# Patient Record
Sex: Female | Born: 1950
Health system: Southern US, Community
[De-identification: ages and names within clinical notes are randomized; demographics above are authoritative.]

## PROBLEM LIST (undated history)

## (undated) DIAGNOSIS — L659 Nonscarring hair loss, unspecified: Secondary | ICD-10-CM

## (undated) DIAGNOSIS — M778 Other enthesopathies, not elsewhere classified: Secondary | ICD-10-CM

## (undated) DIAGNOSIS — I6529 Occlusion and stenosis of unspecified carotid artery: Secondary | ICD-10-CM

## (undated) DIAGNOSIS — K219 Gastro-esophageal reflux disease without esophagitis: Secondary | ICD-10-CM

## (undated) DIAGNOSIS — Q2381 Bicuspid aortic valve: Secondary | ICD-10-CM

## (undated) DIAGNOSIS — Z853 Personal history of malignant neoplasm of breast: Secondary | ICD-10-CM

## (undated) DIAGNOSIS — I5032 Chronic diastolic (congestive) heart failure: Secondary | ICD-10-CM

## (undated) DIAGNOSIS — R011 Cardiac murmur, unspecified: Secondary | ICD-10-CM

## (undated) DIAGNOSIS — M25431 Effusion, right wrist: Secondary | ICD-10-CM

## (undated) DIAGNOSIS — E785 Hyperlipidemia, unspecified: Secondary | ICD-10-CM

## (undated) DIAGNOSIS — D649 Anemia, unspecified: Secondary | ICD-10-CM

## (undated) DIAGNOSIS — M25432 Effusion, left wrist: Secondary | ICD-10-CM

## (undated) DIAGNOSIS — Z171 Estrogen receptor negative status [ER-]: Secondary | ICD-10-CM

## (undated) DIAGNOSIS — R739 Hyperglycemia, unspecified: Secondary | ICD-10-CM

## (undated) DIAGNOSIS — I219 Acute myocardial infarction, unspecified: Secondary | ICD-10-CM

## (undated) DIAGNOSIS — M1990 Unspecified osteoarthritis, unspecified site: Secondary | ICD-10-CM

## (undated) DIAGNOSIS — E109 Type 1 diabetes mellitus without complications: Secondary | ICD-10-CM

## (undated) DIAGNOSIS — I10 Essential (primary) hypertension: Secondary | ICD-10-CM

## (undated) DIAGNOSIS — Z5189 Encounter for other specified aftercare: Secondary | ICD-10-CM

## (undated) DIAGNOSIS — I2510 Atherosclerotic heart disease of native coronary artery without angina pectoris: Secondary | ICD-10-CM

## (undated) DIAGNOSIS — I340 Nonrheumatic mitral (valve) insufficiency: Secondary | ICD-10-CM

## (undated) DIAGNOSIS — Q231 Congenital insufficiency of aortic valve: Secondary | ICD-10-CM

## (undated) DIAGNOSIS — E860 Dehydration: Secondary | ICD-10-CM

## (undated) HISTORY — PX: ELBOW SURGERY: SHX618

## (undated) HISTORY — DX: Occlusion and stenosis of unspecified carotid artery: I65.29

## (undated) HISTORY — DX: Hyperglycemia, unspecified: R73.9

## (undated) HISTORY — DX: Effusion, right wrist: M25.432

## (undated) HISTORY — DX: Congenital insufficiency of aortic valve: Q23.1

## (undated) HISTORY — PX: CHOLECYSTECTOMY: SHX55

## (undated) HISTORY — DX: Hyperlipidemia, unspecified: E78.5

## (undated) HISTORY — DX: Personal history of malignant neoplasm of breast: Z85.3

## (undated) HISTORY — DX: Other enthesopathies, not elsewhere classified: M77.8

## (undated) HISTORY — DX: Dehydration: E86.0

## (undated) HISTORY — PX: OTHER SURGICAL HISTORY: SHX169

## (undated) HISTORY — DX: Essential (primary) hypertension: I10

## (undated) HISTORY — PX: HAND SURGERY: SHX662

## (undated) HISTORY — PX: CATARACT EXTRACTION, BILATERAL: SHX1313

## (undated) HISTORY — DX: Effusion, right wrist: M25.431

## (undated) HISTORY — DX: Anemia, unspecified: D64.9

## (undated) HISTORY — DX: Estrogen receptor negative status (ER-): Z17.1

## (undated) HISTORY — DX: Bicuspid aortic valve: Q23.81

## (undated) HISTORY — DX: Cardiac murmur, unspecified: R01.1

## (undated) HISTORY — DX: Encounter for other specified aftercare: Z51.89

## (undated) HISTORY — PX: ANKLE SURGERY: SHX546

## (undated) HISTORY — DX: Nonrheumatic mitral (valve) insufficiency: I34.0

## (undated) HISTORY — DX: Nonscarring hair loss, unspecified: L65.9

## (undated) HISTORY — DX: Type 1 diabetes mellitus without complications: E10.9

## (undated) HISTORY — PX: PORTACATH PLACEMENT: SHX2246

## (undated) HISTORY — PX: PORT-A-CATH REMOVAL: SHX5289

## (undated) HISTORY — PX: CORONARY STENT INTERVENTION: CATH118234

---
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
## 2000-12-04 ENCOUNTER — Ambulatory Visit (HOSPITAL_BASED_OUTPATIENT_CLINIC_OR_DEPARTMENT_OTHER): Admission: RE | Admit: 2000-12-04 | Discharge: 2000-12-04 | Payer: Self-pay | Admitting: Orthopedic Surgery

## 2001-04-02 ENCOUNTER — Encounter: Payer: Self-pay | Admitting: Endocrinology

## 2001-04-02 ENCOUNTER — Encounter: Admission: RE | Admit: 2001-04-02 | Discharge: 2001-04-02 | Payer: Self-pay | Admitting: Endocrinology

## 2001-04-17 ENCOUNTER — Encounter: Payer: Self-pay | Admitting: General Surgery

## 2001-04-17 ENCOUNTER — Ambulatory Visit (HOSPITAL_COMMUNITY): Admission: RE | Admit: 2001-04-17 | Discharge: 2001-04-18 | Payer: Self-pay | Admitting: General Surgery

## 2001-04-17 ENCOUNTER — Encounter (INDEPENDENT_AMBULATORY_CARE_PROVIDER_SITE_OTHER): Payer: Self-pay | Admitting: *Deleted

## 2001-12-24 ENCOUNTER — Other Ambulatory Visit: Admission: RE | Admit: 2001-12-24 | Discharge: 2001-12-24 | Payer: Self-pay | Admitting: Obstetrics and Gynecology

## 2003-01-22 ENCOUNTER — Encounter: Admission: RE | Admit: 2003-01-22 | Discharge: 2003-01-22 | Payer: Self-pay | Admitting: Obstetrics and Gynecology

## 2003-01-22 ENCOUNTER — Encounter: Payer: Self-pay | Admitting: Obstetrics and Gynecology

## 2004-01-27 ENCOUNTER — Encounter: Admission: RE | Admit: 2004-01-27 | Discharge: 2004-01-27 | Payer: Self-pay | Admitting: Obstetrics and Gynecology

## 2005-06-28 ENCOUNTER — Encounter: Admission: RE | Admit: 2005-06-28 | Discharge: 2005-06-28 | Payer: Self-pay | Admitting: Endocrinology

## 2006-07-09 ENCOUNTER — Encounter: Admission: RE | Admit: 2006-07-09 | Discharge: 2006-07-09 | Payer: Self-pay | Admitting: Endocrinology

## 2009-12-30 ENCOUNTER — Encounter: Admission: RE | Admit: 2009-12-30 | Discharge: 2009-12-30 | Payer: Self-pay | Admitting: Endocrinology

## 2011-03-03 NOTE — Op Note (Signed)
[place]. [facility]  Patient:    Molly Hill, Molly Hill                       MRN: [id number] Proc. Date: 12/04/00 Adm. Date:  [id number] Attending:  Susa Day CC:         Alfonse Alpers. Dagoberto Ligas, M.D.   Operative Report  PREOPERATIVE DIAGNOSIS: 1. Entrapment neuropathy, median nerve, left carpal tunnel. 2. Left long finger A-1 pulley stenosing tenosynovitis.  POSTOPERATIVE DIAGNOSIS: 1. Entrapment neuropathy, median nerve, left carpal tunnel. 2. Left long finger A-1 pulley stenosing tenosynovitis.  OPERATION PERFORMED: 1. Release of left transverse carpal ligament. 2. Through separate incision, release of left long finger A-1 pulley.  OPERATING SURGEON:  Josephine Igo, M.D.  ASSISTANT:  Jonni Sanger, P.A.  ANESTHESIA:  IV regional.  ANESTHESIOLOGIST:  Dr. Krista Blue.  INDICATIONS:  Molly Hill is a 60 year old woman who has had insulin-dependent diabetes.  She was referred to Dr. Dagoberto Ligas for a consultation regarding chronic entrapment neuropathy symptoms of the left hand and stenosing tenosynovitis of the left long finger.  Due to failure of nonoperative measures, the patient is brought to the operating room at this time for release of her left transverse carpal ligament and release of her left long finger A-1 pulley.  DESCRIPTION OF PROCEDURE:  Molly Hill was brought to the operating room and placed in supine position on the operating table.  Following induction of general sedation, an IV regional block was placed.  When anesthesia was satisfactory, the left arm was prepped with Betadine soap and solution and sterilely draped.  The procedure commenced with a short transverse incision distal to the distal palmar creasein line overlying the A-1 pulley.  Subcutaneous tissues were carefully divided revealing the palmar fascia.  This was released with scissors followed by identification of the flexor sheath.  The neurovascular bundles  were gently retracted.  The A-1 pulley was released with scissors. Thereafter tendons were delivered and found to be moderately edematous.  Full range of motion of the fingers was recovered.  Attention was then directed to release of the median nerve.  A separate 15 mm incision was fashioned in line with the ring finger in the proximal palm. Subcutaneous tissues were carefully divided revealing the palmar fascia.  This was split longitudinally to reveal the common sensory branch of the median nerve.  These were followed back to the transverse carpal ligament which was carefully separated from the median nerve.  The ligament was released frozen section its ulnar border extending into the distal forearm.  This widely opened the carpal canal.  No masses or other predicaments were noted.  Bleeding points along the margin of the released ligament were electrocauterized with bipolar current followed by repair of the skin with intradermal 3-0 Prolene suture.  The A-1 pulley release incision was likewise repaired with intradermal 3-0 Prolene.  A compressive dressing was applied with Xeroflo, sterile gauze and a volar plaster splint was placed protecting the wrist.  There were no apparent complications. DD:  12/04/00 TD:  12/04/00 Job: [id number]

## 2011-03-03 NOTE — Op Note (Signed)
[place]. [facility]  Patient:    OSHA, RANE                       MRN: [id number] Proc. Date: 04/17/01 Adm. Date:  [id number] Attending:  Tempie Donning CC:         Alfonse Alpers. Dagoberto Ligas, M.D.   Operative Report  PROCEDURE:  Laparoscopic cholecystectomy.  SURGEON:  Gita Kudo, M.D.  ASSISTANT:  Currie Paris, M.D.  ANESTHESIA:  General endotracheal.  PREOPERATIVE DIAGNOSIS:  Cholelithiasis.  POSTOPERATIVE DIAGNOSIS:  Cholelithiasis.  CLINICAL SUMMARY:  60 year old diabetic female with a bout of right upper quadrant abdominal pain.  Gallbladder ultrasound showed stones, but otherwise normal and no evidence of acute infection.  Liver function studies are normal. She comes in for elective cholecystectomy.  OPERATIVE FINDINGS:  The gallbladder had some adhesions around it, signifying she has had problems in the past.  The cystic duct and artery were normal in size and anatomy.  The gallbladder was thin-walled and had multiple palpable small stones - felt after it was removed.  OPERATIVE PROCEDURE:  Under satisfactory general endotracheal anesthesia, having received 1.0 gram Ancef preoperatively, the patients abdomen was prepped and draped in a standard fashion.  A transverse incision was made above the umbilicus and the midline opened into the peritoneum.  Controlled with a figure-of-eight 0 Vicryl and Hasson operating port inserted and secured.  Good CO2 pneumoperitoneum established and camera placed.  Under direct vision, through Marcaine infiltrated skin sites, two #5 ports were placed laterally and a second #10 port medically.  With graspers in the lateral port giving excellent exposure, I took down the adhesions operating through the medial port using the coagulating dissector.  Then the cystic duct and artery were each carefully circumferentially dissected with a right angle clamp and when certain of the anatomy, controlled  with multiple metal clips and divided between the distal two.  The gallbladder was then removed from below upward using the coagulating spatula for hemostasis and dissection. After removing it from the liver bed, the bed was made dry by cautery, lavaged with saline, and suctioned dry.  The camera was then moved to the upper port and then grasper placed in the umbilical port used to retrieve the gallbladder intact without any spillage or complication.  After it was removed, the operative site was checked for hemostasis and then all ports and CO2 were released.  The midline incision was then infiltrated with Marcaine and closed with the previous suture and a second interrupted 0 Vicryl suture.  Subcu approximated with 4-0 Vicryl and skin edges with Steri-Strips.  Sterile dressings applied and the patient to the recovery room from the operating in good condition. DD:  04/17/01 TD:  04/17/01 Job: [id number]

## 2011-10-17 DIAGNOSIS — I2510 Atherosclerotic heart disease of native coronary artery without angina pectoris: Secondary | ICD-10-CM | POA: Insufficient documentation

## 2011-10-17 HISTORY — DX: Atherosclerotic heart disease of native coronary artery without angina pectoris: I25.10

## 2019-04-23 ENCOUNTER — Other Ambulatory Visit (HOSPITAL_COMMUNITY): Payer: Self-pay | Admitting: Family Medicine

## 2019-04-23 DIAGNOSIS — R011 Cardiac murmur, unspecified: Secondary | ICD-10-CM

## 2019-04-28 ENCOUNTER — Ambulatory Visit (HOSPITAL_COMMUNITY): Admission: RE | Admit: 2019-04-28 | Payer: Medicare PPO | Source: Ambulatory Visit

## 2019-04-29 ENCOUNTER — Ambulatory Visit (HOSPITAL_COMMUNITY)
Admission: RE | Admit: 2019-04-29 | Discharge: 2019-04-29 | Disposition: A | Discharge status: Home / Self Care | Payer: Medicare PPO | Source: Ambulatory Visit | Attending: Family Medicine | Admitting: Family Medicine

## 2019-04-29 ENCOUNTER — Other Ambulatory Visit: Payer: Self-pay

## 2019-04-29 DIAGNOSIS — I080 Rheumatic disorders of both mitral and aortic valves: Secondary | ICD-10-CM | POA: Diagnosis not present

## 2019-04-29 DIAGNOSIS — R011 Cardiac murmur, unspecified: Secondary | ICD-10-CM | POA: Diagnosis present

## 2019-04-29 NOTE — Progress Notes (Signed)
  Echocardiogram 2D Echocardiogram has been performed.  Randa Lynn Saivon Prowse 04/29/2019, 12:32 PM

## 2019-05-08 ENCOUNTER — Telehealth: Payer: Self-pay

## 2019-05-08 NOTE — Telephone Encounter (Signed)
NOTES ON FILE FROM DR Sutton, NOTES ON FILE

## 2019-05-21 ENCOUNTER — Telehealth: Payer: Self-pay

## 2019-05-21 NOTE — Telephone Encounter (Signed)
NOTES ON FILE FROM MICHAEL SCOTT MCLEOD [phone] SENT REFERRAL TO [place]

## 2019-07-09 NOTE — Progress Notes (Signed)
Cardiology Office Note    Date:  07/10/2019   ID:  Molly Hill, DOB 06-Oct-1951, MRN [id number]  PCP:  Allean Found, MD  Cardiologist:  Fransico Him, MD   Chief Complaint  Patient presents with  . New Patient (Initial Visit)    MR, BAV with mild to moderate AR and mild AS    History of Present Illness:  Molly Hill is a 68 y.o. female who is being seen today for the evaluation of mitral regurgitation at the request of Allean Found, MD.  This is a pleasant 68yo female with a hx of HTN, HLD, ASCAD s/p remote PCI of LAD in 2013 and heart murmur.  Her PCP recently ordered an echo to followup on her murmur showing normal LVF with EF 60-65% with G2DD, mild LAE, degenerative thickening of the MV with mild to moderate MR and bicuspid AV with mild to moderate AR and mild AS.    She denies any anginal chest pain or pressure,  PND, orthopnea, LE edema, dizziness, palpitations or syncope. She says that ever since her breast surgery she gets an occasional sharp pain in her chest lasting < 15 seconds with no associated sx.  She does get DOE when walking up steep inclines that is a chronic problem and has not changed recently.  She is compliant with her meds and is tolerating meds with no SE.   Past Medical History:  Diagnosis Date  . Alopecia   . Anemia   . Bicuspid aortic valve    mild to moderate AR and mild AS by echo 2020  . Carotid bruit   . Convalescence after chemotherapy   . Coronary arteriosclerosis in native artery 2013   s/p PCI of LAD  . DM (diabetes mellitus), type 1 ([place])   . Estrogen receptor negative   . Hyperglycemia   . Hyperlipidemia   . Hypertension   . Mild dehydration   . Mitral regurgitation    mild to moderate by echo 2020  . Murmur, heart   . Swelling of joint of both wrists   . Tendinitis of left wrist       Current Medications: Current Meds  Medication Sig  . aspirin EC 81 MG tablet Take 81 mg by mouth daily.  Marland Kitchen atorvastatin (LIPITOR) 40  MG tablet Take 40 mg by mouth daily.  . Biotin w/ Vitamins C & E (HAIR/SKIN/NAILS PO) Take by mouth.  . Calcium Carb-Cholecalciferol (CALCIUM 600 + D PO) Take by mouth.  . docusate sodium (COLACE) 100 MG capsule Take 100 mg by mouth daily as needed for mild constipation.  . fexofenadine (ALLEGRA) 180 MG tablet Take 180 mg by mouth daily.  . fluticasone (FLONASE) 50 MCG/ACT nasal spray Place into both nostrils daily.  Marland Kitchen glucose blood test strip 1 each by Other route as needed for other. Use as instructed  . insulin NPH Human (NOVOLIN N) 100 UNIT/ML injection Inject into the skin.  Marland Kitchen insulin regular (NOVOLIN R) 100 units/mL injection Inject 100 Units into the skin 3 (three) times daily before meals.  Marland Kitchen loperamide (IMODIUM) 1 MG/5ML solution Take by mouth as needed for diarrhea or loose stools.  . Metoprolol Succinate 25 MG CS24 Take 25 mg by mouth.  . nitroGLYCERIN (NITROSTAT) 0.4 MG SL tablet Place 0.4 mg under the tongue every 5 (five) minutes as needed for chest pain.  . ramipril (ALTACE) 10 MG capsule Take 10 mg by mouth daily.  . traZODone (DESYREL) 50 MG tablet Take  50 mg by mouth 3 (three) times daily.    Allergies:   Codeine and Reglan [metoclopramide]   Social History   Socioeconomic History  . Marital status: Married    Spouse name: Not on file  . Number of children: Not on file  . Years of education: Not on file  . Highest education level: Not on file  Occupational History  . Not on file  Social Needs  . Financial resource strain: Not on file  . Food insecurity    Worry: Not on file    Inability: Not on file  . Transportation needs    Medical: Not on file    Non-medical: Not on file  Tobacco Use  . Smoking status: Former Research scientist (life sciences)  . Smokeless tobacco: Never Used  Substance and Sexual Activity  . Alcohol use: Never    Frequency: Never  . Drug use: Never  . Sexual activity: Never  Lifestyle  . Physical activity    Days per week: Not on file    Minutes per session:  Not on file  . Stress: Not on file  Relationships  . Social Herbalist on phone: Not on file    Gets together: Not on file    Attends religious service: Not on file    Active member of club or organization: Not on file    Attends meetings of clubs or organizations: Not on file    Relationship status: Not on file  Other Topics Concern  . Not on file  Social History Narrative  . Not on file     Family History:  The patient's family history includes CAD in her father; Diabetes in her brother, father, and paternal grandfather; Heart attack in her mother and paternal grandfather; Stroke in her father.   ROS:   Please see the history of present illness.    ROS All other systems reviewed and are negative.  No flowsheet data found.     PHYSICAL EXAM:   VS:  BP (!) 183/71   Pulse 66   Ht 5' (1.524 m)   Wt 146 lb (66.2 kg)   BMI 28.51 kg/m    GEN: Well nourished, well developed, in no acute distress  HEENT: normal  Neck: no JVD, carotid bruits, or masses Cardiac: RRR; no murmurs, rubs, or gallops,no edema.  Intact distal pulses bilaterally.  Respiratory:  clear to auscultation bilaterally, normal work of breathing GI: soft, nontender, nondistended, + BS MS: no deformity or atrophy  Skin: warm and dry, no Duran Neuro:  Alert and Oriented x 3, Strength and sensation are intact Psych: euthymic mood, full affect  Wt Readings from Last 3 Encounters:  07/10/19 146 lb (66.2 kg)      Studies/Labs Reviewed:   EKG:  EKG is not ordered today.    Recent Labs: No results found for requested labs within last 8760 hours.   Lipid Panel No results found for: CHOL, TRIG, HDL, CHOLHDL, VLDL, LDLCALC, LDLDIRECT  Additional studies/ records that were reviewed today include:  2D echo     ASSESSMENT:    1. Nonrheumatic mitral valve regurgitation   2. Bicuspid aortic valve   3.      ASCAD 4.      HLD 5.      HTN 6.      Left carotid bruit   PLAN:  In order of  problems listed above:  1.  Mitral regurgitation -2D echo showed mild to moderate MR -LV size and  function are normal -will follow with yearly echo  2.  Bicuspid AV -echo showed mild AS and mild to moderate AR -will check Chest CTA to rule out associated aortic aneurysms which can be seen with BAV -follow with yearly echo to assess for stability of AR and AS -LV dimensions and LVF are normal.   3.  ASCAD -s/p PCI of LAD in 2013 -continue ASA 81mg  daily, statin, BB  4.  Hyperlipidemia -LDL goal is < 70 -continue atorvastatin 40mg  daily -I will get a copy of last FLP and ALT from PCP  5.  HTN -Bp controlled on exam -continue Toprol XL 25mg  daily and Altace 10mg  daily  6.  Left carotid artery bruit -this does not appear to emanate from her AV -check carotid dopplers    Medication Adjustments/Labs and Tests Ordered: Current medicines are reviewed at length with the patient today.  Concerns regarding medicines are outlined above.  Medication changes, Labs and Tests ordered today are listed in the Patient Instructions below.  Patient Instructions  Medication Instructions:  Your physician recommends that you continue on your current medications as directed. Please refer to the Current Medication list given to you today.  If you need a refill on your cardiac medications before your next appointment, please call your pharmacy.   Lab work: Your physician recommends that you return for lab work If you have labs (blood work) drawn today and your tests are completely normal, you will receive your results only by: Marland Kitchen MyChart Message (if you have MyChart) OR . A paper copy in the mail If you have any lab test that is abnormal or we need to change your treatment, we will call you to review the results.  Testing/Procedures: Your physician has requested that you have a carotid duplex. This test is an ultrasound of the carotid arteries in your neck. It looks at blood flow through these  arteries that supply the brain with blood. Allow one hour for this exam. There are no restrictions or special instructions.  Your physician has requested that you have an echocardiogram. Echocardiography is a painless test that uses sound waves to create images of your heart. It provides your doctor with information about the size and shape of your heart and how well your heart's chambers and valves are working. This procedure takes approximately one hour. There are no restrictions for this procedure.  Non-Cardiac CT Angiography (CTA), is a special type of CT scan that uses a computer to produce multi-dimensional views of major blood vessels throughout the body. In CT angiography, a contrast material is injected through an IV to help visualize the blood vessels  Follow-Up: At [facility], you and your health needs are our priority.  As part of our continuing mission to provide you with exceptional heart care, we have created designated Provider Care Teams.  These Care Teams include your primary Cardiologist (physician) and Advanced Practice Providers (APPs -  Physician Assistants and Nurse Practitioners) who all work together to provide you with the care you need, when you need it. You will need a follow up appointment in 1 years.  Please call our office 2 months in advance to schedule this appointment.  You may see Dr. Radford Pax or one of the following Advanced Practice Providers on your designated Care Team:   [place], PA-C Melina Copa, PA-C . Ermalinda Barrios, PA-C  Any Other Special Instructions Will Be Listed [place] Echocardiogram An echocardiogram is a procedure that uses painless sound waves (ultrasound) to  produce an image of the heart. Images from an echocardiogram can provide important information about:  Signs of coronary artery disease (CAD).  Aneurysm detection. An aneurysm is a weak or damaged part of an artery wall that bulges out from the normal force of blood pumping through  the body.  Heart size and shape. Changes in the size or shape of the heart can be associated with certain conditions, including heart failure, aneurysm, and CAD.  Heart muscle function.  Heart valve function.  Signs of a past heart attack.  Fluid buildup around the heart.  Thickening of the heart muscle.  A tumor or infectious growth around the heart valves. Tell a health care provider about:  Any allergies you have.  All medicines you are taking, including vitamins, herbs, eye drops, creams, and over-the-counter medicines.  Any blood disorders you have.  Any surgeries you have had.  Any medical conditions you have.  Whether you are pregnant or may be pregnant. What are the risks? Generally, this is a safe procedure. However, problems may occur, including:  Allergic reaction to dye (contrast) that may be used during the procedure. What happens before the procedure? No specific preparation is needed. You may eat and drink normally. What happens during the procedure?   An IV tube may be inserted into one of your veins.  You may receive contrast through this tube. A contrast is an injection that improves the quality of the pictures from your heart.  A gel will be applied to your chest.  A wand-like tool (transducer) will be moved over your chest. The gel will help to transmit the sound waves from the transducer.  The sound waves will harmlessly bounce off of your heart to allow the heart images to be captured in real-time motion. The images will be recorded on a computer. The procedure may vary among health care providers and hospitals. What happens after the procedure?  You may return to your normal, everyday life, including diet, activities, and medicines, unless your health care provider tells you not to do that. Summary  An echocardiogram is a procedure that uses painless sound waves (ultrasound) to produce an image of the heart.  Images from an echocardiogram  can provide important information about the size and shape of your heart, heart muscle function, heart valve function, and fluid buildup around your heart.  You do not need to do anything to prepare before this procedure. You may eat and drink normally.  After the echocardiogram is completed, you may return to your normal, everyday life, unless your health care provider tells you not to do that. This information is not intended to replace advice given to you by your health care provider. Make sure you discuss any questions you have with your health care provider. Document Released: 09/29/2000 Document Revised: 01/23/2019 Document Reviewed: 11/04/2016 Elsevier Patient Education  2020 [place] (If Applicable).         Signed, Fransico Him, MD  07/10/2019 11:02 AM    [facility] [address], [address], [place]  [address] Phone: [phone]; Fax: [phone]

## 2019-07-10 ENCOUNTER — Encounter: Payer: Self-pay | Admitting: Cardiology

## 2019-07-10 ENCOUNTER — Ambulatory Visit (INDEPENDENT_AMBULATORY_CARE_PROVIDER_SITE_OTHER): Payer: Medicare PPO | Admitting: Cardiology

## 2019-07-10 ENCOUNTER — Other Ambulatory Visit: Payer: Self-pay

## 2019-07-10 ENCOUNTER — Telehealth: Payer: Self-pay

## 2019-07-10 VITALS — BP 183/71 | HR 66 | Ht 60.0 in | Wt 146.0 lb

## 2019-07-10 DIAGNOSIS — R0989 Other specified symptoms and signs involving the circulatory and respiratory systems: Secondary | ICD-10-CM | POA: Diagnosis not present

## 2019-07-10 DIAGNOSIS — Q231 Congenital insufficiency of aortic valve: Secondary | ICD-10-CM | POA: Diagnosis not present

## 2019-07-10 DIAGNOSIS — I350 Nonrheumatic aortic (valve) stenosis: Secondary | ICD-10-CM | POA: Diagnosis not present

## 2019-07-10 DIAGNOSIS — I340 Nonrheumatic mitral (valve) insufficiency: Secondary | ICD-10-CM | POA: Diagnosis not present

## 2019-07-10 DIAGNOSIS — Q254 Congenital malformation of aorta unspecified: Secondary | ICD-10-CM

## 2019-07-10 DIAGNOSIS — I2510 Atherosclerotic heart disease of native coronary artery without angina pectoris: Secondary | ICD-10-CM

## 2019-07-10 NOTE — Patient Instructions (Addendum)
Medication Instructions:  Your physician recommends that you continue on your current medications as directed. Please refer to the Current Medication list given to you today.  If you need a refill on your cardiac medications before your next appointment, please call your pharmacy.   Lab work: Your physician recommends that you return for lab work within 1 week of Cardiac CTA If you have labs (blood work) drawn today and your tests are completely normal, you will receive your results only by:  [place] (if you have MyChart) OR  A paper copy in the mail If you have any lab test that is abnormal or we need to change your treatment, we will call you to review the results.  Testing/Procedures: Your physician has requested that you have a carotid duplex. This test is an ultrasound of the carotid arteries in your neck. It looks at blood flow through these arteries that supply the brain with blood. Allow one hour for this exam. There are no restrictions or special instructions.  Your physician has requested that you have an echocardiogram. Echocardiography is a painless test that uses sound waves to create images of your heart. It provides your doctor with information about the size and shape of your heart and how well your hearts chambers and valves are working. This procedure takes approximately one hour. There are no restrictions for this procedure.  Non-Cardiac CT Angiography (CTA), is a special type of CT scan that uses a computer to produce multi-dimensional views of major blood vessels throughout the body. In CT angiography, a contrast material is injected through an IV to help visualize the blood vessels  Follow-Up: At [facility], you and your health needs are our priority.  As part of our continuing mission to provide you with exceptional heart care, we have created designated Provider Care Teams.  These Care Teams include your primary Cardiologist (physician) and Advanced Practice  Providers (APPs -  Physician Assistants and Nurse Practitioners) who all work together to provide you with the care you need, when you need it. You will need a follow up appointment in 1 years.  Please call our office 2 months in advance to schedule this appointment.  You may see Dr. Radford Pax or one of the following Advanced Practice Providers on your designated Care Team:   Lyda Jester, PA-C Dayna Dunn, PA-C  Ermalinda Barrios, PA-C  Any Other Special Instructions Will Be [facility] Echocardiogram An echocardiogram is a procedure that uses painless sound waves (ultrasound) to produce an image of the heart. Images from an echocardiogram can provide important information about:  Signs of coronary artery disease (CAD).  Aneurysm detection. An aneurysm is a weak or damaged part of an artery wall that bulges out from the normal force of blood pumping through the body.  Heart size and shape. Changes in the size or shape of the heart can be associated with certain conditions, including heart failure, aneurysm, and CAD.  Heart muscle function.  Heart valve function.  Signs of a past heart attack.  Fluid buildup around the heart.  Thickening of the heart muscle.  A tumor or infectious growth around the heart valves. Tell a health care provider about:  Any allergies you have.  All medicines you are taking, including vitamins, herbs, eye drops, creams, and over-the-counter medicines.  Any blood disorders you have.  Any surgeries you have had.  Any medical conditions you have.  Whether you are pregnant or may be pregnant. What are the risks? Generally, this  is a safe procedure. However, problems may occur, including:  Allergic reaction to dye (contrast) that may be used during the procedure. What happens before the procedure? No specific preparation is needed. You may eat and drink normally. What happens during the procedure?   An IV tube may be inserted into one of your  veins.  You may receive contrast through this tube. A contrast is an injection that improves the quality of the pictures from your heart.  A gel will be applied to your chest.  A wand-like tool (transducer) will be moved over your chest. The gel will help to transmit the sound waves from the transducer.  The sound waves will harmlessly bounce off of your heart to allow the heart images to be captured in real-time motion. The images will be recorded on a computer. The procedure may vary among health care providers and hospitals. What happens after the procedure?  You may return to your normal, everyday life, including diet, activities, and medicines, unless your health care provider tells you not to do that. Summary  An echocardiogram is a procedure that uses painless sound waves (ultrasound) to produce an image of the heart.  Images from an echocardiogram can provide important information about the size and shape of your heart, heart muscle function, heart valve function, and fluid buildup around your heart.  You do not need to do anything to prepare before this procedure. You may eat and drink normally.  After the echocardiogram is completed, you may return to your normal, everyday life, unless your health care provider tells you not to do that. This information is not intended to replace advice given to you by your health care provider. Make sure you discuss any questions you have with your health care provider. Document Released: 09/29/2000 Document Revised: 01/23/2019 Document Reviewed: 11/04/2016 Elsevier Patient Education  2020 [place] (If Applicable).

## 2019-07-10 NOTE — Telephone Encounter (Signed)
Requested copy of pt's most recent  labwork be faxed to our office. Most recent labwork from 04/2019 will be sent/faxed to our office.

## 2019-07-23 ENCOUNTER — Other Ambulatory Visit: Payer: Self-pay

## 2019-07-23 ENCOUNTER — Telehealth: Payer: Self-pay

## 2019-07-23 ENCOUNTER — Encounter: Payer: Self-pay | Admitting: Cardiology

## 2019-07-23 ENCOUNTER — Ambulatory Visit (HOSPITAL_COMMUNITY)
Admission: RE | Admit: 2019-07-23 | Discharge: 2019-07-23 | Disposition: A | Discharge status: Home / Self Care | Payer: Medicare PPO | Source: Ambulatory Visit | Attending: Cardiology | Admitting: Cardiology

## 2019-07-23 DIAGNOSIS — R0989 Other specified symptoms and signs involving the circulatory and respiratory systems: Secondary | ICD-10-CM | POA: Diagnosis present

## 2019-07-23 DIAGNOSIS — I6523 Occlusion and stenosis of bilateral carotid arteries: Secondary | ICD-10-CM

## 2019-07-23 DIAGNOSIS — I6529 Occlusion and stenosis of unspecified carotid artery: Secondary | ICD-10-CM | POA: Insufficient documentation

## 2019-07-23 NOTE — Telephone Encounter (Signed)
-----   Message from Sueanne Margarita, MD sent at 07/23/2019 10:52 AM EDT ----- Please let patient know that dopplers showed 1-39% stenosis - repeat study in 2 years.  Continue ASA and statin

## 2019-07-23 NOTE — Telephone Encounter (Signed)
Notes recorded by Frederik Schmidt, RN on 07/23/2019 at 11:06 AM EDT  The patient has been notified of the Carotid US result and verbalized understanding. All questions (if any) were answered.  Frederik Schmidt, RN 07/23/2019 11:06 AM

## 2019-07-29 ENCOUNTER — Other Ambulatory Visit: Payer: Medicare PPO

## 2019-07-29 ENCOUNTER — Other Ambulatory Visit: Payer: Self-pay

## 2019-07-29 LAB — BASIC METABOLIC PANEL
BUN/Creatinine Ratio: 20 (ref 12–28)
BUN: 21 mg/dL (ref 8–27)
CO2: 23 mmol/L (ref 20–29)
Calcium: 9.2 mg/dL (ref 8.7–10.3)
Chloride: 97 mmol/L (ref 96–106)
Creatinine, Ser: 1.07 mg/dL — ABNORMAL HIGH (ref 0.57–1.00)
GFR calc Af Amer: 62 mL/min/{1.73_m2} (ref >59)
GFR calc non Af Amer: 53 mL/min/{1.73_m2} — ABNORMAL LOW (ref >59)
Glucose: 285 mg/dL — ABNORMAL HIGH (ref 65–99)
Potassium: 4.9 mmol/L (ref 3.5–5.2)
Sodium: 136 mmol/L (ref 134–144)

## 2019-08-05 ENCOUNTER — Other Ambulatory Visit: Payer: Self-pay

## 2019-08-05 ENCOUNTER — Telehealth: Payer: Self-pay | Admitting: Cardiology

## 2019-08-05 ENCOUNTER — Ambulatory Visit (INDEPENDENT_AMBULATORY_CARE_PROVIDER_SITE_OTHER)
Admission: RE | Admit: 2019-08-05 | Discharge: 2019-08-05 | Disposition: A | Discharge status: Home / Self Care | Payer: Medicare PPO | Source: Ambulatory Visit | Attending: Cardiology | Admitting: Cardiology

## 2019-08-05 DIAGNOSIS — Q254 Congenital malformation of aorta unspecified: Secondary | ICD-10-CM

## 2019-08-05 DIAGNOSIS — Q231 Congenital insufficiency of aortic valve: Secondary | ICD-10-CM | POA: Diagnosis not present

## 2019-08-05 MED ORDER — IOHEXOL 350 MG/ML SOLN
100.0000 mL | Freq: Once | INTRAVENOUS | Status: AC | PRN
  Administered 2019-08-05: 100 mL via INTRAVENOUS

## 2019-08-05 NOTE — Telephone Encounter (Signed)
New message   Patient is returning call for CT Angio test results. Please call.

## 2019-08-05 NOTE — Telephone Encounter (Signed)
Pt has been notified of CT results by phone with verbal understanding. Pt aware to f/u with PCP in regards to 25mm pulmonary nodule in RUL. I will fax results to PCP Dr. Allean Found. The patient has been notified of the result and verbalized understanding.  All questions (if any) were answered. Julaine Hua, [place] 08/05/2019 11:53 AM

## 2019-09-04 ENCOUNTER — Emergency Department (HOSPITAL_COMMUNITY)
Admission: EM | Admit: 2019-09-04 | Discharge: 2019-09-05 | Disposition: A | Discharge status: Home / Self Care | Payer: Medicare PPO | Attending: Emergency Medicine | Admitting: Emergency Medicine

## 2019-09-04 ENCOUNTER — Emergency Department (HOSPITAL_COMMUNITY): Payer: Medicare PPO

## 2019-09-04 DIAGNOSIS — R0602 Shortness of breath: Secondary | ICD-10-CM | POA: Insufficient documentation

## 2019-09-04 DIAGNOSIS — E162 Hypoglycemia, unspecified: Secondary | ICD-10-CM

## 2019-09-04 DIAGNOSIS — I2510 Atherosclerotic heart disease of native coronary artery without angina pectoris: Secondary | ICD-10-CM | POA: Diagnosis not present

## 2019-09-04 DIAGNOSIS — R079 Chest pain, unspecified: Secondary | ICD-10-CM

## 2019-09-04 DIAGNOSIS — E11649 Type 2 diabetes mellitus with hypoglycemia without coma: Secondary | ICD-10-CM | POA: Insufficient documentation

## 2019-09-04 DIAGNOSIS — Z7982 Long term (current) use of aspirin: Secondary | ICD-10-CM | POA: Insufficient documentation

## 2019-09-04 DIAGNOSIS — I10 Essential (primary) hypertension: Secondary | ICD-10-CM | POA: Insufficient documentation

## 2019-09-04 DIAGNOSIS — Z87891 Personal history of nicotine dependence: Secondary | ICD-10-CM | POA: Diagnosis not present

## 2019-09-04 DIAGNOSIS — R0789 Other chest pain: Secondary | ICD-10-CM | POA: Insufficient documentation

## 2019-09-04 DIAGNOSIS — Z79899 Other long term (current) drug therapy: Secondary | ICD-10-CM | POA: Insufficient documentation

## 2019-09-04 DIAGNOSIS — Z794 Long term (current) use of insulin: Secondary | ICD-10-CM | POA: Insufficient documentation

## 2019-09-04 LAB — CBC WITH DIFFERENTIAL/PLATELET
Abs Immature Granulocytes: 0.06 10*3/uL (ref 0.00–0.07)
Basophils Absolute: 0 10*3/uL (ref 0.0–0.1)
Basophils Relative: 0 %
Eosinophils Absolute: 0.1 10*3/uL (ref 0.0–0.5)
Eosinophils Relative: 1 %
HCT: 40.2 % (ref 36.0–46.0)
Hemoglobin: 13.3 g/dL (ref 12.0–15.0)
Immature Granulocytes: 0 %
Lymphocytes Relative: 4 %
Lymphs Abs: 0.6 10*3/uL — ABNORMAL LOW (ref 0.7–4.0)
MCH: 31.5 pg (ref 26.0–34.0)
MCHC: 33.1 g/dL (ref 30.0–36.0)
MCV: 95.3 fL (ref 80.0–100.0)
Monocytes Absolute: 1 10*3/uL (ref 0.1–1.0)
Monocytes Relative: 7 %
Neutro Abs: 13 10*3/uL — ABNORMAL HIGH (ref 1.7–7.7)
Neutrophils Relative %: 88 %
Platelets: 192 10*3/uL (ref 150–400)
RBC: 4.22 MIL/uL (ref 3.87–5.11)
RDW: 12.2 % (ref 11.5–15.5)
WBC: 14.8 10*3/uL — ABNORMAL HIGH (ref 4.0–10.5)
nRBC: 0 % (ref 0.0–0.2)

## 2019-09-04 LAB — COMPREHENSIVE METABOLIC PANEL
ALT: 57 U/L — ABNORMAL HIGH (ref 0–44)
AST: 152 U/L — ABNORMAL HIGH (ref 15–41)
Albumin: 4.2 g/dL (ref 3.5–5.0)
Alkaline Phosphatase: 90 U/L (ref 38–126)
Anion gap: 8 (ref 5–15)
BUN: 27 mg/dL — ABNORMAL HIGH (ref 8–23)
CO2: 29 mmol/L (ref 22–32)
Calcium: 9.1 mg/dL (ref 8.9–10.3)
Chloride: 100 mmol/L (ref 98–111)
Creatinine, Ser: 0.99 mg/dL (ref 0.44–1.00)
GFR calc Af Amer: >60 mL/min (ref >60)
GFR calc non Af Amer: 58 mL/min — ABNORMAL LOW (ref >60)
Glucose, Bld: 80 mg/dL (ref 70–99)
Potassium: 4 mmol/L (ref 3.5–5.1)
Sodium: 137 mmol/L (ref 135–145)
Total Bilirubin: 0.9 mg/dL (ref 0.3–1.2)
Total Protein: 7.3 g/dL (ref 6.5–8.1)

## 2019-09-04 LAB — CBG MONITORING, ED: Glucose-Capillary: 111 mg/dL — ABNORMAL HIGH (ref 70–99)

## 2019-09-04 LAB — LIPASE, BLOOD: Lipase: 246 U/L — ABNORMAL HIGH (ref 11–51)

## 2019-09-04 LAB — TROPONIN I (HIGH SENSITIVITY): Troponin I (High Sensitivity): 12 ng/L (ref <18)

## 2019-09-04 NOTE — ED Triage Notes (Signed)
Pt came in [facility] c/o of hypoglycemia. When EMS arrived, BGL read "low" and 1g D10 was given. Rechecked BGL showed 302. Pt is A/ox4. Pt experienced 2 episodes of vomiting in route. Complained of CP and SOB in route.  Last BGL obtained =192 72HR, 138/72, 94%RA

## 2019-09-04 NOTE — ED Provider Notes (Addendum)
[facility] EMERGENCY DEPARTMENT Provider Note   CSN: [id number] Arrival date & time: 09/04/19  2122     History   Chief Complaint Chief Complaint  Patient presents with  . Hypoglycemia    HPI Molly Hill is a 68 y.o. female with past medical history of insulin-dependent diabetes, hypertension, CAD, who presents today for evaluation of hypoglycemia.  When EMS arrived her blood sugar simply read low and she was given 1 g of D10.  Recheck blood glucose level was 203.  After this patient had 2 episodes of chest pain, shortness of breath with pressure and vomiting.  She reports that this has resolved at this time.  She states that she had to fast overnight last night however ate a high carb lunch.  She had not yet eaten dinner or giving herself her insulin.  She does not have a clear cause for why she became hypoglycemic.  She states that she did not accidentally take insulin more than needed or did not take insulin without eating.        HPI  Past Medical History:  Diagnosis Date  . Alopecia   . Anemia   . Bicuspid aortic valve    mild to moderate AR and mild AS by echo 2020  . Carotid stenosis    1-39% stenosis bilaterally by dopplers 07/2019  . Convalescence after chemotherapy   . Coronary arteriosclerosis in native artery 2013   s/p PCI of LAD  . DM (diabetes mellitus), type 1 ([place])   . Estrogen receptor negative   . Hyperglycemia   . Hyperlipidemia   . Hypertension   . Mild dehydration   . Mitral regurgitation    mild to moderate by echo 2020  . Murmur, heart   . Swelling of joint of both wrists   . Tendinitis of left wrist     Patient Active Problem List   Diagnosis Date Noted  . Carotid stenosis   . Mitral regurgitation   . Bicuspid aortic valve   . Carotid bruit   . Coronary arteriosclerosis in native artery 2013    Past Surgical History:  Procedure Laterality Date  . ANKLE SURGERY Bilateral   . CATARACT EXTRACTION, BILATERAL     . CHOLECYSTECTOMY    . CORONARY STENT INTERVENTION    . CTR    . ELBOW SURGERY    . HAND SURGERY     BOTH THUMBS AND MIDDLE FINGERS  . LEFT BREAST MASTECTOMY    . MASCULAR  REPAIR    . PORT-A-CATH REMOVAL    . PORTACATH PLACEMENT    . RIGHT MODIFIED MASTECTOMY Right   . UPPER LIP SURGERY       OB History   No obstetric history on file.      Home Medications    Prior to Admission medications   Medication Sig Start Date End Date Taking? Authorizing Provider  aspirin EC 81 MG tablet Take 81 mg by mouth daily.    [provider]  atorvastatin (LIPITOR) 40 MG tablet Take 40 mg by mouth daily.    [provider]  Biotin w/ Vitamins C & E (HAIR/SKIN/NAILS PO) Take by mouth.    [provider]  Calcium Carb-Cholecalciferol (CALCIUM 600 + D PO) Take by mouth.    [provider]  docusate sodium (COLACE) 100 MG capsule Take 100 mg by mouth daily as needed for mild constipation.    [provider]  fexofenadine (ALLEGRA) 180 MG tablet Take  180 mg by mouth daily.    [provider]  fluticasone (FLONASE) 50 MCG/ACT nasal spray Place into both nostrils daily.    [provider]  glucose blood test strip 1 each by Other route as needed for other. Use as instructed    [provider]  insulin NPH Human (NOVOLIN N) 100 UNIT/ML injection Inject into the skin.    [provider]  insulin regular (NOVOLIN R) 100 units/mL injection Inject 100 Units into the skin 3 (three) times daily before meals.    [provider]  loperamide (IMODIUM) 1 MG/5ML solution Take by mouth as needed for diarrhea or loose stools.    [provider]  Metoprolol Succinate 25 MG CS24 Take 25 mg by mouth.    [provider]  nitroGLYCERIN (NITROSTAT) 0.4 MG SL tablet Place 0.4 mg under the tongue every 5 (five) minutes as needed for chest pain.    [provider]  ramipril (ALTACE) 10 MG capsule Take 10 mg by  mouth daily.    [provider]  traZODone (DESYREL) 50 MG tablet Take 50 mg by mouth 3 (three) times daily.    [provider]    Family History Family History  Problem Relation Age of Onset  . Heart attack Mother   . CAD Father        CABG  . Diabetes Father   . Stroke Father   . Diabetes Brother   . Diabetes Paternal Grandfather   . Heart attack Paternal Grandfather     Social History Social History   Tobacco Use  . Smoking status: Former Research scientist (life sciences)  . Smokeless tobacco: Never Used  Substance Use Topics  . Alcohol use: Never    Frequency: Never  . Drug use: Never     Allergies   Codeine and Reglan [metoclopramide]   Review of Systems Review of Systems  Constitutional: Negative for chills and fever.  HENT: Negative for congestion.   Respiratory: Positive for chest tightness and shortness of breath.   Cardiovascular: Positive for chest pain. Negative for palpitations and leg swelling.  Gastrointestinal: Positive for nausea and vomiting. Negative for abdominal pain.  Musculoskeletal: Negative for back pain.  Skin: Negative for color change, Devivo and wound.  Neurological: Negative for weakness and headaches.       Unconsciousness, resolved with treatment of hypoglycemia.  All other systems reviewed and are negative.    Physical Exam Updated Vital Signs BP (!) 159/56 (BP Location: Right Arm)   Pulse 65   Temp (!) 97.3 F (36.3 C) (Oral)   Resp 19   Ht 5' (1.524 m)   Wt 65.3 kg   SpO2 99%   BMI 28.12 kg/m   Physical Exam Vitals signs and nursing note reviewed.  Constitutional:      General: She is not in acute distress.    Appearance: She is well-developed. She is not diaphoretic.  HENT:     Head: Normocephalic and atraumatic.     Mouth/Throat:     Mouth: Mucous membranes are moist.  Eyes:     General: No scleral icterus.       Right eye: No discharge.        Left eye: No discharge.     Conjunctiva/sclera: Conjunctivae normal.   Neck:     Musculoskeletal: Normal range of motion and neck supple.  Cardiovascular:     Rate and Rhythm: Normal rate and regular rhythm.     Pulses: Normal pulses.  Heart sounds: Murmur present.  Pulmonary:     Effort: Pulmonary effort is normal. No respiratory distress.     Breath sounds: Normal breath sounds. No stridor. No wheezing.  Abdominal:     General: Abdomen is flat. There is no distension.     Tenderness: There is no abdominal tenderness.  Musculoskeletal:        General: No deformity.     Right lower leg: No edema.     Left lower leg: No edema.  Skin:    General: Skin is warm and dry.  Neurological:     General: No focal deficit present.     Mental Status: She is alert and oriented to person, place, and time.     Motor: No abnormal muscle tone.  Psychiatric:        Mood and Affect: Mood normal.        Behavior: Behavior normal.      ED Treatments / Results  Labs (all labs ordered are listed, but only abnormal results are displayed) Labs Reviewed  CBC WITH DIFFERENTIAL/PLATELET - Abnormal; Notable for the following components:      Result Value   WBC 14.8 (*)    Neutro Abs 13.0 (*)    Lymphs Abs 0.6 (*)    All other components within normal limits  CBG MONITORING, ED - Abnormal; Notable for the following components:   Glucose-Capillary 111 (*)    All other components within normal limits  COMPREHENSIVE METABOLIC PANEL  LIPASE, BLOOD  TROPONIN I (HIGH SENSITIVITY)    EKG EKG Interpretation  Date/Time:  Thursday September 04 2019 21:24:38 EST Ventricular Rate:  72 PR Interval:    QRS Duration: 81 QT Interval:  436 QTC Calculation: 478 R Axis:   46 Text Interpretation: Sinus rhythm Anteroseptal infarct, old No significant change since last tracing Confirmed by Blanchie Dessert [phone]) on 09/04/2019 9:49:14 PM   Radiology Dg Chest Port 1 View  Result Date: 09/04/2019 CLINICAL DATA:  Chest pain EXAM: PORTABLE CHEST 1 VIEW COMPARISON:  CTA  chest 08/05/2019 fall FINDINGS: There is some persistent ill-defined opacity in the right upper lung corresponding to a architectural distortion on comparison CT. A known 3 mm right upper lobe nodule is not well visualized on this exam. No consolidation, features of edema, pneumothorax, or effusion. Pulmonary vascularity is normally distributed. Coronary artery stent is noted. Atherosclerotic calcification of the aorta. Cardiomediastinal contours are otherwise unremarkable. No acute osseous or soft tissue abnormality. Prior bilateral mastectomy. Degenerative changes are present in the imaged spine and shoulders. IMPRESSION: No acute cardiopulmonary abnormality. Persistent right apical opacity corresponding to ground-glass on comparison CT. Likely treatment related effect. Nonvisualization of a 3 mm right upper lobe nodule, likely below the resolution of radiographic imaging. Aortic Atherosclerosis (ICD10-I70.0). Coronary stenting noted as well. Electronically Signed   By: Lovena Le M.D.   On: 09/04/2019 22:26    Procedures Procedures (including critical care time)  Medications Ordered in ED Medications - No data to display   Initial Impression / Assessment and Plan / ED Course  I have reviewed the triage vital signs and the nursing notes.  Pertinent labs & imaging results that were available during my care of the patient were reviewed by me and considered in my medical decision making (see chart for details).       Patient presents today for evaluation of a hypoglycemic episode.  She was found to have a blood sugar that simply read low and be altered by EMS.  She was treated with IV sugar after which her mental status improved.  She is unsure why she went hypoglycemic.  Given that she had chest pain with shortness of breath, nausea, and vomiting will obtain Troponin, EKG.  Ordered sandwich and repeat CBG checks.    At shift change care was transferred to Dr. Stark Jock who will follow pending  studies, re-evaulate and determine disposition.      Final Clinical Impressions(s) / ED Diagnoses   Final diagnoses:  Hypoglycemia  Chest pain, unspecified type    ED Discharge Orders    None       Ollen Gross 09/04/19 2333    Blanchie Dessert, MD 09/05/19 0001    Veryl Speak, MD 09/06/19 [phone]

## 2019-09-04 NOTE — ED Notes (Signed)
Pt given sandwich and water.

## 2019-09-04 NOTE — ED Notes (Signed)
Pt ambulated to bathroom by self without complaint

## 2019-09-04 NOTE — Discharge Instructions (Addendum)
Continue your medications as previously prescribed.  Keep a close eye on your blood sugars for the next several days.  Return to the emergency department if your symptoms significantly worsen or change.

## 2019-09-05 LAB — CBG MONITORING, ED: Glucose-Capillary: 180 mg/dL — ABNORMAL HIGH (ref 70–99)

## 2019-09-05 LAB — TROPONIN I (HIGH SENSITIVITY): Troponin I (High Sensitivity): 13 ng/L (ref <18)

## 2019-09-05 NOTE — ED Provider Notes (Signed)
Care assumed from Dr. Maryan Rued and Wyn Quaker at shift change.  Patient awaiting results of her laboratory studies.  Patient had a hypoglycemic episode at home and then experienced some tightness in her chest in the ambulance on the way here.  Her laboratory studies have returned with negative troponin x2.  Her EKG is unchanged.  Her blood sugars have remained stable and she is feeling much better.  At this point, I see no indication for admission and feel as though discharge is appropriate.  Patient is to return as needed if symptoms worsen or change.   Veryl Speak, MD 09/05/19 [phone]

## 2019-10-22 ENCOUNTER — Other Ambulatory Visit: Payer: Self-pay | Admitting: Physician Assistant

## 2019-10-22 DIAGNOSIS — M8589 Other specified disorders of bone density and structure, multiple sites: Secondary | ICD-10-CM

## 2019-11-05 DIAGNOSIS — E663 Overweight: Secondary | ICD-10-CM | POA: Diagnosis not present

## 2019-11-05 DIAGNOSIS — M329 Systemic lupus erythematosus, unspecified: Secondary | ICD-10-CM | POA: Diagnosis not present

## 2019-11-05 DIAGNOSIS — Z6828 Body mass index (BMI) 28.0-28.9, adult: Secondary | ICD-10-CM | POA: Diagnosis not present

## 2019-11-05 DIAGNOSIS — Z853 Personal history of malignant neoplasm of breast: Secondary | ICD-10-CM | POA: Diagnosis not present

## 2019-11-05 DIAGNOSIS — E10649 Type 1 diabetes mellitus with hypoglycemia without coma: Secondary | ICD-10-CM | POA: Diagnosis not present

## 2019-11-05 DIAGNOSIS — I2510 Atherosclerotic heart disease of native coronary artery without angina pectoris: Secondary | ICD-10-CM | POA: Diagnosis not present

## 2019-11-06 ENCOUNTER — Ambulatory Visit: Payer: Medicare HMO | Attending: Internal Medicine

## 2019-11-06 DIAGNOSIS — Z23 Encounter for immunization: Secondary | ICD-10-CM | POA: Insufficient documentation

## 2019-11-06 NOTE — Progress Notes (Signed)
   Covid-19 Vaccination Clinic  Name:  Molly Hill    MRN: [id number] DOB: 1951/02/16  11/06/2019  [name] was observed post Covid-19 immunization for 15 minutes without incidence. She was provided with Vaccine Information Sheet and instruction to access the V-Safe system.   [name] was instructed to call 911 with any severe reactions post vaccine: Marland Kitchen Difficulty breathing  . Swelling of your face and throat  . A fast heartbeat  . A bad Vana all over your body  . Dizziness and weakness    Immunizations Administered    Name Date Dose VIS Date Route   Pfizer COVID-19 Vaccine 11/06/2019  3:24 PM 0.3 mL 09/26/2019 Intramuscular   Manufacturer: [place]   Lot: [id number]   [place]: [id number]

## 2019-11-27 ENCOUNTER — Ambulatory Visit: Payer: Medicare HMO | Attending: Internal Medicine

## 2019-11-27 DIAGNOSIS — Z23 Encounter for immunization: Secondary | ICD-10-CM | POA: Insufficient documentation

## 2019-11-27 NOTE — Progress Notes (Signed)
   Covid-19 Vaccination Clinic  Name:  AUDRIELLE REGER    MRN: [id number] DOB: [birth year]  11/27/2019  Ms. Merfeld was observed post Covid-19 immunization for 15 minutes without incidence. She was provided with Vaccine Information Sheet and instruction to access the V-Safe system.   Ms. Bagshaw was instructed to call 911 with any severe reactions post vaccine: Marland Kitchen Difficulty breathing  . Swelling of your face and throat  . A fast heartbeat  . A bad Krinke all over your body  . Dizziness and weakness    Immunizations Administered    Name Date Dose VIS Date Route   Pfizer COVID-19 Vaccine 11/27/2019  2:18 PM 0.3 mL 09/26/2019 Intramuscular   Manufacturer: [place]   Lot: [id number]   [place]: [id number]

## 2019-12-23 ENCOUNTER — Encounter: Payer: Self-pay | Admitting: *Deleted

## 2019-12-24 ENCOUNTER — Inpatient Hospital Stay: Payer: Medicare HMO | Attending: Internal Medicine | Admitting: Internal Medicine

## 2019-12-24 ENCOUNTER — Other Ambulatory Visit: Payer: Self-pay

## 2019-12-24 ENCOUNTER — Inpatient Hospital Stay: Payer: Medicare HMO

## 2019-12-24 ENCOUNTER — Encounter: Payer: Self-pay | Admitting: Internal Medicine

## 2019-12-24 VITALS — BP 157/76 | HR 66 | Temp 97.2°F | Wt 141.0 lb

## 2019-12-24 DIAGNOSIS — Z800 Family history of malignant neoplasm of digestive organs: Secondary | ICD-10-CM | POA: Diagnosis not present

## 2019-12-24 DIAGNOSIS — Z171 Estrogen receptor negative status [ER-]: Secondary | ICD-10-CM

## 2019-12-24 DIAGNOSIS — E119 Type 2 diabetes mellitus without complications: Secondary | ICD-10-CM | POA: Diagnosis not present

## 2019-12-24 DIAGNOSIS — D649 Anemia, unspecified: Secondary | ICD-10-CM | POA: Diagnosis not present

## 2019-12-24 DIAGNOSIS — Z853 Personal history of malignant neoplasm of breast: Secondary | ICD-10-CM | POA: Diagnosis not present

## 2019-12-24 DIAGNOSIS — I10 Essential (primary) hypertension: Secondary | ICD-10-CM | POA: Diagnosis not present

## 2019-12-24 DIAGNOSIS — Z803 Family history of malignant neoplasm of breast: Secondary | ICD-10-CM | POA: Insufficient documentation

## 2019-12-24 DIAGNOSIS — Z9011 Acquired absence of right breast and nipple: Secondary | ICD-10-CM | POA: Diagnosis not present

## 2019-12-24 DIAGNOSIS — C50811 Malignant neoplasm of overlapping sites of right female breast: Secondary | ICD-10-CM | POA: Diagnosis not present

## 2019-12-24 DIAGNOSIS — Z794 Long term (current) use of insulin: Secondary | ICD-10-CM | POA: Insufficient documentation

## 2019-12-24 DIAGNOSIS — I2510 Atherosclerotic heart disease of native coronary artery without angina pectoris: Secondary | ICD-10-CM | POA: Diagnosis not present

## 2019-12-24 DIAGNOSIS — Z87891 Personal history of nicotine dependence: Secondary | ICD-10-CM | POA: Insufficient documentation

## 2019-12-24 DIAGNOSIS — Z79899 Other long term (current) drug therapy: Secondary | ICD-10-CM | POA: Insufficient documentation

## 2019-12-24 DIAGNOSIS — R5383 Other fatigue: Secondary | ICD-10-CM

## 2019-12-24 DIAGNOSIS — R5382 Chronic fatigue, unspecified: Secondary | ICD-10-CM | POA: Insufficient documentation

## 2019-12-24 DIAGNOSIS — E860 Dehydration: Secondary | ICD-10-CM | POA: Diagnosis not present

## 2019-12-24 DIAGNOSIS — L659 Nonscarring hair loss, unspecified: Secondary | ICD-10-CM | POA: Insufficient documentation

## 2019-12-24 DIAGNOSIS — E785 Hyperlipidemia, unspecified: Secondary | ICD-10-CM | POA: Insufficient documentation

## 2019-12-24 DIAGNOSIS — R011 Cardiac murmur, unspecified: Secondary | ICD-10-CM | POA: Insufficient documentation

## 2019-12-24 DIAGNOSIS — Z7982 Long term (current) use of aspirin: Secondary | ICD-10-CM | POA: Insufficient documentation

## 2019-12-24 LAB — COMPREHENSIVE METABOLIC PANEL
ALT: 14 U/L (ref 0–44)
AST: 22 U/L (ref 15–41)
Albumin: 4.2 g/dL (ref 3.5–5.0)
Alkaline Phosphatase: 82 U/L (ref 38–126)
Anion gap: 11 (ref 5–15)
BUN: 25 mg/dL — ABNORMAL HIGH (ref 8–23)
CO2: 26 mmol/L (ref 22–32)
Calcium: 9.3 mg/dL (ref 8.9–10.3)
Chloride: 99 mmol/L (ref 98–111)
Creatinine, Ser: 0.72 mg/dL (ref 0.44–1.00)
GFR calc Af Amer: >60 mL/min (ref >60)
GFR calc non Af Amer: >60 mL/min (ref >60)
Glucose, Bld: 160 mg/dL — ABNORMAL HIGH (ref 70–99)
Potassium: 4.3 mmol/L (ref 3.5–5.1)
Sodium: 136 mmol/L (ref 135–145)
Total Bilirubin: 0.9 mg/dL (ref 0.3–1.2)
Total Protein: 7.2 g/dL (ref 6.5–8.1)

## 2019-12-24 LAB — CBC WITH DIFFERENTIAL/PLATELET
Abs Immature Granulocytes: 0.01 10*3/uL (ref 0.00–0.07)
Basophils Absolute: 0 10*3/uL (ref 0.0–0.1)
Basophils Relative: 1 %
Eosinophils Absolute: 0.2 10*3/uL (ref 0.0–0.5)
Eosinophils Relative: 4 %
HCT: 36.7 % (ref 36.0–46.0)
Hemoglobin: 11.7 g/dL — ABNORMAL LOW (ref 12.0–15.0)
Immature Granulocytes: 0 %
Lymphocytes Relative: 29 %
Lymphs Abs: 1.7 10*3/uL (ref 0.7–4.0)
MCH: 30.3 pg (ref 26.0–34.0)
MCHC: 31.9 g/dL (ref 30.0–36.0)
MCV: 95.1 fL (ref 80.0–100.0)
Monocytes Absolute: 0.4 10*3/uL (ref 0.1–1.0)
Monocytes Relative: 7 %
Neutro Abs: 3.4 10*3/uL (ref 1.7–7.7)
Neutrophils Relative %: 59 %
Platelets: 196 10*3/uL (ref 150–400)
RBC: 3.86 MIL/uL — ABNORMAL LOW (ref 3.87–5.11)
RDW: 12.1 % (ref 11.5–15.5)
WBC: 5.7 10*3/uL (ref 4.0–10.5)
nRBC: 0 % (ref 0.0–0.2)

## 2019-12-24 LAB — TSH: TSH: 1.808 u[IU]/mL (ref 0.350–4.500)

## 2019-12-24 NOTE — Assessment & Plan Note (Addendum)
Bilateral breast cancer triple negative [September 2016]-status post neoadjuvant chemotherapy followed by surgery.  With complete response noted.  #Clinically no evidence of recurrence.  Patient most likely cured of her malignancy.  Discussed overall good prognosis.  Would not recommend any imaging continue surveillance on a clinical basis.  # Genetics- as per pt-NEG.   #Chronic fatigue-unclear etiology; check labs including TSH  #Surveillance for osteoporosis-awaiting bone density in 2 weeks with PCP.  # DISPOSITION: # labs today [ordered] # Follow up in 12 months- MD; labs- cbc/cmp-Dr.B  # Thank you,  for allowing me to participate in the care of your pleasant patient. Please do not hesitate to contact me with questions or concerns in the interim.  # 45 minutes face-to-face with the patient discussing the above plan of care; more than 50% of time spent on prognosis/ natural history; counseling and coordination.   Addendum: Labs showed TSH normal; rest of the labs are normal except for mild anemia hemoglobin 11.7-no signs of bleeding..  Patient had prior colonoscopy in 2016.  Discussed with patient that I would recommend follow-up labs in 1 month.

## 2019-12-25 ENCOUNTER — Encounter: Payer: Self-pay | Admitting: Internal Medicine

## 2019-12-25 ENCOUNTER — Telehealth: Payer: Self-pay | Admitting: Internal Medicine

## 2019-12-25 NOTE — Telephone Encounter (Signed)
Left a message for the patient to call us back to discuss the results of her blood work.

## 2019-12-28 ENCOUNTER — Telehealth: Payer: Self-pay | Admitting: Internal Medicine

## 2019-12-28 DIAGNOSIS — C50811 Malignant neoplasm of overlapping sites of right female breast: Secondary | ICD-10-CM

## 2019-12-28 DIAGNOSIS — Z171 Estrogen receptor negative status [ER-]: Secondary | ICD-10-CM

## 2019-12-28 NOTE — Telephone Encounter (Signed)
On 3/11 spoke to pt re: spoke to patient regarding goals of mildly low hemoglobin 11.7.  This should not cause her significant fatigue.  Colonoscopy 5- 6 years ago polyps noted as per patient.  Recommend follow-up labs-CBC BMP; [id number] folic acid iron studies ferritin in 1 month; will call with results. Please schedule.

## 2019-12-29 NOTE — Addendum Note (Signed)
Addended by: Gloris Ham on: 12/29/2019 11:17 AM   Modules accepted: Orders

## 2020-01-01 NOTE — Progress Notes (Signed)
[name]  Patient Care Team: Ernestene Kiel, MD as PCP - General (General Practice)  CHIEF COMPLAINTS/PURPOSE OF CONSULTATION: breast cancer  #  Oncology History Overview Note  #September 2016-multifocal right breast cancer triple negative [ER/PR HER-2 negative; [facility]]-PET-no metastatic disease; dose dense Adriamycin followed by weekly Taxol.  November 05, 2013-right mastectomy- [id number]-complete pathologic response.  status post adjuvant right ax.radiation Left breast cancer- as pt/reocds s/p mastectomy-no malignancy noted.    # DM- on insulin [since 4132]  # SURVIVORSHIP:   # GENETICS: NEG as per pt.   DIAGNOSIS: Right breast cancer  STAGE: early stage        ;  GOALS: cure  CURRENT/MOST RECENT THERAPY : Surveillance      Carcinoma of overlapping sites of right breast in female, estrogen receptor negative ([place])  12/24/2019 Initial Diagnosis   Carcinoma of overlapping sites of right breast in female, estrogen receptor negative ([place])      HISTORY OF PRESENTING ILLNESS:  Molly Hill 69 y.o.  female above history of breast cancer is here to establish care since she moved from [place] area.  Reviewed the records at length-office note/labs from referral providers office-summarized/as above.  Patient denies any new lumps or bumps.  Denies any nausea vomiting headaches.  Patient complains of ongoing fatigue.  Patient is taking care of her husband with Parkinson's.   Review of Systems  Constitutional: Positive for malaise/fatigue. Negative for chills, diaphoresis, fever and weight loss.  HENT: Negative for nosebleeds and sore throat.   Eyes: Negative for double vision.  Respiratory: Negative for cough, hemoptysis, sputum production, shortness of breath and wheezing.   Cardiovascular: Negative for chest pain, palpitations, orthopnea and leg swelling.  Gastrointestinal: Negative for abdominal pain, blood in  stool, constipation, diarrhea, heartburn, melena, nausea and vomiting.  Genitourinary: Negative for dysuria, frequency and urgency.  Musculoskeletal: Positive for joint pain. Negative for back pain.  Skin: Negative.  Negative for itching and [place].  Neurological: Negative for dizziness, tingling, focal weakness, weakness and headaches.  Endo/Heme/Allergies: Does not bruise/bleed easily.  Psychiatric/Behavioral: Negative for depression. The patient is not nervous/anxious and does not have insomnia.      MEDICAL HISTORY:  Past Medical History:  Diagnosis Date  . Alopecia   . Anemia   . Bicuspid aortic valve    mild to moderate AR and mild AS by echo 2020  . Carotid stenosis    1-39% stenosis bilaterally by dopplers 07/2019  . Convalescence after chemotherapy   . Coronary arteriosclerosis in native artery 2013   s/p PCI of LAD  . DM (diabetes mellitus), type 1 ([place])   . Estrogen receptor negative   . History of breast cancer   . Hyperglycemia   . Hyperlipidemia   . Hypertension   . Mild dehydration   . Mitral regurgitation    mild to moderate by echo 2020  . Murmur, heart   . Swelling of joint of both wrists   . Tendinitis of left wrist     SURGICAL HISTORY: Past Surgical History:  Procedure Laterality Date  . ANKLE SURGERY Bilateral   . CATARACT EXTRACTION, BILATERAL    . CHOLECYSTECTOMY    . CORONARY STENT INTERVENTION    . CTR    . ELBOW SURGERY    . HAND SURGERY     BOTH THUMBS AND MIDDLE FINGERS  . LEFT BREAST MASTECTOMY    . MASCULAR  REPAIR    . PORT-A-CATH REMOVAL    .  PORTACATH PLACEMENT    . RIGHT MODIFIED MASTECTOMY Right   . UPPER LIP SURGERY      SOCIAL HISTORY: Social History   Socioeconomic History  . Marital status: Married    Spouse name: Not on file  . Number of children: Not on file  . Years of education: Not on file  . Highest education level: Not on file  Occupational History  . Not on file  Tobacco Use  . Smoking status: Former  Research scientist (life sciences)  . Smokeless tobacco: Never Used  Substance and Sexual Activity  . Alcohol use: Never  . Drug use: Never  . Sexual activity: Never  Other Topics Concern  . Not on file  Social History Narrative  . Not on file   Social Determinants of Health   Financial Resource Strain:   . Difficulty of Paying Living Expenses:   Food Insecurity:   . Worried About Charity fundraiser in the Last Year:   . Arboriculturist in the Last Year:   Transportation Needs:   . Film/video editor (Medical):   Marland Kitchen Lack of Transportation (Non-Medical):   Physical Activity:   . Days of Exercise per Week:   . Minutes of Exercise per Session:   Stress:   . Feeling of Stress :   Social Connections:   . Frequency of Communication with Friends and Family:   . Frequency of Social Gatherings with Friends and Family:   . Attends Religious Services:   . Active Member of Clubs or Organizations:   . Attends Archivist Meetings:   Marland Kitchen Marital Status:   Intimate Partner Violence:   . Fear of Current or Ex-Partner:   . Emotionally Abused:   Marland Kitchen Physically Abused:   . Sexually Abused:     FAMILY HISTORY: Family History  Problem Relation Age of Onset  . Heart attack Mother   . CAD Father        CABG  . Diabetes Father   . Stroke Father   . Colon cancer Father        77s  . Diabetes Brother   . Diabetes Paternal Grandfather   . Heart attack Paternal Grandfather   . Breast cancer Maternal Aunt        58s    ALLERGIES:  is allergic to codeine and reglan [metoclopramide].  MEDICATIONS:  Current Outpatient Medications  Medication Sig Dispense Refill  . aspirin EC 81 MG tablet Take 81 mg by mouth daily.    Marland Kitchen atorvastatin (LIPITOR) 40 MG tablet Take 40 mg by mouth daily.    . Biotin w/ Vitamins C & E (HAIR/SKIN/NAILS PO) Take by mouth.    . Calcium Carb-Cholecalciferol (CALCIUM 600 + D PO) Take by mouth.    . docusate sodium (COLACE) 100 MG capsule Take 100 mg by mouth daily as needed for  mild constipation.    . fexofenadine (ALLEGRA) 180 MG tablet Take 180 mg by mouth daily.    . fluticasone (FLONASE) 50 MCG/ACT nasal spray Place into both nostrils daily.    Marland Kitchen glucose blood test strip 1 each by Other route as needed for other. Use as instructed    . insulin NPH Human (NOVOLIN N) 100 UNIT/ML injection Inject into the skin.    Marland Kitchen insulin regular (NOVOLIN R) 100 units/mL injection Inject 100 Units into the skin 3 (three) times daily before meals.    Marland Kitchen loperamide (IMODIUM) 1 MG/5ML solution Take by mouth as needed for diarrhea or loose  stools.    . Metoprolol Succinate 25 MG CS24 Take 25 mg by mouth.    . nitroGLYCERIN (NITROSTAT) 0.4 MG SL tablet Place 0.4 mg under the tongue every 5 (five) minutes as needed for chest pain.    . ramipril (ALTACE) 10 MG capsule Take 10 mg by mouth daily.    . traZODone (DESYREL) 50 MG tablet Take 50 mg by mouth 3 (three) times daily.     No current facility-administered medications for this visit.      Marland Kitchen  PHYSICAL EXAMINATION: ECOG PERFORMANCE STATUS: 0 - Asymptomatic  Vitals:   12/24/19 1123  BP: (!) 157/76  Pulse: 66  Temp: (!) 97.2 F (36.2 C)   Filed Weights   12/24/19 1123  Weight: 141 lb (64 kg)    Physical Exam  Constitutional: She is oriented to person, place, and time and well-developed, well-nourished, and in no distress.  HENT:  Head: Normocephalic and atraumatic.  Mouth/Throat: Oropharynx is clear and moist. No oropharyngeal exudate.  Eyes: Pupils are equal, round, and reactive to light.  Cardiovascular: Normal rate and regular rhythm.  Pulmonary/Chest: No respiratory distress. She has no wheezes.  Abdominal: Soft. Bowel sounds are normal. She exhibits no distension and no mass. There is no abdominal tenderness. There is no rebound and no guarding.  Musculoskeletal:        General: No tenderness or edema. Normal range of motion.     Cervical back: Normal range of motion and neck supple.  Neurological: She is alert  and oriented to person, place, and time.  Skin: Skin is warm.  Bilateral mastectomies noted.  No recurrence.  Psychiatric: Affect normal.     LABORATORY DATA:  I have reviewed the data as listed Lab Results  Component Value Date   WBC 5.7 12/24/2019   HGB 11.7 (L) 12/24/2019   HCT 36.7 12/24/2019   MCV 95.1 12/24/2019   PLT 196 12/24/2019   Recent Labs    07/29/19 1110 09/04/19 2257 12/24/19 1211  NA 136 137 136  K 4.9 4.0 4.3  CL 97 100 99  CO2 23 29 26   GLUCOSE 285* 80 160*  BUN 21 27* 25*  CREATININE 1.07* 0.99 0.72  CALCIUM 9.2 9.1 9.3  GFRNONAA 53* 58* >60  GFRAA 62 >60 >60  PROT  --  7.3 7.2  ALBUMIN  --  4.2 4.2  AST  --  152* 22  ALT  --  57* 14  ALKPHOS  --  90 82  BILITOT  --  0.9 0.9    RADIOGRAPHIC STUDIES: I have personally reviewed the radiological images as listed and agreed with the findings in the report. No results found.  ASSESSMENT & PLAN:   Carcinoma of overlapping sites of right breast in female, estrogen receptor negative ([place]) Bilateral breast cancer triple negative [September 2016]-status post neoadjuvant chemotherapy followed by surgery.  With complete response noted.  #Clinically no evidence of recurrence.  Patient most likely cured of her malignancy.  Discussed overall good prognosis.  Would not recommend any imaging continue surveillance on a clinical basis.  # Genetics- as per pt-NEG.   #Chronic fatigue-unclear etiology; check labs including TSH  #Surveillance for osteoporosis-awaiting bone density in 2 weeks with PCP.  # DISPOSITION: # labs today [ordered] # Follow up in 12 months- MD; labs- cbc/cmp-Dr.B  # Thank you,  for allowing me to participate in the care of your pleasant patient. Please do not hesitate to contact me with questions or concerns in the  interim.  # 45 minutes face-to-face with the patient discussing the above plan of care; more than 50% of time spent on prognosis/ natural history; counseling and  coordination.   Addendum: Labs showed TSH normal; rest of the labs are normal except for mild anemia hemoglobin 11.7-no signs of bleeding..  Patient had prior colonoscopy in 2016.  Discussed with patient that I would recommend follow-up labs in 1 month.     All questions were answered. The patient knows to call the clinic with any problems, questions or concerns.    Cammie Sickle, MD 01/01/2020 6:12 AM

## 2020-01-08 ENCOUNTER — Other Ambulatory Visit: Payer: Medicare PPO

## 2020-01-21 ENCOUNTER — Inpatient Hospital Stay: Payer: Medicare HMO | Attending: Internal Medicine

## 2020-01-21 ENCOUNTER — Other Ambulatory Visit: Payer: Self-pay

## 2020-01-21 DIAGNOSIS — E538 Deficiency of other specified B group vitamins: Secondary | ICD-10-CM | POA: Diagnosis not present

## 2020-01-21 DIAGNOSIS — D509 Iron deficiency anemia, unspecified: Secondary | ICD-10-CM | POA: Insufficient documentation

## 2020-01-21 DIAGNOSIS — Z170 Estrogen receptor positive status [ER+]: Secondary | ICD-10-CM | POA: Diagnosis not present

## 2020-01-21 DIAGNOSIS — C50811 Malignant neoplasm of overlapping sites of right female breast: Secondary | ICD-10-CM | POA: Diagnosis not present

## 2020-01-21 DIAGNOSIS — Z171 Estrogen receptor negative status [ER-]: Secondary | ICD-10-CM

## 2020-01-21 LAB — BASIC METABOLIC PANEL
Anion gap: 9 (ref 5–15)
BUN: 27 mg/dL — ABNORMAL HIGH (ref 8–23)
CO2: 29 mmol/L (ref 22–32)
Calcium: 9.5 mg/dL (ref 8.9–10.3)
Chloride: 96 mmol/L — ABNORMAL LOW (ref 98–111)
Creatinine, Ser: 0.93 mg/dL (ref 0.44–1.00)
GFR calc Af Amer: >60 mL/min (ref >60)
GFR calc non Af Amer: >60 mL/min (ref >60)
Glucose, Bld: 364 mg/dL — ABNORMAL HIGH (ref 70–99)
Potassium: 4.7 mmol/L (ref 3.5–5.1)
Sodium: 134 mmol/L — ABNORMAL LOW (ref 135–145)

## 2020-01-21 LAB — CBC WITH DIFFERENTIAL/PLATELET
Abs Immature Granulocytes: 0.02 10*3/uL (ref 0.00–0.07)
Basophils Absolute: 0 10*3/uL (ref 0.0–0.1)
Basophils Relative: 0 %
Eosinophils Absolute: 0.3 10*3/uL (ref 0.0–0.5)
Eosinophils Relative: 4 %
HCT: 35.1 % — ABNORMAL LOW (ref 36.0–46.0)
Hemoglobin: 11.7 g/dL — ABNORMAL LOW (ref 12.0–15.0)
Immature Granulocytes: 0 %
Lymphocytes Relative: 25 %
Lymphs Abs: 1.6 10*3/uL (ref 0.7–4.0)
MCH: 31.3 pg (ref 26.0–34.0)
MCHC: 33.3 g/dL (ref 30.0–36.0)
MCV: 93.9 fL (ref 80.0–100.0)
Monocytes Absolute: 0.5 10*3/uL (ref 0.1–1.0)
Monocytes Relative: 8 %
Neutro Abs: 3.9 10*3/uL (ref 1.7–7.7)
Neutrophils Relative %: 63 %
Platelets: 189 10*3/uL (ref 150–400)
RBC: 3.74 MIL/uL — ABNORMAL LOW (ref 3.87–5.11)
RDW: 12.2 % (ref 11.5–15.5)
WBC: 6.3 10*3/uL (ref 4.0–10.5)
nRBC: 0 % (ref 0.0–0.2)

## 2020-01-21 LAB — IRON AND TIBC
Iron: 64 ug/dL (ref 28–170)
Saturation Ratios: 22 % (ref 10.4–31.8)
TIBC: 290 ug/dL (ref 250–450)
UIBC: 226 ug/dL

## 2020-01-21 LAB — VITAMIN B12: Vitamin B-12: 295 pg/mL (ref 180–914)

## 2020-01-21 LAB — FOLATE: Folate: 14.1 ng/mL (ref >5.9)

## 2020-01-21 LAB — FERRITIN: Ferritin: 77 ng/mL (ref 11–307)

## 2020-01-23 ENCOUNTER — Telehealth: Payer: Self-pay | Admitting: Internal Medicine

## 2020-01-23 DIAGNOSIS — C50811 Malignant neoplasm of overlapping sites of right female breast: Secondary | ICD-10-CM

## 2020-01-23 DIAGNOSIS — Z171 Estrogen receptor negative status [ER-]: Secondary | ICD-10-CM

## 2020-01-23 NOTE — Telephone Encounter (Signed)
On 4/08-spoke to patient regarding results of the blood work hemoglobin stable; no obvious evidence of iron deficiency.  Recommend holding off any further work-up including bone marrow biopsy at this time.  Recommend follow-up in 4 months-MD; labs CBC; CMP LDH.  Please schedule.  Thx GB

## 2020-01-23 NOTE — Addendum Note (Signed)
Addended by: Gloris Ham on: 01/23/2020 09:22 AM   Modules accepted: Orders

## 2020-02-29 ENCOUNTER — Emergency Department (HOSPITAL_COMMUNITY)
Admission: EM | Admit: 2020-02-29 | Discharge: 2020-02-29 | Disposition: A | Discharge status: Home / Self Care | Payer: Medicare HMO | Attending: Emergency Medicine | Admitting: Emergency Medicine

## 2020-02-29 ENCOUNTER — Other Ambulatory Visit: Payer: Self-pay

## 2020-02-29 ENCOUNTER — Encounter (HOSPITAL_COMMUNITY): Payer: Self-pay | Admitting: Emergency Medicine

## 2020-02-29 DIAGNOSIS — E109 Type 1 diabetes mellitus without complications: Secondary | ICD-10-CM | POA: Insufficient documentation

## 2020-02-29 DIAGNOSIS — I10 Essential (primary) hypertension: Secondary | ICD-10-CM | POA: Insufficient documentation

## 2020-02-29 DIAGNOSIS — Z87891 Personal history of nicotine dependence: Secondary | ICD-10-CM | POA: Insufficient documentation

## 2020-02-29 DIAGNOSIS — R110 Nausea: Secondary | ICD-10-CM | POA: Diagnosis not present

## 2020-02-29 DIAGNOSIS — R55 Syncope and collapse: Secondary | ICD-10-CM | POA: Insufficient documentation

## 2020-02-29 DIAGNOSIS — R1110 Vomiting, unspecified: Secondary | ICD-10-CM | POA: Diagnosis present

## 2020-02-29 DIAGNOSIS — Z79899 Other long term (current) drug therapy: Secondary | ICD-10-CM | POA: Diagnosis not present

## 2020-02-29 DIAGNOSIS — Z7982 Long term (current) use of aspirin: Secondary | ICD-10-CM | POA: Diagnosis not present

## 2020-02-29 DIAGNOSIS — Z743 Need for continuous supervision: Secondary | ICD-10-CM | POA: Diagnosis not present

## 2020-02-29 DIAGNOSIS — R0902 Hypoxemia: Secondary | ICD-10-CM | POA: Diagnosis not present

## 2020-02-29 LAB — COMPREHENSIVE METABOLIC PANEL
ALT: 14 U/L (ref 0–44)
AST: 24 U/L (ref 15–41)
Albumin: 3.7 g/dL (ref 3.5–5.0)
Alkaline Phosphatase: 91 U/L (ref 38–126)
Anion gap: 15 (ref 5–15)
BUN: 30 mg/dL — ABNORMAL HIGH (ref 8–23)
CO2: 25 mmol/L (ref 22–32)
Calcium: 9.4 mg/dL (ref 8.9–10.3)
Chloride: 99 mmol/L (ref 98–111)
Creatinine, Ser: 0.98 mg/dL (ref 0.44–1.00)
GFR calc Af Amer: >60 mL/min (ref >60)
GFR calc non Af Amer: 59 mL/min — ABNORMAL LOW (ref >60)
Glucose, Bld: 114 mg/dL — ABNORMAL HIGH (ref 70–99)
Potassium: 3.9 mmol/L (ref 3.5–5.1)
Sodium: 139 mmol/L (ref 135–145)
Total Bilirubin: 0.5 mg/dL (ref 0.3–1.2)
Total Protein: 6.7 g/dL (ref 6.5–8.1)

## 2020-02-29 LAB — LIPASE, BLOOD: Lipase: 21 U/L (ref 11–51)

## 2020-02-29 LAB — CBC
HCT: 37.5 % (ref 36.0–46.0)
Hemoglobin: 12.2 g/dL (ref 12.0–15.0)
MCH: 31.2 pg (ref 26.0–34.0)
MCHC: 32.5 g/dL (ref 30.0–36.0)
MCV: 95.9 fL (ref 80.0–100.0)
Platelets: 184 10*3/uL (ref 150–400)
RBC: 3.91 MIL/uL (ref 3.87–5.11)
RDW: 12.6 % (ref 11.5–15.5)
WBC: 5.3 10*3/uL (ref 4.0–10.5)
nRBC: 0 % (ref 0.0–0.2)

## 2020-02-29 MED ORDER — SODIUM CHLORIDE 0.9% FLUSH: 3.0000 mL | Freq: Once | INTRAVENOUS | Status: DC

## 2020-02-29 NOTE — ED Triage Notes (Signed)
Pt reports nausea and vomiting after taking 1/2 tablet of her trazadone this am. States husband is in end of life and she was feeling very stressed and wanted to try and get some sleep today. EMS VSS. CBG 119. Pt a/ox4, resp e/u, nad.

## 2020-02-29 NOTE — ED Provider Notes (Signed)
[facility] EMERGENCY DEPARTMENT Provider Note   CSN: [id number] Arrival date & time: 02/29/20  0753     History Chief Complaint  Patient presents with  . Emesis    Molly Hill is a 69 y.o. female.  HPI     Hx of neurocardiogenic syncope, passes out from stress Significant amount of stress recently, husbnad on hospice Took half of trazodone last night to help with stress, then another half this morning with other morning meds and ended up having syncopal episode, then waking up and throwing up once at home and once in ambulance. Syncopal episode lasted less than 2 minutes. Passed out in the chair then slowly came to. No seizure like activity.  Ate donut and said felt "different" prior to event.  Felt similar to prior episodes, has had cardiology work up in the past.  Sees Dr. Radford Pax  Came to and was explaining how to check blood sugar to daughter 21, then was 115 glucose Didn't take any insulin this AM Feels ok now  No fevers, cough, urinary symptoms, no black or bloody stools recently, eating and drinking ok No chest pain or dyspnea, no palpitations  No diarrhea prior to this but feels like could go now, typically will have in association with these episodes Not undergoing breast cancer treatment now   Past Medical History:  Diagnosis Date  . Alopecia   . Anemia   . Bicuspid aortic valve    mild to moderate AR and mild AS by echo 2020  . Carotid stenosis    1-39% stenosis bilaterally by dopplers 07/2019  . Convalescence after chemotherapy   . Coronary arteriosclerosis in native artery 2013   s/p PCI of LAD  . DM (diabetes mellitus), type 1 ([place])   . Estrogen receptor negative   . History of breast cancer   . Hyperglycemia   . Hyperlipidemia   . Hypertension   . Mild dehydration   . Mitral regurgitation    mild to moderate by echo 2020  . Murmur, heart   . Swelling of joint of both wrists   . Tendinitis of left wrist     Patient  Active Problem List   Diagnosis Date Noted  . Carcinoma of overlapping sites of right breast in female, estrogen receptor negative ([place]) 12/24/2019  . Carotid stenosis   . Mitral regurgitation   . Bicuspid aortic valve   . Carotid bruit   . Coronary arteriosclerosis in native artery 2013    Past Surgical History:  Procedure Laterality Date  . ANKLE SURGERY Bilateral   . CATARACT EXTRACTION, BILATERAL    . CHOLECYSTECTOMY    . CORONARY STENT INTERVENTION    . CTR    . ELBOW SURGERY    . HAND SURGERY     BOTH THUMBS AND MIDDLE FINGERS  . LEFT BREAST MASTECTOMY    . MASCULAR  REPAIR    . PORT-A-CATH REMOVAL    . PORTACATH PLACEMENT    . RIGHT MODIFIED MASTECTOMY Right   . UPPER LIP SURGERY       OB History   No obstetric history on file.     Family History  Problem Relation Age of Onset  . Heart attack Mother   . CAD Father        CABG  . Diabetes Father   . Stroke Father   . Colon cancer Father        22s  . Diabetes Brother   . Diabetes Paternal  Grandfather   . Heart attack Paternal Grandfather   . Breast cancer Maternal Aunt        60s    Social History   Tobacco Use  . Smoking status: Former Research scientist (life sciences)  . Smokeless tobacco: Never Used  Substance Use Topics  . Alcohol use: Never  . Drug use: Never    Home Medications Prior to Admission medications   Medication Sig Start Date End Date Taking? Authorizing Provider  aspirin EC 81 MG tablet Take 81 mg by mouth daily.    [provider]  atorvastatin (LIPITOR) 40 MG tablet Take 40 mg by mouth daily.    [provider]  Biotin w/ Vitamins C & E (HAIR/SKIN/NAILS PO) Take by mouth.    [provider]  Calcium Carb-Cholecalciferol (CALCIUM 600 + D PO) Take by mouth.    [provider]  docusate sodium (COLACE) 100 MG capsule Take 100 mg by mouth daily as needed for mild constipation.    [provider]  fexofenadine (ALLEGRA) 180 MG tablet Take 180 mg by mouth daily.     [provider]  fluticasone (FLONASE) 50 MCG/ACT nasal spray Place into both nostrils daily.    [provider]  glucose blood test strip 1 each by Other route as needed for other. Use as instructed    [provider]  insulin NPH Human (NOVOLIN N) 100 UNIT/ML injection Inject into the skin.    [provider]  insulin regular (NOVOLIN R) 100 units/mL injection Inject 100 Units into the skin 3 (three) times daily before meals.    [provider]  loperamide (IMODIUM) 1 MG/5ML solution Take by mouth as needed for diarrhea or loose stools.    [provider]  Metoprolol Succinate 25 MG CS24 Take 25 mg by mouth.    [provider]  nitroGLYCERIN (NITROSTAT) 0.4 MG SL tablet Place 0.4 mg under the tongue every 5 (five) minutes as needed for chest pain.    [provider]  ramipril (ALTACE) 10 MG capsule Take 10 mg by mouth daily.    [provider]  traZODone (DESYREL) 50 MG tablet Take 50 mg by mouth 3 (three) times daily.    [provider]    Allergies    Codeine and Reglan [metoclopramide]  Review of Systems   Review of Systems  Constitutional: Negative for fever.  HENT: Negative for congestion.   Eyes: Negative for visual disturbance.  Respiratory: Negative for cough and shortness of breath.   Cardiovascular: Negative for chest pain and leg swelling.  Gastrointestinal: Positive for nausea and vomiting. Negative for abdominal pain and diarrhea.  Genitourinary: Negative for difficulty urinating and dysuria.  Neurological: Positive for syncope and light-headedness. Negative for facial asymmetry, speech difficulty, weakness, numbness and headaches.    Physical Exam Updated Vital Signs BP (!) 156/53 (BP Location: Right Arm)   Pulse 67   Temp 98.6 F (37 C) (Oral)   Resp 16   SpO2 97%   Physical Exam Vitals and nursing note reviewed.  Constitutional:      General: She is not in acute  distress.    Appearance: She is well-developed. She is not diaphoretic.  HENT:     Head: Normocephalic and atraumatic.  Eyes:     Conjunctiva/sclera: Conjunctivae normal.  Cardiovascular:     Rate and Rhythm: Normal rate and regular rhythm.     Heart sounds: Normal heart sounds. No murmur. No friction rub. No gallop.   Pulmonary:  Effort: Pulmonary effort is normal. No respiratory distress.     Breath sounds: Normal breath sounds. No wheezing or rales.  Abdominal:     General: There is no distension.     Palpations: Abdomen is soft.     Tenderness: There is no abdominal tenderness. There is no guarding.  Musculoskeletal:        General: No tenderness.     Cervical back: Normal range of motion.  Skin:    General: Skin is warm and dry.     Findings: No erythema or [place].  Neurological:     Mental Status: She is alert and oriented to person, place, and time.     ED Results / Procedures / Treatments   Labs (all labs ordered are listed, but only abnormal results are displayed) Labs Reviewed  COMPREHENSIVE METABOLIC PANEL - Abnormal; Notable for the following components:      Result Value   Glucose, Bld 114 (*)    BUN 30 (*)    GFR calc non Af Amer 59 (*)    All other components within normal limits  LIPASE, BLOOD  CBC  URINALYSIS, ROUTINE W REFLEX MICROSCOPIC    EKG EKG Interpretation  Date/Time:  Sunday Feb 29 2020 09:48:11 EDT Ventricular Rate:  66 PR Interval:    QRS Duration: 72 QT Interval:  437 QTC Calculation: 458 R Axis:   81 Text Interpretation: Sinus rhythm Anterior infarct, old No significant change since last tracing Confirmed by Ladrea Holladay ([id number]) on 02/29/2020 10:05:39 AM   Radiology No results found.  Procedures Procedures (including critical care time)  Medications Ordered in ED Medications  sodium chloride flush (NS) 0.9 % injection 3 mL (3 mLs Intravenous Not Given 02/29/20 1002)    ED Course  I have reviewed the triage vital signs  and the nursing notes.  Pertinent labs & imaging results that were available during my care of the patient were reviewed by me and considered in my medical decision making (see chart for details).    MDM Rules/Calculators/A&P                      68  year old female with a history of coronary artery disease, diabetes, hypertension, hyperlipidemia, bicuspid aortic valve, history of neurocardiogenic syncope for which she has had work up with Cardiology who presents with concern for syncopal episode. DDx for syncope includes cardiac arrhythmia, MI, PE, electrolyte abnormality, hypovolemia including dehydration and anemia/GI bleed, infection, neurocardiogenic syncope.  EKG unchanged from previous. No chest pain, dyspnea, nor palpitations and doubt ACS/PE. No significant electrolyte abnormalities. No anemia.  No focal abnormalities on history or exam to suggest CVA.  Suspect syncopal episode in setting of stress, trazodone use, likely recurrent episode of her neurocardiogenic syncope. Recommend continued follow up with PCP. Patient discharged in stable condition with understanding of reasons to return.     Final Clinical Impression(s) / ED Diagnoses Final diagnoses:  Syncope, unspecified syncope type    Rx / DC Orders ED Discharge Orders    None       Gareth Morgan, MD 02/29/20 1044

## 2020-03-25 ENCOUNTER — Telehealth: Payer: Self-pay | Admitting: Radiology

## 2020-03-25 ENCOUNTER — Telehealth: Payer: Self-pay | Admitting: Cardiology

## 2020-03-25 ENCOUNTER — Other Ambulatory Visit: Payer: Medicare HMO

## 2020-03-25 DIAGNOSIS — R55 Syncope and collapse: Secondary | ICD-10-CM

## 2020-03-25 NOTE — Telephone Encounter (Signed)
Please get a 30 day event monitor

## 2020-03-25 NOTE — Telephone Encounter (Signed)
Enrolled patient for a 30 day [employer] Event Monitor to be mailed to patients home  

## 2020-03-25 NOTE — Telephone Encounter (Signed)
Spoke with the patient who states that she went to the ER a few weeks ago because she passed out. She said that she has had these episodes that occur at times for about 5 or 6 years. She states that she was feeling fine after she went to the ER but the past few days has been feeling a weak and nauseous. She has been monitoring her HR and BP which have been running okay. Patient states that she has been staying hydrated. Also reports that her blood sugar has been running good. She currently is not dizzy and does not feel like she is going to pass out. I advised the patient to continue to monitor her symptoms and let us know if she starts feeling worse. She is aware to go to the ER if she passes out again.  Appointment with Dr. Radford Pax on 06/24.

## 2020-03-25 NOTE — Telephone Encounter (Signed)
  Pt c/o BP issue: STAT if pt c/o blurred vision, one-sided weakness or slurred speech  1. What are your last 5 BP readings? This morning 141/61HR 67, 164/69 HR 72, 145/59 HR 65, 153/79 HR 75, 183/85 HR 69, 156/65 HR 68  2. Are you having any other symptoms (ex. Dizziness, headache, blurred vision, passed out)? Dizziness, passed out 5/16   3. What is your BP issue? Patient states her BP has been low and she has been feeling dizzy and passed out 5/16. She states she went to the hospital the day she passed out, but she feels fine now. She states she has also been having nausea and diarrhea. She has a virtual appointment 6/24 and would like to know if she needs to be seen sooner in office.

## 2020-03-25 NOTE — Telephone Encounter (Signed)
Spoke with the patient and advised her of Dr. Theodosia Blender recommendation to wear a heart monitor for 30 days. Patient verbalized understanding and agrees with plan.

## 2020-04-05 ENCOUNTER — Ambulatory Visit (INDEPENDENT_AMBULATORY_CARE_PROVIDER_SITE_OTHER): Payer: Medicare HMO

## 2020-04-05 DIAGNOSIS — R55 Syncope and collapse: Secondary | ICD-10-CM

## 2020-04-08 ENCOUNTER — Encounter: Payer: Self-pay | Admitting: Cardiology

## 2020-04-08 ENCOUNTER — Other Ambulatory Visit: Payer: Self-pay

## 2020-04-08 ENCOUNTER — Telehealth (INDEPENDENT_AMBULATORY_CARE_PROVIDER_SITE_OTHER): Payer: Medicare HMO | Admitting: Cardiology

## 2020-04-08 VITALS — Ht 60.0 in | Wt 137.0 lb

## 2020-04-08 DIAGNOSIS — R42 Dizziness and giddiness: Secondary | ICD-10-CM | POA: Diagnosis not present

## 2020-04-08 DIAGNOSIS — R55 Syncope and collapse: Secondary | ICD-10-CM

## 2020-04-08 DIAGNOSIS — I6523 Occlusion and stenosis of bilateral carotid arteries: Secondary | ICD-10-CM

## 2020-04-08 DIAGNOSIS — E7800 Pure hypercholesterolemia, unspecified: Secondary | ICD-10-CM

## 2020-04-08 DIAGNOSIS — Q231 Congenital insufficiency of aortic valve: Secondary | ICD-10-CM

## 2020-04-08 DIAGNOSIS — I10 Essential (primary) hypertension: Secondary | ICD-10-CM | POA: Diagnosis not present

## 2020-04-08 DIAGNOSIS — I340 Nonrheumatic mitral (valve) insufficiency: Secondary | ICD-10-CM

## 2020-04-08 DIAGNOSIS — I2510 Atherosclerotic heart disease of native coronary artery without angina pectoris: Secondary | ICD-10-CM | POA: Diagnosis not present

## 2020-04-08 MED ORDER — AMLODIPINE BESYLATE 2.5 MG PO TABS
2.5000 mg | ORAL_TABLET | Freq: Every day | ORAL | 3 refills | Status: DC
Start: 2020-04-08 — End: 2020-12-14

## 2020-04-08 NOTE — Patient Instructions (Addendum)
Please take your blood pressure daily for one week and send [place] a MyChart message with your readings.   Medication Instructions:  Your physician has recommended you make the following change in your medication:  1) START taking amlodipine 2.5 mg every evening.   *If you need a refill on your cardiac medications before your next appointment, please call your pharmacy*   Testing/Procedures: Your physician has requested that you have an echocardiogram. Echocardiography is a painless test that uses sound waves to create images of your heart. It provides your doctor with information about the size and shape of your heart and how well your heart's chambers and valves are working. This procedure takes approximately one hour. There are no restrictions for this procedure.  Your physician has requested that you have a lexiscan myoview. For further information please visit [email or web address]. Please follow instruction sheet, as given.   Follow-Up: At [facility], you and your health needs are our priority.  As part of our continuing mission to provide you with exceptional heart care, we have created designated Provider Care Teams.  These Care Teams include your primary Cardiologist (physician) and Advanced Practice Providers (APPs -  Physician Assistants and Nurse Practitioners) who all work together to provide you with the care you need, when you need it.    Your next appointment:   3 month(s)  The format for your next appointment:   Either In Person or Virtual  Provider:   You may see Fransico Him, MD or one of the following Advanced Practice Providers on your designated Care Team:    Melina Copa, PA-C  Ermalinda Barrios, PA-C

## 2020-04-08 NOTE — Addendum Note (Signed)
Addended by: Antonieta Iba on: 04/08/2020 08:41 AM   Modules accepted: Orders

## 2020-04-08 NOTE — Progress Notes (Addendum)
Virtual Visit via Telephone Note   This visit type was conducted due to national recommendations for restrictions regarding the COVID-19 Pandemic (e.g. social distancing) in an effort to limit this patient's exposure and mitigate transmission in our community.  Due to her co-morbid illnesses, this patient is at least at moderate risk for complications without adequate follow up.  This format is felt to be most appropriate for this patient at this time.  The patient did not have access to video technology/had technical difficulties with video requiring transitioning to audio format only (telephone).  All issues noted in this document were discussed and addressed.  No physical exam could be performed with this format.  Please refer to the patient's chart for her  consent to telehealth for [facility].   Evaluation Performed:  Follow-up visit  This visit type was conducted due to national recommendations for restrictions regarding the COVID-19 Pandemic (e.g. social distancing).  This format is felt to be most appropriate for this patient at this time.  All issues noted in this document were discussed and addressed.  No physical exam was performed (except for noted visual exam findings with Video Visits).  Please refer to the patient's chart (MyChart message for video visits and phone note for telephone visits) for the patient's consent to telehealth for [facility].  Date:  04/08/2020   ID:  Molly Hill, DOB 22-Aug-1951, MRN [id number]  Patient Location:  Home  Provider location:   [place]  PCP:  Ernestene Kiel, MD  Cardiologist:  Fransico Him, MD  Electrophysiologist:  None   Chief Complaint:  CAD, HTN, HLD, AS, MR  History of Present Illness:    Molly Hill is a 69 y.o. female who presents via audio/video conferencing for a telehealth visit today.    This is a pleasant 69yo female with a hx of HTN, HLD, ASCAD s/p remote PCI of LAD in 2013 and heart murmur.  Her PCP  recently ordered an echo to followup on her murmur showing normal LVF with EF 60-65% with G2DD, mild LAE, degenerative thickening of the MV with mild to moderate MR and bicuspid AV with mild to moderate AR and mild AS.    She is here today for followup.  She recently was seen in the ER with syncope.  Her husband died in mid 04/06/23 and she had been feeling weak and nauseated.  She has a hx of vasovagal syncope in the past.  The morning of her syncope she had taken a Trazadone that am to help her calm down because her husband was in hospice and was dying that am.  She apparently was sitting on a chair and felt very "lifeless" and her daughter was there and was not responsive.  She said that she could hear but could not respond. Her BP was fine and BS was normal.  She says that she has been having some discomfort in her chest when she exerts herself.  She says that she feels SOB when she is exerting herself. She says that when she lays down to go to sleep her left arm will ache but not when she exerts herself.   She denies any chest pain or pressure, PND, orthopnea, LE edema,  palpitations. She has been wearing an event monitor currently.  She is compliant with her meds and is tolerating meds with no SE.    The patient does not have symptoms concerning for COVID-19 infection (fever, chills, cough, or new shortness of breath).  Prior CV studies:   The following studies were reviewed today:  none  Past Medical History:  Diagnosis Date  . Alopecia   . Anemia   . Bicuspid aortic valve    mild to moderate AR and mild AS by echo 2020  . Carotid stenosis    1-39% stenosis bilaterally by dopplers 07/2019  . Convalescence after chemotherapy   . Coronary arteriosclerosis in native artery 2013   s/p PCI of LAD  . DM (diabetes mellitus), type 1 ([place])   . Estrogen receptor negative   . History of breast cancer   . Hyperglycemia   . Hyperlipidemia   . Hypertension   . Mild dehydration   . Mitral  regurgitation    mild to moderate by echo 2020  . Murmur, heart   . Swelling of joint of both wrists   . Tendinitis of left wrist    Past Surgical History:  Procedure Laterality Date  . ANKLE SURGERY Bilateral   . CATARACT EXTRACTION, BILATERAL    . CHOLECYSTECTOMY    . CORONARY STENT INTERVENTION    . CTR    . ELBOW SURGERY    . HAND SURGERY     BOTH THUMBS AND MIDDLE FINGERS  . LEFT BREAST MASTECTOMY    . MASCULAR  REPAIR    . PORT-A-CATH REMOVAL    . PORTACATH PLACEMENT    . RIGHT MODIFIED MASTECTOMY Right   . UPPER LIP SURGERY       Current Meds  Medication Sig  . aspirin EC 81 MG tablet Take 81 mg by mouth daily.  Marland Kitchen atorvastatin (LIPITOR) 40 MG tablet Take 40 mg by mouth daily.  . Biotin w/ Vitamins C & E (HAIR/SKIN/NAILS PO) Take by mouth.  . Calcium Carb-Cholecalciferol (CALCIUM 600 + D PO) Take by mouth.  . celecoxib (CELEBREX) 200 MG capsule Take 200 mg by mouth 2 (two) times daily.  Marland Kitchen docusate sodium (COLACE) 100 MG capsule Take 100 mg by mouth daily as needed for mild constipation.  . fexofenadine (ALLEGRA) 180 MG tablet Take 180 mg by mouth as needed.   . fluticasone (FLONASE) 50 MCG/ACT nasal spray Place into both nostrils as needed.   Marland Kitchen glucose blood test strip 1 each by Other route as needed for other. Use as instructed  . insulin NPH Human (NOVOLIN N) 100 UNIT/ML injection Inject into the skin.  Marland Kitchen insulin regular (NOVOLIN R) 100 units/mL injection Inject 100 Units into the skin 3 (three) times daily before meals.  Marland Kitchen loperamide (IMODIUM) 1 MG/5ML solution Take by mouth as needed for diarrhea or loose stools.  . Metoprolol Succinate 25 MG CS24 Take 25 mg by mouth.  . nitroGLYCERIN (NITROSTAT) 0.4 MG SL tablet Place 0.4 mg under the tongue every 5 (five) minutes as needed for chest pain.  . ramipril (ALTACE) 10 MG capsule Take 10 mg by mouth daily.     Allergies:   Codeine and Reglan [metoclopramide]   Social History   Tobacco Use  . Smoking status: Former  Research scientist (life sciences)  . Smokeless tobacco: Never Used  Substance Use Topics  . Alcohol use: Never  . Drug use: Never     Family Hx: The patient's family history includes Breast cancer in her maternal aunt; CAD in her father; Colon cancer in her father; Diabetes in her brother, father, and paternal grandfather; Heart attack in her mother and paternal grandfather; Stroke in her father.  ROS:   Please see the history of present illness.  All other systems reviewed and are negative.   Labs/Other Tests and Data Reviewed:    Recent Labs: 12/24/2019: TSH 1.808 02/29/2020: ALT 14; BUN 30; Creatinine, Ser 0.98; Hemoglobin 12.2; Platelets 184; Potassium 3.9; Sodium 139   Recent Lipid Panel No results found for: CHOL, TRIG, HDL, CHOLHDL, LDLCALC, LDLDIRECT  Wt Readings from Last 3 Encounters:  04/08/20 137 lb (62.1 kg)  12/24/19 141 lb (64 kg)  09/04/19 144 lb (65.3 kg)     Objective:    Vital Signs:  Ht 5' (1.524 m)   Wt 137 lb (62.1 kg)   BMI 26.76 kg/m     ASSESSMENT & PLAN:    1.  Mitral regurgitation -2D echo 04/2019 showed mild to moderate MR -repeat echo to make sure this is stable  2.  Bicuspid AV -echo 2020 showed mild AS and mild to moderate AR -will check Chest CTA to rule out associated aortic aneurysms which can be seen with BAV -follow with yearly echo to assess for stability of AR and AS>>repeat echo 04/2020  3.  ASCAD -s/p PCI of LAD in 2013 -she denies any chest pain but is having exertional SOB which is new and some achiness in her left -I will get a Lexiscan myoview to rule out ischemia -continue ASA 81mg  daily, statin, BB  4.  Hyperlipidemia -LDL goal is < 70 -continue atorvastatin 40mg  daily -Check FLP and ALT  5.  HTN -Her BP has been [id number] systolic and HR in the low 60's -Continue Toprol XL 25mg  daily and Altace 10mg  daily -I will add amlodipine 2.5mg  daily at bedtime -check BP daily for a week and call  6. Carotid artery  stenosis -carotid dopplers 07/2019 showed 1-39% bilateral stenosis -repeat dopplers 07/2021  7.  Presyncope -? Etiology>> she did not lose consciousness but has a hx of vasovagal syncope -encouraged her to drink at least 64oz fluid daily -check 2D echo -event monitor is pending  Time:   Today, I have spent 20 minutes on telemedicine discussing medical problems including CAD, HTN, HLD, AS, MR and reviewing patient's chart including 2D echo.  Medication Adjustments/Labs and Tests Ordered: Current medicines are reviewed at length with the patient today.  Concerns regarding medicines are outlined above.  Tests Ordered: No orders of the defined types were placed in this encounter.  Medication Changes: No orders of the defined types were placed in this encounter.   Disposition:  Follow up 3 months  Signed, Fransico Him, MD  04/08/2020 8:33 AM    [place]

## 2020-04-27 DIAGNOSIS — H18513 Endothelial corneal dystrophy, bilateral: Secondary | ICD-10-CM | POA: Diagnosis not present

## 2020-04-27 DIAGNOSIS — H0102A Squamous blepharitis right eye, upper and lower eyelids: Secondary | ICD-10-CM | POA: Diagnosis not present

## 2020-04-27 DIAGNOSIS — H10413 Chronic giant papillary conjunctivitis, bilateral: Secondary | ICD-10-CM | POA: Diagnosis not present

## 2020-04-27 DIAGNOSIS — H353111 Nonexudative age-related macular degeneration, right eye, early dry stage: Secondary | ICD-10-CM | POA: Diagnosis not present

## 2020-04-27 DIAGNOSIS — H40013 Open angle with borderline findings, low risk, bilateral: Secondary | ICD-10-CM | POA: Diagnosis not present

## 2020-04-27 DIAGNOSIS — H4912 Fourth [trochlear] nerve palsy, left eye: Secondary | ICD-10-CM | POA: Diagnosis not present

## 2020-04-27 DIAGNOSIS — H35342 Macular cyst, hole, or pseudohole, left eye: Secondary | ICD-10-CM | POA: Diagnosis not present

## 2020-04-27 DIAGNOSIS — E119 Type 2 diabetes mellitus without complications: Secondary | ICD-10-CM | POA: Diagnosis not present

## 2020-04-27 DIAGNOSIS — H0102B Squamous blepharitis left eye, upper and lower eyelids: Secondary | ICD-10-CM | POA: Diagnosis not present

## 2020-04-28 ENCOUNTER — Encounter (HOSPITAL_COMMUNITY): Payer: Self-pay | Admitting: *Deleted

## 2020-04-28 ENCOUNTER — Telehealth (HOSPITAL_COMMUNITY): Payer: Self-pay | Admitting: *Deleted

## 2020-04-28 DIAGNOSIS — M316 Other giant cell arteritis: Secondary | ICD-10-CM | POA: Diagnosis not present

## 2020-04-28 NOTE — Telephone Encounter (Signed)
Instructions for upcoming Myoview on 05/03/20 @ 8:00 sent via MyChart per patients request.

## 2020-05-03 ENCOUNTER — Ambulatory Visit (HOSPITAL_COMMUNITY): Payer: Medicare HMO | Attending: Cardiovascular Disease

## 2020-05-03 ENCOUNTER — Other Ambulatory Visit: Payer: Self-pay

## 2020-05-03 ENCOUNTER — Telehealth: Payer: Self-pay

## 2020-05-03 ENCOUNTER — Other Ambulatory Visit: Payer: Medicare HMO | Admitting: *Deleted

## 2020-05-03 ENCOUNTER — Encounter: Payer: Self-pay | Admitting: Cardiology

## 2020-05-03 ENCOUNTER — Ambulatory Visit (HOSPITAL_BASED_OUTPATIENT_CLINIC_OR_DEPARTMENT_OTHER): Payer: Medicare HMO

## 2020-05-03 DIAGNOSIS — Q231 Congenital insufficiency of aortic valve: Secondary | ICD-10-CM

## 2020-05-03 DIAGNOSIS — E7800 Pure hypercholesterolemia, unspecified: Secondary | ICD-10-CM

## 2020-05-03 DIAGNOSIS — I6523 Occlusion and stenosis of bilateral carotid arteries: Secondary | ICD-10-CM | POA: Diagnosis not present

## 2020-05-03 DIAGNOSIS — R42 Dizziness and giddiness: Secondary | ICD-10-CM

## 2020-05-03 DIAGNOSIS — I2510 Atherosclerotic heart disease of native coronary artery without angina pectoris: Secondary | ICD-10-CM

## 2020-05-03 DIAGNOSIS — I340 Nonrheumatic mitral (valve) insufficiency: Secondary | ICD-10-CM | POA: Insufficient documentation

## 2020-05-03 DIAGNOSIS — I10 Essential (primary) hypertension: Secondary | ICD-10-CM | POA: Diagnosis not present

## 2020-05-03 DIAGNOSIS — R55 Syncope and collapse: Secondary | ICD-10-CM

## 2020-05-03 LAB — MYOCARDIAL PERFUSION IMAGING
LV dias vol: 63 mL (ref 46–106)
LV sys vol: 21 mL
Peak HR: 100 {beats}/min
Rest HR: 62 {beats}/min
SDS: 1
SRS: 0
SSS: 1
TID: 1.01

## 2020-05-03 LAB — LIPID PANEL
Chol/HDL Ratio: 2.1 ratio (ref 0.0–4.4)
Cholesterol, Total: 127 mg/dL (ref 100–199)
HDL: 60 mg/dL (ref >39)
LDL Chol Calc (NIH): 48 mg/dL (ref 0–99)
Triglycerides: 102 mg/dL (ref 0–149)
VLDL Cholesterol Cal: 19 mg/dL (ref 5–40)

## 2020-05-03 LAB — ECHOCARDIOGRAM COMPLETE
AR max vel: 0.8 cm2
AV Area VTI: 0.82 cm2
AV Area mean vel: 0.83 cm2
AV Mean grad: 21 mmHg
AV Peak grad: 36.7 mmHg
Ao pk vel: 3.03 m/s
Area-P 1/2: 3.12 cm2
Height: 60 in
P 1/2 time: 684 msec
S' Lateral: 2.3 cm
Weight: 2192 oz

## 2020-05-03 LAB — ALT: ALT: 12 IU/L (ref 0–32)

## 2020-05-03 MED ORDER — TECHNETIUM TC 99M TETROFOSMIN IV KIT
32.3000 | PACK | Freq: Once | INTRAVENOUS | Status: AC | PRN
  Administered 2020-05-03: 32.3 via INTRAVENOUS
  Filled 2020-05-03: qty 33

## 2020-05-03 MED ORDER — TECHNETIUM TC 99M TETROFOSMIN IV KIT
10.2000 | PACK | Freq: Once | INTRAVENOUS | Status: AC | PRN
  Administered 2020-05-03: 10.2 via INTRAVENOUS
  Filled 2020-05-03: qty 11

## 2020-05-03 MED ORDER — REGADENOSON 0.4 MG/5ML IV SOLN
0.4000 mg | Freq: Once | INTRAVENOUS | Status: AC
  Administered 2020-05-03: 0.4 mg via INTRAVENOUS

## 2020-05-03 NOTE — Telephone Encounter (Signed)
Lab orders placed.  

## 2020-05-04 ENCOUNTER — Telehealth: Payer: Self-pay

## 2020-05-04 DIAGNOSIS — I350 Nonrheumatic aortic (valve) stenosis: Secondary | ICD-10-CM

## 2020-05-04 DIAGNOSIS — I340 Nonrheumatic mitral (valve) insufficiency: Secondary | ICD-10-CM

## 2020-05-04 DIAGNOSIS — Q231 Congenital insufficiency of aortic valve: Secondary | ICD-10-CM

## 2020-05-04 NOTE — Telephone Encounter (Signed)
-----   Message from Sueanne Margarita, MD sent at 05/03/2020 11:52 AM EDT ----- 2D echo showed normal heart function with mildly thickened heart muscle, mild to moderate leakiness of MV, mildly leaky AV, moderate AS.  AS has progressed some since last study.  Repeat echo in 6 months for AS

## 2020-05-04 NOTE — Telephone Encounter (Signed)
The patient has been notified of the result and verbalized understanding.  All questions (if any) were answered. Antonieta Iba, RN 05/04/2020 11:10 AM  Orders for echocardiogram placed.

## 2020-05-10 DIAGNOSIS — Z6828 Body mass index (BMI) 28.0-28.9, adult: Secondary | ICD-10-CM | POA: Diagnosis not present

## 2020-05-10 DIAGNOSIS — E10649 Type 1 diabetes mellitus with hypoglycemia without coma: Secondary | ICD-10-CM | POA: Diagnosis not present

## 2020-05-10 DIAGNOSIS — B351 Tinea unguium: Secondary | ICD-10-CM | POA: Diagnosis not present

## 2020-05-11 DIAGNOSIS — H4912 Fourth [trochlear] nerve palsy, left eye: Secondary | ICD-10-CM | POA: Diagnosis not present

## 2020-05-13 ENCOUNTER — Other Ambulatory Visit: Payer: Self-pay | Admitting: Ophthalmology

## 2020-05-13 DIAGNOSIS — H4913 Fourth [trochlear] nerve palsy, bilateral: Secondary | ICD-10-CM

## 2020-05-24 ENCOUNTER — Other Ambulatory Visit: Payer: Self-pay

## 2020-05-24 ENCOUNTER — Inpatient Hospital Stay (HOSPITAL_BASED_OUTPATIENT_CLINIC_OR_DEPARTMENT_OTHER): Payer: Medicare HMO | Admitting: Internal Medicine

## 2020-05-24 ENCOUNTER — Encounter: Payer: Self-pay | Admitting: Internal Medicine

## 2020-05-24 ENCOUNTER — Inpatient Hospital Stay: Payer: Medicare HMO | Attending: Internal Medicine

## 2020-05-24 DIAGNOSIS — E785 Hyperlipidemia, unspecified: Secondary | ICD-10-CM | POA: Diagnosis not present

## 2020-05-24 DIAGNOSIS — R531 Weakness: Secondary | ICD-10-CM | POA: Insufficient documentation

## 2020-05-24 DIAGNOSIS — M2550 Pain in unspecified joint: Secondary | ICD-10-CM | POA: Diagnosis not present

## 2020-05-24 DIAGNOSIS — G4700 Insomnia, unspecified: Secondary | ICD-10-CM | POA: Diagnosis not present

## 2020-05-24 DIAGNOSIS — I10 Essential (primary) hypertension: Secondary | ICD-10-CM | POA: Insufficient documentation

## 2020-05-24 DIAGNOSIS — Z171 Estrogen receptor negative status [ER-]: Secondary | ICD-10-CM | POA: Diagnosis not present

## 2020-05-24 DIAGNOSIS — D649 Anemia, unspecified: Secondary | ICD-10-CM | POA: Diagnosis not present

## 2020-05-24 DIAGNOSIS — Z79899 Other long term (current) drug therapy: Secondary | ICD-10-CM | POA: Diagnosis not present

## 2020-05-24 DIAGNOSIS — I2510 Atherosclerotic heart disease of native coronary artery without angina pectoris: Secondary | ICD-10-CM | POA: Diagnosis not present

## 2020-05-24 DIAGNOSIS — Z794 Long term (current) use of insulin: Secondary | ICD-10-CM | POA: Insufficient documentation

## 2020-05-24 DIAGNOSIS — Z87891 Personal history of nicotine dependence: Secondary | ICD-10-CM | POA: Diagnosis not present

## 2020-05-24 DIAGNOSIS — Z7982 Long term (current) use of aspirin: Secondary | ICD-10-CM | POA: Insufficient documentation

## 2020-05-24 DIAGNOSIS — R011 Cardiac murmur, unspecified: Secondary | ICD-10-CM | POA: Insufficient documentation

## 2020-05-24 DIAGNOSIS — Z853 Personal history of malignant neoplasm of breast: Secondary | ICD-10-CM | POA: Insufficient documentation

## 2020-05-24 DIAGNOSIS — R5383 Other fatigue: Secondary | ICD-10-CM | POA: Insufficient documentation

## 2020-05-24 DIAGNOSIS — I340 Nonrheumatic mitral (valve) insufficiency: Secondary | ICD-10-CM | POA: Diagnosis not present

## 2020-05-24 DIAGNOSIS — E119 Type 2 diabetes mellitus without complications: Secondary | ICD-10-CM | POA: Insufficient documentation

## 2020-05-24 DIAGNOSIS — Z800 Family history of malignant neoplasm of digestive organs: Secondary | ICD-10-CM | POA: Insufficient documentation

## 2020-05-24 DIAGNOSIS — Z9011 Acquired absence of right breast and nipple: Secondary | ICD-10-CM | POA: Diagnosis not present

## 2020-05-24 DIAGNOSIS — C50811 Malignant neoplasm of overlapping sites of right female breast: Secondary | ICD-10-CM | POA: Diagnosis not present

## 2020-05-24 LAB — CBC WITH DIFFERENTIAL/PLATELET
Abs Immature Granulocytes: 0.03 10*3/uL (ref 0.00–0.07)
Basophils Absolute: 0 10*3/uL (ref 0.0–0.1)
Basophils Relative: 0 %
Eosinophils Absolute: 0.3 10*3/uL (ref 0.0–0.5)
Eosinophils Relative: 3 %
HCT: 35.1 % — ABNORMAL LOW (ref 36.0–46.0)
Hemoglobin: 11.8 g/dL — ABNORMAL LOW (ref 12.0–15.0)
Immature Granulocytes: 0 %
Lymphocytes Relative: 22 %
Lymphs Abs: 1.9 10*3/uL (ref 0.7–4.0)
MCH: 31.1 pg (ref 26.0–34.0)
MCHC: 33.6 g/dL (ref 30.0–36.0)
MCV: 92.4 fL (ref 80.0–100.0)
Monocytes Absolute: 0.8 10*3/uL (ref 0.1–1.0)
Monocytes Relative: 9 %
Neutro Abs: 5.7 10*3/uL (ref 1.7–7.7)
Neutrophils Relative %: 66 %
Platelets: 208 10*3/uL (ref 150–400)
RBC: 3.8 MIL/uL — ABNORMAL LOW (ref 3.87–5.11)
RDW: 12.3 % (ref 11.5–15.5)
WBC: 8.7 10*3/uL (ref 4.0–10.5)
nRBC: 0 % (ref 0.0–0.2)

## 2020-05-24 LAB — COMPREHENSIVE METABOLIC PANEL
ALT: 27 U/L (ref 0–44)
AST: 37 U/L (ref 15–41)
Albumin: 3.9 g/dL (ref 3.5–5.0)
Alkaline Phosphatase: 119 U/L (ref 38–126)
Anion gap: 10 (ref 5–15)
BUN: 29 mg/dL — ABNORMAL HIGH (ref 8–23)
CO2: 28 mmol/L (ref 22–32)
Calcium: 9.2 mg/dL (ref 8.9–10.3)
Chloride: 100 mmol/L (ref 98–111)
Creatinine, Ser: 1.12 mg/dL — ABNORMAL HIGH (ref 0.44–1.00)
GFR calc Af Amer: 58 mL/min — ABNORMAL LOW (ref >60)
GFR calc non Af Amer: 50 mL/min — ABNORMAL LOW (ref >60)
Glucose, Bld: 225 mg/dL — ABNORMAL HIGH (ref 70–99)
Potassium: 4.5 mmol/L (ref 3.5–5.1)
Sodium: 138 mmol/L (ref 135–145)
Total Bilirubin: 0.7 mg/dL (ref 0.3–1.2)
Total Protein: 7 g/dL (ref 6.5–8.1)

## 2020-05-24 LAB — LACTATE DEHYDROGENASE: LDH: 188 U/L (ref 98–192)

## 2020-05-24 NOTE — Progress Notes (Signed)
[facility] CONSULT NOTE  Patient Care Team: Ernestene Kiel, MD as PCP - General (General Practice) Sueanne Margarita, MD as PCP - Cardiology (Cardiology) Cammie Sickle, MD as Consulting Physician (Hematology and Oncology)  CHIEF COMPLAINTS/PURPOSE OF CONSULTATION: breast cancer  #  Oncology History Overview Note  #September 2016-multifocal right breast cancer triple negative [ER/PR HER-2 negative; [facility]]-PET-no metastatic disease; dose dense Adriamycin followed by weekly Taxol.  November 05, 2013-right mastectomy- [id number]-complete pathologic response.  status post adjuvant right ax.radiation Left breast cancer- as pt/reocds s/p mastectomy-no malignancy noted.    # DM- on insulin [since 3716]  # SURVIVORSHIP:   # GENETICS: NEG as per pt.   DIAGNOSIS: Right breast cancer  STAGE: early stage        ;  GOALS: cure  CURRENT/MOST RECENT THERAPY : Surveillance      Carcinoma of overlapping sites of right breast in female, estrogen receptor negative ([place])  12/24/2019 Initial Diagnosis   Carcinoma of overlapping sites of right breast in female, estrogen receptor negative ([place])      HISTORY OF PRESENTING ILLNESS:   Molly Hill 69 y.o.  female above history of bilateral triple negative breast cancer is here for follow-up.  Unfortunately her husband passed away in May-Parkinson's disease multiple comorbidities.  Patient is coping up with her loss fairly well.  Patient denies any new lumps or bumps.  Appetite is good but no weight loss but no nausea vomiting.  Complains of mild fatigue.  Chronic mild joint pains.  Review of Systems  Constitutional: Positive for malaise/fatigue. Negative for chills, diaphoresis, fever and weight loss.  HENT: Negative for nosebleeds and sore throat.   Eyes: Negative for double vision.  Respiratory: Negative for cough, hemoptysis, sputum production, shortness of breath and wheezing.    Cardiovascular: Negative for chest pain, palpitations, orthopnea and leg swelling.  Gastrointestinal: Negative for abdominal pain, blood in stool, constipation, diarrhea, heartburn, melena, nausea and vomiting.  Genitourinary: Negative for dysuria, frequency and urgency.  Musculoskeletal: Positive for joint pain. Negative for back pain.  Skin: Negative.  Negative for itching and [place].  Neurological: Negative for dizziness, tingling, focal weakness, weakness and headaches.  Endo/Heme/Allergies: Does not bruise/bleed easily.  Psychiatric/Behavioral: Negative for depression. The patient is not nervous/anxious and does not have insomnia.      MEDICAL HISTORY:  Past Medical History:  Diagnosis Date  . Alopecia   . Anemia   . Bicuspid aortic valve    mild to moderate AR and moderate AS by echo 2021  . Carotid stenosis    1-39% stenosis bilaterally by dopplers 07/2019  . Convalescence after chemotherapy   . Coronary arteriosclerosis in native artery 2013   s/p PCI of LAD  . DM (diabetes mellitus), type 1 ([place])   . Estrogen receptor negative   . History of breast cancer   . Hyperglycemia   . Hyperlipidemia   . Hypertension   . Mild dehydration   . Mitral regurgitation    mild to moderate by echo 2020  . Murmur, heart   . Swelling of joint of both wrists   . Tendinitis of left wrist     SURGICAL HISTORY: Past Surgical History:  Procedure Laterality Date  . ANKLE SURGERY Bilateral   . CATARACT EXTRACTION, BILATERAL    . CHOLECYSTECTOMY    . CORONARY STENT INTERVENTION    . CTR    . ELBOW SURGERY    . HAND SURGERY     BOTH  THUMBS AND MIDDLE FINGERS  . LEFT BREAST MASTECTOMY    . MASCULAR  REPAIR    . PORT-A-CATH REMOVAL    . PORTACATH PLACEMENT    . RIGHT MODIFIED MASTECTOMY Right   . UPPER LIP SURGERY      SOCIAL HISTORY: Social History   Socioeconomic History  . Marital status: Married    Spouse name: Not on file  . Number of children: Not on file  . Years of  education: Not on file  . Highest education level: Not on file  Occupational History  . Not on file  Tobacco Use  . Smoking status: Former Research scientist (life sciences)  . Smokeless tobacco: Never Used  Substance and Sexual Activity  . Alcohol use: Never  . Drug use: Never  . Sexual activity: Never  Other Topics Concern  . Not on file  Social History Narrative  . Not on file   Social Determinants of Health   Financial Resource Strain:   . Difficulty of Paying Living Expenses:   Food Insecurity:   . Worried About Charity fundraiser in the Last Year:   . Arboriculturist in the Last Year:   Transportation Needs:   . Film/video editor (Medical):   Marland Kitchen Lack of Transportation (Non-Medical):   Physical Activity:   . Days of Exercise per Week:   . Minutes of Exercise per Session:   Stress:   . Feeling of Stress :   Social Connections:   . Frequency of Communication with Friends and Family:   . Frequency of Social Gatherings with Friends and Family:   . Attends Religious Services:   . Active Member of Clubs or Organizations:   . Attends Archivist Meetings:   Marland Kitchen Marital Status:   Intimate Partner Violence:   . Fear of Current or Ex-Partner:   . Emotionally Abused:   Marland Kitchen Physically Abused:   . Sexually Abused:     FAMILY HISTORY: Family History  Problem Relation Age of Onset  . Heart attack Mother   . CAD Father        CABG  . Diabetes Father   . Stroke Father   . Colon cancer Father        38s  . Diabetes Brother   . Diabetes Paternal Grandfather   . Heart attack Paternal Grandfather   . Breast cancer Maternal Aunt        40s    ALLERGIES:  is allergic to codeine and reglan [metoclopramide].  MEDICATIONS:  Current Outpatient Medications  Medication Sig Dispense Refill  . amLODipine (NORVASC) 2.5 MG tablet Take 1 tablet (2.5 mg total) by mouth daily. 90 tablet 3  . aspirin EC 81 MG tablet Take 81 mg by mouth daily.    Marland Kitchen atorvastatin (LIPITOR) 40 MG tablet Take 40 mg by  mouth daily.    . Biotin w/ Vitamins C & E (HAIR/SKIN/NAILS PO) Take by mouth.    . Calcium Carb-Cholecalciferol (CALCIUM 600 + D PO) Take by mouth.    . celecoxib (CELEBREX) 200 MG capsule Take 200 mg by mouth 2 (two) times daily.    Marland Kitchen docusate sodium (COLACE) 100 MG capsule Take 100 mg by mouth daily as needed for mild constipation.    . fexofenadine (ALLEGRA) 180 MG tablet Take 180 mg by mouth as needed.     . fluticasone (FLONASE) 50 MCG/ACT nasal spray Place into both nostrils as needed.     Marland Kitchen glucose blood test strip 1 each  by Other route as needed for other. Use as instructed    . insulin NPH Human (NOVOLIN N) 100 UNIT/ML injection Inject into the skin.    Marland Kitchen insulin regular (NOVOLIN R) 100 units/mL injection Inject 100 Units into the skin 3 (three) times daily before meals.    Marland Kitchen loperamide (IMODIUM) 1 MG/5ML solution Take by mouth as needed for diarrhea or loose stools.    . Metoprolol Succinate 25 MG CS24 Take 25 mg by mouth.    . nitroGLYCERIN (NITROSTAT) 0.4 MG SL tablet Place 0.4 mg under the tongue every 5 (five) minutes as needed for chest pain.    . ramipril (ALTACE) 10 MG capsule Take 10 mg by mouth daily.    . traZODone (DESYREL) 50 MG tablet Take 50 mg by mouth 3 (three) times daily. (Patient not taking: Reported on 04/08/2020)     No current facility-administered medications for this visit.      Marland Kitchen  PHYSICAL EXAMINATION: ECOG PERFORMANCE STATUS: 0 - Asymptomatic  Vitals:   05/24/20 1348  BP: (!) 166/59  Pulse: 70  Resp: 16  Temp: 98.1 F (36.7 C)  SpO2: 100%   Filed Weights   05/24/20 1348  Weight: 146 lb (66.2 kg)    Physical Exam HENT:     Head: Normocephalic and atraumatic.     Mouth/Throat:     Pharynx: No oropharyngeal exudate.  Eyes:     Pupils: Pupils are equal, round, and reactive to light.  Cardiovascular:     Rate and Rhythm: Normal rate and regular rhythm.  Pulmonary:     Effort: No respiratory distress.     Breath sounds: No wheezing.   Abdominal:     General: Bowel sounds are normal. There is no distension.     Palpations: Abdomen is soft. There is no mass.     Tenderness: There is no abdominal tenderness. There is no guarding or rebound.  Musculoskeletal:        General: No tenderness. Normal range of motion.     Cervical back: Normal range of motion and neck supple.  Skin:    General: Skin is warm.     Comments: Bilateral mastectomies noted.  No recurrence.  Neurological:     Mental Status: She is alert and oriented to person, place, and time.  Psychiatric:        Mood and Affect: Affect normal.      LABORATORY DATA:  I have reviewed the data as listed Lab Results  Component Value Date   WBC 8.7 05/24/2020   HGB 11.8 (L) 05/24/2020   HCT 35.1 (L) 05/24/2020   MCV 92.4 05/24/2020   PLT 208 05/24/2020   Recent Labs    12/24/19 1211 12/24/19 1211 01/21/20 1123 02/29/20 0802 05/03/20 1116 05/24/20 1334  NA 136   < > 134* 139  --  138  K 4.3   < > 4.7 3.9  --  4.5  CL 99   < > 96* 99  --  100  CO2 26   < > 29 25  --  28  GLUCOSE 160*   < > 364* 114*  --  225*  BUN 25*   < > 27* 30*  --  29*  CREATININE 0.72   < > 0.93 0.98  --  1.12*  CALCIUM 9.3   < > 9.5 9.4  --  9.2  GFRNONAA >60   < > >60 59*  --  50*  GFRAA >60   < > >  60 >60  --  58*  PROT 7.2  --   --  6.7  --  7.0  ALBUMIN 4.2  --   --  3.7  --  3.9  AST 22  --   --  24  --  37  ALT 14   < >  --  _0 ALKPHOS 82  --   --  91  --  119  BILITOT 0.9  --   --  0.5  --  0.7   < > = values in this interval not displayed.    RADIOGRAPHIC STUDIES: I have personally reviewed the radiological images as listed and agreed with the findings in the report. CARDIAC EVENT MONITOR  Result Date: 05/06/2020  Sinus bradycardia, normal sinus rhythm and sinus tachycardia. The average heart rate was 73bpm and ranged from 54-153bpm.  Wide complex tachycardia consistent with nonsustained ventricular tachycardia for 4 beats.    MYOCARDIAL PERFUSION  IMAGING  Result Date: 05/03/2020  <41m Horizontal ST segment depression was noted during stress.  Nuclear stress EF: 67%.  The left ventricular ejection fraction is hyperdynamic (>65%).  The study is normal.  This is a low risk study.    ECHOCARDIOGRAM COMPLETE  Result Date: 05/03/2020    ECHOCARDIOGRAM REPORT   Patient Name:   DVERNETTE MOISEDate of Exam: 05/03/2020 Medical Rec #:  [id number]     Height:       60.0 in Accession #:    [id number]    Weight:       137.0 lb Date of Birth:  [birth year]    BSA:          1.589 m Patient Age:    89years       BP:           157/76 mmHg Patient Gender: F              HR:           71 bpm. Exam Location:  [address] Procedure: 2D Echo, Cardiac Doppler and Color Doppler Indications:    I25.10  History:        Patient has prior history of Echocardiogram examinations, most                 recent 04/29/2019. Aortic Valve Disease and Mitral Valve Disease;                 Risk Factors:Hypertension and Dyslipidemia.  Sonographer:    WCoralyn HellingRDCS Referring Phys: 1[place] 1. Left ventricular ejection fraction, by estimation, is 60 to 65%. The left ventricle has normal function. The left ventricle has no regional wall motion abnormalities. There is mild asymmetric left ventricular hypertrophy of the basal-septal segment. Left ventricular diastolic function could not be evaluated.  2. Right ventricular systolic function is normal. The right ventricular size is normal. There is normal pulmonary artery systolic pressure. The estimated right ventricular systolic pressure is 344.5mmHg.  3. The mitral valve is degenerative. Mild to moderate mitral valve regurgitation.  4. The aortic valve has an indeterminant number of cusps. Aortic valve regurgitation is mild. Moderate aortic valve stenosis. Aortic valve mean gradient measures 21.0 mmHg. Aortic valve Vmax measures 3.03 m/s.  5. The inferior vena cava is normal in size with greater than 50%  respiratory variability, suggesting right atrial pressure of 3 mmHg. Comparison(s): Changes from prior study are noted. Aortic stenosis is now  moderate. MR remains mild to moderate. Indeterminate diastology due to moderate MAC. FINDINGS  Left Ventricle: Left ventricular ejection fraction, by estimation, is 60 to 65%. The left ventricle has normal function. The left ventricle has no regional wall motion abnormalities. The left ventricular internal cavity size was normal in size. There is  mild asymmetric left ventricular hypertrophy of the basal-septal segment. Left ventricular diastolic function could not be evaluated due to mitral annular calcification (moderate or greater). Left ventricular diastolic function could not be evaluated. Right Ventricle: The right ventricular size is normal. No increase in right ventricular wall thickness. Right ventricular systolic function is normal. There is normal pulmonary artery systolic pressure. The tricuspid regurgitant velocity is 2.71 m/s, and  with an assumed right atrial pressure of 3 mmHg, the estimated right ventricular systolic pressure is 02.7 mmHg. Left Atrium: Left atrial size was normal in size. Right Atrium: Right atrial size was normal in size. Pericardium: There is no evidence of pericardial effusion. Mitral Valve: The mitral valve is degenerative in appearance. Moderate mitral annular calcification. Mild to moderate mitral valve regurgitation. Tricuspid Valve: The tricuspid valve is grossly normal. Tricuspid valve regurgitation is trivial. No evidence of tricuspid stenosis. Aortic Valve: The aortic valve has an indeterminant number of cusps. . There is moderate thickening and moderate calcification of the aortic valve. Aortic valve regurgitation is mild. Aortic regurgitation PHT measures 684 msec. Moderate aortic stenosis is present. There is moderate thickening of the aortic valve. There is moderate calcification of the aortic valve. Aortic valve mean gradient  measures 21.0 mmHg. Aortic valve peak gradient measures 36.7 mmHg. Aortic valve area, by VTI measures 0.82 cm. Pulmonic Valve: The pulmonic valve was grossly normal. Pulmonic valve regurgitation is trivial. No evidence of pulmonic stenosis. Aorta: The aortic root and ascending aorta are structurally normal, with no evidence of dilitation. Venous: The inferior vena cava is normal in size with greater than 50% respiratory variability, suggesting right atrial pressure of 3 mmHg. IAS/Shunts: The atrial septum is grossly normal.  LEFT VENTRICLE PLAX 2D LVIDd:         3.90 cm  Diastology LVIDs:         2.30 cm  LV e' lateral:   6.64 cm/s LV PW:         1.00 cm  LV E/e' lateral: 25.6 LV IVS:        1.40 cm  LV e' medial:    5.44 cm/s LVOT diam:     1.80 cm  LV E/e' medial:  31.2 LV SV:         66 LV SV Index:   41 LVOT Area:     2.54 cm  RIGHT VENTRICLE             IVC RV S prime:     10.00 cm/s  IVC diam: 1.10 cm TAPSE (M-mode): 1.6 cm RVSP:           32.4 mmHg LEFT ATRIUM             Index       RIGHT ATRIUM           Index LA diam:        3.70 cm 2.33 cm/m  RA Pressure: 3.00 mmHg LA Vol (A2C):   52.6 ml 33.10 ml/m RA Area:     10.50 cm LA Vol (A4C):   51.4 ml 32.34 ml/m RA Volume:   21.10 ml  13.28 ml/m LA Biplane Vol: 53.3 ml 33.54 ml/m  AORTIC VALVE AV Area (Vmax):    0.80 cm AV Area (Vmean):   0.83 cm AV Area (VTI):     0.82 cm AV Vmax:           303.00 cm/s AV Vmean:          207.500 cm/s AV VTI:            0.804 m AV Peak Grad:      36.7 mmHg AV Mean Grad:      21.0 mmHg LVOT Vmax:         95.80 cm/s LVOT Vmean:        67.800 cm/s LVOT VTI:          0.258 m LVOT/AV VTI ratio: 0.32 AI PHT:            684 msec  AORTA Ao Root diam: 2.70 cm Ao Asc diam:  2.90 cm MV E velocity: 170.00 cm/s  TRICUSPID VALVE MV A velocity: 131.00 cm/s  TR Peak grad:   29.4 mmHg MV E/A ratio:  1.30         TR Vmax:        271.00 cm/s                             Estimated RAP:  3.00 mmHg                             RVSP:            32.4 mmHg                              SHUNTS                             Systemic VTI:  0.26 m                             Systemic Diam: 1.80 cm Eleonore Chiquito MD Electronically signed by Eleonore Chiquito MD Signature Date/Time: 05/03/2020/11:14:50 AM    Final     ASSESSMENT & PLAN:   Carcinoma of overlapping sites of right breast in female, estrogen receptor negative ([place]) Bilateral breast cancer triple negative [September 2016]- stable.  No evidence of recurrence.  Continue surveillance.  #Anemia mild hemoglobin 11.4-overall stable.  No evidence of iron deficiency.  Monitor closely.  # Surveillance for osteoporosis-awaiting bone density with PCP in next 1-2 weeks.   # Insomnia- husband passed away; ? Recommend Melatonin qhs prn.   # DISPOSITION: # Follow up in 6 months- MD; labs- cbc/cmp-Dr.B     All questions were answered. The patient knows to call the clinic with any problems, questions or concerns.    Cammie Sickle, MD 05/24/2020 3:26 PM

## 2020-05-24 NOTE — Assessment & Plan Note (Addendum)
Bilateral breast cancer triple negative [September 2016]- stable.  No evidence of recurrence.  Continue surveillance.  #Anemia mild hemoglobin 11.4-overall stable.  No evidence of iron deficiency.  Monitor closely.  # Surveillance for osteoporosis-awaiting bone density with PCP in next 1-2 weeks.   # Insomnia- husband passed away; ? Recommend Melatonin qhs prn.   # DISPOSITION: # Follow up in 6 months- MD; labs- cbc/cmp-Dr.B

## 2020-05-25 ENCOUNTER — Ambulatory Visit: Payer: Medicare HMO | Admitting: Podiatry

## 2020-05-25 DIAGNOSIS — E109 Type 1 diabetes mellitus without complications: Secondary | ICD-10-CM | POA: Diagnosis not present

## 2020-05-25 DIAGNOSIS — B351 Tinea unguium: Secondary | ICD-10-CM | POA: Diagnosis not present

## 2020-05-25 MED ORDER — EFINACONAZOLE 10 % EX SOLN: 1.0000 [drp] | Freq: Every day | CUTANEOUS | 11 refills | Status: DC

## 2020-05-25 NOTE — Patient Instructions (Signed)
Efinaconazole Topical Solution What is this medicine? EFINACONAZOLE (e FEE na KON a zole) is an antifungal medicine. It is used to treat certain kinds of fungal infections of the toenail. This medicine may be used for other purposes; ask your health care provider or pharmacist if you have questions. COMMON BRAND NAME(S): JUBLIA What should I tell my health care provider before I take this medicine? They need to know if you have any of these conditions:  an unusual or allergic reaction to efinaconazole, other medicines, foods, dyes or preservatives  pregnant or trying to get pregnant  breast-feeding How should I use this medicine? This medicine is for external use only. Do not take by mouth. Follow the directions on the label. Wash hands before and after use. Apply this medicine using the provided brush to cover the entire toenail. Do not use your medicine more often than directed. Finish the full course prescribed by your doctor or health care professional even if you think your condition is better. Do not stop using except on the advice of your doctor or health care professional. Talk to your pediatrician regarding the use of this medicine in children. While this drug may be prescribed for children as young as 6 years for selected conditions, precautions do apply. Overdosage: If you think you have taken too much of this medicine contact a poison control center or emergency room at once. NOTE: This medicine is only for you. Do not share this medicine with others. What if I miss a dose? If you miss a dose, use it as soon as you can. If it is almost time for your next dose, use only that dose. Do not use double or extra doses. What may interact with this medicine? Interactions have not been studied. Do not use any other nail products (i.e., nail polish, pedicures) during treatment with this medicine. This list may not describe all possible interactions. Give your health care provider a list of  all the medicines, herbs, non-prescription drugs, or dietary supplements you use. Also tell them if you smoke, drink alcohol, or use illegal drugs. Some items may interact with your medicine. What should I watch for while using this medicine? Do not get this medicine in your eyes. If you do, rinse out with plenty of cool tap water. Tell your doctor or health care professional if your symptoms do not start to get better or if they get worse. Wait for at least 10 minutes after bathing before applying this medication. After bathing, make sure that your feet are very dry. Fungal infections like moist conditions. Do not walk around barefoot. To help prevent reinfection, wear freshly washed cotton, not synthetic clothing. Tell your doctor or health care professional if you develop sores or blisters that do not heal properly. If your nail infection returns after you stop using this medicine, contact your doctor or health care professional. What side effects may I notice from receiving this medicine? Side effects that you should report to your doctor or health care professional as soon as possible:  allergic reactions like skin [place], itching or hives, swelling of the face, lips, or tongue  ingrown toenail Side effects that usually do not require medical attention (report to your doctor or health care professional if they continue or are bothersome):  mild skin irritation, burning, or itching This list may not describe all possible side effects. Call your doctor for medical advice about side effects. You may report side effects to FDA at 1-800-FDA-1088. Where should I  keep my medicine? Keep out of the reach of children. Store at room temperature between 20 and 25 degrees C (68 and 77 degrees F). Keep this medicine in the original container. Throw away any unused medicine after the expiration date. This medicine is flammable. Avoid exposure to heat, fire, flame, and smoking. NOTE: This sheet is a summary.  It may not cover all possible information. If you have questions about this medicine, talk to your doctor, pharmacist, or health care provider.  2020 Elsevier/Gold Standard (2019-02-10 16:14:11)  

## 2020-05-31 ENCOUNTER — Telehealth: Payer: Self-pay | Admitting: Podiatry

## 2020-05-31 ENCOUNTER — Encounter: Payer: Self-pay | Admitting: Podiatry

## 2020-05-31 DIAGNOSIS — B351 Tinea unguium: Secondary | ICD-10-CM | POA: Insufficient documentation

## 2020-05-31 NOTE — Progress Notes (Signed)
Subjective:   Patient ID: Molly Hill, female   DOB: 69 y.o.   MRN: [id number]   HPI 69 year old female presents the office today for concerns of toenail fungus.  Mostly present on both of her big toes which is been ongoing getting worse over the last 3 to 4 months.  She is concerned that the fungus because she is a type I diabetic and her last A1c was 7.  Denies any open sores or any history of ulceration.  She has no other concerns today.   Review of Systems  All other systems reviewed and are negative.  Past Medical History:  Diagnosis Date  . Alopecia   . Anemia   . Bicuspid aortic valve    mild to moderate AR and moderate AS by echo 2021  . Carotid stenosis    1-39% stenosis bilaterally by dopplers 07/2019  . Convalescence after chemotherapy   . Coronary arteriosclerosis in native artery 2013   s/p PCI of LAD  . DM (diabetes mellitus), type 1 ([place])   . Estrogen receptor negative   . History of breast cancer   . Hyperglycemia   . Hyperlipidemia   . Hypertension   . Mild dehydration   . Mitral regurgitation    mild to moderate by echo 2020  . Murmur, heart   . Swelling of joint of both wrists   . Tendinitis of left wrist     Past Surgical History:  Procedure Laterality Date  . ANKLE SURGERY Bilateral   . CATARACT EXTRACTION, BILATERAL    . CHOLECYSTECTOMY    . CORONARY STENT INTERVENTION    . CTR    . ELBOW SURGERY    . HAND SURGERY     BOTH THUMBS AND MIDDLE FINGERS  . LEFT BREAST MASTECTOMY    . MASCULAR  REPAIR    . PORT-A-CATH REMOVAL    . PORTACATH PLACEMENT    . RIGHT MODIFIED MASTECTOMY Right   . UPPER LIP SURGERY       Current Outpatient Medications:  .  amLODipine (NORVASC) 2.5 MG tablet, Take 1 tablet (2.5 mg total) by mouth daily., Disp: 90 tablet, Rfl: 3 .  aspirin EC 81 MG tablet, Take 81 mg by mouth daily., Disp: , Rfl:  .  atorvastatin (LIPITOR) 40 MG tablet, Take 40 mg by mouth daily., Disp: , Rfl:  .  Biotin w/ Vitamins C & E  (HAIR/SKIN/NAILS PO), Take by mouth., Disp: , Rfl:  .  Calcium Carb-Cholecalciferol (CALCIUM 600 + D PO), Take by mouth., Disp: , Rfl:  .  celecoxib (CELEBREX) 200 MG capsule, Take 200 mg by mouth 2 (two) times daily., Disp: , Rfl:  .  docusate sodium (COLACE) 100 MG capsule, Take 100 mg by mouth daily as needed for mild constipation., Disp: , Rfl:  .  Efinaconazole 10 % SOLN, Apply 1 drop topically daily., Disp: 4 mL, Rfl: 11 .  fexofenadine (ALLEGRA) 180 MG tablet, Take 180 mg by mouth as needed. , Disp: , Rfl:  .  fluticasone (FLONASE) 50 MCG/ACT nasal spray, Place into both nostrils as needed. , Disp: , Rfl:  .  glucose blood test strip, 1 each by Other route as needed for other. Use as instructed, Disp: , Rfl:  .  insulin NPH Human (NOVOLIN N) 100 UNIT/ML injection, Inject into the skin., Disp: , Rfl:  .  insulin regular (NOVOLIN R) 100 units/mL injection, Inject 100 Units into the skin 3 (three) times daily before meals., Disp: , Rfl:  .  loperamide (IMODIUM) 1 MG/5ML solution, Take by mouth as needed for diarrhea or loose stools., Disp: , Rfl:  .  Metoprolol Succinate 25 MG CS24, Take 25 mg by mouth., Disp: , Rfl:  .  nitroGLYCERIN (NITROSTAT) 0.4 MG SL tablet, Place 0.4 mg under the tongue every 5 (five) minutes as needed for chest pain., Disp: , Rfl:  .  ramipril (ALTACE) 10 MG capsule, Take 10 mg by mouth daily., Disp: , Rfl:  .  traZODone (DESYREL) 50 MG tablet, Take 50 mg by mouth 3 (three) times daily. (Patient not taking: Reported on 04/08/2020), Disp: , Rfl:   Allergies  Allergen Reactions  . Codeine     Hyper with vomiting  . Reglan [Metoclopramide]     "pulls muscle to side" "feel paralyzed"         Objective:  Physical Exam  General: AAO x3, NAD  Dermatological: Bilateral hallux toenails are mildly hypertrophic, dystrophic with yellow-brown discoloration most of the distal one half of the nails.  There is no edema, erythema or clinical signs of infection noted to the  toenail sites.  No open lesions are identified today.  Vascular: Dorsalis Pedis artery and Posterior Tibial artery pedal pulses are 2/4 bilateral with immedate capillary fill time. There is no pain with calf compression, swelling, warmth, erythema.   Neruologic: Grossly intact via light touch bilateral.   Musculoskeletal: No gross boney pedal deformities bilateral. No pain, crepitus, or limitation noted with foot and ankle range of motion bilateral. Muscular strength 5/5 in all groups tested bilateral.  Gait: Unassisted, Nonantalgic.       Assessment:   69 year old female with onychomycosis    Plan:  -Treatment options discussed including all alternatives, risks, and complications -Etiology of symptoms were discussed -Prescribed Jublia.  Discussed side effects as well as duration of use.  Trula Slade DPM

## 2020-05-31 NOTE — Telephone Encounter (Signed)
Pharmacy called stating that pts insurance will not cover this medication efinanazole

## 2020-06-01 NOTE — Telephone Encounter (Signed)
Molly Hill has sent the compound medication to [facility] and I have sent a message to her via [place]. Thanks.

## 2020-06-07 ENCOUNTER — Other Ambulatory Visit: Payer: Self-pay

## 2020-06-07 ENCOUNTER — Ambulatory Visit
Admission: RE | Admit: 2020-06-07 | Discharge: 2020-06-07 | Disposition: A | Discharge status: Home / Self Care | Payer: Medicare HMO | Source: Ambulatory Visit | Attending: Physician Assistant | Admitting: Physician Assistant

## 2020-06-07 DIAGNOSIS — M810 Age-related osteoporosis without current pathological fracture: Secondary | ICD-10-CM | POA: Diagnosis not present

## 2020-06-07 DIAGNOSIS — Z780 Asymptomatic menopausal state: Secondary | ICD-10-CM | POA: Diagnosis not present

## 2020-06-07 DIAGNOSIS — M8588 Other specified disorders of bone density and structure, other site: Secondary | ICD-10-CM | POA: Diagnosis not present

## 2020-06-07 DIAGNOSIS — M8589 Other specified disorders of bone density and structure, multiple sites: Secondary | ICD-10-CM

## 2020-06-08 ENCOUNTER — Ambulatory Visit
Admission: RE | Admit: 2020-06-08 | Discharge: 2020-06-08 | Disposition: A | Discharge status: Home / Self Care | Payer: Medicare HMO | Source: Ambulatory Visit | Attending: Ophthalmology | Admitting: Ophthalmology

## 2020-06-08 DIAGNOSIS — J320 Chronic maxillary sinusitis: Secondary | ICD-10-CM | POA: Diagnosis not present

## 2020-06-08 DIAGNOSIS — H4913 Fourth [trochlear] nerve palsy, bilateral: Secondary | ICD-10-CM

## 2020-06-08 DIAGNOSIS — H4910 Fourth [trochlear] nerve palsy, unspecified eye: Secondary | ICD-10-CM | POA: Diagnosis not present

## 2020-06-08 DIAGNOSIS — G9389 Other specified disorders of brain: Secondary | ICD-10-CM | POA: Diagnosis not present

## 2020-06-08 DIAGNOSIS — J3489 Other specified disorders of nose and nasal sinuses: Secondary | ICD-10-CM | POA: Diagnosis not present

## 2020-06-08 MED ORDER — GADOBENATE DIMEGLUMINE 529 MG/ML IV SOLN
13.0000 mL | Freq: Once | INTRAVENOUS | Status: AC | PRN
  Administered 2020-06-08: 13 mL via INTRAVENOUS

## 2020-06-11 ENCOUNTER — Other Ambulatory Visit: Payer: Self-pay | Admitting: Cardiology

## 2020-06-14 DIAGNOSIS — H4912 Fourth [trochlear] nerve palsy, left eye: Secondary | ICD-10-CM | POA: Diagnosis not present

## 2020-06-16 DIAGNOSIS — Z6829 Body mass index (BMI) 29.0-29.9, adult: Secondary | ICD-10-CM | POA: Diagnosis not present

## 2020-06-16 DIAGNOSIS — Z79899 Other long term (current) drug therapy: Secondary | ICD-10-CM | POA: Diagnosis not present

## 2020-06-16 DIAGNOSIS — M1990 Unspecified osteoarthritis, unspecified site: Secondary | ICD-10-CM | POA: Diagnosis not present

## 2020-06-16 DIAGNOSIS — M810 Age-related osteoporosis without current pathological fracture: Secondary | ICD-10-CM | POA: Diagnosis not present

## 2020-06-16 DIAGNOSIS — M8589 Other specified disorders of bone density and structure, multiple sites: Secondary | ICD-10-CM | POA: Diagnosis not present

## 2020-06-16 DIAGNOSIS — E663 Overweight: Secondary | ICD-10-CM | POA: Diagnosis not present

## 2020-07-05 NOTE — Progress Notes (Signed)
Date:  07/06/2020   ID:  Molly Hill, DOB 20-Aug-1951, MRN [id number]   PCP:  Ernestene Kiel, MD  Cardiologist:  Fransico Him, MD  Electrophysiologist:  None   Chief Complaint:  CAD, HTN, HLD, AS, MR  History of Present Illness:    This is a pleasant 69yo female with a hx of HTN, HLD, ASCAD s/p remote PCI of LAD in 2013 and heart murmur.  2D echo showed normal LVF with EF 60-65% with G2DD, mild LAE, degenerative thickening of the MV with mild to moderate MR and bicuspid AV with mild AR and moderate AS.    She is here today for followup and is doing well.  She denies any chest pain or pressure, SOB, DOE, PND, orthopnea, LE edema, dizziness, palpitations or syncope. She has had some throbbing pain in her left arm at night that is worse with certain movements and moving her arm in a certain position makes it go away. he is compliant with her meds and is tolerating meds with no SE.    The patient does not have symptoms concerning for COVID-19 infection (fever, chills, cough, or new shortness of breath).    Prior CV studies:   The following studies were reviewed today:  none  Past Medical History:  Diagnosis Date  . Alopecia   . Anemia   . Bicuspid aortic valve    mild to moderate AR and moderate AS by echo 2021  . Carotid stenosis    1-39% stenosis bilaterally by dopplers 07/2019  . Convalescence after chemotherapy   . Coronary arteriosclerosis in native artery 2013   s/p PCI of LAD  . DM (diabetes mellitus), type 1 ([place])   . Estrogen receptor negative   . History of breast cancer   . Hyperglycemia   . Hyperlipidemia   . Hypertension   . Mild dehydration   . Mitral regurgitation    mild to moderate by echo 2020  . Murmur, heart   . Swelling of joint of both wrists   . Tendinitis of left wrist    Past Surgical History:  Procedure Laterality Date  . ANKLE SURGERY Bilateral   . CATARACT EXTRACTION, BILATERAL    . CHOLECYSTECTOMY    . CORONARY STENT INTERVENTION     . CTR    . ELBOW SURGERY    . HAND SURGERY     BOTH THUMBS AND MIDDLE FINGERS  . LEFT BREAST MASTECTOMY    . MASCULAR  REPAIR    . PORT-A-CATH REMOVAL    . PORTACATH PLACEMENT    . RIGHT MODIFIED MASTECTOMY Right   . UPPER LIP SURGERY       Current Meds  Medication Sig  . amLODipine (NORVASC) 2.5 MG tablet Take 1 tablet (2.5 mg total) by mouth daily.  Marland Kitchen aspirin EC 81 MG tablet Take 81 mg by mouth daily.  Marland Kitchen atorvastatin (LIPITOR) 40 MG tablet Take 40 mg by mouth daily.  . Biotin w/ Vitamins C & E (HAIR/SKIN/NAILS PO) Take by mouth.  . Calcium Carb-Cholecalciferol (CALCIUM 600 + D PO) Take by mouth.  . celecoxib (CELEBREX) 200 MG capsule Take 200 mg by mouth 2 (two) times daily.  Marland Kitchen docusate sodium (COLACE) 100 MG capsule Take 100 mg by mouth daily as needed for mild constipation.  . Efinaconazole 10 % SOLN Apply 1 drop topically daily.  . fexofenadine (ALLEGRA) 180 MG tablet Take 180 mg by mouth as needed.   . fluticasone (FLONASE) 50 MCG/ACT nasal spray Place  into both nostrils as needed.   Marland Kitchen glucose blood test strip 1 each by Other route as needed for other. Use as instructed  . insulin NPH Human (NOVOLIN N) 100 UNIT/ML injection Inject into the skin.  Marland Kitchen insulin regular (NOVOLIN R) 100 units/mL injection Inject 100 Units into the skin 3 (three) times daily before meals.  Marland Kitchen loperamide (IMODIUM) 1 MG/5ML solution Take by mouth as needed for diarrhea or loose stools.  . Metoprolol Succinate 25 MG CS24 Take 25 mg by mouth.  . nitroGLYCERIN (NITROSTAT) 0.4 MG SL tablet Place 0.4 mg under the tongue every 5 (five) minutes as needed for chest pain.  . NON FORMULARY [facility]  Antifungal (nail)-#1/no urea 10%  . ramipril (ALTACE) 10 MG capsule Take 10 mg by mouth daily.     Allergies:   Codeine and Reglan [metoclopramide]   Social History   Tobacco Use  . Smoking status: Former Research scientist (life sciences)  . Smokeless tobacco: Never Used  Substance Use Topics  . Alcohol use: Never  .  Drug use: Never     Family Hx: The patient's family history includes Breast cancer in her maternal aunt; CAD in her father; Colon cancer in her father; Diabetes in her brother, father, and paternal grandfather; Heart attack in her mother and paternal grandfather; Stroke in her father.  ROS:   Please see the history of present illness.     All other systems reviewed and are negative.   Labs/Other Tests and Data Reviewed:    Recent Labs: 12/24/2019: TSH 1.808 05/24/2020: ALT 27; BUN 29; Creatinine, Ser 1.12; Hemoglobin 11.8; Platelets 208; Potassium 4.5; Sodium 138   Recent Lipid Panel Lab Results  Component Value Date/Time   CHOL 127 05/03/2020 11:16 AM   TRIG 102 05/03/2020 11:16 AM   HDL 60 05/03/2020 11:16 AM   CHOLHDL 2.1 05/03/2020 11:16 AM   LDLCALC 48 05/03/2020 11:16 AM    Wt Readings from Last 3 Encounters:  07/06/20 151 lb 6.4 oz (68.7 kg)  05/24/20 146 lb (66.2 kg)  05/03/20 137 lb (62.1 kg)     Objective:    Vital Signs:  BP (!) 140/52   Pulse 66   Ht 5' (1.524 m)   Wt 151 lb 6.4 oz (68.7 kg)   SpO2 98%   BMI 29.57 kg/m   GEN: Well nourished, well developed in no acute distress HEENT: Normal NECK: No JVD; bilateral carotid artery bruits LYMPHATICS: No lymphadenopathy CARDIAC:RRR, no murmurs, rubs, gallops RESPIRATORY:  Clear to auscultation without rales, wheezing or rhonchi  ABDOMEN: Soft, non-tender, non-distended MUSCULOSKELETAL:  No edema; No deformity  SKIN: Warm and dry NEUROLOGIC:  Alert and oriented x 3 PSYCHIATRIC:  Normal affect    ASSESSMENT & PLAN:    1.  Mitral regurgitation -2D echo 04/2020 showed mild to moderate MR -repeat echo to make sure this is stable 04/2021  2.  Bicuspid AV -echo 2021 showed moderate AS and mild AR -chest CTA 07/2019 showed no aortic aneurysm with aortic atherosclerosis -follow with yearly echo to assess for stability of AR and AS  3.  ASCAD -s/p PCI of LAD in 2013 -she has not any anginal  symptoms -no ischemia on nuclear stress test in July 2021 -continue ASA 81mg  daily, statin, BB  4.  Hyperlipidemia -LDL goal is < 70 -continue atorvastatin 40mg  daily -LDL was 48 in July  5.  HTN -BP controlled on exam -Continue Toprol XL 25mg  daily and Altace 10mg  dailyand amlodipine 2.5mg  daily -SCr was 1.12  in August 2021  6. Carotid artery stenosis -carotid dopplers 07/2019 showed 1-39% bilateral stenosis -repeat dopplers along with left subclavian artery due to pain in left arm at night -continue ASA and statin  7.  Left arm pain -she gets a throbbing pain in her left arm some at night but none during the day and no exertional complaint. -it is worse with certain movements of her arm in bed and if she puts her arm down at her side it goes away -I suspect this is MSK pain and does not sound like angina -I will get a LUE arterial doppler to rule out subclavian artery stenosis given her carotid disease as well  Medication Adjustments/Labs and Tests Ordered: Current medicines are reviewed at length with the patient today.  Concerns regarding medicines are outlined above.  Tests Ordered: No orders of the defined types were placed in this encounter.  Medication Changes: No orders of the defined types were placed in this encounter.   Disposition:  Follow up 6 months  Signed, Fransico Him, MD  07/06/2020 10:39 AM    [place]

## 2020-07-06 ENCOUNTER — Ambulatory Visit: Payer: Medicare HMO | Admitting: Cardiology

## 2020-07-06 ENCOUNTER — Other Ambulatory Visit: Payer: Self-pay

## 2020-07-06 ENCOUNTER — Other Ambulatory Visit: Payer: Self-pay | Admitting: Cardiology

## 2020-07-06 ENCOUNTER — Encounter: Payer: Self-pay | Admitting: Cardiology

## 2020-07-06 VITALS — BP 140/52 | HR 66 | Ht 60.0 in | Wt 151.4 lb

## 2020-07-06 DIAGNOSIS — I340 Nonrheumatic mitral (valve) insufficiency: Secondary | ICD-10-CM

## 2020-07-06 DIAGNOSIS — I2510 Atherosclerotic heart disease of native coronary artery without angina pectoris: Secondary | ICD-10-CM | POA: Diagnosis not present

## 2020-07-06 DIAGNOSIS — I6523 Occlusion and stenosis of bilateral carotid arteries: Secondary | ICD-10-CM | POA: Diagnosis not present

## 2020-07-06 DIAGNOSIS — Q231 Congenital insufficiency of aortic valve: Secondary | ICD-10-CM

## 2020-07-06 DIAGNOSIS — E7800 Pure hypercholesterolemia, unspecified: Secondary | ICD-10-CM

## 2020-07-06 NOTE — Patient Instructions (Signed)
Medication Instructions:  Your physician recommends that you continue on your current medications as directed. Please refer to the Current Medication list given to you today.  *If you need a refill on your cardiac medications before your next appointment, please call your pharmacy*  Testing/Procedures: Your physician has requested that you have an upper extremity arterial duplex. This test is an ultrasound of the arteries in thearms. It looks at arterial blood flow in the arms. Allow one hour for Upper Arterial scans. There are no restrictions or special instructions   Follow-Up: At [facility], you and your health needs are our priority.  As part of our continuing mission to provide you with exceptional heart care, we have created designated Provider Care Teams.  These Care Teams include your primary Cardiologist (physician) and Advanced Practice Providers (APPs -  Physician Assistants and Nurse Practitioners) who all work together to provide you with the care you need, when you need it.  Your next appointment:   1 year(s)  The format for your next appointment:   In Person  Provider:   You may see Fransico Him, MD or one of the following Advanced Practice Providers on your designated Care Team:    Melina Copa, PA-C  Ermalinda Barrios, PA-C

## 2020-07-06 NOTE — Addendum Note (Signed)
Addended by: Antonieta Iba on: 07/06/2020 10:52 AM   Modules accepted: Orders

## 2020-07-12 ENCOUNTER — Other Ambulatory Visit: Payer: Self-pay | Admitting: Cardiology

## 2020-07-12 DIAGNOSIS — M79601 Pain in right arm: Secondary | ICD-10-CM

## 2020-07-12 DIAGNOSIS — I6523 Occlusion and stenosis of bilateral carotid arteries: Secondary | ICD-10-CM

## 2020-07-20 ENCOUNTER — Other Ambulatory Visit (HOSPITAL_COMMUNITY): Payer: Medicare HMO

## 2020-07-21 DIAGNOSIS — M810 Age-related osteoporosis without current pathological fracture: Secondary | ICD-10-CM | POA: Diagnosis not present

## 2020-07-24 DIAGNOSIS — Z23 Encounter for immunization: Secondary | ICD-10-CM | POA: Diagnosis not present

## 2020-07-26 ENCOUNTER — Ambulatory Visit (HOSPITAL_COMMUNITY)
Admission: RE | Admit: 2020-07-26 | Discharge: 2020-07-26 | Disposition: A | Discharge status: Home / Self Care | Payer: Medicare HMO | Source: Ambulatory Visit | Attending: Cardiology | Admitting: Cardiology

## 2020-07-26 ENCOUNTER — Ambulatory Visit (HOSPITAL_BASED_OUTPATIENT_CLINIC_OR_DEPARTMENT_OTHER)
Admission: RE | Admit: 2020-07-26 | Discharge: 2020-07-26 | Disposition: A | Discharge status: Home / Self Care | Payer: Medicare HMO | Source: Ambulatory Visit | Attending: Cardiology | Admitting: Cardiology

## 2020-07-26 DIAGNOSIS — I6523 Occlusion and stenosis of bilateral carotid arteries: Secondary | ICD-10-CM | POA: Insufficient documentation

## 2020-07-26 DIAGNOSIS — M79601 Pain in right arm: Secondary | ICD-10-CM

## 2020-07-27 ENCOUNTER — Encounter: Payer: Self-pay | Admitting: Podiatry

## 2020-07-27 ENCOUNTER — Ambulatory Visit (INDEPENDENT_AMBULATORY_CARE_PROVIDER_SITE_OTHER): Payer: Medicare HMO

## 2020-07-27 ENCOUNTER — Other Ambulatory Visit: Payer: Self-pay

## 2020-07-27 ENCOUNTER — Ambulatory Visit: Payer: Medicare HMO | Admitting: Podiatry

## 2020-07-27 DIAGNOSIS — M7672 Peroneal tendinitis, left leg: Secondary | ICD-10-CM | POA: Diagnosis not present

## 2020-07-27 DIAGNOSIS — M7750 Other enthesopathy of unspecified foot: Secondary | ICD-10-CM

## 2020-07-27 DIAGNOSIS — M7752 Other enthesopathy of left foot: Secondary | ICD-10-CM

## 2020-07-27 DIAGNOSIS — G5782 Other specified mononeuropathies of left lower limb: Secondary | ICD-10-CM

## 2020-07-27 NOTE — Patient Instructions (Signed)
Look for Voltaren gel at the pharmacy over the counter or online (also known as diclofenac 1% gel). Apply to the painful areas 3-4x daily with the supplied dosing card. Allow to dry for 10 minutes before going into socks/shoes   Peroneal Tendinopathy Rehab Ask your health care provider which exercises are safe for you. Do exercises exactly as told by your health care provider and adjust them as directed. It is normal to feel mild stretching, pulling, tightness, or discomfort as you do these exercises. Stop right away if you feel sudden pain or your pain gets worse. Do not begin these exercises until told by your health care provider. Stretching and range-of-motion exercises These exercises warm up your muscles and joints and improve the movement and flexibility of your ankle. These exercises also help to relieve pain and stiffness. Gastroc and soleus stretch, standing  This is an exercise in which you stand on a step and use your body weight to stretch your calf muscles. To do this exercise: 1. Stand on the edge of a step on the ball of your left / right foot. The ball of your foot is on the walking surface, right under your toes. 2. Keep your other foot firmly on the same step. 3. Hold on to the wall, a railing, or a chair for balance. 4. Slowly lift your other foot, allowing your body weight to press your left / right heel down over the edge of the step. You should feel a stretch in your left / right calf (gastrocnemius and soleus). 5. Hold this position for 15 seconds. 6. Return both feet to the step. 7. Repeat this exercise with a slight bend in your left / right knee. Repeat 5 times with your left / right knee straight and 5 times with your left / right knee bent. Complete this exercise 2 times a day. Strengthening exercises These exercises build strength and endurance in your foot and ankle. Endurance is the ability to use your muscles for a long time, even after they get tired. Ankle  dorsiflexion with band   1. Secure a rubber exercise band or tube to an object, such as a table leg, that will not move when the band is pulled. 2. Secure the other end of the band around your left / right foot. 3. Sit on the floor, facing the object with your left / right leg extended. The band or tube should be slightly tense when your foot is relaxed. 4. Slowly flex your left / right ankle and toes to bring your foot toward you (dorsiflexion). 5. Hold this position for 15 seconds. 6. Let the band or tube slowly pull your foot back to the starting position. Repeat 5 times. Complete this exercise 2 times a day. Ankle eversion 1. Sit on the floor with your legs straight out in front of you. 2. Loop a rubber exercise band or tube around the ball of your left / right foot. The ball of your foot is on the walking surface, right under your toes. 3. Hold the ends of the band in your hands, or secure the band to a stable object. The band or tube should be slightly tense when your foot is relaxed. 4. Slowly push your foot outward, away from your other leg (eversion). 5. Hold this position for 15 seconds. 6. Slowly return your foot to the starting position. Repeat 5 times. Complete this exercise 2 times a day. Plantar flexion, standing  This exercise is sometimes called standing heel   raise. 1. Stand with your feet shoulder-width apart. 2. Place your hands on a wall or table to steady yourself as needed, but try not to use it for support. 3. Keep your weight spread evenly over the width of your feet while you slowly rise up on your toes (plantar flexion). If told by your health care provider: ? Shift your weight toward your left / right leg until you feel challenged. ? Stand on your left / right leg only. 4. Hold this position for 15 seconds. Repeat 2 times. Complete this exercise 2 times a day. Single leg stand 1. Without shoes, stand near a railing or in a doorway. You may hold on to the  railing or door frame as needed. 2. Stand on your left / right foot. Keep your big toe down on the floor and try to keep your arch lifted. ? Do not roll to the outside of your foot. ? If this exercise is too easy, you can try it with your eyes closed or while standing on a pillow. 3. Hold this position for 15 seconds. Repeat 5 times. Complete this exercise 2 times a day. This information is not intended to replace advice given to you by your health care provider. Make sure you discuss any questions you have with your health care provider. Document Revised: 01/21/2019 Document Reviewed: 01/21/2019 Elsevier Patient Education  2020 Elsevier Inc.  

## 2020-07-27 NOTE — Progress Notes (Signed)
  Subjective:  Patient ID: Molly Hill, female    DOB: 04-May-1951,  MRN: [id number]  Chief Complaint  Patient presents with  . Foot Pain    left ankle pain x 1 week    69 y.o. female presents with the above complaint. History confirmed with patient.  First noticed that after she was bowling last week and started hurting afterwards on the outside of the left ankle.  She has been able to put weight on it.  She has a history of fracture of this ankle several years ago treated with ORIF.  Objective:  Physical Exam: warm, good capillary refill, no trophic changes or ulcerative lesions, normal DP and PT pulses and normal sensory exam. Left Foot: Pain on palpation over the peroneal tendons which is mild with resisted eversion.  She also has pain more posterior to this with percussion of the sural nerve which radiates down the side of the foot and ankle.  Plates in the lateral fibula are palpable.  No pain over the hardware.  Radiographs: X-ray of the left foot: Hardware intact without prosthetic complication ankle joint space is maintained with minimal arthritic changes Assessment:   1. Peroneal tendinitis of left lower extremity   2. Neuritis of left sural nerve   3. Tendonitis of ankle or foot      Plan:  Patient was evaluated and treated and all questions answered.  -Discussed with her that she does not appear to have complication from her previous ankle fracture and hardware.  She is currently not painful over the hardware itself and does not appear to have neuritic symptoms arising from this.  It is possible that this has contributed to a level of peroneal tendinitis however the lateral position of the plate is unlikely to have caused this  -Tri-Lock ankle brace was dispensed for peroneal tendinitis.  I also recommend that she use Voltaren gel.  She is able to take NSAIDs but only as needed and with meals so it does not bother her stomach.  -Unclear if this is solely peroneal  tendinitis versus a component of sural tendinitis.  I recommended a diagnostic injection along the peroneal tendon sheath as well as along the sural nerve.  0.25 cc of 2% Xylocaine and 2 mg of dexamethasone phosphate was injected along the sural nerve and the same volume was injected along the peroneal tendon sheath and the most painful area in the retromalleolar area.  -Recommended she rest and continue use the ankle brace for support immobilization following the injection.  -Peroneal tendinopathy stretching signs reviewed and given to her and she will begin these in 1 to 2 weeks if she has pain relief from this.  Return in about 1 month (around 08/27/2020) for re-check peroneal tendinitis.

## 2020-07-28 ENCOUNTER — Encounter: Payer: Self-pay | Admitting: Cardiology

## 2020-08-02 ENCOUNTER — Telehealth: Payer: Self-pay | Admitting: Cardiology

## 2020-08-02 NOTE — Telephone Encounter (Signed)
Patient is returning call to discuss carotid results. 

## 2020-08-02 NOTE — Telephone Encounter (Signed)
The patient has been notified of the result and verbalized understanding.  All questions (if any) were answered. Antonieta Iba, RN 08/02/2020 5:04 PM

## 2020-08-24 ENCOUNTER — Ambulatory Visit: Payer: Medicare HMO | Admitting: Podiatry

## 2020-08-24 ENCOUNTER — Other Ambulatory Visit: Payer: Self-pay

## 2020-08-24 DIAGNOSIS — B351 Tinea unguium: Secondary | ICD-10-CM

## 2020-08-24 NOTE — Progress Notes (Signed)
Subjective: 69 year old female presents the office today for follow-up evaluation of toenail fungus.  She is still on Jublia has been doing well with his medication.  Nails have been growing out.  She also has not had any pain, redness to the nails. Denies any systemic complaints such as fevers, chills, nausea, vomiting. No acute changes since last appointment, and no other complaints at this time.   Objective: AAO x3, NAD DP/PT pulses palpable bilaterally, CRT less than 3 seconds Nails appear to grow nails are mildly dystrophic with yellow discoloration there is no pain in the nail there is no redness or drainage or signs of infection.  No pain with calf compression, swelling, warmth, erythema  Assessment: 69 year old female with onychomycosis with improvement  Plan: -All treatment options discussed with the patient including all alternatives, risks, complications.  -Overall doing well.  She will continue with Jublia. -Patient encouraged to call the office with any questions, concerns, change in symptoms.   Trula Slade DPM

## 2020-08-27 ENCOUNTER — Other Ambulatory Visit: Payer: Self-pay

## 2020-08-27 ENCOUNTER — Ambulatory Visit: Payer: Medicare HMO | Admitting: Podiatry

## 2020-08-27 DIAGNOSIS — M7672 Peroneal tendinitis, left leg: Secondary | ICD-10-CM

## 2020-08-30 ENCOUNTER — Encounter: Payer: Self-pay | Admitting: Podiatry

## 2020-08-30 NOTE — Progress Notes (Signed)
  Subjective:  Patient ID: Molly Hill, female    DOB: [birth year],  MRN: [id number]  Chief Complaint  Patient presents with  . Tendonitis    Pt states left tendinitis is doing better.  . Ankle Pain    Right lateral ankle some discomfort/swelling. Pt believes due to hardware.    69 y.o. female returns with the above complaint. History confirmed with patient.  Has been increasing her activity.  She is even back to bowling.  Objective:  Physical Exam: warm, good capillary refill, no trophic changes or ulcerative lesions, normal DP and PT pulses and normal sensory exam. Left Foot: Minimal pain along the peroneal tendons today, her strength is 5 out of 5 with resisted eversion without pain.  Plates in the lateral fibula are palpable.  No pain over the hardware. Right ankle mild edema over lateral ankle hardware, minimal pain   Radiographs: X-ray of the left foot: Hardware intact without prosthetic complication ankle joint space is maintained with minimal arthritic changes Assessment:   No diagnosis found.   Plan:  Patient was evaluated and treated and all questions answered.  -Overall she is improved significantly.  I advised her to continue icing and stretching and anti-inflammatories as needed.  Slowly increase her activities according to the 30-minute rule protocol which I reviewed with her.  The right ankle is only minimally bothersome.  Bruising of discussed that removal of hardware could improve some of these issues for her but she would prefer not to do this and I think that is reasonable as it only minimally bothersome.  Return if symptoms worsen or fail to improve.

## 2020-11-05 ENCOUNTER — Ambulatory Visit (HOSPITAL_COMMUNITY): Payer: Medicare HMO | Attending: Cardiovascular Disease

## 2020-11-05 ENCOUNTER — Other Ambulatory Visit (HOSPITAL_COMMUNITY): Payer: Medicare HMO

## 2020-11-05 ENCOUNTER — Other Ambulatory Visit: Payer: Self-pay

## 2020-11-05 ENCOUNTER — Encounter: Payer: Self-pay | Admitting: Cardiology

## 2020-11-05 DIAGNOSIS — I350 Nonrheumatic aortic (valve) stenosis: Secondary | ICD-10-CM | POA: Diagnosis present

## 2020-11-05 DIAGNOSIS — I340 Nonrheumatic mitral (valve) insufficiency: Secondary | ICD-10-CM | POA: Diagnosis present

## 2020-11-05 DIAGNOSIS — Q231 Congenital insufficiency of aortic valve: Secondary | ICD-10-CM | POA: Diagnosis present

## 2020-11-05 LAB — ECHOCARDIOGRAM COMPLETE
AR max vel: 0.79 cm2
AV Area VTI: 0.86 cm2
AV Area mean vel: 0.82 cm2
AV Mean grad: 19 mmHg
AV Peak grad: 35.4 mmHg
Ao pk vel: 2.98 m/s
Area-P 1/2: 3.39 cm2
MV M vel: 5.79 m/s
MV Peak grad: 134.1 mmHg
S' Lateral: 2.1 cm

## 2020-11-24 ENCOUNTER — Inpatient Hospital Stay: Payer: Medicare HMO | Attending: Internal Medicine

## 2020-11-24 ENCOUNTER — Other Ambulatory Visit: Payer: Self-pay

## 2020-11-24 ENCOUNTER — Encounter: Payer: Self-pay | Admitting: Internal Medicine

## 2020-11-24 ENCOUNTER — Inpatient Hospital Stay (HOSPITAL_BASED_OUTPATIENT_CLINIC_OR_DEPARTMENT_OTHER): Payer: Medicare HMO | Admitting: Internal Medicine

## 2020-11-24 VITALS — BP 163/65 | HR 67 | Temp 97.7°F | Resp 20 | Wt 162.1 lb

## 2020-11-24 DIAGNOSIS — R531 Weakness: Secondary | ICD-10-CM | POA: Insufficient documentation

## 2020-11-24 DIAGNOSIS — E785 Hyperlipidemia, unspecified: Secondary | ICD-10-CM | POA: Diagnosis not present

## 2020-11-24 DIAGNOSIS — I10 Essential (primary) hypertension: Secondary | ICD-10-CM | POA: Diagnosis not present

## 2020-11-24 DIAGNOSIS — Z800 Family history of malignant neoplasm of digestive organs: Secondary | ICD-10-CM | POA: Diagnosis not present

## 2020-11-24 DIAGNOSIS — R5383 Other fatigue: Secondary | ICD-10-CM

## 2020-11-24 DIAGNOSIS — Z171 Estrogen receptor negative status [ER-]: Secondary | ICD-10-CM

## 2020-11-24 DIAGNOSIS — E119 Type 2 diabetes mellitus without complications: Secondary | ICD-10-CM | POA: Diagnosis not present

## 2020-11-24 DIAGNOSIS — Z79899 Other long term (current) drug therapy: Secondary | ICD-10-CM | POA: Diagnosis not present

## 2020-11-24 DIAGNOSIS — R011 Cardiac murmur, unspecified: Secondary | ICD-10-CM | POA: Diagnosis not present

## 2020-11-24 DIAGNOSIS — C50811 Malignant neoplasm of overlapping sites of right female breast: Secondary | ICD-10-CM

## 2020-11-24 DIAGNOSIS — E860 Dehydration: Secondary | ICD-10-CM | POA: Diagnosis not present

## 2020-11-24 DIAGNOSIS — I6529 Occlusion and stenosis of unspecified carotid artery: Secondary | ICD-10-CM | POA: Insufficient documentation

## 2020-11-24 DIAGNOSIS — Z87891 Personal history of nicotine dependence: Secondary | ICD-10-CM | POA: Insufficient documentation

## 2020-11-24 DIAGNOSIS — R140 Abdominal distension (gaseous): Secondary | ICD-10-CM | POA: Diagnosis not present

## 2020-11-24 LAB — CBC WITH DIFFERENTIAL/PLATELET
Abs Immature Granulocytes: 0.02 10*3/uL (ref 0.00–0.07)
Basophils Absolute: 0 10*3/uL (ref 0.0–0.1)
Basophils Relative: 0 %
Eosinophils Absolute: 0.2 10*3/uL (ref 0.0–0.5)
Eosinophils Relative: 2 %
HCT: 36.6 % (ref 36.0–46.0)
Hemoglobin: 12 g/dL (ref 12.0–15.0)
Immature Granulocytes: 0 %
Lymphocytes Relative: 25 %
Lymphs Abs: 1.8 10*3/uL (ref 0.7–4.0)
MCH: 30.6 pg (ref 26.0–34.0)
MCHC: 32.8 g/dL (ref 30.0–36.0)
MCV: 93.4 fL (ref 80.0–100.0)
Monocytes Absolute: 0.6 10*3/uL (ref 0.1–1.0)
Monocytes Relative: 9 %
Neutro Abs: 4.4 10*3/uL (ref 1.7–7.7)
Neutrophils Relative %: 64 %
Platelets: 224 10*3/uL (ref 150–400)
RBC: 3.92 MIL/uL (ref 3.87–5.11)
RDW: 12.6 % (ref 11.5–15.5)
WBC: 7 10*3/uL (ref 4.0–10.5)
nRBC: 0 % (ref 0.0–0.2)

## 2020-11-24 LAB — COMPREHENSIVE METABOLIC PANEL
ALT: 79 U/L — ABNORMAL HIGH (ref 0–44)
AST: 169 U/L — ABNORMAL HIGH (ref 15–41)
Albumin: 4.1 g/dL (ref 3.5–5.0)
Alkaline Phosphatase: 150 U/L — ABNORMAL HIGH (ref 38–126)
Anion gap: 9 (ref 5–15)
BUN: 27 mg/dL — ABNORMAL HIGH (ref 8–23)
CO2: 27 mmol/L (ref 22–32)
Calcium: 9.2 mg/dL (ref 8.9–10.3)
Chloride: 101 mmol/L (ref 98–111)
Creatinine, Ser: 1.22 mg/dL — ABNORMAL HIGH (ref 0.44–1.00)
GFR, Estimated: 48 mL/min — ABNORMAL LOW (ref >60)
Glucose, Bld: 143 mg/dL — ABNORMAL HIGH (ref 70–99)
Potassium: 4.4 mmol/L (ref 3.5–5.1)
Sodium: 137 mmol/L (ref 135–145)
Total Bilirubin: 0.8 mg/dL (ref 0.3–1.2)
Total Protein: 7.7 g/dL (ref 6.5–8.1)

## 2020-11-24 LAB — TSH: TSH: 3.484 u[IU]/mL (ref 0.350–4.500)

## 2020-11-24 NOTE — Assessment & Plan Note (Addendum)
Bilateral breast cancer triple negative [September 2016]- STABLE;.  No evidence of recurrence.  Continue surveillance.  #Gaining weight/abdominal distention-elevated LFTs; normal bilirubin/slight worsening renal function- GFR-48.  Etiology unclear.  No nephrotoxic/hepatotoxic drugs. Recommend US abdomen ASAP [[place]].  Check TSH.  #Anemia mild hemoglobin 12.2- STABLE.  *d [place]-rad # DISPOSITION: add TSH # US abdomen ASAP # follow up TBD-Dr.B

## 2020-11-24 NOTE — Progress Notes (Signed)
[facility] CONSULT NOTE  Patient Care Team: Ernestene Kiel, MD as PCP - General (General Practice) Sueanne Margarita, MD as PCP - Cardiology (Cardiology) Cammie Sickle, MD as Consulting Physician (Hematology and Oncology)  CHIEF COMPLAINTS/PURPOSE OF CONSULTATION: breast cancer  #  Oncology History Overview Note  #September 2016-multifocal right breast cancer triple negative [ER/PR HER-2 negative; [facility]]-PET-no metastatic disease; dose dense Adriamycin followed by weekly Taxol.  November 05, 2013-right mastectomy- [id number]-complete pathologic response.  status post adjuvant right ax.radiation Left breast cancer- as pt/reocds s/p mastectomy-no malignancy noted.    # DM- on insulin [since 6433]  # SURVIVORSHIP:   # GENETICS: NEG as per pt.   DIAGNOSIS: Right breast cancer  STAGE: early stage        ;  GOALS: cure  CURRENT/MOST RECENT THERAPY : Surveillance      Carcinoma of overlapping sites of right breast in female, estrogen receptor negative ([place])  12/24/2019 Initial Diagnosis   Carcinoma of overlapping sites of right breast in female, estrogen receptor negative ([place])      HISTORY OF PRESENTING ILLNESS:   Molly Hill 70 y.o.  female above history of bilateral triple negative breast cancer is here for follow-up.  Patient states her appetite is good.  Her significant weight even though her dietary preferences have not changed.  She continues to be physically active.  Notes to have abdominal distention.  No abdominal pain.  No constipation.  No urinary incontinence.  Denies any alcohol abuse.  Patient takes Tylenol as needed for joint pains.  Review of Systems  Constitutional: Positive for malaise/fatigue. Negative for chills, diaphoresis, fever and weight loss.  HENT: Negative for nosebleeds and sore throat.   Eyes: Negative for double vision.  Respiratory: Negative for cough, hemoptysis, sputum production, shortness  of breath and wheezing.   Cardiovascular: Negative for chest pain, palpitations, orthopnea and leg swelling.  Gastrointestinal: Negative for abdominal pain, blood in stool, constipation, diarrhea, heartburn, melena, nausea and vomiting.  Genitourinary: Negative for dysuria, frequency and urgency.  Musculoskeletal: Positive for joint pain. Negative for back pain.  Skin: Negative.  Negative for itching and Mase.  Neurological: Negative for dizziness, tingling, focal weakness, weakness and headaches.  Endo/Heme/Allergies: Does not bruise/bleed easily.  Psychiatric/Behavioral: Negative for depression. The patient is not nervous/anxious and does not have insomnia.      MEDICAL HISTORY:  Past Medical History:  Diagnosis Date  . Alopecia   . Anemia   . Bicuspid aortic valve    mild to moderate AS by echo 2022  . Carotid stenosis    1-39% bilateral carotid stenosis by dopplers 07/2020  . Convalescence after chemotherapy   . Coronary arteriosclerosis in native artery 2013   s/p PCI of LAD  . DM (diabetes mellitus), type 1 ([place])   . Estrogen receptor negative   . History of breast cancer   . Hyperglycemia   . Hyperlipidemia   . Hypertension   . Mild dehydration   . Mitral regurgitation    mild to moderate by echo 2020  . Murmur, heart   . Swelling of joint of both wrists   . Tendinitis of left wrist     SURGICAL HISTORY: Past Surgical History:  Procedure Laterality Date  . ANKLE SURGERY Bilateral   . CATARACT EXTRACTION, BILATERAL    . CHOLECYSTECTOMY    . CORONARY STENT INTERVENTION    . CTR    . ELBOW SURGERY    . HAND SURGERY  BOTH THUMBS AND MIDDLE FINGERS  . LEFT BREAST MASTECTOMY    . MASCULAR  REPAIR    . PORT-A-CATH REMOVAL    . PORTACATH PLACEMENT    . RIGHT MODIFIED MASTECTOMY Right   . UPPER LIP SURGERY      SOCIAL HISTORY: Social History   Socioeconomic History  . Marital status: Married    Spouse name: Not on file  . Number of children: Not on file   . Years of education: Not on file  . Highest education level: Not on file  Occupational History  . Not on file  Tobacco Use  . Smoking status: Former Research scientist (life sciences)  . Smokeless tobacco: Never Used  Substance and Sexual Activity  . Alcohol use: Never  . Drug use: Never  . Sexual activity: Never  Other Topics Concern  . Not on file  Social History Narrative  . Not on file   Social Determinants of Health   Financial Resource Strain: Not on file  Food Insecurity: Not on file  Transportation Needs: Not on file  Physical Activity: Not on file  Stress: Not on file  Social Connections: Not on file  Intimate Partner Violence: Not on file    FAMILY HISTORY: Family History  Problem Relation Age of Onset  . Heart attack Mother   . CAD Father        CABG  . Diabetes Father   . Stroke Father   . Colon cancer Father        61s  . Diabetes Brother   . Diabetes Paternal Grandfather   . Heart attack Paternal Grandfather   . Breast cancer Maternal Aunt        36s    ALLERGIES:  is allergic to codeine and reglan [metoclopramide].  MEDICATIONS:  Current Outpatient Medications  Medication Sig Dispense Refill  . amLODipine (NORVASC) 2.5 MG tablet Take 1 tablet (2.5 mg total) by mouth daily. 90 tablet 3  . aspirin EC 81 MG tablet Take 81 mg by mouth daily.    Marland Kitchen atorvastatin (LIPITOR) 40 MG tablet Take 40 mg by mouth daily.    . Biotin w/ Vitamins C & E (HAIR/SKIN/NAILS PO) Take by mouth.    . Calcium Carb-Cholecalciferol (CALCIUM 600 + D PO) Take by mouth.    . celecoxib (CELEBREX) 200 MG capsule Take 200 mg by mouth 2 (two) times daily.    Marland Kitchen docusate sodium (COLACE) 100 MG capsule Take 100 mg by mouth daily as needed for mild constipation.    . Efinaconazole 10 % SOLN Apply 1 drop topically daily. 4 mL 11  . fexofenadine (ALLEGRA) 180 MG tablet Take 180 mg by mouth as needed.     . fluticasone (FLONASE) 50 MCG/ACT nasal spray Place into both nostrils as needed.     Marland Kitchen glucose blood test  strip 1 each by Other route as needed for other. Use as instructed    . insulin NPH Human (NOVOLIN N) 100 UNIT/ML injection Inject into the skin.    Marland Kitchen insulin regular (NOVOLIN R) 100 units/mL injection Inject 100 Units into the skin 3 (three) times daily before meals.    . Metoprolol Succinate 25 MG CS24 Take 25 mg by mouth.    . NON FORMULARY [facility]  Antifungal (nail)-#1/no urea 10%    . ramipril (ALTACE) 10 MG capsule Take 10 mg by mouth daily.    Marland Kitchen loperamide (IMODIUM) 1 MG/5ML solution Take by mouth as needed for diarrhea or loose stools. (Patient not  taking: Reported on 11/24/2020)    . nitroGLYCERIN (NITROSTAT) 0.4 MG SL tablet Place 0.4 mg under the tongue every 5 (five) minutes as needed for chest pain. (Patient not taking: Reported on 11/24/2020)     No current facility-administered medications for this visit.      Marland Kitchen  PHYSICAL EXAMINATION: ECOG PERFORMANCE STATUS: 0 - Asymptomatic  Vitals:   11/24/20 1333  BP: (!) 163/65  Pulse: 67  Resp: 20  Temp: 97.7 F (36.5 C)  SpO2: 100%   Filed Weights   11/24/20 1333  Weight: 162 lb 2 oz (73.5 kg)    Physical Exam Constitutional:      Comments: Ambulating independently.  Alone.  HENT:     Head: Normocephalic and atraumatic.     Mouth/Throat:     Pharynx: No oropharyngeal exudate.  Eyes:     Pupils: Pupils are equal, round, and reactive to light.  Cardiovascular:     Rate and Rhythm: Normal rate and regular rhythm.  Pulmonary:     Effort: No respiratory distress.     Breath sounds: No wheezing.  Abdominal:     General: Bowel sounds are normal. There is no distension.     Palpations: Abdomen is soft. There is no mass.     Tenderness: There is no abdominal tenderness. There is no guarding or rebound.  Musculoskeletal:        General: No tenderness. Normal range of motion.     Cervical back: Normal range of motion and neck supple.  Skin:    General: Skin is warm.     Comments: Bilateral mastectomies  noted.  No lumps or bumps noted.  Positive chest wall telangiectasia.  Neurological:     Mental Status: She is alert and oriented to person, place, and time.  Psychiatric:        Mood and Affect: Affect normal.      LABORATORY DATA:  I have reviewed the data as listed Lab Results  Component Value Date   WBC 7.0 11/24/2020   HGB 12.0 11/24/2020   HCT 36.6 11/24/2020   MCV 93.4 11/24/2020   PLT 224 11/24/2020   Recent Labs    01/21/20 1123 02/29/20 0802 05/03/20 1116 05/24/20 1334 11/24/20 1312  NA 134* 139  --  138 137  K 4.7 3.9  --  4.5 4.4  CL 96* 99  --  100 101  CO2 29 25  --  28 27  GLUCOSE 364* 114*  --  225* 143*  BUN 27* 30*  --  29* 27*  CREATININE 0.93 0.98  --  1.12* 1.22*  CALCIUM 9.5 9.4  --  9.2 9.2  GFRNONAA >60 59*  --  50* 48*  GFRAA >60 >60  --  58*  --   PROT  --  6.7  --  7.0 7.7  ALBUMIN  --  3.7  --  3.9 4.1  AST  --  24  --  37 169*  ALT  --  14 12 27  79*  ALKPHOS  --  91  --  119 150*  BILITOT  --  0.5  --  0.7 0.8    RADIOGRAPHIC STUDIES: I have personally reviewed the radiological images as listed and agreed with the findings in the report. ECHOCARDIOGRAM COMPLETE  Result Date: 11/05/2020    ECHOCARDIOGRAM REPORT   Patient Name:   Molly Hill Date of Exam: 11/05/2020 Medical Rec #:  [id number]      Height:  60.0 in Accession #:    [id number]     Weight:       151.4 lb Date of Birth:  10/21/1950     BSA:          1.658 m Patient Age:    60 years       BP:           140/52 mmHg Patient Gender: F              HR:           80 bpm. Exam Location:  [facility] Procedure: 2D Echo, Cardiac Doppler and Color Doppler Indications:    I35.0 Nonrheumatic aortic (valve) stenosis  History:        Patient has prior history of Echocardiogram examinations, most                 recent 05/03/2020. CAD; Risk Factors:Hypertension, Diabetes and                 Dyslipidemia. Bicuspid aortic valve. Mitral regurgitation.                 Carotid stenosis.  Anemia. History of breast cancer. Bilateral                 breast mastectomy.  Sonographer:    Diamond Nickel RCS Referring Phys: [place]  1. Normal LV function; grade 2 diastolic dysfunction; mild to moderate AS (mean gradient 21 mmHg; DI .31); mild AI; mild MR.  2. Left ventricular ejection fraction, by estimation, is 60 to 65%. The left ventricle has normal function. The left ventricle has no regional wall motion abnormalities. There is mild left ventricular hypertrophy. Left ventricular diastolic parameters are consistent with Grade II diastolic dysfunction (pseudonormalization).  3. Right ventricular systolic function is normal. The right ventricular size is normal.  4. The mitral valve is normal in structure. Mild mitral valve regurgitation. No evidence of mitral stenosis. Moderate mitral annular calcification.  5. The aortic valve has an indeterminant number of cusps. Aortic valve regurgitation is mild. Mild to moderate aortic valve stenosis.  6. The inferior vena cava is normal in size with greater than 50% respiratory variability, suggesting right atrial pressure of 3 mmHg. FINDINGS  Left Ventricle: Left ventricular ejection fraction, by estimation, is 60 to 65%. The left ventricle has normal function. The left ventricle has no regional wall motion abnormalities. The left ventricular internal cavity size was normal in size. There is  mild left ventricular hypertrophy. Left ventricular diastolic parameters are consistent with Grade II diastolic dysfunction (pseudonormalization). Right Ventricle: The right ventricular size is normal.Right ventricular systolic function is normal. Left Atrium: Left atrial size was normal in size. Right Atrium: Right atrial size was normal in size. Pericardium: There is no evidence of pericardial effusion. Mitral Valve: The mitral valve is normal in structure. Moderate mitral annular calcification. Mild mitral valve regurgitation. No evidence of mitral  valve stenosis. Tricuspid Valve: The tricuspid valve is normal in structure. Tricuspid valve regurgitation is trivial. No evidence of tricuspid stenosis. Aortic Valve: The aortic valve has an indeterminant number of cusps. Aortic valve regurgitation is mild. Mild to moderate aortic stenosis is present. Aortic valve mean gradient measures 19.0 mmHg. Aortic valve peak gradient measures 35.4 mmHg. Aortic valve area, by VTI measures 0.86 cm. Pulmonic Valve: The pulmonic valve was normal in structure. Pulmonic valve regurgitation is trivial. No evidence of pulmonic stenosis. Aorta: The aortic root is normal in size  and structure. Venous: The inferior vena cava is normal in size with greater than 50% respiratory variability, suggesting right atrial pressure of 3 mmHg. IAS/Shunts: No atrial level shunt detected by color flow Doppler. Additional Comments: Normal LV function; grade 2 diastolic dysfunction; mild to moderate AS (mean gradient 21 mmHg; DI .31); mild AI; mild MR.  LEFT VENTRICLE PLAX 2D LVIDd:         3.40 cm  Diastology LVIDs:         2.10 cm  LV e' medial:    6.23 cm/s LV PW:         1.30 cm  LV E/e' medial:  15.3 LV IVS:        1.30 cm  LV e' lateral:   4.78 cm/s LVOT diam:     1.85 cm  LV E/e' lateral: 20.0 LV SV:         64 LV SV Index:   38 LVOT Area:     2.69 cm  RIGHT VENTRICLE RV Basal diam:  2.10 cm RV S prime:     9.90 cm/s TAPSE (M-mode): 2.3 cm LEFT ATRIUM             Index       RIGHT ATRIUM           Index LA diam:        4.30 cm 2.59 cm/m  RA Area:     10.50 cm LA Vol (A2C):   42.1 ml 25.39 ml/m RA Volume:   21.00 ml  12.66 ml/m LA Vol (A4C):   34.5 ml 20.80 ml/m LA Biplane Vol: 38.5 ml 23.22 ml/m  AORTIC VALVE AV Area (Vmax):    0.79 cm AV Area (Vmean):   0.82 cm AV Area (VTI):     0.86 cm AV Vmax:           297.50 cm/s AV Vmean:          202.500 cm/s AV VTI:            0.740 m AV Peak Grad:      35.4 mmHg AV Mean Grad:      19.0 mmHg LVOT Vmax:         87.90 cm/s LVOT Vmean:         61.400 cm/s LVOT VTI:          0.237 m LVOT/AV VTI ratio: 0.32  AORTA Ao Root diam: 2.70 cm MITRAL VALVE MV Area (PHT): 3.39 cm    SHUNTS MV Decel Time: 224 msec    Systemic VTI:  0.24 m MR Peak grad: 134.1 mmHg   Systemic Diam: 1.85 cm MR Mean grad: 86.0 mmHg MR Vmax:      579.00 cm/s MR Vmean:     440.0 cm/s MV E velocity: 95.60 cm/s MV A velocity: 82.90 cm/s MV E/A ratio:  1.15 Kirk Ruths MD Electronically signed by Kirk Ruths MD Signature Date/Time: 11/05/2020/12:34:16 PM    Final     ASSESSMENT & PLAN:   Carcinoma of overlapping sites of right breast in female, estrogen receptor negative ([place]) Bilateral breast cancer triple negative [September 2016]- STABLE;.  No evidence of recurrence.  Continue surveillance.  #Gaining weight/abdominal distention-elevated LFTs; normal bilirubin/slight worsening renal function- GFR-48.  Etiology unclear.  No nephrotoxic/hepatotoxic drugs. Recommend US abdomen ASAP [[place]].  Check TSH.  #Anemia mild hemoglobin 12.2- STABLE.  *d [place] # DISPOSITION: add TSH # US abdomen ASAP # follow up TBD-Dr.B     All questions were  answered. The patient knows to call the clinic with any problems, questions or concerns.    Cammie Sickle, MD 11/24/2020 2:25 PM

## 2020-11-26 ENCOUNTER — Telehealth: Payer: Self-pay | Admitting: *Deleted

## 2020-11-26 ENCOUNTER — Other Ambulatory Visit: Payer: Self-pay

## 2020-11-26 ENCOUNTER — Ambulatory Visit (HOSPITAL_COMMUNITY)
Admission: RE | Admit: 2020-11-26 | Discharge: 2020-11-26 | Disposition: A | Discharge status: Home / Self Care | Payer: Medicare HMO | Source: Ambulatory Visit | Attending: Internal Medicine | Admitting: Internal Medicine

## 2020-11-26 DIAGNOSIS — C50811 Malignant neoplasm of overlapping sites of right female breast: Secondary | ICD-10-CM | POA: Diagnosis present

## 2020-11-26 DIAGNOSIS — Z171 Estrogen receptor negative status [ER-]: Secondary | ICD-10-CM

## 2020-11-26 DIAGNOSIS — R140 Abdominal distension (gaseous): Secondary | ICD-10-CM

## 2020-11-26 NOTE — Telephone Encounter (Signed)
Patient called reporting that she sees the results of her [place] and is asking if anything needs to be done about the cyst on her kidney or not. Please advise  CLINICAL DATA:  abdominal distension; elevated LFT  EXAM: ABDOMEN ULTRASOUND COMPLETE  COMPARISON:  12/30/2009 and prior.  FINDINGS: Gallbladder: Surgically absent.  Common bile duct: Diameter: 12 mm, compatible with post cholecystectomy sequela.  Liver: No focal lesion identified. Within normal limits in parenchymal echogenicity. Portal vein is patent on color Doppler imaging with normal direction of blood flow towards the liver.  IVC: Visualized portion is unremarkable.  Pancreas: Visualized portion unremarkable.  Spleen: Size and appearance within normal limits.  Right Kidney: Length: 9.3 cm. Echogenicity within normal limits. No hydronephrosis visualized. 1.3 cm complex cyst.  Left Kidney: Length: 9.9 cm. Echogenicity within normal limits. No mass or hydronephrosis visualized.  Abdominal aorta: No aneurysm visualized.  Other findings: None.  IMPRESSION: 1.3 cm complex cyst.  Post cholecystectomy sequela.   Electronically Signed   By: Primitivo Gauze M.D.   On: 11/26/2020 09:01

## 2020-11-29 ENCOUNTER — Other Ambulatory Visit: Payer: Self-pay | Admitting: *Deleted

## 2020-11-29 ENCOUNTER — Telehealth: Payer: Self-pay | Admitting: *Deleted

## 2020-11-29 DIAGNOSIS — R0609 Other forms of dyspnea: Secondary | ICD-10-CM

## 2020-11-29 DIAGNOSIS — R0600 Dyspnea, unspecified: Secondary | ICD-10-CM

## 2020-11-29 DIAGNOSIS — R7989 Other specified abnormal findings of blood chemistry: Secondary | ICD-10-CM

## 2020-11-29 NOTE — Telephone Encounter (Signed)
Spoke with patient to provide apts for additional labs. Dr. Rogue Bussing had already spoken to patient to provider with the ultrasound results and plan of care. No follow-up needed at this time for the renal cyst. However, patient will need additional labs- metc/ acute hepatitis panel and chest xray. Patient prefers to get the chest xray at [facility] long as this location is closer per patient. verbal Orders from Dr. Rogue Bussing for labs/cxr entered

## 2020-11-29 NOTE — Telephone Encounter (Signed)
Md spoke with patient regarding plan of care.  *see phone note from RN

## 2020-11-30 ENCOUNTER — Other Ambulatory Visit (HOSPITAL_COMMUNITY): Payer: Medicare HMO

## 2020-12-01 ENCOUNTER — Telehealth: Payer: Self-pay | Admitting: Internal Medicine

## 2020-12-01 NOTE — Telephone Encounter (Signed)
Patient has a lab apt - 12/03/20

## 2020-12-01 NOTE — Telephone Encounter (Signed)
On 2/11-I called and spoke to patient regarding results of her ultrasound abdomen. Does not explain the slightly elevated LFTs; and  mildly abnormal renal function. Recommend repeating labs in 1 week. Also check chest x-ray regarding ongoing shortness of breath. Patient in agreement.

## 2020-12-02 ENCOUNTER — Ambulatory Visit (HOSPITAL_COMMUNITY)
Admission: RE | Admit: 2020-12-02 | Discharge: 2020-12-02 | Disposition: A | Discharge status: Home / Self Care | Payer: Medicare HMO | Source: Ambulatory Visit | Attending: Internal Medicine | Admitting: Internal Medicine

## 2020-12-02 ENCOUNTER — Other Ambulatory Visit: Payer: Self-pay

## 2020-12-02 DIAGNOSIS — R0600 Dyspnea, unspecified: Secondary | ICD-10-CM | POA: Diagnosis present

## 2020-12-02 DIAGNOSIS — R0609 Other forms of dyspnea: Secondary | ICD-10-CM

## 2020-12-03 ENCOUNTER — Inpatient Hospital Stay: Payer: Medicare HMO

## 2020-12-03 DIAGNOSIS — R7989 Other specified abnormal findings of blood chemistry: Secondary | ICD-10-CM

## 2020-12-03 DIAGNOSIS — C50811 Malignant neoplasm of overlapping sites of right female breast: Secondary | ICD-10-CM | POA: Diagnosis not present

## 2020-12-03 LAB — COMPREHENSIVE METABOLIC PANEL
ALT: 36 U/L (ref 0–44)
AST: 27 U/L (ref 15–41)
Albumin: 4.3 g/dL (ref 3.5–5.0)
Alkaline Phosphatase: 137 U/L — ABNORMAL HIGH (ref 38–126)
Anion gap: 10 (ref 5–15)
BUN: 28 mg/dL — ABNORMAL HIGH (ref 8–23)
CO2: 28 mmol/L (ref 22–32)
Calcium: 9.7 mg/dL (ref 8.9–10.3)
Chloride: 97 mmol/L — ABNORMAL LOW (ref 98–111)
Creatinine, Ser: 1.09 mg/dL — ABNORMAL HIGH (ref 0.44–1.00)
GFR, Estimated: 55 mL/min — ABNORMAL LOW (ref >60)
Glucose, Bld: 64 mg/dL — ABNORMAL LOW (ref 70–99)
Potassium: 4.3 mmol/L (ref 3.5–5.1)
Sodium: 135 mmol/L (ref 135–145)
Total Bilirubin: 0.7 mg/dL (ref 0.3–1.2)
Total Protein: 7.9 g/dL (ref 6.5–8.1)

## 2020-12-03 LAB — HEPATITIS PANEL, ACUTE
HCV Ab: NONREACTIVE
Hep A IgM: NONREACTIVE
Hep B C IgM: NONREACTIVE
Hepatitis B Surface Ag: NONREACTIVE

## 2020-12-06 ENCOUNTER — Telehealth: Payer: Self-pay | Admitting: Internal Medicine

## 2020-12-06 NOTE — Telephone Encounter (Signed)
On 1/18-I spoke to patient regarding results of her LFTs improved; except for mildly elevated alkaline phosphatase.  Hepatitis panel negative.  Chest x-ray negative.  Will discuss with radiology regarding-right kidney cyst work-up.   Given patient's ongoing shortness of breath-recommend further evaluation with pulmonary.  Will discuss with PCP.  H/T-please leave a message for patient's PCP's office to discuss above recommendations.   Follow-up as planned in march as planned- MD; llabsCBC/CMP.

## 2020-12-07 NOTE — Telephone Encounter (Signed)
LVM with PCP office to call Dr. B directly at their convenience in regards to patient.

## 2020-12-10 ENCOUNTER — Telehealth: Payer: Self-pay | Admitting: Internal Medicine

## 2020-12-10 NOTE — Telephone Encounter (Signed)
On 2/21-I spoke with patient's primary care physician; agrees to make a pulmonary referral.  Patient follow-up with me next planned in march-we will discuss imaging for the complex cyst in the right kidney.

## 2020-12-12 ENCOUNTER — Other Ambulatory Visit: Payer: Self-pay | Admitting: Cardiology

## 2020-12-20 ENCOUNTER — Telehealth: Payer: Self-pay | Admitting: Internal Medicine

## 2020-12-20 NOTE — Telephone Encounter (Signed)
I called the patient back. I explained to the patient that Dr. B was following up with the patient's cyst on the kidney. I asked the patient to keep her apts as scheduled. She is not able to come to the office on 3/10 due to other apts.  She was agreeable to r/s her apts to 12/27/20 at 3pm for labs/ 3:15 pm with Dr. Rogue Bussing. She declined virtual apt with md when offered.

## 2020-12-20 NOTE — Telephone Encounter (Signed)
Patient called because she received a notification about an upcoming appt on 3/10. Patient states that she was just here on 2/9 and that she doesn't feel this appt is needed. Patient also stated that she reviewed [place] results with Dr. B via phone.   Per LOS on 2/9= "f/u TBD".  Appointment cancelled and routing to team to confirm.

## 2020-12-23 ENCOUNTER — Ambulatory Visit: Payer: Medicare HMO | Admitting: Internal Medicine

## 2020-12-23 ENCOUNTER — Other Ambulatory Visit: Payer: Medicare HMO

## 2020-12-27 ENCOUNTER — Other Ambulatory Visit: Payer: Self-pay

## 2020-12-27 ENCOUNTER — Inpatient Hospital Stay: Payer: Medicare HMO | Attending: Internal Medicine

## 2020-12-27 ENCOUNTER — Encounter: Payer: Self-pay | Admitting: Internal Medicine

## 2020-12-27 ENCOUNTER — Inpatient Hospital Stay: Payer: Medicare HMO | Admitting: Internal Medicine

## 2020-12-27 VITALS — BP 162/56 | HR 68 | Temp 97.3°F | Resp 18 | Ht 60.0 in | Wt 160.0 lb

## 2020-12-27 DIAGNOSIS — E1022 Type 1 diabetes mellitus with diabetic chronic kidney disease: Secondary | ICD-10-CM | POA: Diagnosis not present

## 2020-12-27 DIAGNOSIS — Z7982 Long term (current) use of aspirin: Secondary | ICD-10-CM | POA: Diagnosis not present

## 2020-12-27 DIAGNOSIS — E785 Hyperlipidemia, unspecified: Secondary | ICD-10-CM | POA: Insufficient documentation

## 2020-12-27 DIAGNOSIS — I129 Hypertensive chronic kidney disease with stage 1 through stage 4 chronic kidney disease, or unspecified chronic kidney disease: Secondary | ICD-10-CM | POA: Insufficient documentation

## 2020-12-27 DIAGNOSIS — Z171 Estrogen receptor negative status [ER-]: Secondary | ICD-10-CM | POA: Diagnosis not present

## 2020-12-27 DIAGNOSIS — N281 Cyst of kidney, acquired: Secondary | ICD-10-CM

## 2020-12-27 DIAGNOSIS — R0602 Shortness of breath: Secondary | ICD-10-CM | POA: Insufficient documentation

## 2020-12-27 DIAGNOSIS — N183 Chronic kidney disease, stage 3 unspecified: Secondary | ICD-10-CM | POA: Insufficient documentation

## 2020-12-27 DIAGNOSIS — Z79899 Other long term (current) drug therapy: Secondary | ICD-10-CM | POA: Diagnosis not present

## 2020-12-27 DIAGNOSIS — C50811 Malignant neoplasm of overlapping sites of right female breast: Secondary | ICD-10-CM | POA: Diagnosis present

## 2020-12-27 DIAGNOSIS — Z87891 Personal history of nicotine dependence: Secondary | ICD-10-CM | POA: Insufficient documentation

## 2020-12-27 DIAGNOSIS — I2510 Atherosclerotic heart disease of native coronary artery without angina pectoris: Secondary | ICD-10-CM | POA: Insufficient documentation

## 2020-12-27 LAB — COMPREHENSIVE METABOLIC PANEL
ALT: 16 U/L (ref 0–44)
AST: 24 U/L (ref 15–41)
Albumin: 4 g/dL (ref 3.5–5.0)
Alkaline Phosphatase: 106 U/L (ref 38–126)
Anion gap: 9 (ref 5–15)
BUN: 32 mg/dL — ABNORMAL HIGH (ref 8–23)
CO2: 25 mmol/L (ref 22–32)
Calcium: 9 mg/dL (ref 8.9–10.3)
Chloride: 98 mmol/L (ref 98–111)
Creatinine, Ser: 1.29 mg/dL — ABNORMAL HIGH (ref 0.44–1.00)
GFR, Estimated: 45 mL/min — ABNORMAL LOW (ref >60)
Glucose, Bld: 312 mg/dL — ABNORMAL HIGH (ref 70–99)
Potassium: 4.4 mmol/L (ref 3.5–5.1)
Sodium: 132 mmol/L — ABNORMAL LOW (ref 135–145)
Total Bilirubin: 0.7 mg/dL (ref 0.3–1.2)
Total Protein: 7.3 g/dL (ref 6.5–8.1)

## 2020-12-27 LAB — CBC WITH DIFFERENTIAL/PLATELET
Abs Immature Granulocytes: 0.02 10*3/uL (ref 0.00–0.07)
Basophils Absolute: 0 10*3/uL (ref 0.0–0.1)
Basophils Relative: 0 %
Eosinophils Absolute: 0.2 10*3/uL (ref 0.0–0.5)
Eosinophils Relative: 3 %
HCT: 35.3 % — ABNORMAL LOW (ref 36.0–46.0)
Hemoglobin: 11.6 g/dL — ABNORMAL LOW (ref 12.0–15.0)
Immature Granulocytes: 0 %
Lymphocytes Relative: 21 %
Lymphs Abs: 1.8 10*3/uL (ref 0.7–4.0)
MCH: 30.7 pg (ref 26.0–34.0)
MCHC: 32.9 g/dL (ref 30.0–36.0)
MCV: 93.4 fL (ref 80.0–100.0)
Monocytes Absolute: 0.6 10*3/uL (ref 0.1–1.0)
Monocytes Relative: 7 %
Neutro Abs: 5.9 10*3/uL (ref 1.7–7.7)
Neutrophils Relative %: 69 %
Platelets: 223 10*3/uL (ref 150–400)
RBC: 3.78 MIL/uL — ABNORMAL LOW (ref 3.87–5.11)
RDW: 13.2 % (ref 11.5–15.5)
WBC: 8.5 10*3/uL (ref 4.0–10.5)
nRBC: 0 % (ref 0.0–0.2)

## 2020-12-27 NOTE — Assessment & Plan Note (Addendum)
Bilateral breast cancer triple negative [September 2016]- stable.  No evidence of recurrence.  Continue surveillance.  # FEB 2022- Incidental right kidney1.3 cm complex cyst.  A similar but slightly smaller size cyst noted on the CT scan from 2009.  Discussed with radiology; recommend repeat ultrasound in 1 year.  # Anemia mild hemoglobin 11-12/? Sec to CKD- III-stable.  # CKD- stage III- GFR 45 [DM/HTN]-discussed with patient the importance of optimal control of blood pressure/blood glucose levels.  Blood glucose poorly controlled today -postprandial 312. Recommend follow up with PCP.  # DISPOSITION:  # Follow up in 12 months MD; labs- cbc/cmp; US kidneys prior-- Dr.B

## 2020-12-27 NOTE — Progress Notes (Signed)
[facility] CONSULT NOTE  Patient Care Team: Ernestene Kiel, MD as PCP - General (General Practice) Sueanne Margarita, MD as PCP - Cardiology (Cardiology) Cammie Sickle, MD as Consulting Physician (Hematology and Oncology)  CHIEF COMPLAINTS/PURPOSE OF CONSULTATION: breast cancer  #  Oncology History Overview Note  #September 2016-multifocal right breast cancer triple negative [ER/PR HER-2 negative; [facility]]-PET-no metastatic disease; dose dense Adriamycin followed by weekly Taxol.  November 05, 2013-right mastectomy- [id number]-complete pathologic response.  status post adjuvant right ax.radiation Left breast cancer- as pt/reocds s/p mastectomy-no malignancy noted.    # DM- on insulin [since 8676]  # SURVIVORSHIP:   # GENETICS: NEG as per pt.   DIAGNOSIS: Right breast cancer  STAGE: early stage        ;  GOALS: cure  CURRENT/MOST RECENT THERAPY : Surveillance      Carcinoma of overlapping sites of right breast in female, estrogen receptor negative ([place])  12/24/2019 Initial Diagnosis   Carcinoma of overlapping sites of right breast in female, estrogen receptor negative ([place])      HISTORY OF PRESENTING ILLNESS:   [facility] 70 y.o.  female above history of bilateral triple negative breast cancer is here for follow-up/review results of the ultrasound abdomen.  With regards to her ongoing shortness of breath on exertion patient in the interim had PFTs done.  I have spoken to patient's PCP; currently awaiting follow-up with pulmonary.   Denies any worsening shortness of breath or cough.  No abdominal pain.  No constipation.   Review of Systems  Constitutional: Positive for malaise/fatigue. Negative for chills, diaphoresis, fever and weight loss.  HENT: Negative for nosebleeds and sore throat.   Eyes: Negative for double vision.  Respiratory: Negative for cough, hemoptysis, sputum production, shortness of breath and wheezing.    Cardiovascular: Negative for chest pain, palpitations, orthopnea and leg swelling.  Gastrointestinal: Negative for abdominal pain, blood in stool, constipation, diarrhea, heartburn, melena, nausea and vomiting.  Genitourinary: Negative for dysuria, frequency and urgency.  Musculoskeletal: Positive for joint pain. Negative for back pain.  Skin: Negative.  Negative for itching and [name].  Neurological: Negative for dizziness, tingling, focal weakness, weakness and headaches.  Endo/Heme/Allergies: Does not bruise/bleed easily.  Psychiatric/Behavioral: Negative for depression. The patient is not nervous/anxious and does not have insomnia.      MEDICAL HISTORY:  Past Medical History:  Diagnosis Date  . Alopecia   . Anemia   . Bicuspid aortic valve    mild to moderate AS by echo 2022  . Carotid stenosis    1-39% bilateral carotid stenosis by dopplers 07/2020  . Convalescence after chemotherapy   . Coronary arteriosclerosis in native artery 2013   s/p PCI of LAD  . DM (diabetes mellitus), type 1 ([name])   . Estrogen receptor negative   . History of breast cancer   . Hyperglycemia   . Hyperlipidemia   . Hypertension   . Mild dehydration   . Mitral regurgitation    mild to moderate by echo 2020  . Murmur, heart   . Swelling of joint of both wrists   . Tendinitis of left wrist     SURGICAL HISTORY: Past Surgical History:  Procedure Laterality Date  . ANKLE SURGERY Bilateral   . CATARACT EXTRACTION, BILATERAL    . CHOLECYSTECTOMY    . CORONARY STENT INTERVENTION    . CTR    . ELBOW SURGERY    . HAND SURGERY     BOTH  THUMBS AND MIDDLE FINGERS  . LEFT BREAST MASTECTOMY    . MASCULAR  REPAIR    . PORT-A-CATH REMOVAL    . PORTACATH PLACEMENT    . RIGHT MODIFIED MASTECTOMY Right   . UPPER LIP SURGERY      SOCIAL HISTORY: Social History   Socioeconomic History  . Marital status: Widowed    Spouse name: Not on file  . Number of children: Not on file  . Years of education:  Not on file  . Highest education level: Not on file  Occupational History  . Not on file  Tobacco Use  . Smoking status: Former Research scientist (life sciences)  . Smokeless tobacco: Never Used  Substance and Sexual Activity  . Alcohol use: Never  . Drug use: Never  . Sexual activity: Never  Other Topics Concern  . Not on file  Social History Narrative  . Not on file   Social Determinants of Health   Financial Resource Strain: Not on file  Food Insecurity: Not on file  Transportation Needs: Not on file  Physical Activity: Not on file  Stress: Not on file  Social Connections: Not on file  Intimate Partner Violence: Not on file    FAMILY HISTORY: Family History  Problem Relation Age of Onset  . Heart attack Mother   . CAD Father        CABG  . Diabetes Father   . Stroke Father   . Colon cancer Father        43s  . Diabetes Brother   . Diabetes Paternal Grandfather   . Heart attack Paternal Grandfather   . Breast cancer Maternal Aunt        27s    ALLERGIES:  is allergic to codeine and reglan [metoclopramide].  MEDICATIONS:  Current Outpatient Medications  Medication Sig Dispense Refill  . amLODipine (NORVASC) 2.5 MG tablet TAKE 1 TABLET EVERY DAY 90 tablet 1  . aspirin EC 81 MG tablet Take 81 mg by mouth daily.    Marland Kitchen atorvastatin (LIPITOR) 40 MG tablet Take 40 mg by mouth daily.    . Biotin w/ Vitamins C & E (HAIR/SKIN/NAILS PO) Take by mouth.    . Calcium Carb-Cholecalciferol (CALCIUM 600 + D PO) Take by mouth.    . celecoxib (CELEBREX) 200 MG capsule Take 200 mg by mouth 2 (two) times daily.    Marland Kitchen docusate sodium (COLACE) 100 MG capsule Take 100 mg by mouth daily as needed for mild constipation.    . Efinaconazole 10 % SOLN Apply 1 drop topically daily. 4 mL 11  . fexofenadine (ALLEGRA) 180 MG tablet Take 180 mg by mouth as needed.     . fluticasone (FLONASE) 50 MCG/ACT nasal spray Place into both nostrils as needed.     Marland Kitchen glucose blood test strip 1 each by Other route as needed for  other. Use as instructed    . insulin NPH Human (NOVOLIN N) 100 UNIT/ML injection Inject into the skin.    Marland Kitchen insulin regular (NOVOLIN R) 100 units/mL injection Inject 100 Units into the skin 3 (three) times daily before meals.    . Metoprolol Succinate 25 MG CS24 Take 25 mg by mouth.    . NON FORMULARY [facility]  Antifungal (nail)-#1/no urea 10%    . ramipril (ALTACE) 10 MG capsule Take 10 mg by mouth daily.    Marland Kitchen loperamide (IMODIUM) 1 MG/5ML solution Take by mouth as needed for diarrhea or loose stools. (Patient not taking: No sig reported)    .  nitroGLYCERIN (NITROSTAT) 0.4 MG SL tablet Place 0.4 mg under the tongue every 5 (five) minutes as needed for chest pain. (Patient not taking: No sig reported)     No current facility-administered medications for this visit.      Marland Kitchen  PHYSICAL EXAMINATION: ECOG PERFORMANCE STATUS: 0 - Asymptomatic  Vitals:   12/27/20 1526  BP: (!) 162/56  Pulse: 68  Resp: 18  Temp: (!) 97.3 F (36.3 C)  SpO2: 100%   Filed Weights   12/27/20 1526  Weight: 160 lb (72.6 kg)    Physical Exam Constitutional:      Comments: Ambulating independently.  Alone.  HENT:     Head: Normocephalic and atraumatic.     Mouth/Throat:     Pharynx: No oropharyngeal exudate.  Eyes:     Pupils: Pupils are equal, round, and reactive to light.  Cardiovascular:     Rate and Rhythm: Normal rate and regular rhythm.  Pulmonary:     Effort: No respiratory distress.     Breath sounds: No wheezing.  Abdominal:     General: Bowel sounds are normal. There is no distension.     Palpations: Abdomen is soft. There is no mass.     Tenderness: There is no abdominal tenderness. There is no guarding or rebound.  Musculoskeletal:        General: No tenderness. Normal range of motion.     Cervical back: Normal range of motion and neck supple.  Skin:    General: Skin is warm.     Comments: Bilateral mastectomies noted.  No lumps or bumps noted.  Positive chest wall  telangiectasia.  Neurological:     Mental Status: She is alert and oriented to person, place, and time.  Psychiatric:        Mood and Affect: Affect normal.      LABORATORY DATA:  I have reviewed the data as listed Lab Results  Component Value Date   WBC 8.5 12/27/2020   HGB 11.6 (L) 12/27/2020   HCT 35.3 (L) 12/27/2020   MCV 93.4 12/27/2020   PLT 223 12/27/2020   Recent Labs    01/21/20 1123 01/21/20 1123 02/29/20 0802 05/03/20 1116 05/24/20 1334 11/24/20 1312 12/03/20 1106 12/27/20 1507  NA 134*  --  139  --  138 137 135 132*  K 4.7  --  3.9  --  4.5 4.4 4.3 4.4  CL 96*  --  99  --  100 101 97* 98  CO2 29  --  25  --  28 27 28 25   GLUCOSE 364*  --  114*  --  225* 143* 64* 312*  BUN 27*  --  30*  --  29* 27* 28* 32*  CREATININE 0.93  --  0.98  --  1.12* 1.22* 1.09* 1.29*  CALCIUM 9.5  --  9.4  --  9.2 9.2 9.7 9.0  GFRNONAA >60  --  59*  --  50* 48* 55* 45*  GFRAA >60  --  >60  --  58*  --   --   --   PROT  --    < > 6.7  --  7.0 7.7 7.9 7.3  ALBUMIN  --    < > 3.7  --  3.9 4.1 4.3 4.0  AST  --    < > 24  --  37 169* 27 24  ALT  --   --  14   < > 27 79* 36 16  ALKPHOS  --    < >  91  --  119 150* 137* 106  BILITOT  --    < > 0.5  --  0.7 0.8 0.7 0.7   < > = values in this interval not displayed.    RADIOGRAPHIC STUDIES: I have personally reviewed the radiological images as listed and agreed with the findings in the report. DG Chest 2 View  Result Date: 12/03/2020 CLINICAL DATA:  Chronic shortness of breath EXAM: CHEST - 2 VIEW COMPARISON:  September 04, 2019 chest radiograph and chest CT August 05, 2019 FINDINGS: There is ill-defined opacity in the right apex, stable. Lungs elsewhere are clear. Heart size and pulmonary vascularity are normal. No adenopathy. No bone lesions. There is aortic atherosclerosis. IMPRESSION: Apparent scarring right apex, stable. Lungs elsewhere clear. Heart size and pulmonary vascularity are normal. Aortic Atherosclerosis (ICD10-I70.0).  Electronically Signed   By: Lowella Grip III M.D.   On: 12/03/2020 16:17    ASSESSMENT & PLAN:   Carcinoma of overlapping sites of right breast in female, estrogen receptor negative ([place]) Bilateral breast cancer triple negative [September 2016]- stable.  No evidence of recurrence.  Continue surveillance.  # FEB 2022- Incidental right kidney1.3 cm complex cyst.  A similar but slightly smaller size cyst noted on the CT scan from 2009.  Discussed with radiology; recommend repeat ultrasound in 1 year.  # Anemia mild hemoglobin 11-12/? Sec to CKD- III-stable.  # CKD- stage III- GFR 45 [DM/HTN]-discussed with patient the importance of optimal control of blood pressure/blood glucose levels.  Blood glucose poorly controlled today -postprandial 312. Recommend follow up with PCP.  # DISPOSITION:  # Follow up in 12 months MD; labs- cbc/cmp; US kidneys prior-- Dr.B    All questions were answered. The patient knows to call the clinic with any problems, questions or concerns.    Cammie Sickle, MD 12/27/2020 7:50 PM

## 2021-01-03 ENCOUNTER — Other Ambulatory Visit: Payer: Self-pay | Admitting: Family

## 2021-01-03 DIAGNOSIS — R0602 Shortness of breath: Secondary | ICD-10-CM

## 2021-01-15 ENCOUNTER — Ambulatory Visit
Admission: RE | Admit: 2021-01-15 | Discharge: 2021-01-15 | Disposition: A | Discharge status: Home / Self Care | Payer: Medicare HMO | Source: Ambulatory Visit | Attending: Family | Admitting: Family

## 2021-01-15 ENCOUNTER — Other Ambulatory Visit: Payer: Self-pay

## 2021-01-15 DIAGNOSIS — R0602 Shortness of breath: Secondary | ICD-10-CM

## 2021-01-20 ENCOUNTER — Encounter: Payer: Self-pay | Admitting: Pulmonary Disease

## 2021-01-20 ENCOUNTER — Other Ambulatory Visit: Payer: Self-pay

## 2021-01-20 ENCOUNTER — Ambulatory Visit: Payer: Medicare HMO | Admitting: Pulmonary Disease

## 2021-01-20 VITALS — BP 118/60 | HR 75 | Temp 97.3°F | Ht 60.0 in | Wt 161.6 lb

## 2021-01-20 DIAGNOSIS — R0600 Dyspnea, unspecified: Secondary | ICD-10-CM | POA: Diagnosis not present

## 2021-01-20 DIAGNOSIS — R0609 Other forms of dyspnea: Secondary | ICD-10-CM

## 2021-01-20 MED ORDER — BREO ELLIPTA 100-25 MCG/INH IN AEPB: 1.0000 | INHALATION_SPRAY | Freq: Every day | RESPIRATORY_TRACT | 11 refills | Status: DC

## 2021-01-20 NOTE — Patient Instructions (Signed)
Nice to meet you  Your CT scan shows chronic changes related to radiation, but nothing new that accounts for the symptoms.  Given your allergy symptoms, I worry a little bit about late onset asthma.  For this, use Breo 1 puff daily.  Please rinse out your mouth after every use.  I am hopeful this will help with the shortness of breath.  If is not helping after 2 to 3 months we will stop it.  I will track down the breathing tests from your primary doctor.  It sounds like they are relatively reassuring but I will double check.    Return to clinic in 3 months or sooner as needed for follow-up with Dr.

## 2021-01-25 ENCOUNTER — Other Ambulatory Visit: Payer: Self-pay

## 2021-01-25 ENCOUNTER — Ambulatory Visit (INDEPENDENT_AMBULATORY_CARE_PROVIDER_SITE_OTHER): Payer: Medicare HMO

## 2021-01-25 ENCOUNTER — Encounter: Payer: Self-pay | Admitting: Podiatry

## 2021-01-25 ENCOUNTER — Ambulatory Visit: Payer: Medicare HMO | Admitting: Podiatry

## 2021-01-25 DIAGNOSIS — S93621A Sprain of tarsometatarsal ligament of right foot, initial encounter: Secondary | ICD-10-CM | POA: Diagnosis not present

## 2021-01-25 DIAGNOSIS — M79671 Pain in right foot: Secondary | ICD-10-CM

## 2021-01-25 NOTE — Patient Instructions (Signed)
Foot Sprain  A foot sprain is an injury to one of the ligaments in the feet. Ligaments are strong tissues that connect bones to each other. The ligament can be stretched too much. In some cases, it may tear. A tear can be either partial or complete. The severity of the sprain depends on how much of the ligament was damaged or torn. What are the causes? This condition is usually caused by suddenly twisting or pivoting your foot. What increases the risk? You are more likely to develop this condition if:  You play a sport, such as basketball or football.  You exercise or play a sport without first warming up your muscles.  You start a new workout or sport.  You suddenly increase how long or hard you exercise or play a sport.  You have injured your foot or ankle before. What are the signs or symptoms? Symptoms of this condition start soon after an injury and include:  Pain, especially in the arch of your foot.  Bruising.  Swelling.  Being unable to walk or use your foot to support body weight. How is this diagnosed? This condition is diagnosed with a medical history and physical exam. You may also have imaging tests, such as:  X-rays to check for broken bones (fractures).  An MRI to see if the ligament is torn. How is this treated? Treatment for this condition depends on the severity of the sprain. Mild sprains and major sprains can be treated with:  Rest, ice, pressure (compression), and elevation (RICE). Elevation means raising your injured foot.  Keeping your foot in a fixed position (immobilization) for a period of time. This is done if your ligament is overstretched or partially torn. Your health care provider will apply a bandage, splint, or walking boot to keep your foot from moving until it heals.  Using crutches or a scooter for a few weeks to avoid bearing weight on your foot while it is healing.  Physical therapy exercises to improve movement and strength in your  foot. Major sprains may also be treated with:  Surgery. This is done if your ligament is fully torn and a procedure is needed to reconnect it to the bone.  A cast or splint. This will be needed after surgery. A cast or splint will need to stay on your foot while it heals. Follow these instructions at home: If you have a bandage, splint, or boot:  Wear it as told by your health care provider. Remove it only as told by your health care provider.  Loosen it if your toes tingle, become numb, or turn cold and blue.  Keep it clean and dry. If you have a cast:  Do not put pressure on any part of the cast until it is fully hardened. This may take several hours.  Do not stick anything inside the cast to scratch your skin. Doing that increases your risk for infection.  Check the skin around the cast every day. Tell your health care provider about any concerns.  You may put lotion on dry skin around the edges of the cast. Do not put lotion on the skin underneath the cast.  Keep it clean and dry. Bathing  Do not take baths, swim, or use a hot tub until your health care provider approves. Ask your health care provider if you may take showers. You may only be allowed to take sponge baths.  If the bandage, splint, boot, or cast is not waterproof: ? Do not let  it get wet. ? Cover it with a watertight covering when you take a bath or shower. Managing pain, stiffness, and swelling  If directed, put ice on the injured area. To do this: ? If you have a removable bandage, splint, or boot, remove it as told by your health care provider. ? Put ice in a plastic bag. ? Place a towel between your skin and the bag, or between your cast and the bag. ? Leave the ice on for 20 minutes, 2-3 times per day. ? Remove the ice if your skin turns bright red. This is very important. If you cannot feel pain, heat, or cold, you have a greater risk of damage to the area.  Move your toes often to reduce stiffness and  swelling.  Elevate the injured area above the level of your heart while you are sitting or lying down.   Activity  Do not use the injured foot to support your body weight until your health care provider says that you can. Use crutches or a scooter as told by your health care provider.  Ask your health care provider what activities are safe for you. Do exercises as told by your health care provider.  Gradually increase how much and how far you walk until your health care provider says it is safe to return to full activity. Driving  Ask your health care provider if the medicine prescribed to you requires you to avoid driving or using machinery.  Ask your health care provider when it is safe to drive if you have a bandage, splint, boot, or cast on your foot. General instructions  Take over-the-counter and prescription medicines only as told by your health care provider.  When you can walk without pain, wear supportive shoes that have stiff soles. Do not wear flip-flops. Do not walk barefoot.  Keep all follow-up visits. This is important. Contact a health care provider if:  Medicine does not help your pain.  Your bruising or swelling gets worse or does not get better with treatment.  Your splint, boot, or cast is damaged. Get help right away if:  You develop severe numbness or tingling in your foot.  Your foot turns blue, white, or gray, and it feels cold. Summary  A foot sprain is an injury to one of the ligaments in the feet. Ligaments are strong tissues that connect bones to each other.  You may need a bandage, splint, boot, or cast to support your foot while it heals. Sometimes, surgery may be needed.  You may need physical therapy exercises to improve movement and strength in your foot. This information is not intended to replace advice given to you by your health care provider. Make sure you discuss any questions you have with your health care provider. Document Revised:  01/23/2020 Document Reviewed: 01/23/2020 Elsevier Patient Education  2021 [employer].

## 2021-01-26 ENCOUNTER — Encounter: Payer: Self-pay | Admitting: Podiatry

## 2021-01-26 NOTE — Progress Notes (Signed)
  Subjective:  Patient ID: [facility], female    DOB: [birth year],  MRN: [id number]  Chief Complaint  Patient presents with  . Foot Pain    Pt states she has left foot pain that occurs on top before where toes started. Pt states pain started over the weekend. Pt states she thinks she may have twisted her foot.    70 y.o. female returns with a new injury and the the above complaint.  Think she may have twisted on Friday.  History confirmed with patient.  H its on the top despond the toes and it is worse when she bends the toes  Objective:  Physical Exam: warm, good capillary refill, no trophic changes or ulcerative lesions, normal DP and PT pulses and normal sensory exam. Left Foot: No pain lateral ankle or peroneal tendons today.  She has mild pain over the dorsal tarsometatarsal joints, negative instability for abduction stress she does have some pain in the Lisfranc interval   Radiographs: X-ray of the left foot: No fracture dislocation, moderate osteoarthritis of the tarsometatarsal joints, Lisfranc interval appears to be intact Assessment:   1. Lisfranc's sprain, right, initial encounter      Plan:  Patient was evaluated and treated and all questions answered.  -Reviewed the radiographs with the patient and I discussed with her I think she has a Lisfranc sprain.  She has a boot at home already recommended she be WBAT in this.  Advised on rest ice and use of NSAIDs for pain relief.  Return in 4 weeks for reevaluation and new x-rays if still painful, consider MRI if not improving  Return in about 4 weeks (around 02/22/2021).

## 2021-02-01 ENCOUNTER — Other Ambulatory Visit: Payer: Self-pay | Admitting: Podiatry

## 2021-02-01 DIAGNOSIS — S93621A Sprain of tarsometatarsal ligament of right foot, initial encounter: Secondary | ICD-10-CM

## 2021-02-14 ENCOUNTER — Ambulatory Visit: Payer: Medicare HMO | Admitting: Podiatry

## 2021-02-22 ENCOUNTER — Ambulatory Visit: Payer: Medicare HMO | Admitting: Podiatry

## 2021-03-01 ENCOUNTER — Ambulatory Visit: Payer: Medicare HMO | Admitting: Podiatry

## 2021-03-02 ENCOUNTER — Other Ambulatory Visit: Payer: Self-pay

## 2021-03-02 ENCOUNTER — Ambulatory Visit: Payer: Medicare HMO | Admitting: Podiatry

## 2021-03-02 DIAGNOSIS — S93621D Sprain of tarsometatarsal ligament of right foot, subsequent encounter: Secondary | ICD-10-CM

## 2021-03-02 MED ORDER — ACETAMINOPHEN-CODEINE #3 300-30 MG PO TABS: 1.0000 | ORAL_TABLET | Freq: Three times a day (TID) | ORAL | 0 refills | Status: AC | PRN

## 2021-03-02 NOTE — Progress Notes (Signed)
  Subjective:  Patient ID: Britany Callicott Puello, female    DOB: 1951-09-16,  MRN: [id number]  Chief Complaint  Patient presents with  . Foot Pain    4 week follow up Lisfranc sprain right    70 y.o. female returns with the above complaint.  Has not improved. She did not have a boot at home  Objective:  Physical Exam: warm, good capillary refill, no trophic changes or ulcerative lesions, normal DP and PT pulses and normal sensory exam. Right Foot:  She has mild pain over the dorsal tarsometatarsal joints, negative instability for abduction stress she does have some pain in the Lisfranc interval   Radiographs: X-ray of the right foot: No fracture dislocation, moderate osteoarthritis of the tarsometatarsal joints, Lisfranc interval appears to be intact Assessment:   1. Sprain of tarsometatarsal joint of right foot, subsequent encounter      Plan:  Patient was evaluated and treated and all questions answered.  Overall has had no improvement despite 1 month of nonsurgical care.  I did dispense an CAM boot today because she did not have one at home.  I also recommend that we evaluate for an MRI at this point to see if there is a Lisfranc tear.  WBAT in the CAM boot.  Tylenol 3 prescribed for pain control  Return in about 4 weeks (around 03/30/2021) for after MRI to review.

## 2021-03-15 ENCOUNTER — Telehealth: Payer: Self-pay | Admitting: Pulmonary Disease

## 2021-03-15 NOTE — Telephone Encounter (Signed)
Called and spoke with pt and she stated that she has been taking the [facility] and this had been working good for her. She said now she feels that she gets winded when she is walking for long periods of time.  She stated that she wanted to see if she should take 2 puffs daily.  I advised her to keep taking the Breo at one puff daily and we would check with MH to see if he wanted to keep her on the [facility] or change to something else.  Pt is leaving Thursday for the beach and will need a refill or new med by then.  MH please advise. Thanks

## 2021-03-16 MED ORDER — TRELEGY ELLIPTA 200-62.5-25 MCG/INH IN AEPB: 1.0000 | INHALATION_SPRAY | Freq: Every day | RESPIRATORY_TRACT | 3 refills | Status: DC

## 2021-03-16 NOTE — Telephone Encounter (Signed)
I will send new script for trelegy to see if helps. Stop Breo while taking Trelegy. If too expensive then refill Breo.

## 2021-03-16 NOTE — Telephone Encounter (Signed)
Called and spoke with Patient.  Dr. Kavin Leech recommendations given.  Understanding stated. Nothing further at this time.

## 2021-03-17 ENCOUNTER — Other Ambulatory Visit: Payer: Medicare HMO

## 2021-03-31 ENCOUNTER — Ambulatory Visit: Payer: Medicare HMO | Admitting: Podiatry

## 2021-04-19 ENCOUNTER — Telehealth: Payer: Self-pay | Admitting: Pulmonary Disease

## 2021-04-19 MED ORDER — PREDNISONE 20 MG PO TABS: 40.0000 mg | ORAL_TABLET | Freq: Every day | ORAL | 0 refills | Status: AC

## 2021-04-19 NOTE — Telephone Encounter (Signed)
Spoke with the pt and notified of response per Dr Silas Flood  Rx sent to pharm  She will call back if tests pos or is not improving

## 2021-04-19 NOTE — Telephone Encounter (Signed)
Called patient but she did not answer. Left message for patient to call back.  

## 2021-04-19 NOTE — Telephone Encounter (Signed)
Spoke with the pt  She is c/o increased SOB past 2 wks and has had hoarse voice x 3 wks  She states she is winded just walking up a few stairs  She states she is coughing some, clearing her throat with no mucus production "feels like something in throat"  She is taking trelegy and rinses and gargles after use  She wants to know if she should bother refilling it since it has not helped  Pt denies any wheezing, f/c/s, aches  Please advise, thank you!  Allergies  Allergen Reactions   Codeine     Hyper with vomiting   Reglan [Metoclopramide]     "pulls muscle to side" "feel paralyzed"

## 2021-04-19 NOTE — Telephone Encounter (Signed)
Recommend covid test, will send in prednisone 40 mg daily x 5 days. If not feeling better in 48 hours recommend office visit if covid negative vs going to ED if covid positive.

## 2021-05-04 ENCOUNTER — Encounter: Payer: Self-pay | Admitting: Pulmonary Disease

## 2021-05-04 ENCOUNTER — Ambulatory Visit: Payer: Medicare HMO | Admitting: Pulmonary Disease

## 2021-05-04 ENCOUNTER — Other Ambulatory Visit: Payer: Self-pay

## 2021-05-04 VITALS — BP 116/68 | HR 75 | Ht 64.0 in | Wt 151.8 lb

## 2021-05-04 DIAGNOSIS — R0600 Dyspnea, unspecified: Secondary | ICD-10-CM

## 2021-05-04 DIAGNOSIS — T7840XD Allergy, unspecified, subsequent encounter: Secondary | ICD-10-CM

## 2021-05-04 DIAGNOSIS — R0609 Other forms of dyspnea: Secondary | ICD-10-CM

## 2021-05-04 MED ORDER — SPACER/AERO-HOLDING CHAMBERS DEVI: 1 refills | Status: DC

## 2021-05-04 MED ORDER — BUDESONIDE-FORMOTEROL FUMARATE 160-4.5 MCG/ACT IN AERO: 2.0000 | INHALATION_SPRAY | Freq: Two times a day (BID) | RESPIRATORY_TRACT | 2 refills | Status: DC

## 2021-05-04 NOTE — Progress Notes (Signed)
@Patient  ID: Molly Hill, female    DOB: June 04, 1951, 70 y.o.   MRN: [id number]  Chief Complaint  Patient presents with   Follow-up    3 mo f/u for DOE. States the [place] worked for about 1.5 months and then stopped working. She switched to Trelegy and has not noticed a difference. She is starting to lose her voice. Increased post nasal drip.     Referring provider: Ernestene Kiel, MD  HPI:   70 year old whom we are seeing in consultation for evaluation of dyspnea on exertion.  7 telephone encounter notes from our office reviewed in the interim since last visit.  Initially started on Breo at last visit, April 2022.  That helped for a month to 6 weeks.  Seem to wear off later in the day.  In response, I prescribed Trelegy at the end of May 2022.  This helped for a week or 2 but again seem to wear off.  Associate with Trelegy use is hoarse voice, inability to sing in choir.  Dyspnea may be slightly better but similar to first meeting.  HPI initial visit: Dyspnea present for about 6 months.  Although review of records, had a stress test 04/2020 for dyspnea and chest pain.  She denies any chest pain.  Present on hills or inclines worse.  Can be present on flat surfaces or longer distances.  No time of day when things are better or worse.  No seasonal environmental factors identified to make things better or worse.  No position to make things better or worse.  No true alleviating or exacerbating factors otherwise.  In the past, work-up is included TTE that on 10/2020 revealed mild aortic insufficiency and mitral valve regurgitation.  Stress test in 2021 was low risk.  CT chest obtained earlier this month 01/16/2019 on my review and interpretation reveals mild area groundglass/scarring in the right apex, borderline bronchiectasis in the left lower lobe very mild.  CTA PE protocol 07/2019 reviewed which showed similar changes stable right upper lobe fibrosis on my interpretation.  PMH:  Hypertension, diabetes Surgical history: Ankle surgery cataract surgery left mastectomy Family history: Mother with CAD, father with CAD, diabetes, CVA Social history: Former smoker, lives in [place] / Pulmonary Flowsheets:   ACT:  No flowsheet data found.  MMRC: No flowsheet data found.  Epworth:  No flowsheet data found.  Tests:   FENO:  No results found for: NITRICOXIDE  PFT: No flowsheet data found.  WALK:  No flowsheet data found.  Imaging: Personally reviewed and as per EMR discussion this note  Lab Results: Personally reviewed and as per EMR discussion in this note, notably is as high as 300 02/03/2020 and 06/04/2020 CBC    Component Value Date/Time   WBC 8.5 12/27/2020 1507   RBC 3.78 (L) 12/27/2020 1507   HGB 11.6 (L) 12/27/2020 1507   HCT 35.3 (L) 12/27/2020 1507   PLT 223 12/27/2020 1507   MCV 93.4 12/27/2020 1507   MCH 30.7 12/27/2020 1507   MCHC 32.9 12/27/2020 1507   RDW 13.2 12/27/2020 1507   LYMPHSABS 1.8 12/27/2020 1507   MONOABS 0.6 12/27/2020 1507   EOSABS 0.2 12/27/2020 1507   BASOSABS 0.0 12/27/2020 1507    BMET    Component Value Date/Time   NA 132 (L) 12/27/2020 1507   NA 136 07/29/2019 1110   K 4.4 12/27/2020 1507   CL 98 12/27/2020 1507   CO2 25 12/27/2020 1507   GLUCOSE 312 (H) 12/27/2020  1507   BUN 32 (H) 12/27/2020 1507   BUN 21 07/29/2019 1110   CREATININE 1.29 (H) 12/27/2020 1507   CALCIUM 9.0 12/27/2020 1507   GFRNONAA 45 (L) 12/27/2020 1507   GFRAA 58 (L) 05/24/2020 1334    BNP No results found for: BNP  ProBNP No results found for: PROBNP  Specialty Problems   None   Allergies  Allergen Reactions   Codeine     Hyper with vomiting   Reglan [Metoclopramide]     "pulls muscle to side" "feel paralyzed"    Immunization History  Administered Date(s) Administered   PFIZER(Purple Top)SARS-COV-2 Vaccination 11/06/2019, 11/27/2019    Past Medical History:  Diagnosis Date   Alopecia     Anemia    Bicuspid aortic valve    mild to moderate AS by echo 2022   Carotid stenosis    1-39% bilateral carotid stenosis by dopplers 07/2020   Convalescence after chemotherapy    Coronary arteriosclerosis in native artery 2013   s/p PCI of LAD   DM (diabetes mellitus), type 1 (HCC)    Estrogen receptor negative    History of breast cancer    Hyperglycemia    Hyperlipidemia    Hypertension    Mild dehydration    Mitral regurgitation    mild to moderate by echo 2020   Murmur, heart    Swelling of joint of both wrists    Tendinitis of left wrist     Tobacco History: Social History   Tobacco Use  Smoking Status Former  Smokeless Tobacco Never   Counseling given: Not Answered   Continue to not smoke  Outpatient Encounter Medications as of 05/04/2021  Medication Sig   amLODipine (NORVASC) 2.5 MG tablet TAKE 1 TABLET EVERY DAY   aspirin EC 81 MG tablet Take 81 mg by mouth daily.   atorvastatin (LIPITOR) 40 MG tablet Take 40 mg by mouth daily.   Biotin w/ Vitamins C & E (HAIR/SKIN/NAILS PO) Take by mouth.   budesonide-formoterol (SYMBICORT) 160-4.5 MCG/ACT inhaler Inhale 2 puffs into the lungs in the morning and at bedtime.   Calcium Carb-Cholecalciferol (CALCIUM 600 + D PO) Take by mouth.   celecoxib (CELEBREX) 200 MG capsule Take 200 mg by mouth 2 (two) times daily.   docusate sodium (COLACE) 100 MG capsule Take 100 mg by mouth daily as needed for mild constipation.   Efinaconazole 10 % SOLN Apply 1 drop topically daily.   fexofenadine (ALLEGRA) 180 MG tablet Take 180 mg by mouth as needed.    fluticasone (FLONASE) 50 MCG/ACT nasal spray Place into both nostrils as needed.    glucose blood test strip 1 each by Other route as needed for other. Use as instructed   insulin NPH Human (NOVOLIN N) 100 UNIT/ML injection Inject into the skin.   insulin regular (NOVOLIN R) 100 units/mL injection Inject 100 Units into the skin 3 (three) times daily before meals.   loperamide  (IMODIUM) 1 MG/5ML solution Take by mouth as needed for diarrhea or loose stools.   Metoprolol Succinate 25 MG CS24 Take 25 mg by mouth.   nitroGLYCERIN (NITROSTAT) 0.4 MG SL tablet Place 0.4 mg under the tongue every 5 (five) minutes as needed for chest pain.   NON FORMULARY [facility]  Antifungal (nail)-#1/no urea 10%   ramipril (ALTACE) 10 MG capsule Take 10 mg by mouth daily.   Spacer/Aero-Holding Dorise Bullion Use with Symbicort. Wash once weekly with warm soapy water. Do not put in dishwasher.   [  DISCONTINUED] Fluticasone-Umeclidin-Vilant (TRELEGY ELLIPTA) 200-62.5-25 MCG/INH AEPB Inhale 1 puff into the lungs daily.   [DISCONTINUED] fluticasone furoate-vilanterol (BREO ELLIPTA) 100-25 MCG/INH AEPB Inhale 1 puff into the lungs daily.   No facility-administered encounter medications on file as of 05/04/2021.     Review of Systems  Review of Systems  N/a Physical Exam  BP 116/68   Pulse 75   Ht 5\' 4"  (1.626 m)   Wt 151 lb 12.8 oz (68.9 kg)   SpO2 99%   BMI 26.06 kg/m   Wt Readings from Last 5 Encounters:  05/04/21 151 lb 12.8 oz (68.9 kg)  01/20/21 161 lb 9.6 oz (73.3 kg)  12/27/20 160 lb (72.6 kg)  11/24/20 162 lb 2 oz (73.5 kg)  07/06/20 151 lb 6.4 oz (68.7 kg)    BMI Readings from Last 5 Encounters:  05/04/21 26.06 kg/m  01/20/21 31.56 kg/m  12/27/20 31.25 kg/m  11/24/20 31.66 kg/m  07/06/20 29.57 kg/m     Physical Exam General: Sitting in chair, no distress Eyes: EOMI, no icterus Neck: Supple, no JVP Cardiovascular: Regular rhythm, no murmur Pulmonary: Clear to auscultation bilaterally, no wheezing Abdomen: Bowel sounds present, nondistended MSK: No synovitis, joint effusion Neuro: Normal gait, no weakness Psych: Normal mood, full affect   Assessment & Plan:   DOE: Suspect multifactorial, some deconditioning. Concern for asthma given allergy, atopic symptoms. Mild improvement in symptoms with Breo. Wore off. Stop Trelegy due to voice  issues. Start Symbicort top see if HFA and BID dosing more effective. Encouraged ICS/LABA helped in past. Will repeat PFTs.  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 Stable on imaging. Preceded symptoms. Do not think contributing to current DOE.   Return in about 3 months (around 08/04/2021).   Lanier Clam, MD 05/04/2021

## 2021-05-04 NOTE — Patient Instructions (Signed)
Nice to see you again  Stop Trelegy.  Use Symbicort 2 puffs twice a day.  Use with a spacer.  I prescribed both today.  I hope this helps with the breathing, improve the hoarseness.  I also ordered repeat pulmonary function tests to make sure I am not missing something with your shortness of breath.  These will be similar to what Dr. Laqueta Due did in her office but more in-depth.  Return to clinic in 3 months or sooner as needed with Dr. Silas Flood

## 2021-05-04 NOTE — Progress Notes (Signed)
@Patient  ID: Molly Hill, female    DOB: Feb 12, 1951, 70 y.o.   MRN: [id number]  Chief Complaint  Patient presents with   Consult    SOB for 6 months, pulm nodule from radiation    Referring provider: Haydee Salter, NP  HPI:   70 year old whom we are seeing in consultation for evaluation of dyspnea on exertion.  PCP note reviewed.  Most recent cardiology note reviewed.  Dyspnea present for about 6 months.  Although review of records, had a stress test 04/2020 for dyspnea and chest pain.  She denies any chest pain.  Present on hills or inclines worse.  Can be present on flat surfaces or longer distances.  No time of day when things are better or worse.  No seasonal environmental factors identified to make things better or worse.  No position to make things better or worse.  No true alleviating or exacerbating factors otherwise.  In the past, work-up is included TTE that on 10/2020 revealed mild aortic insufficiency and mitral valve regurgitation.  Stress test in 2021 was low risk.  CT chest obtained earlier this month 01/16/2019 on my review and interpretation reveals mild area groundglass/scarring in the right apex, borderline bronchiectasis in the left lower lobe very mild.  CTA PE protocol 07/2019 reviewed which showed similar changes stable right upper lobe fibrosis on my interpretation.  PMH: Hypertension, diabetes Surgical history: Ankle surgery cataract surgery left mastectomy Family history: Mother with CAD, father with CAD, diabetes, CVA Social history: Former smoker, lives in [place] / Pulmonary Flowsheets:   ACT:  No flowsheet data found.  MMRC: No flowsheet data found.  Epworth:  No flowsheet data found.  Tests:   FENO:  No results found for: NITRICOXIDE  PFT: No flowsheet data found.  WALK:  No flowsheet data found.  Imaging: Personally reviewed and as per EMR discussion this note  Lab Results: Personally reviewed and as per EMR  discussion in this note, notably is as high as 300 02/03/2020 and 06/04/2020 CBC    Component Value Date/Time   WBC 8.5 12/27/2020 1507   RBC 3.78 (L) 12/27/2020 1507   HGB 11.6 (L) 12/27/2020 1507   HCT 35.3 (L) 12/27/2020 1507   PLT 223 12/27/2020 1507   MCV 93.4 12/27/2020 1507   MCH 30.7 12/27/2020 1507   MCHC 32.9 12/27/2020 1507   RDW 13.2 12/27/2020 1507   LYMPHSABS 1.8 12/27/2020 1507   MONOABS 0.6 12/27/2020 1507   EOSABS 0.2 12/27/2020 1507   BASOSABS 0.0 12/27/2020 1507    BMET    Component Value Date/Time   NA 132 (L) 12/27/2020 1507   NA 136 07/29/2019 1110   K 4.4 12/27/2020 1507   CL 98 12/27/2020 1507   CO2 25 12/27/2020 1507   GLUCOSE 312 (H) 12/27/2020 1507   BUN 32 (H) 12/27/2020 1507   BUN 21 07/29/2019 1110   CREATININE 1.29 (H) 12/27/2020 1507   CALCIUM 9.0 12/27/2020 1507   GFRNONAA 45 (L) 12/27/2020 1507   GFRAA 58 (L) 05/24/2020 1334    BNP No results found for: BNP  ProBNP No results found for: PROBNP  Specialty Problems   None   Allergies  Allergen Reactions   Codeine     Hyper with vomiting   Reglan [Metoclopramide]     "pulls muscle to side" "feel paralyzed"    Immunization History  Administered Date(s) Administered   PFIZER(Purple Top)SARS-COV-2 Vaccination 11/06/2019, 11/27/2019    Past Medical History:  Diagnosis  Date   Alopecia    Anemia    Bicuspid aortic valve    mild to moderate AS by echo 2022   Carotid stenosis    1-39% bilateral carotid stenosis by dopplers 07/2020   Convalescence after chemotherapy    Coronary arteriosclerosis in native artery 2013   s/p PCI of LAD   DM (diabetes mellitus), type 1 (HCC)    Estrogen receptor negative    History of breast cancer    Hyperglycemia    Hyperlipidemia    Hypertension    Mild dehydration    Mitral regurgitation    mild to moderate by echo 2020   Murmur, heart    Swelling of joint of both wrists    Tendinitis of left wrist     Tobacco History: Social  History   Tobacco Use  Smoking Status Former  Smokeless Tobacco Never   Counseling given: Yes   Continue to not smoke  Outpatient Encounter Medications as of 01/20/2021  Medication Sig   amLODipine (NORVASC) 2.5 MG tablet TAKE 1 TABLET EVERY DAY   aspirin EC 81 MG tablet Take 81 mg by mouth daily.   atorvastatin (LIPITOR) 40 MG tablet Take 40 mg by mouth daily.   Biotin w/ Vitamins C & E (HAIR/SKIN/NAILS PO) Take by mouth.   Calcium Carb-Cholecalciferol (CALCIUM 600 + D PO) Take by mouth.   celecoxib (CELEBREX) 200 MG capsule Take 200 mg by mouth 2 (two) times daily.   docusate sodium (COLACE) 100 MG capsule Take 100 mg by mouth daily as needed for mild constipation.   Efinaconazole 10 % SOLN Apply 1 drop topically daily.   fexofenadine (ALLEGRA) 180 MG tablet Take 180 mg by mouth as needed.    fluticasone (FLONASE) 50 MCG/ACT nasal spray Place into both nostrils as needed.    fluticasone furoate-vilanterol (BREO ELLIPTA) 100-25 MCG/INH AEPB Inhale 1 puff into the lungs daily.   glucose blood test strip 1 each by Other route as needed for other. Use as instructed   insulin NPH Human (NOVOLIN N) 100 UNIT/ML injection Inject into the skin.   insulin regular (NOVOLIN R) 100 units/mL injection Inject 100 Units into the skin 3 (three) times daily before meals.   loperamide (IMODIUM) 1 MG/5ML solution Take by mouth as needed for diarrhea or loose stools.   Metoprolol Succinate 25 MG CS24 Take 25 mg by mouth.   nitroGLYCERIN (NITROSTAT) 0.4 MG SL tablet Place 0.4 mg under the tongue every 5 (five) minutes as needed for chest pain.   NON FORMULARY [facility]  Antifungal (nail)-#1/no urea 10%   ramipril (ALTACE) 10 MG capsule Take 10 mg by mouth daily.   No facility-administered encounter medications on file as of 01/20/2021.     Review of Systems  Review of Systems  No chest pain with exertion.  No orthopnea or PND.  No lower extremity swelling.  Comprehensive review of systems  otherwise negative. Physical Exam  BP 118/60 (BP Location: Left Arm, Cuff Size: Normal)   Pulse 75   Temp (!) 97.3 F (36.3 C) (Oral)   Ht 5' (1.524 m)   Wt 161 lb 9.6 oz (73.3 kg)   SpO2 97%   BMI 31.56 kg/m   Wt Readings from Last 5 Encounters:  01/20/21 161 lb 9.6 oz (73.3 kg)  12/27/20 160 lb (72.6 kg)  11/24/20 162 lb 2 oz (73.5 kg)  07/06/20 151 lb 6.4 oz (68.7 kg)  05/24/20 146 lb (66.2 kg)    BMI Readings  from Last 5 Encounters:  01/20/21 31.56 kg/m  12/27/20 31.25 kg/m  11/24/20 31.66 kg/m  07/06/20 29.57 kg/m  05/24/20 28.51 kg/m     Physical Exam General: Sitting in chair, no distress Eyes: EOMI, no icterus Neck: Supple, no JVP Cardiovascular: Regular rhythm, no murmur Pulmonary: Clear to auscultation bilaterally, no wheezing Abdomen: Bowel sounds present, nondistended MSK: No synovitis, joint effusion Neuro: Normal gait, no weakness Psych: Normal mood, full affect   Assessment & Plan:   DOE: Suspect multifactorial, some deconditioning. Concern for asthma given allergy, atopic symptoms. PFTS done at PCP office, can not view but she reports being told they were normal. Trial breo. Consider repeat PFTs in future.  Notably mild mitral valve regurgitation, aortic insufficiency on echo 10/2020.  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 Preceded symptoms. Do not think contributing to current DOE.    Return in about 3 months (around 04/21/2021).   Lanier Clam, MD 05/04/2021

## 2021-05-09 ENCOUNTER — Telehealth: Payer: Self-pay | Admitting: Pulmonary Disease

## 2021-05-09 NOTE — Telephone Encounter (Signed)
Pt stated that she has only been on the Symbicort inhaler for 3 days and stated that it has her very jumpy and stated that it does not seem to be working for her because of the reaction she is getting and would like to know should she cut back from 2 in am/2 in pm or get something different. Pt also stated that her BP has been running low stated it is back up now but on Saturday it was 106/40 then went to 110/60 and just wanted to mention that as well to Dr. Silas Flood.  **Pt stated if you can call her in afternoon or leave a detailed message she has to have a root canal done at 10 a.m**  Pharmacy; [id number] [place] [address] [id number]  [place]; [phone]

## 2021-05-09 NOTE — Telephone Encounter (Signed)
Dr. Silas Flood, please see message on pt's symptoms and advise if there is anything else that can be recommended for pt.

## 2021-05-10 MED ORDER — ADVAIR HFA 230-21 MCG/ACT IN AERO: 2.0000 | INHALATION_SPRAY | Freq: Two times a day (BID) | RESPIRATORY_TRACT | 12 refills | Status: DC

## 2021-05-10 NOTE — Telephone Encounter (Signed)
Called and spoke with pt letting her know the info stated by Dr. Silas Flood. Pt said that she is willing to give Advair HFA a try to see if it works for her and also to see if she has less side effects. [facility] [place], [place].  Routing back to Dr. Silas Flood.

## 2021-05-10 NOTE — Telephone Encounter (Signed)
Patient states still having diarrhea. Would like something called into pharmacy. Pharmacy is [facility] Patient phone number is [phone].

## 2021-05-10 NOTE — Telephone Encounter (Signed)
Called and spoke with pt to see if she was still having diarrhea and she stated that it has stopped as she is not using the Symbicort now.   Pt stopped using the Symbicort inhaler yesterday morning 7/25. Since stopping the Symbicort, she has not had anymore diarrhea.  Pt states that she cannot take any meds that have steroids in it due to it making her very jittery. Pt said that when she was on the prednisone for 5 days, she did tolerate it but it was hard. States though that it did make her blood sugars go up due to her being a diabetic.

## 2021-05-10 NOTE — Telephone Encounter (Signed)
Called and spoke with pt stating to her that she can take imodium for the diarrhea which is probably the best thing for her to take and that it is avail OTC. Pt stated that is what she has been taking and will continue to take it.

## 2021-05-10 NOTE — Telephone Encounter (Signed)
Advise further recommendations and evaluation for diarrhea be directed to her PCP. Stop Symbicort, removed from medication list. New script for high dose Advair HFA 2 puff BID sent to local pharmacy.

## 2021-05-10 NOTE — Telephone Encounter (Signed)
Can we clarify if diarrhea is present? Not mentioned in initial message. The inhaler would have no effect on blood pressure and would not cause diarrhea.

## 2021-05-10 NOTE — Telephone Encounter (Signed)
She has tolerated inhaled corticosteroids with Breo and Trelegy recently. Again, the inhaler does not cause diarrhea. I am open to trying Advair HFA which has similar medicine to Symbicort to see if it helps her respiratory symptoms and if it has less side effects. If she prefers to not do the inhalers then I understand. Without inhalers I can not help her respiratory symptoms.

## 2021-05-10 NOTE — Telephone Encounter (Signed)
Spoke with pt who stated understanding of OTC and inhaler. Nothing further needed at this time.

## 2021-05-16 ENCOUNTER — Ambulatory Visit (INDEPENDENT_AMBULATORY_CARE_PROVIDER_SITE_OTHER): Payer: Medicare HMO | Admitting: Pulmonary Disease

## 2021-05-16 ENCOUNTER — Other Ambulatory Visit: Payer: Self-pay

## 2021-05-16 DIAGNOSIS — R0609 Other forms of dyspnea: Secondary | ICD-10-CM

## 2021-05-16 DIAGNOSIS — R0600 Dyspnea, unspecified: Secondary | ICD-10-CM

## 2021-05-16 DIAGNOSIS — T7840XD Allergy, unspecified, subsequent encounter: Secondary | ICD-10-CM

## 2021-05-16 LAB — PULMONARY FUNCTION TEST
DL/VA % pred: 76 %
DL/VA: 3.28 ml/min/mmHg/L
DLCO cor % pred: 61 %
DLCO cor: 10.33 ml/min/mmHg
DLCO unc % pred: 61 %
DLCO unc: 10.33 ml/min/mmHg
FEF 25-75 Post: 2.77 L/sec
FEF 25-75 Pre: 3.05 L/sec
FEF2575-%Change-Post: -9 %
FEF2575-%Pred-Post: 165 %
FEF2575-%Pred-Pre: 182 %
FEV1-%Change-Post: -1 %
FEV1-%Pred-Post: 107 %
FEV1-%Pred-Pre: 109 %
FEV1-Post: 2.05 L
FEV1-Pre: 2.07 L
FEV1FVC-%Change-Post: 2 %
FEV1FVC-%Pred-Pre: 115 %
FEV6-%Change-Post: -3 %
FEV6-%Pred-Post: 94 %
FEV6-%Pred-Pre: 98 %
FEV6-Post: 2.28 L
FEV6-Pre: 2.36 L
FEV6FVC-%Pred-Post: 104 %
FEV6FVC-%Pred-Pre: 104 %
FVC-%Change-Post: -3 %
FVC-%Pred-Post: 90 %
FVC-%Pred-Pre: 93 %
FVC-Post: 2.29 L
FVC-Pre: 2.36 L
Post FEV1/FVC ratio: 90 %
Post FEV6/FVC ratio: 100 %
Pre FEV1/FVC ratio: 88 %
Pre FEV6/FVC Ratio: 100 %
RV % pred: 71 %
RV: 1.43 L
TLC % pred: 83 %
TLC: 3.73 L

## 2021-05-16 NOTE — Progress Notes (Signed)
PFT done today. 

## 2021-05-25 ENCOUNTER — Telehealth: Payer: Self-pay | Admitting: Pulmonary Disease

## 2021-05-25 NOTE — Telephone Encounter (Signed)
Spoke with the pt  She is scheduled for rov with Dr Silas Flood 07/29/21  She had PFT's done 05/16/21 and would like those results prior to this appt  Please advise, thank you!

## 2021-05-25 NOTE — Telephone Encounter (Signed)
PFTs normal with exception of reduced DLCO which I suspect is related to prior fibrosis from radiation.

## 2021-05-25 NOTE — Telephone Encounter (Signed)
Pt is calling to obtain her PFT results that was done on 05/16/2021. Pls regard; [phone].

## 2021-05-25 NOTE — Telephone Encounter (Signed)
Call made to patient, confirmed DOB. Made aware of PFT results. Voiced understanding.   Nothing further needed at this time.

## 2021-07-11 ENCOUNTER — Other Ambulatory Visit: Payer: Self-pay | Admitting: Cardiology

## 2021-07-11 ENCOUNTER — Ambulatory Visit: Payer: Medicare HMO | Admitting: Cardiology

## 2021-07-11 ENCOUNTER — Encounter: Payer: Self-pay | Admitting: Cardiology

## 2021-07-11 ENCOUNTER — Other Ambulatory Visit: Payer: Self-pay

## 2021-07-11 VITALS — BP 116/62 | HR 78 | Ht 64.0 in | Wt 147.6 lb

## 2021-07-11 DIAGNOSIS — Q231 Congenital insufficiency of aortic valve: Secondary | ICD-10-CM | POA: Diagnosis not present

## 2021-07-11 DIAGNOSIS — E7800 Pure hypercholesterolemia, unspecified: Secondary | ICD-10-CM | POA: Diagnosis not present

## 2021-07-11 DIAGNOSIS — I340 Nonrheumatic mitral (valve) insufficiency: Secondary | ICD-10-CM

## 2021-07-11 DIAGNOSIS — I10 Essential (primary) hypertension: Secondary | ICD-10-CM

## 2021-07-11 DIAGNOSIS — R0989 Other specified symptoms and signs involving the circulatory and respiratory systems: Secondary | ICD-10-CM

## 2021-07-11 DIAGNOSIS — I2510 Atherosclerotic heart disease of native coronary artery without angina pectoris: Secondary | ICD-10-CM

## 2021-07-11 DIAGNOSIS — I6523 Occlusion and stenosis of bilateral carotid arteries: Secondary | ICD-10-CM

## 2021-07-11 DIAGNOSIS — I350 Nonrheumatic aortic (valve) stenosis: Secondary | ICD-10-CM

## 2021-07-11 LAB — COMPREHENSIVE METABOLIC PANEL
ALT: 16 IU/L (ref 0–32)
AST: 28 IU/L (ref 0–40)
Albumin/Globulin Ratio: 1.8 (ref 1.2–2.2)
Albumin: 4.1 g/dL (ref 3.8–4.8)
Alkaline Phosphatase: 101 IU/L (ref 44–121)
BUN/Creatinine Ratio: 22 (ref 12–28)
BUN: 25 mg/dL (ref 8–27)
Bilirubin Total: 0.5 mg/dL (ref 0.0–1.2)
CO2: 26 mmol/L (ref 20–29)
Calcium: 9.4 mg/dL (ref 8.7–10.3)
Chloride: 98 mmol/L (ref 96–106)
Creatinine, Ser: 1.13 mg/dL — ABNORMAL HIGH (ref 0.57–1.00)
Globulin, Total: 2.3 g/dL (ref 1.5–4.5)
Glucose: 280 mg/dL — ABNORMAL HIGH (ref 70–99)
Potassium: 4.8 mmol/L (ref 3.5–5.2)
Sodium: 138 mmol/L (ref 134–144)
Total Protein: 6.4 g/dL (ref 6.0–8.5)
eGFR: 52 mL/min/{1.73_m2} — ABNORMAL LOW (ref >59)

## 2021-07-11 LAB — LIPID PANEL
Chol/HDL Ratio: 2.1 ratio (ref 0.0–4.4)
Cholesterol, Total: 117 mg/dL (ref 100–199)
HDL: 56 mg/dL (ref >39)
LDL Chol Calc (NIH): 45 mg/dL (ref 0–99)
Triglycerides: 78 mg/dL (ref 0–149)
VLDL Cholesterol Cal: 16 mg/dL (ref 5–40)

## 2021-07-11 MED ORDER — RAMIPRIL 10 MG PO CAPS: 10.0000 mg | ORAL_CAPSULE | Freq: Every day | ORAL | 2 refills | Status: DC

## 2021-07-11 MED ORDER — AMLODIPINE BESYLATE 2.5 MG PO TABS: 2.5000 mg | ORAL_TABLET | Freq: Every day | ORAL | 3 refills | Status: DC

## 2021-07-11 MED ORDER — METOPROLOL SUCCINATE 25 MG PO CS24: 25.0000 mg | EXTENDED_RELEASE_CAPSULE | Freq: Every day | ORAL | 3 refills | Status: DC

## 2021-07-11 NOTE — Patient Instructions (Signed)
Medication Instructions:   Your physician recommends that you continue on your current medications as directed. Please refer to the Current Medication list given to you today.  *If you need a refill on your cardiac medications before your next appointment, please call your pharmacy*   Lab Work:  TODAY--CMET AND LIPIDS  If you have labs (blood work) drawn today and your tests are completely normal, you will receive your results only by: [place] (if you have MyChart) OR A paper copy in the mail If you have any lab test that is abnormal or we need to change your treatment, we will call you to review the results.   Testing/Procedures:  Your physician has requested that you have an echocardiogram. Echocardiography is a painless test that uses sound waves to create images of your heart. It provides your doctor with information about the size and shape of your heart and how well your heart's chambers and valves are working. This procedure takes approximately one hour. There are no restrictions for this procedure.  ECHO SHOULD BE SCHEDULED FOR BEGINNING OF JAN 2023 PER DR. Radford Pax  Your physician has requested that you have a carotid duplex. This test is an ultrasound of the carotid arteries in your neck. It looks at blood flow through these arteries that supply the brain with blood. Allow one hour for this exam. There are no restrictions or special instructions.   Follow-Up: At [facility], you and your health needs are our priority.  As part of our continuing mission to provide you with exceptional heart care, we have created designated Provider Care Teams.  These Care Teams include your primary Cardiologist (physician) and Advanced Practice Providers (APPs -  Physician Assistants and Nurse Practitioners) who all work together to provide you with the care you need, when you need it.  We recommend signing up for the patient portal called "MyChart".  Sign up information is provided on  this After Visit Summary.  MyChart is used to connect with patients for Virtual Visits (Telemedicine).  Patients are able to view lab/test results, encounter notes, upcoming appointments, etc.  Non-urgent messages can be sent to your provider as well.   To learn more about what you can do with MyChart, go to [email or web address].    Your next appointment:   1 year(s)  The format for your next appointment:   In Person  Provider:   Fransico Him, MD

## 2021-07-11 NOTE — Progress Notes (Signed)
Date:  07/11/2021   ID:  Molly Hill, DOB 1951/04/01, MRN [id number]   PCP:  Ernestene Kiel, MD  Cardiologist:  Fransico Him, MD  Electrophysiologist:  None   Chief Complaint:  CAD, HTN, HLD, AS, MR  History of Present Illness:    This is a pleasant 70yo female with a hx of HTN, HLD, ASCAD s/p remote PCI of LAD in 2013 and heart murmur.  2D echo showed normal LVF with EF 60-65% with G2DD, mild LAE, degenerative thickening of the MV with mild to moderate MR and bicuspid AV with mild AR and moderate AS.    She is here today for followup and is doing well.  She denies any chest pain or pressure, SOB, DOE, PND, orthopnea, LE edema, dizziness, palpitations or syncope. She is compliant with her meds and is tolerating meds with no SE.     Prior CV studies:   The following studies were reviewed today: 2D echo 10/2020, carotid dopplers, EKG  Past Medical History:  Diagnosis Date   Alopecia    Anemia    Bicuspid aortic valve    mild to moderate AS by echo 2022   Carotid stenosis    1-39% bilateral carotid stenosis by dopplers 07/2020   Convalescence after chemotherapy    Coronary arteriosclerosis in native artery 2013   s/p PCI of LAD   DM (diabetes mellitus), type 1 (HCC)    Estrogen receptor negative    History of breast cancer    Hyperglycemia    Hyperlipidemia    Hypertension    Mild dehydration    Mitral regurgitation    mild to moderate by echo 2020   Murmur, heart    Swelling of joint of both wrists    Tendinitis of left wrist    Past Surgical History:  Procedure Laterality Date   ANKLE SURGERY Bilateral    CATARACT EXTRACTION, BILATERAL     CHOLECYSTECTOMY     CORONARY STENT INTERVENTION     CTR     ELBOW SURGERY     HAND SURGERY     BOTH THUMBS AND MIDDLE FINGERS   LEFT BREAST MASTECTOMY     MASCULAR  REPAIR     PORT-A-CATH REMOVAL     PORTACATH PLACEMENT     RIGHT MODIFIED MASTECTOMY Right    UPPER LIP SURGERY       Current Meds  Medication Sig    amLODipine (NORVASC) 2.5 MG tablet TAKE 1 TABLET EVERY DAY   aspirin EC 81 MG tablet Take 81 mg by mouth daily.   atorvastatin (LIPITOR) 40 MG tablet Take 40 mg by mouth daily.   Biotin w/ Vitamins C & E (HAIR/SKIN/NAILS PO) Take by mouth.   Calcium Carb-Cholecalciferol (CALCIUM 600 + D PO) Take by mouth.   celecoxib (CELEBREX) 200 MG capsule Take 200 mg by mouth 2 (two) times daily.   docusate sodium (COLACE) 100 MG capsule Take 100 mg by mouth daily as needed for mild constipation.   Efinaconazole 10 % SOLN Apply 1 drop topically daily.   fexofenadine (ALLEGRA) 180 MG tablet Take 180 mg by mouth as needed.    fluticasone (FLONASE) 50 MCG/ACT nasal spray Place into both nostrils as needed.    fluticasone-salmeterol (ADVAIR HFA) 230-21 MCG/ACT inhaler Inhale 2 puffs into the lungs 2 (two) times daily.   glucose blood test strip 1 each by Other route as needed for other. Use as instructed   insulin NPH Human (NOVOLIN N) 100 UNIT/ML injection  Inject into the skin.   insulin regular (NOVOLIN R) 100 units/mL injection Inject 100 Units into the skin 3 (three) times daily before meals.   loperamide (IMODIUM) 1 MG/5ML solution Take by mouth as needed for diarrhea or loose stools.   Metoprolol Succinate 25 MG CS24 Take 25 mg by mouth.   nitroGLYCERIN (NITROSTAT) 0.4 MG SL tablet Place 0.4 mg under the tongue every 5 (five) minutes as needed for chest pain.   NON FORMULARY [facility]  Antifungal (nail)-#1/no urea 10%   ramipril (ALTACE) 10 MG capsule Take 10 mg by mouth daily.     Allergies:   Codeine and Reglan [metoclopramide]   Social History   Tobacco Use   Smoking status: Former   Smokeless tobacco: Never  Substance Use Topics   Alcohol use: Never   Drug use: Never     Family Hx: The patient's family history includes Breast cancer in her maternal aunt; CAD in her father; Colon cancer in her father; Diabetes in her brother, father, and paternal grandfather; Heart attack in  her mother and paternal grandfather; Stroke in her father.  ROS:   Please see the history of present illness.     All other systems reviewed and are negative.   Labs/Other Tests and Data Reviewed:    Recent Labs: 11/24/2020: TSH 3.484 12/27/2020: ALT 16; BUN 32; Creatinine, Ser 1.29; Hemoglobin 11.6; Platelets 223; Potassium 4.4; Sodium 132   Recent Lipid Panel Lab Results  Component Value Date/Time   CHOL 127 05/03/2020 11:16 AM   TRIG 102 05/03/2020 11:16 AM   HDL 60 05/03/2020 11:16 AM   CHOLHDL 2.1 05/03/2020 11:16 AM   LDLCALC 48 05/03/2020 11:16 AM    Wt Readings from Last 3 Encounters:  07/11/21 147 lb 9.6 oz (67 kg)  05/04/21 151 lb 12.8 oz (68.9 kg)  01/20/21 161 lb 9.6 oz (73.3 kg)     Objective:    Vital Signs:  BP 116/62   Pulse 78   Ht 5\' 4"  (1.626 m)   Wt 147 lb 9.6 oz (67 kg)   SpO2 99%   BMI 25.34 kg/m   GEN: Well nourished, well developed in no acute distress HEENT: Normal NECK: No JVD; left carotid bruit LYMPHATICS: No lymphadenopathy CARDIAC:RRR, no rubs, gallops.  2/6 mid peaking SM at RUSB to LLSB RESPIRATORY:  Clear to auscultation without rales, wheezing or rhonchi  ABDOMEN: Soft, non-tender, non-distended MUSCULOSKELETAL:  No edema; No deformity  SKIN: Warm and dry NEUROLOGIC:  Alert and oriented x 3 PSYCHIATRIC:  Normal affect    EKG was performed today and showed NSR with no ST changes  ASSESSMENT & PLAN:    1.  Mitral regurgitation -2D echo 04/2020 showed mild to moderate MR -repeat echo 10/2020 showed normal LVF with mild MR   2.  Bicuspid AV -echo 10/2020 showed mild to moderate AS and mild AR -chest CTA 07/2019 showed no aortic aneurysm with aortic atherosclerosis -repeat 2D echo 10/2021   3.  ASCAD -s/p PCI of LAD in 2013 -she denies any anginal symptoms since I saw her last -no ischemia on nuclear stress test in July 2021 -continue ASA 81mg  daily, statin, BB   4.  Hyperlipidemia -LDL goal is < 70 -Continue prescription  drug management with Atorvastatin 40mg  daily>refilled  -check FLP and ALT   5.  HTN -Bp is adequately controlled on exam today -Continue prescription drug management with Toprol XL 25mg  daily, Amlodipine 2.5mg  daily and Altace 10mg  daily>refill for 1 year -  check BMET today  6. Carotid artery stenosis -carotid dopplers 07/2020 showed 1-39% bilateral stenosis -repeat carotid dopplers given carotid bruit on exam -continue ASA and statin  Followup with me in 1 year   Medication Adjustments/Labs and Tests Ordered: Current medicines are reviewed at length with the patient today.  Concerns regarding medicines are outlined above.  Tests Ordered: Orders Placed This Encounter  Procedures   EKG 12-Lead    Medication Changes: No orders of the defined types were placed in this encounter.   Disposition:  Follow up 6 months  Signed, Fransico Him, MD  07/11/2021 10:12 AM    [place]

## 2021-07-11 NOTE — Addendum Note (Signed)
Addended by: Nuala Alpha on: 07/11/2021 10:43 AM   Modules accepted: Orders

## 2021-07-12 ENCOUNTER — Telehealth: Payer: Self-pay | Admitting: Cardiology

## 2021-07-12 NOTE — Telephone Encounter (Signed)
New Message:     Patient said she received her lab results.She would like for somebody to please go over these with her, she have some questions and concerns.

## 2021-07-12 NOTE — Telephone Encounter (Signed)
The patient has been notified of the result and verbalized understanding.  All questions (if any) were answered. Antonieta Iba, RN 07/12/2021 10:47 AM

## 2021-07-12 NOTE — Telephone Encounter (Signed)
Pharmacy is asking for an drug change, because medication metoprolol sprinkles is not covered and pharmacy is requesting metoprolol succinate XL 25 mg tablet. Would Dr. Radford Pax like to order this medication, so pt's insurance will cover it? Please address

## 2021-07-25 ENCOUNTER — Other Ambulatory Visit: Payer: Self-pay

## 2021-07-25 ENCOUNTER — Encounter: Payer: Self-pay | Admitting: Cardiology

## 2021-07-25 ENCOUNTER — Ambulatory Visit (HOSPITAL_COMMUNITY)
Admission: RE | Admit: 2021-07-25 | Discharge: 2021-07-25 | Disposition: A | Discharge status: Home / Self Care | Payer: Medicare HMO | Source: Ambulatory Visit | Attending: Cardiovascular Disease | Admitting: Cardiovascular Disease

## 2021-07-25 DIAGNOSIS — I6523 Occlusion and stenosis of bilateral carotid arteries: Secondary | ICD-10-CM

## 2021-07-25 DIAGNOSIS — R0989 Other specified symptoms and signs involving the circulatory and respiratory systems: Secondary | ICD-10-CM | POA: Insufficient documentation

## 2021-07-29 ENCOUNTER — Encounter: Payer: Self-pay | Admitting: Adult Health

## 2021-07-29 ENCOUNTER — Ambulatory Visit: Payer: Medicare HMO | Admitting: Adult Health

## 2021-07-29 ENCOUNTER — Other Ambulatory Visit: Payer: Self-pay

## 2021-07-29 ENCOUNTER — Telehealth: Payer: Self-pay | Admitting: Cardiology

## 2021-07-29 ENCOUNTER — Ambulatory Visit: Payer: Medicare HMO | Admitting: Pulmonary Disease

## 2021-07-29 DIAGNOSIS — J4530 Mild persistent asthma, uncomplicated: Secondary | ICD-10-CM | POA: Diagnosis not present

## 2021-07-29 DIAGNOSIS — J309 Allergic rhinitis, unspecified: Secondary | ICD-10-CM | POA: Diagnosis not present

## 2021-07-29 DIAGNOSIS — J45909 Unspecified asthma, uncomplicated: Secondary | ICD-10-CM | POA: Insufficient documentation

## 2021-07-29 NOTE — Telephone Encounter (Signed)
Spoke with pt and was just seen by pulmonary and pulmonary suggested that Ramipril may be the culprit in causing dry irritated throat and thought maybe med could be changed Per pt will do whatever Dr Radford Pax recommends Will forward to Dr Radford Pax for review and recommendations ./cy

## 2021-07-29 NOTE — Assessment & Plan Note (Signed)
Patient is advised to use Allegra daily as needed

## 2021-07-29 NOTE — Telephone Encounter (Signed)
Pt c/o medication issue:  1. Name of Medication:  ramipril (ALTACE) 10 MG capsule  2. How are you currently taking this medication (dosage and times per day)?  1 capsule by mouth daily   3. Are you having a reaction (difficulty breathing--STAT)?  See below  4. What is your medication issue?  Patient states she has been having a sore throat and losing her voice and she assumes this medication may be the cause.

## 2021-07-29 NOTE — Assessment & Plan Note (Signed)
Mild persistent asthma with improved symptom control on Advair.  Patient is encouraged on trigger prevention.  Would use Allegra daily as needed. Also would recommend consideration of changing ACE inhibitor as she has a daily dry cough throat clearing hoarseness that may be secondary to side effects of ACE inhibitor.  Plan  Patient Instructions  Continue on Advair 1 puff Twice daily  , rinse after use.  Allegra 180mg  daily As needed   Activity as tolerated.  Discuss with cardiology that Ramipril that may be aggravating your cough and hoarseness.  Sips of water to soothe throat , try to avoid throat clearing .  NO MINTS .  Follow up with Dr. Silas Flood in 6 months and As needed

## 2021-07-29 NOTE — Progress Notes (Signed)
@Patient  ID: Molly Hill, female    DOB: 11/04/1950, 70 y.o.   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 (previous XRT for right-sided breast cancer) Medical history significant for right breast cancer, estrogen receptor negative September 2016 status post mastectomy new adjunctive chemotherapy (Adriamycin/Taxol and radiation) , previous breast cancer status post left mastectomy questionable date Insulin dependent type I DM, bicuspid aortic valve with mild to moderate aortic stenosis, carotid stenosis, coronary artery disease status post PCI  TEST/EVENTS :  CT chest January 15, 2021 stable scarring in the right upper lobe with groundglass type appearance in this area is stable, stable 3 mm opacity right upper lobe, mild fibrosis in the peripheral right upper lobe posterior segment.  No adenopathy.  PFTs May 16, 2021 showed FEV1 at 109%, ratio 88, FVC 93%, DLCO 61%  07/29/2021 Follow up : Asthma and Dyspnea  Patient returns for a 82-month follow-up.  Patient is being treated for suspected underlying asthma and shortness of breath.  Is suspected to be multifactorial with associated deconditioning.  Patient does have a history of breast cancer with previous radiation and chemotherapy.  CT chest does reveal right upper lobe scarring/groundglass, mild fibrosis.  PFTs have shown no significant obstruction or restriction.  A decreased diffusing capacity.  Patient was placed on Advair last visit.  She does feel like this has helped quite a bit with decreased shortness of breath and improved activity tolerance.  Patient says she does have's daily ongoing tickling dry cough, throat clearing and intermittent hoarseness.  She is on ACE inhibitor.  She says this is mild but has been going  on for a long time.  She does take Allegra on occasion she has postnasal drainage intermittently. Patient got her flu shot and COVID booster this past week. She denies any hemoptysis chest pain orthopnea PND or increased leg swelling.  She is going on a cruise next week.  Allergies  Allergen Reactions   Codeine     Hyper with vomiting   Reglan [Metoclopramide]     "pulls muscle to side" "feel paralyzed"    Immunization History  Administered Date(s) Administered   Fluad Quad(high Dose 65+) 07/27/2021   PFIZER Comirnaty(Gray Top)Covid-19 Tri-Sucrose Vaccine 07/27/2021   PFIZER(Purple Top)SARS-COV-2 Vaccination 11/06/2019, 11/27/2019    Past Medical History:  Diagnosis Date   Alopecia    Anemia    Bicuspid aortic valve    mild to moderate AS by echo 2022   Carotid stenosis    1-39% bilateral carotid stenosis by dopplers 07/2021   Convalescence after chemotherapy    Coronary arteriosclerosis in native artery 2013   s/p PCI of LAD   DM (diabetes mellitus), type 1 (HCC)    Estrogen receptor negative    History of breast cancer    Hyperglycemia    Hyperlipidemia    Hypertension    Mild dehydration    Mitral regurgitation    mild to moderate by echo 2020   Murmur, heart    Swelling of joint of both wrists    Tendinitis of left wrist     Tobacco History: Social History   Tobacco Use  Smoking Status Former   Years: 3.00   Types: Cigarettes   Quit date: 08/15/1979   Years since quitting: 41.9  Smokeless Tobacco Never  Tobacco Comments   1 pack would last a week.   Counseling given: Not Answered Tobacco comments: 1 pack would last a week.   Outpatient Medications Prior to Visit  Medication Sig Dispense Refill   amLODipine (NORVASC) 2.5 MG tablet Take 1 tablet (2.5 mg total) by mouth daily. 90 tablet 3   aspirin EC 81 MG tablet Take 81 mg by mouth daily.     atorvastatin (LIPITOR) 40 MG tablet Take 40 mg by mouth daily.     Biotin w/ Vitamins C & E  (HAIR/SKIN/NAILS PO) Take by mouth.     Calcium Carb-Cholecalciferol (CALCIUM 600 + D PO) Take by mouth.     celecoxib (CELEBREX) 200 MG capsule Take 200 mg by mouth 2 (two) times daily.     docusate sodium (COLACE) 100 MG capsule Take 100 mg by mouth daily as needed for mild constipation.     Efinaconazole 10 % SOLN Apply 1 drop topically daily. 4 mL 11   fexofenadine (ALLEGRA) 180 MG tablet Take 180 mg by mouth as needed.      fluticasone (FLONASE) 50 MCG/ACT nasal spray Place into both nostrils as needed.      fluticasone-salmeterol (ADVAIR HFA) 230-21 MCG/ACT inhaler Inhale 2 puffs into the lungs 2 (two) times daily. 1 each 12   glucose blood test strip 1 each by Other route as needed for other. Use as instructed     insulin NPH Human (NOVOLIN N) 100 UNIT/ML injection Inject into the skin.     insulin regular (NOVOLIN R) 100 units/mL injection Inject 100 Units into the skin 3 (three) times daily before meals.     loperamide (IMODIUM) 1 MG/5ML solution Take by mouth as needed for diarrhea or loose stools.     metoprolol succinate (TOPROL XL) 25 MG 24 hr tablet Take 1 tablet (25 mg total) by mouth daily. 90 tablet 3   nitroGLYCERIN (NITROSTAT) 0.4 MG SL tablet Place 0.4 mg under the tongue every 5 (five) minutes as needed for chest pain.     NON FORMULARY [facility]  Antifungal (nail)-#1/no urea 10%     ramipril (ALTACE) 10 MG capsule Take 1 capsule (10 mg total) by mouth daily. 90 capsule 2   Spacer/Aero-Holding [facility] Use with Symbicort. Wash once weekly with warm soapy water. Do not put in dishwasher. 1 each 1   No facility-administered medications prior to visit.     Review of Systems:   Constitutional:   No  weight loss, night sweats,  Fevers, chills, +fatigue, or  lassitude.  HEENT:   No headaches,  Difficulty swallowing,  Tooth/dental problems, or  Sore throat,                No sneezing, itching, ear ache, +nasal congestion, post nasal drip,   CV:  No chest  pain,  Orthopnea, PND, swelling in lower extremities, anasarca, dizziness, palpitations, syncope.   GI  No heartburn, indigestion, abdominal pain, nausea, vomiting, diarrhea, change in bowel habits, loss of appetite, bloody stools.   Resp: No shortness of breath with exertion or at rest.  No excess mucus, no productive cough,  No non-productive cough,  No coughing up of blood.  No change in color of mucus.  No wheezing.  No chest wall deformity  Skin: no [place]olin or lesions.  GU: no dysuria, change in color of urine, no urgency or frequency.  No flank pain, no hematuria   MS:  No joint pain or swelling.  No decreased range  of motion.  No back pain.    Physical Exam  BP (!) 150/60 (BP Location: Left Arm, Patient Position: Sitting, Cuff Size: Normal)   Pulse 85   Temp 98.4 F (36.9 C) (Oral)   Ht 5\' 1"  (1.549 m)   Wt 148 lb 9.6 oz (67.4 kg)   SpO2 100%   BMI 28.08 kg/m   GEN: A/Ox3; pleasant , NAD, well nourished    HEENT:  [place]/AT,  NOSE-clear, THROAT-clear, no lesions, no postnasal drip or exudate noted.   NECK:  Supple w/ fair ROM; no JVD; normal carotid impulses w/o bruits; no thyromegaly or nodules palpated; no lymphadenopathy.    RESP  Clear  P & A; w/o, wheezes/ rales/ or rhonchi. no accessory muscle use, no dullness to percussion  CARD:  RRR, no m/r/g, no peripheral edema, pulses intact, no cyanosis or clubbing.  GI:   Soft & nt; nml bowel sounds; no organomegaly or masses detected.   Musco: Warm bil, no deformities or joint swelling noted.   Neuro: alert, no focal deficits noted.    Skin: Warm, no lesions or rashes    Lab Results:      BNP No results found for: BNP  ProBNP No results found for: PROBNP  Imaging:     PFT Results Latest Ref Rng & Units 05/16/2021  FVC-Pre L 2.36  FVC-Predicted Pre % 93  FVC-Post L 2.29  FVC-Predicted Post % 90  Pre FEV1/FVC % % 88  Post FEV1/FCV % % 90  FEV1-Pre L 2.07  FEV1-Predicted Pre % 109  FEV1-Post L 2.05   DLCO uncorrected ml/min/mmHg 10.33  DLCO UNC% % 61  DLCO corrected ml/min/mmHg 10.33  DLCO COR %Predicted % 61  DLVA Predicted % 76  TLC L 3.73  TLC % Predicted % 83  RV % Predicted % 71    No results found for: NITRICOXIDE      Assessment & Plan:   Asthma Mild persistent asthma with improved symptom control on Advair.  Patient is encouraged on trigger prevention.  Would use Allegra daily as needed. Also would recommend consideration of changing ACE inhibitor as she has a daily dry cough throat clearing hoarseness that may be secondary to side effects of ACE inhibitor.  Plan  Patient Instructions  Continue on Advair 1 puff Twice daily  , rinse after use.  Allegra 180mg  daily As needed   Activity as tolerated.  Discuss with cardiology that Ramipril that may be aggravating your cough and hoarseness.  Sips of water to soothe throat , try to avoid throat clearing .  NO MINTS .  Follow up with Dr. Silas Flood in 6 months and As needed          Allergic rhinitis Patient is advised to use Allegra daily as needed     Rexene Edison, NP 07/29/2021

## 2021-07-29 NOTE — Patient Instructions (Addendum)
Continue on Advair 1 puff Twice daily  , rinse after use.  Allegra 180mg  daily As needed   Activity as tolerated.  Discuss with cardiology that Ramipril that may be aggravating your cough and hoarseness.  Sips of water to soothe throat , try to avoid throat clearing .  NO MINTS .  Follow up with Dr. Silas Flood in 6 months and As needed

## 2021-08-01 MED ORDER — LOSARTAN POTASSIUM 50 MG PO TABS: 50.0000 mg | ORAL_TABLET | Freq: Every day | ORAL | 3 refills | Status: DC

## 2021-08-01 NOTE — Telephone Encounter (Signed)
Spoke with the patient who states that she is willing to try losartan. However she is about to leave to go on a cruise and she does not want to start a new medication while she is away in case she does not have a good reaction. She will start on losartan and stop ramipril when she returns from her trip. At that time she will start recording her BP daily for a week and call us with her readings.

## 2021-08-19 ENCOUNTER — Other Ambulatory Visit: Payer: Self-pay | Admitting: Cardiology

## 2021-10-07 ENCOUNTER — Telehealth: Payer: Self-pay | Admitting: Cardiology

## 2021-10-07 NOTE — Telephone Encounter (Signed)
Spoke with the patient who reports that she has been having some increased shortness of breath. She does have asthma so is not sure if it is related to that along with the cold weather. She states that when she goes on walks she has difficulty breathing. She states that SOB has been going on for about 6 months but recently over the past several weeks has worsened. Patient also reports that her blood pressure has been increasing.  Readings from November include: 106/50, 116/53, 107/55, 135/65 and 125/61.  Readings from the past week include: 145/81, 142/72, 147/75, 135/79, 157/92, 148/96, and 135/79.  Patient reports that she is taking all medications at prescribed.  I have moved patient's echocardiogram next week and advised her to also contact her pulmonologist in regards to SOB.  Will send message to Dr. Radford Pax in regards to blood pressures.

## 2021-10-07 NOTE — Telephone Encounter (Signed)
°  Pt c/o Shortness Of Breath: STAT if SOB developed within the last 24 hours or pt is noticeably SOB on the phone  1. Are you currently SOB (can you hear that pt is SOB on the phone)? No   2. How long have you been experiencing SOB? A month but its getting worst  3. Are you SOB when sitting or when up moving around? moving around  4. Are you currently experiencing any other symptoms?   Pt said her SOB is getting worst, right now she can't lay down anymore and couldn't sleep at night

## 2021-10-09 ENCOUNTER — Emergency Department (HOSPITAL_COMMUNITY): Payer: Medicare HMO

## 2021-10-09 ENCOUNTER — Inpatient Hospital Stay (HOSPITAL_COMMUNITY)
Admission: EM | Admit: 2021-10-09 | Discharge: 2021-10-14 | DRG: 280 | Disposition: A | Discharge status: Home / Self Care | Payer: Medicare HMO | Attending: Cardiovascular Disease | Admitting: Cardiovascular Disease

## 2021-10-09 DIAGNOSIS — A4151 Sepsis due to Escherichia coli [E. coli]: Secondary | ICD-10-CM | POA: Diagnosis not present

## 2021-10-09 DIAGNOSIS — I2102 ST elevation (STEMI) myocardial infarction involving left anterior descending coronary artery: Principal | ICD-10-CM | POA: Diagnosis present

## 2021-10-09 DIAGNOSIS — Z833 Family history of diabetes mellitus: Secondary | ICD-10-CM

## 2021-10-09 DIAGNOSIS — Z79899 Other long term (current) drug therapy: Secondary | ICD-10-CM

## 2021-10-09 DIAGNOSIS — Z1629 Resistance to other single specified antibiotic: Secondary | ICD-10-CM | POA: Diagnosis present

## 2021-10-09 DIAGNOSIS — I083 Combined rheumatic disorders of mitral, aortic and tricuspid valves: Secondary | ICD-10-CM | POA: Diagnosis present

## 2021-10-09 DIAGNOSIS — Z7982 Long term (current) use of aspirin: Secondary | ICD-10-CM

## 2021-10-09 DIAGNOSIS — I219 Acute myocardial infarction, unspecified: Secondary | ICD-10-CM

## 2021-10-09 DIAGNOSIS — I213 ST elevation (STEMI) myocardial infarction of unspecified site: Secondary | ICD-10-CM | POA: Diagnosis present

## 2021-10-09 DIAGNOSIS — Z955 Presence of coronary angioplasty implant and graft: Secondary | ICD-10-CM

## 2021-10-09 DIAGNOSIS — I25110 Atherosclerotic heart disease of native coronary artery with unstable angina pectoris: Secondary | ICD-10-CM | POA: Diagnosis present

## 2021-10-09 DIAGNOSIS — E785 Hyperlipidemia, unspecified: Secondary | ICD-10-CM | POA: Diagnosis present

## 2021-10-09 DIAGNOSIS — Z803 Family history of malignant neoplasm of breast: Secondary | ICD-10-CM

## 2021-10-09 DIAGNOSIS — Z8249 Family history of ischemic heart disease and other diseases of the circulatory system: Secondary | ICD-10-CM

## 2021-10-09 DIAGNOSIS — N12 Tubulo-interstitial nephritis, not specified as acute or chronic: Secondary | ICD-10-CM | POA: Diagnosis present

## 2021-10-09 DIAGNOSIS — R0902 Hypoxemia: Secondary | ICD-10-CM

## 2021-10-09 DIAGNOSIS — Z794 Long term (current) use of insulin: Secondary | ICD-10-CM

## 2021-10-09 DIAGNOSIS — Z885 Allergy status to narcotic agent status: Secondary | ICD-10-CM

## 2021-10-09 DIAGNOSIS — K5900 Constipation, unspecified: Secondary | ICD-10-CM | POA: Diagnosis present

## 2021-10-09 DIAGNOSIS — E119 Type 2 diabetes mellitus without complications: Secondary | ICD-10-CM

## 2021-10-09 DIAGNOSIS — D649 Anemia, unspecified: Secondary | ICD-10-CM | POA: Diagnosis present

## 2021-10-09 DIAGNOSIS — I5033 Acute on chronic diastolic (congestive) heart failure: Secondary | ICD-10-CM | POA: Diagnosis present

## 2021-10-09 DIAGNOSIS — Z20822 Contact with and (suspected) exposure to covid-19: Secondary | ICD-10-CM | POA: Diagnosis present

## 2021-10-09 DIAGNOSIS — Z9013 Acquired absence of bilateral breasts and nipples: Secondary | ICD-10-CM

## 2021-10-09 DIAGNOSIS — E109 Type 1 diabetes mellitus without complications: Secondary | ICD-10-CM | POA: Diagnosis present

## 2021-10-09 DIAGNOSIS — Z791 Long term (current) use of non-steroidal anti-inflammatories (NSAID): Secondary | ICD-10-CM

## 2021-10-09 DIAGNOSIS — I200 Unstable angina: Secondary | ICD-10-CM | POA: Diagnosis not present

## 2021-10-09 DIAGNOSIS — Z91018 Allergy to other foods: Secondary | ICD-10-CM

## 2021-10-09 DIAGNOSIS — J9601 Acute respiratory failure with hypoxia: Secondary | ICD-10-CM | POA: Diagnosis present

## 2021-10-09 DIAGNOSIS — C50811 Malignant neoplasm of overlapping sites of right female breast: Secondary | ICD-10-CM

## 2021-10-09 DIAGNOSIS — I110 Hypertensive heart disease with heart failure: Secondary | ICD-10-CM | POA: Diagnosis present

## 2021-10-09 DIAGNOSIS — Z171 Estrogen receptor negative status [ER-]: Secondary | ICD-10-CM

## 2021-10-09 DIAGNOSIS — R7881 Bacteremia: Secondary | ICD-10-CM

## 2021-10-09 DIAGNOSIS — B9620 Unspecified Escherichia coli [E. coli] as the cause of diseases classified elsewhere: Secondary | ICD-10-CM

## 2021-10-09 DIAGNOSIS — Z87891 Personal history of nicotine dependence: Secondary | ICD-10-CM

## 2021-10-09 DIAGNOSIS — Z923 Personal history of irradiation: Secondary | ICD-10-CM

## 2021-10-09 HISTORY — DX: Acute myocardial infarction, unspecified: I21.9

## 2021-10-09 LAB — BASIC METABOLIC PANEL
Anion gap: 8 (ref 5–15)
BUN: 31 mg/dL — ABNORMAL HIGH (ref 8–23)
CO2: 26 mmol/L (ref 22–32)
Calcium: 8.7 mg/dL — ABNORMAL LOW (ref 8.9–10.3)
Chloride: 97 mmol/L — ABNORMAL LOW (ref 98–111)
Creatinine, Ser: 1.45 mg/dL — ABNORMAL HIGH (ref 0.44–1.00)
GFR, Estimated: 39 mL/min — ABNORMAL LOW (ref >60)
Glucose, Bld: 521 mg/dL (ref 70–99)
Potassium: 4.3 mmol/L (ref 3.5–5.1)
Sodium: 131 mmol/L — ABNORMAL LOW (ref 135–145)

## 2021-10-09 LAB — TROPONIN I (HIGH SENSITIVITY)
Troponin I (High Sensitivity): 15 ng/L (ref <18)
Troponin I (High Sensitivity): 45 ng/L — ABNORMAL HIGH (ref <18)

## 2021-10-09 LAB — CBC
HCT: 33.4 % — ABNORMAL LOW (ref 36.0–46.0)
Hemoglobin: 10.9 g/dL — ABNORMAL LOW (ref 12.0–15.0)
MCH: 31.6 pg (ref 26.0–34.0)
MCHC: 32.6 g/dL (ref 30.0–36.0)
MCV: 96.8 fL (ref 80.0–100.0)
Platelets: 184 10*3/uL (ref 150–400)
RBC: 3.45 MIL/uL — ABNORMAL LOW (ref 3.87–5.11)
RDW: 13.9 % (ref 11.5–15.5)
WBC: 9.3 10*3/uL (ref 4.0–10.5)
nRBC: 0 % (ref 0.0–0.2)

## 2021-10-09 LAB — HEMOGLOBIN A1C
Hgb A1c MFr Bld: 7.8 % — ABNORMAL HIGH (ref 4.8–5.6)
Mean Plasma Glucose: 177.16 mg/dL

## 2021-10-09 LAB — BRAIN NATRIURETIC PEPTIDE: B Natriuretic Peptide: 511.4 pg/mL — ABNORMAL HIGH (ref 0.0–100.0)

## 2021-10-09 LAB — CBG MONITORING, ED: Glucose-Capillary: 177 mg/dL — ABNORMAL HIGH (ref 70–99)

## 2021-10-09 MED ORDER — HEPARIN SODIUM (PORCINE) 5000 UNIT/ML IJ SOLN
4000.0000 [IU] | Freq: Once | INTRAMUSCULAR | Status: AC
  Administered 2021-10-09: 23:00:00 4000 [IU] via INTRAVENOUS
  Filled 2021-10-09: qty 1

## 2021-10-09 MED ORDER — SODIUM CHLORIDE 0.9 % IV BOLUS
1000.0000 mL | Freq: Once | INTRAVENOUS | Status: AC
  Administered 2021-10-09: 22:00:00 1000 mL via INTRAVENOUS

## 2021-10-09 MED ORDER — NITROGLYCERIN 0.4 MG SL SUBL
0.4000 mg | SUBLINGUAL_TABLET | SUBLINGUAL | Status: DC | PRN
  Administered 2021-10-09 (×2): 0.4 mg via SUBLINGUAL
  Filled 2021-10-09: qty 1

## 2021-10-09 MED ORDER — MORPHINE SULFATE (PF) 4 MG/ML IV SOLN
4.0000 mg | Freq: Once | INTRAVENOUS | Status: AC
  Administered 2021-10-09: 23:00:00 4 mg via INTRAVENOUS
  Filled 2021-10-09: qty 1

## 2021-10-09 MED ORDER — INSULIN ASPART 100 UNIT/ML IJ SOLN
15.0000 [IU] | Freq: Once | INTRAMUSCULAR | Status: AC
  Administered 2021-10-09: 22:00:00 15 [IU] via SUBCUTANEOUS

## 2021-10-09 MED ORDER — ASPIRIN 325 MG PO TABS
325.0000 mg | ORAL_TABLET | Freq: Every day | ORAL | Status: DC
  Administered 2021-10-09: 23:00:00 325 mg via ORAL
  Filled 2021-10-09: qty 1

## 2021-10-09 NOTE — ED Notes (Signed)
Lab called, glucose 521, PA in triage made aware.

## 2021-10-09 NOTE — ED Notes (Signed)
Pharm tech at bedside 

## 2021-10-09 NOTE — ED Provider Notes (Signed)
[facility] Provider Note   CSN: [id number] Arrival date & time: 10/09/21  1833     History Chief Complaint  Patient presents with   Chest Pain    [facility] is a 70 y.o. female.  With past medical history of bicuspid aortic valve, carotid artery stenosis, CAD with PCI of LAD in 2013, mitral regurgitation, hypertension, diabetes who presents to the emergency department with chest pain.  She states that over the past 2 weeks her blood pressure has been elevated.  She states that would her blood pressure is high she begins to have chest pain that she describes as pressure in the center of her chest that goes under both armpits and down both her wrists.  She endorses shortness of breath when she is having elevated blood pressures.  Additionally she states that she has had increasing dyspnea on exertion particularly over the past 2 days.  She states that she has breathing problems for "a while" and was diagnosed with asthma and provided with an Advair inhaler.  However she states over the past 2 days when she is walking across the house she becomes significantly short of breath having to stop.  She endorses intermittent palpitations, lightheadedness and dizziness.  She denies loss of consciousness, lower extremity swelling.  She states last episode of chest pain was around 6 PM.  Additionally she states that her blood sugars have been elevated.  She states that she forgot her insulin this afternoon and her blood sugar was 560.  She also states that she supposed to have an echo on Wednesday.   Chest Pain Associated symptoms: dizziness, fatigue, palpitations and shortness of breath   Associated symptoms: no abdominal pain, no cough, no diaphoresis, no fever, no headache, no nausea and no vomiting       Past Medical History:  Diagnosis Date   Alopecia    Anemia    Bicuspid aortic valve    mild to moderate AS by echo 2022   Carotid stenosis    1-39%  bilateral carotid stenosis by dopplers 07/2021   Convalescence after chemotherapy    Coronary arteriosclerosis in native artery 2013   s/p PCI of LAD   DM (diabetes mellitus), type 1 (HCC)    Estrogen receptor negative    History of breast cancer    Hyperglycemia    Hyperlipidemia    Hypertension    Mild dehydration    Mitral regurgitation    mild to moderate by echo 2020   Murmur, heart    Swelling of joint of both wrists    Tendinitis of left wrist     Patient Active Problem List   Diagnosis Date Noted   Asthma 07/29/2021   Allergic rhinitis 07/29/2021   Abdominal distention 11/24/2020   Onychomycosis 05/31/2020   Carcinoma of overlapping sites of right breast in female, estrogen receptor negative ([facility]) 12/24/2019   Carotid stenosis    Mitral regurgitation    Bicuspid aortic valve    Carotid bruit    Coronary arteriosclerosis in native artery 2013    Past Surgical History:  Procedure Laterality Date   ANKLE SURGERY Bilateral    CATARACT EXTRACTION, BILATERAL     CHOLECYSTECTOMY     CORONARY STENT INTERVENTION     CTR     ELBOW SURGERY     HAND SURGERY     BOTH THUMBS AND MIDDLE FINGERS   LEFT BREAST MASTECTOMY     MASCULAR  REPAIR  PORT-A-CATH REMOVAL     PORTACATH PLACEMENT     RIGHT MODIFIED MASTECTOMY Right    UPPER LIP SURGERY       OB History   No obstetric history on file.     Family History  Problem Relation Age of Onset   Heart attack Mother    CAD Father        CABG   Diabetes Father    Stroke Father    Colon cancer Father        63s   Diabetes Brother    Diabetes Paternal Grandfather    Heart attack Paternal Grandfather    Breast cancer Maternal Aunt        40s    Social History   Tobacco Use   Smoking status: Former    Years: 3.00    Types: Cigarettes    Quit date: 08/15/1979    Years since quitting: 42.1   Smokeless tobacco: Never   Tobacco comments:    1 pack would last a week.  Substance Use Topics   Alcohol use:  Never   Drug use: Never    Home Medications Prior to Admission medications   Medication Sig Start Date End Date Taking? Authorizing Provider  amLODipine (NORVASC) 2.5 MG tablet TAKE 1 TABLET EVERY DAY 08/19/21   Sueanne Margarita, MD  aspirin EC 81 MG tablet Take 81 mg by mouth daily.    [provider]  atorvastatin (LIPITOR) 40 MG tablet Take 40 mg by mouth daily.    [provider]  Biotin w/ Vitamins C & E (HAIR/SKIN/NAILS PO) Take by mouth.    [provider]  Calcium Carb-Cholecalciferol (CALCIUM 600 + D PO) Take by mouth.    [provider]  celecoxib (CELEBREX) 200 MG capsule Take 200 mg by mouth 2 (two) times daily.    [provider]  docusate sodium (COLACE) 100 MG capsule Take 100 mg by mouth daily as needed for mild constipation.    [provider]  Efinaconazole 10 % SOLN Apply 1 drop topically daily. 05/25/20   Trula Slade, DPM  fexofenadine (ALLEGRA) 180 MG tablet Take 180 mg by mouth as needed.     [provider]  fluticasone (FLONASE) 50 MCG/ACT nasal spray Place into both nostrils as needed.     [provider]  fluticasone-salmeterol (ADVAIR HFA) 230-21 MCG/ACT inhaler Inhale 2 puffs into the lungs 2 (two) times daily. 05/10/21   Hunsucker, Bonna Gains, MD  glucose blood test strip 1 each by Other route as needed for other. Use as instructed    [provider]  insulin NPH Human (NOVOLIN N) 100 UNIT/ML injection Inject into the skin.    [provider]  insulin regular (NOVOLIN R) 100 units/mL injection Inject 100 Units into the skin 3 (three) times daily before meals.    [provider]  loperamide (IMODIUM) 1 MG/5ML solution Take by mouth as needed for diarrhea or loose stools.    [provider]  losartan (COZAAR) 50 MG tablet Take 1 tablet (50 mg total) by mouth daily. 08/01/21   Sueanne Margarita, MD  metoprolol succinate (TOPROL XL) 25 MG 24 hr tablet Take 1  tablet (25 mg total) by mouth daily. 07/12/21   Sueanne Margarita, MD  nitroGLYCERIN (NITROSTAT) 0.4 MG SL tablet Place 0.4 mg under the tongue every 5 (five) minutes as needed for chest pain.    [provider]  [facility]  Antifungal (nail)-#1/no urea 10%    [provider]  Spacer/Aero-Holding Dorise Bullion Use with Symbicort. Wash once weekly with warm soapy water. Do not put in dishwasher. 05/04/21   Hunsucker, Bonna Gains, MD    Allergies    Codeine and Reglan [metoclopramide]  Review of Systems   Review of Systems  Constitutional:  Positive for fatigue. Negative for diaphoresis and fever.  HENT:  Negative for congestion.   Respiratory:  Positive for shortness of breath. Negative for cough.   Cardiovascular:  Positive for chest pain and palpitations. Negative for leg swelling.  Gastrointestinal:  Negative for abdominal pain, nausea and vomiting.  Neurological:  Positive for dizziness and light-headedness. Negative for syncope and headaches.  All other systems reviewed and are negative.  Physical Exam Updated Vital Signs BP (!) 116/57    Pulse 93    Temp 99.9 F (37.7 C) (Oral)    Resp 19    Ht 5' 1.25" (1.556 m)    Wt 67.1 kg    SpO2 95%    BMI 27.74 kg/m   Physical Exam Vitals and nursing note reviewed.  Constitutional:      General: She is not in acute distress.    Appearance: Normal appearance. She is well-developed. She is ill-appearing. She is not toxic-appearing.  HENT:     Head: Normocephalic and atraumatic.     Mouth/Throat:     Mouth: Mucous membranes are moist.     Pharynx: Oropharynx is clear.  Eyes:     General: No scleral icterus.    Extraocular Movements: Extraocular movements intact.     Pupils: Pupils are equal, round, and reactive to light.  Neck:     Vascular: JVD present.  Cardiovascular:     Rate and Rhythm: Regular rhythm. Tachycardia present.     Pulses: Normal pulses.          Radial pulses are 2+ on the  right side and 2+ on the left side.       Dorsalis pedis pulses are 2+ on the right side and 2+ on the left side.     Heart sounds: Murmur heard.  Systolic murmur is present.  Pulmonary:     Effort: Pulmonary effort is normal. No respiratory distress.     Breath sounds: Normal breath sounds.  Chest:     Chest wall: No tenderness.  Abdominal:     General: Bowel sounds are normal. There is no distension.     Palpations: Abdomen is soft.     Tenderness: There is no abdominal tenderness.  Musculoskeletal:        General: Normal range of motion.     Cervical back: Normal range of motion and neck supple.     Right lower leg: No tenderness. No edema.     Left lower leg: No tenderness. No edema.  Skin:    General: Skin is warm and dry.     Capillary Refill: Capillary refill takes less than 2 seconds.  Neurological:     General: No focal deficit present.     Mental Status: She is alert and oriented to person, place, and time. Mental status is at baseline.  Psychiatric:        Mood and Affect: Mood normal.        Behavior: Behavior normal.    ED Results / Procedures / Treatments   Labs (all labs ordered are listed, but only abnormal results are displayed) Labs Reviewed  BASIC METABOLIC PANEL - Abnormal; Notable for  the following components:      Result Value   Sodium 131 (*)    Chloride 97 (*)    Glucose, Bld 521 (*)    BUN 31 (*)    Creatinine, Ser 1.45 (*)    Calcium 8.7 (*)    GFR, Estimated 39 (*)    All other components within normal limits  CBC - Abnormal; Notable for the following components:   RBC 3.45 (*)    Hemoglobin 10.9 (*)    HCT 33.4 (*)    All other components within normal limits  BRAIN NATRIURETIC PEPTIDE - Abnormal; Notable for the following components:   B Natriuretic Peptide 511.4 (*)    All other components within normal limits  TROPONIN I (HIGH SENSITIVITY) - Abnormal; Notable for the following components:   Troponin I (High Sensitivity) 45 (*)    All  other components within normal limits  RESP PANEL BY RT-PCR (FLU A&B, COVID) ARPGX2  HEMOGLOBIN A1C  PROTIME-INR  APTT  LIPID PANEL  TROPONIN I (HIGH SENSITIVITY)   EKG EKG Interpretation  Date/Time:  Sunday October 09 2021 22:20:14 EST Ventricular Rate:  113 PR Interval:  156 QRS Duration: 77 QT Interval:  323 QTC Calculation: 443 R Axis:   53 Text Interpretation: Sinus tachycardia Anteroseptal infarct, old Repol abnrm, severe global ischemia (LM/MVD) anterior STEMI with reciprocal changes in the inferior and lateral leads dynamic changes appreciated Confirmed by Varney Biles [phone]) on 10/09/2021 10:48:37 PM  Radiology DG Chest 2 View  Result Date: 10/09/2021 CLINICAL DATA:  Chest pain, 1 week of shortness of breath. EXAM: CHEST - 2 VIEW COMPARISON:  Chest CT January 15, 2021 and chest radiograph November 22, 2020. FINDINGS: The heart size and mediastinal contours are within normal limits. Aortic atherosclerosis. Linear opacity in the right apex appears stable likely reflecting post radiation change. No new focal airspace consolidation. No visible pleural effusion or pneumothorax. No acute osseous abnormality. Cholecystectomy clips. IMPRESSION: 1. No active cardiopulmonary disease. 2. Linear opacity in the right apex appears stable likely reflecting post treatment scarring. Electronically Signed   By: Dahlia Bailiff M.D.   On: 10/09/2021 19:10    Procedures .Critical Care Performed by: Mickie [facility], PA-C Authorized by: Mickie [facility], PA-C   Critical care provider statement:    Critical care time (minutes):  40   Critical care time was exclusive of:  Separately billable procedures and treating other patients   Critical care was necessary to treat or prevent imminent or life-threatening deterioration of the following conditions:  Cardiac failure   Critical care was time spent personally by me on the following activities:  Development of treatment plan with patient or  surrogate, discussions with consultants, discussions with primary provider, evaluation of patient's response to treatment, examination of patient, interpretation of cardiac output measurements, obtaining history from patient or surrogate, review of old charts, re-evaluation of patient's condition, pulse oximetry, ordering and review of radiographic studies, ordering and review of laboratory studies and ordering and performing treatments and interventions   I assumed direction of critical care for this patient from another provider in my specialty: no     Medications Ordered in ED Medications  aspirin tablet 325 mg (325 mg Oral Given 10/09/21 2241)  nitroGLYCERIN (NITROSTAT) SL tablet 0.4 mg (0.4 mg Sublingual Given 10/09/21 2238)  sodium chloride 0.9 % bolus 1,000 mL (1,000 mLs Intravenous New Bag/Given 10/09/21 2207)  insulin aspart (novoLOG) injection 15 Units (15 Units Subcutaneous Given 10/09/21 2205)  heparin injection  4,000 Units (4,000 Units Intravenous Given 10/09/21 2238)  morphine 4 MG/ML injection 4 mg (4 mg Intravenous Given 10/09/21 2242)    ED Course  I have reviewed the triage vital signs and the nursing notes.  Pertinent labs & imaging results that were available during my care of the patient were reviewed by me and considered in my medical decision making (see chart for details).    MDM Rules/Calculators/A&P 70 year old female who presents to the emergency department with shortness of breath and chest pain.  Initial EKG at presentation with inferior lateral ST depressions that are not present on previous EKG.  Patient not having chest pain at initial presentation with provider. -She does have hyperglycemia to 521, 1 L bolus time 15 units of NovoLog ordered. -Repeated EKG when patient got back to room at 2151 with no significant changes from previous -Notified at 2219 that patient is having chest pain.  Repeat EKG with worsening ST depressions, ST elevations in aVR and V1.   Aspirin 325 mg, 0.4 mg of sublingual nitro, 4000 units subcutaneous heparin, 4 mg morphine ordered as well as activated code STEMI per Dr. Kathrynn Humble.  2249: Dr. Paticia Stack, with Cardiology agrees to come see patient at bedside   2301: repeat troponin elevated from 15 --> 45 at this time  BNP 511  2302: Dr. Paticia Stack to bedside who agrees to admit and cath lab tonight. Patient verbalizes understanding of plan. She is chest pain free at this time.  Final Clinical Impression(s) / ED Diagnoses Final diagnoses:  Unstable angina [facility])    Rx / DC Orders ED Discharge Orders     None        [facility], PA-C 10/09/21 [place], Ankit, MD 10/09/21 [phone]

## 2021-10-09 NOTE — ED Notes (Signed)
Pt c/o worsening CP of 7 (0-10). SL Nitro given.

## 2021-10-09 NOTE — ED Notes (Signed)
Pt O2 sats 86% RA. Pt placed on 2L [place]. Pt sats were not improving. Placed pt on 6L [facility]. O2 sats 96%

## 2021-10-09 NOTE — H&P (Signed)
Cardiology Admission History and Physical:   Patient ID: Molly Hill; MRN: [id number]; DOB: 1951/04/22   Admission date: 10/09/2021  Primary Care Provider: Ernestene Kiel, MD Primary Cardiologist: Fransico Him, MD    Chief Complaint:  Worsening shortness of breath and chest pain  History of Present Illness:   Molly Hill is a 70 y.o. female with a history of CAD (prior stent to LAD - DES 2.5 x 18 mm, implanted 11/03/2011), mild to moderate AS, DM, HTN, HL, and Breast CA, who was admitted to the hospital with complaints of SOB and CP. She had been noticing exertional shortness of breath for 2 weeks. She also complained of SOB when lying flat and had recently been sleeping propped up with multiple pillows. She can now only walk 50 feet on a level surface before getting dyspneic. She is also experiencing central chest discomfort that radiates across the chest and to both arm pits. Triggered with exertion and relieved with rest. She denied any diaphoresis, nausea or vomiting.  For worsening dyspnea on exertion she saw a pulmonologist and was prescribed Advair with only marginal relief in symptoms. In the ED the vitals were: BP 148/62 mmHg, HR 109 bpm. She had 7/10 CP and the ECG showed worsening ST segment deviations (depressions laterally and elevation in aVR and V1). Pain was relieved with NTG and morphine.   Labs: Na 131, K 4.3, gluc 521, BUN 31, Cr 1.45, BNP 511, hs-Trops 15 and 45  CXR: No acute process.  Echo 11/05/2020 - EF 60-65% with G2DD, mild LAE, degenerative thickening of the MV with mild to moderate MR and bicuspid AV with mild AR and moderate AS (MG 19 mmHg).   Past Medical History:  Diagnosis Date   Alopecia    Anemia    Bicuspid aortic valve    mild to moderate AS by echo 2022   Carotid stenosis    1-39% bilateral carotid stenosis by dopplers 07/2021   Convalescence after chemotherapy    Coronary arteriosclerosis in native artery 2013   s/p PCI of LAD   DM  (diabetes mellitus), type 1 (HCC)    Estrogen receptor negative    History of breast cancer    Hyperglycemia    Hyperlipidemia    Hypertension    Mild dehydration    Mitral regurgitation    mild to moderate by echo 2020   Murmur, heart    Swelling of joint of both wrists    Tendinitis of left wrist     Past Surgical History:  Procedure Laterality Date   ANKLE SURGERY Bilateral    CATARACT EXTRACTION, BILATERAL     CHOLECYSTECTOMY     CORONARY STENT INTERVENTION     CTR     ELBOW SURGERY     HAND SURGERY     BOTH THUMBS AND MIDDLE FINGERS   LEFT BREAST MASTECTOMY     MASCULAR  REPAIR     PORT-A-CATH REMOVAL     PORTACATH PLACEMENT     RIGHT MODIFIED MASTECTOMY Right    UPPER LIP SURGERY       Medications Prior to Admission: Prior to Admission medications   Medication Sig Start Date End Date Taking? Authorizing Provider  amLODipine (NORVASC) 2.5 MG tablet TAKE 1 TABLET EVERY DAY 08/19/21   Sueanne Margarita, MD  aspirin EC 81 MG tablet Take 81 mg by mouth daily.    [provider]  atorvastatin (LIPITOR) 40 MG tablet Take 40 mg by mouth daily.  [provider]  Biotin w/ Vitamins C & E (HAIR/SKIN/NAILS PO) Take by mouth.    [provider]  Calcium Carb-Cholecalciferol (CALCIUM 600 + D PO) Take by mouth.    [provider]  celecoxib (CELEBREX) 200 MG capsule Take 200 mg by mouth 2 (two) times daily.    [provider]  docusate sodium (COLACE) 100 MG capsule Take 100 mg by mouth daily as needed for mild constipation.    [provider]  Efinaconazole 10 % SOLN Apply 1 drop topically daily. 05/25/20   Trula Slade, DPM  fexofenadine (ALLEGRA) 180 MG tablet Take 180 mg by mouth as needed.     [provider]  fluticasone (FLONASE) 50 MCG/ACT nasal spray Place into both nostrils as needed.     [provider]  fluticasone-salmeterol (ADVAIR HFA) 230-21 MCG/ACT inhaler Inhale 2 puffs into the lungs 2  (two) times daily. 05/10/21   Hunsucker, Bonna Gains, MD  glucose blood test strip 1 each by Other route as needed for other. Use as instructed    [provider]  insulin NPH Human (NOVOLIN N) 100 UNIT/ML injection Inject into the skin.    [provider]  insulin regular (NOVOLIN R) 100 units/mL injection Inject 100 Units into the skin 3 (three) times daily before meals.    [provider]  loperamide (IMODIUM) 1 MG/5ML solution Take by mouth as needed for diarrhea or loose stools.    [provider]  losartan (COZAAR) 50 MG tablet Take 1 tablet (50 mg total) by mouth daily. 08/01/21   Sueanne Margarita, MD  metoprolol succinate (TOPROL XL) 25 MG 24 hr tablet Take 1 tablet (25 mg total) by mouth daily. 07/12/21   Sueanne Margarita, MD  nitroGLYCERIN (NITROSTAT) 0.4 MG SL tablet Place 0.4 mg under the tongue every 5 (five) minutes as needed for chest pain.    [provider]  NON [facility]  Antifungal (nail)-#1/no urea 10%    [provider]  Spacer/Aero-Holding Dorise Bullion Use with Symbicort. Wash once weekly with warm soapy water. Do not put in dishwasher. 05/04/21   Hunsucker, Bonna Gains, MD     Allergies:    Allergies  Allergen Reactions   Codeine     Hyper with vomiting   Reglan [Metoclopramide]     "pulls muscle to side" "feel paralyzed"    Social History:   Social History   Socioeconomic History   Marital status: Widowed    Spouse name: Not on file   Number of children: Not on file   Years of education: Not on file   Highest education level: Not on file  Occupational History   Not on file  Tobacco Use   Smoking status: Former    Years: 3.00    Types: Cigarettes    Quit date: 08/15/1979    Years since quitting: 42.1   Smokeless tobacco: Never   Tobacco comments:    1 pack would last a week.  Substance and Sexual Activity   Alcohol use: Never   Drug use: Never   Sexual activity: Never  Other Topics  Concern   Not on file  Social History Narrative   Not on file   Social Determinants of Health   Financial Resource Strain: Not on file  Food Insecurity: Not on file  Transportation Needs: Not on file  Physical Activity: Not on file  Stress: Not on file  Social Connections: Not on file  Intimate  Partner Violence: Not on file     Family History:   The patient's family history includes Breast cancer in her maternal aunt; CAD in her father; Colon cancer in her father; Diabetes in her brother, father, and paternal grandfather; Heart attack in her mother and paternal grandfather; Stroke in her father.     Review of Systems: [y] = yes, [ ]  = no   General: Weight gain [ ] ; Weight loss [ ] ; Anorexia [ ] ; Fatigue [ ] ; Fever [ ] ; Chills [ ] ; Weakness [ ]   Cardiac: Chest pain/pressure [Y]; Resting SOB [Y]; Exertional SOB [Y]; Orthopnea [Y]; Pedal Edema [ ] ; Palpitations [ ] ; Syncope [ ] ; Presyncope [ ] ; Paroxysmal nocturnal dyspnea[ ]   Pulmonary: Cough [ ] ; Wheezing[Y]; Hemoptysis[ ] ; Sputum [ ] ; Snoring [ ]   GI: Vomiting[ ] ; Dysphagia[ ] ; Melena[ ] ; Hematochezia [ ] ; Heartburn[ ] ; Abdominal pain [ ] ; Constipation [ ] ; Diarrhea [ ] ; BRBPR [ ]   GU: Hematuria[ ] ; Dysuria [ ] ; Nocturia[ ]   Vascular: Pain in legs with walking [ ] ; Pain in feet with lying flat [ ] ; Non-healing sores [ ] ; Stroke [ ] ; TIA [ ] ; Slurred speech [ ] ;  Neuro: Headaches[ ] ; Vertigo[ ] ; Seizures[ ] ; Paresthesias[ ] ;Blurred vision [ ] ; Diplopia [ ] ; Vision changes [ ]   Ortho/Skin: Arthritis [ ] ; Joint pain [ ] ; Muscle pain [ ] ; Joint swelling [ ] ; Back Pain [ ] ; Maulding [ ]   Psych: Depression[ ] ; Anxiety[ ]   Heme: Bleeding problems [ ] ; Clotting disorders [ ] ; Anemia [ ]   Endocrine: Diabetes [ ] ; Thyroid dysfunction[ ]      Physical Exam/Data:   Vitals:   10/09/21 2130 10/09/21 2300 10/09/21 2315 10/09/21 2315  BP: (!) 138/54 127/60 (!) 131/53   Pulse: (!) 108 (!) 108 (!) 107   Resp: (!) 27 (!) 25 (!) 21   Temp:       TempSrc:      SpO2: 97% 94% 99%   Weight:    67.1 kg  Height:    5' 1.25" (1.556 m)   No intake or output data in the 24 hours ending 10/09/21 2322 Filed Weights   10/09/21 2315  Weight: 67.1 kg   Body mass index is 27.74 kg/m.  General:  Well nourished, well developed, mild distress HEENT: normal Lymph: no adenopathy Neck: no JVD Endocrine:  No thryomegaly Vascular: No carotid bruits; FA pulses 2+ bilaterally without bruits  Cardiac:  normal S1, S2; RRR; no murmur  Lungs:  mild crackles at bases  Abd: soft, nontender, no hepatomegaly  Ext: trace lower extremity edema Musculoskeletal:  No deformities, BUE and BLE strength normal and equal Skin: warm and dry  Neuro:  CNs 2-12 intact, no focal abnormalities noted Psych:  Normal affect    EKG:  The ECG from 10/09/2021 22:20, that I reviewed personally, showed ST depressions in the lateral and inferior leads with STE in aVR and V1. There were also q waves anteriorly.   Laboratory Data:  Chemistry Recent Labs  Lab 10/09/21 1837  NA 131*  K 4.3  CL 97*  CO2 26  GLUCOSE 521*  BUN 31*  CREATININE 1.45*  CALCIUM 8.7*  GFRNONAA 39*  ANIONGAP 8    No results for input(s): PROT, ALBUMIN, AST, ALT, ALKPHOS, BILITOT in the last 168 hours. Hematology Recent Labs  Lab 10/09/21 1837  WBC 9.3  RBC 3.45*  HGB 10.9*  HCT 33.4*  MCV 96.8  MCH 31.6  MCHC 32.6  RDW 13.9  PLT 184   Cardiac EnzymesNo results for input(s): TROPONINI in the last 168 hours. No results for input(s): TROPIPOC in the last 168 hours.  BNP Recent Labs  Lab 10/09/21 2210  BNP 511.4*    DDimer No results for input(s): DDIMER in the last 168 hours.  Radiology/Studies:  DG Chest 2 View  Result Date: 10/09/2021 CLINICAL DATA:  Chest pain, 1 week of shortness of breath. EXAM: CHEST - 2 VIEW COMPARISON:  Chest CT January 15, 2021 and chest radiograph November 22, 2020. FINDINGS: The heart size and mediastinal contours are within normal limits.  Aortic atherosclerosis. Linear opacity in the right apex appears stable likely reflecting post radiation change. No new focal airspace consolidation. No visible pleural effusion or pneumothorax. No acute osseous abnormality. Cholecystectomy clips. IMPRESSION: 1. No active cardiopulmonary disease. 2. Linear opacity in the right apex appears stable likely reflecting post treatment scarring. Electronically Signed   By: Dahlia Bailiff M.D.   On: 10/09/2021 19:10    Assessment and Plan:   Anterior ST elevation myocardial infarction (likely subacute presentation) The patient presents with worsening chest pain and SOB for the past 1-2 weeks. The ECG in the ED showed ST depressions in the lateral and inferior leads with STE in aVR and V1. There were also q waves anteriorly.   -Trend cardiac enzymes -Serial ECGs -ASA daily -High intensity statins. -IV heparin infusion -Recommend cardiac catheterization to define the coronary anatomy. Keep NPO   Cardiac catheterization was discussed with the patient fully. The patient understands that risks include but are not limited to stroke (1 in 1000), death (1 in 70), kidney failure [usually temporary] (1 in 500), bleeding (1 in 200), allergic reaction [possibly serious] (1 in 200).  The patient understands and is willing to proceed.      Severity of Illness: The appropriate patient status for this patient is INPATIENT. Inpatient status is judged to be reasonable and necessary in order to provide the required intensity of service to ensure the patient's safety. The patient's presenting symptoms, physical exam findings, and initial radiographic and laboratory data in the context of their chronic comorbidities is felt to place them at high risk for further clinical deterioration. Furthermore, it is not anticipated that the patient will be medically stable for discharge from the hospital within 2 midnights of admission.   * I certify that at the point of admission it  is my clinical judgment that the patient will require inpatient hospital care spanning beyond 2 midnights from the point of admission due to high intensity of service, high risk for further deterioration and high frequency of surveillance required.*   For questions or updates, please contact [place] Please consult [email or web address] for contact info under Cardiology/STEMI.    Signed, Meade Maw, MD  10/09/2021 11:22 PM

## 2021-10-09 NOTE — ED Triage Notes (Signed)
Pt from home c/o CP x1wk "armpit to armpit." Echo scheduled Wednesday. No radiation, -N/V  170'Y systolic, hx HTN 174 HR  CBG 576

## 2021-10-10 ENCOUNTER — Encounter (HOSPITAL_COMMUNITY)
Admission: EM | Disposition: A | Discharge status: Home / Self Care | Payer: Self-pay | Source: Home / Self Care | Attending: Cardiovascular Disease

## 2021-10-10 ENCOUNTER — Inpatient Hospital Stay (HOSPITAL_COMMUNITY): Payer: Medicare HMO

## 2021-10-10 ENCOUNTER — Other Ambulatory Visit (HOSPITAL_COMMUNITY): Payer: Medicare HMO

## 2021-10-10 ENCOUNTER — Other Ambulatory Visit: Payer: Self-pay

## 2021-10-10 ENCOUNTER — Encounter (HOSPITAL_COMMUNITY): Payer: Self-pay | Admitting: Cardiovascular Disease

## 2021-10-10 DIAGNOSIS — A4151 Sepsis due to Escherichia coli [E. coli]: Secondary | ICD-10-CM | POA: Diagnosis not present

## 2021-10-10 DIAGNOSIS — I5033 Acute on chronic diastolic (congestive) heart failure: Secondary | ICD-10-CM | POA: Diagnosis not present

## 2021-10-10 DIAGNOSIS — Z923 Personal history of irradiation: Secondary | ICD-10-CM | POA: Diagnosis not present

## 2021-10-10 DIAGNOSIS — I214 Non-ST elevation (NSTEMI) myocardial infarction: Secondary | ICD-10-CM | POA: Diagnosis not present

## 2021-10-10 DIAGNOSIS — I083 Combined rheumatic disorders of mitral, aortic and tricuspid valves: Secondary | ICD-10-CM | POA: Diagnosis not present

## 2021-10-10 DIAGNOSIS — Z791 Long term (current) use of non-steroidal anti-inflammatories (NSAID): Secondary | ICD-10-CM | POA: Diagnosis not present

## 2021-10-10 DIAGNOSIS — I2510 Atherosclerotic heart disease of native coronary artery without angina pectoris: Secondary | ICD-10-CM | POA: Diagnosis not present

## 2021-10-10 DIAGNOSIS — Z9013 Acquired absence of bilateral breasts and nipples: Secondary | ICD-10-CM | POA: Diagnosis not present

## 2021-10-10 DIAGNOSIS — I213 ST elevation (STEMI) myocardial infarction of unspecified site: Secondary | ICD-10-CM | POA: Diagnosis not present

## 2021-10-10 DIAGNOSIS — I2102 ST elevation (STEMI) myocardial infarction involving left anterior descending coronary artery: Secondary | ICD-10-CM | POA: Diagnosis present

## 2021-10-10 DIAGNOSIS — J9601 Acute respiratory failure with hypoxia: Secondary | ICD-10-CM | POA: Diagnosis not present

## 2021-10-10 DIAGNOSIS — Z7982 Long term (current) use of aspirin: Secondary | ICD-10-CM | POA: Diagnosis not present

## 2021-10-10 DIAGNOSIS — I350 Nonrheumatic aortic (valve) stenosis: Secondary | ICD-10-CM | POA: Diagnosis not present

## 2021-10-10 DIAGNOSIS — Z171 Estrogen receptor negative status [ER-]: Secondary | ICD-10-CM | POA: Diagnosis not present

## 2021-10-10 DIAGNOSIS — C50811 Malignant neoplasm of overlapping sites of right female breast: Secondary | ICD-10-CM | POA: Diagnosis not present

## 2021-10-10 DIAGNOSIS — Z1629 Resistance to other single specified antibiotic: Secondary | ICD-10-CM | POA: Diagnosis present

## 2021-10-10 DIAGNOSIS — Z79899 Other long term (current) drug therapy: Secondary | ICD-10-CM | POA: Diagnosis not present

## 2021-10-10 DIAGNOSIS — D649 Anemia, unspecified: Secondary | ICD-10-CM | POA: Diagnosis not present

## 2021-10-10 DIAGNOSIS — E785 Hyperlipidemia, unspecified: Secondary | ICD-10-CM | POA: Diagnosis present

## 2021-10-10 DIAGNOSIS — B9620 Unspecified Escherichia coli [E. coli] as the cause of diseases classified elsewhere: Secondary | ICD-10-CM | POA: Diagnosis not present

## 2021-10-10 DIAGNOSIS — Z955 Presence of coronary angioplasty implant and graft: Secondary | ICD-10-CM | POA: Diagnosis not present

## 2021-10-10 DIAGNOSIS — R7881 Bacteremia: Secondary | ICD-10-CM | POA: Diagnosis not present

## 2021-10-10 DIAGNOSIS — K5900 Constipation, unspecified: Secondary | ICD-10-CM | POA: Diagnosis present

## 2021-10-10 DIAGNOSIS — Z01810 Encounter for preprocedural cardiovascular examination: Secondary | ICD-10-CM | POA: Diagnosis not present

## 2021-10-10 DIAGNOSIS — I200 Unstable angina: Secondary | ICD-10-CM | POA: Diagnosis present

## 2021-10-10 DIAGNOSIS — Z885 Allergy status to narcotic agent status: Secondary | ICD-10-CM | POA: Diagnosis not present

## 2021-10-10 DIAGNOSIS — I110 Hypertensive heart disease with heart failure: Secondary | ICD-10-CM | POA: Diagnosis not present

## 2021-10-10 DIAGNOSIS — Z20822 Contact with and (suspected) exposure to covid-19: Secondary | ICD-10-CM | POA: Diagnosis not present

## 2021-10-10 DIAGNOSIS — E109 Type 1 diabetes mellitus without complications: Secondary | ICD-10-CM | POA: Diagnosis not present

## 2021-10-10 DIAGNOSIS — Z91018 Allergy to other foods: Secondary | ICD-10-CM | POA: Diagnosis not present

## 2021-10-10 DIAGNOSIS — I25110 Atherosclerotic heart disease of native coronary artery with unstable angina pectoris: Secondary | ICD-10-CM | POA: Diagnosis not present

## 2021-10-10 DIAGNOSIS — N12 Tubulo-interstitial nephritis, not specified as acute or chronic: Secondary | ICD-10-CM | POA: Diagnosis not present

## 2021-10-10 HISTORY — PX: CORONARY/GRAFT ACUTE MI REVASCULARIZATION: CATH118305

## 2021-10-10 LAB — ECHOCARDIOGRAM LIMITED
AR max vel: 0.61 cm2
AV Area VTI: 0.54 cm2
AV Area mean vel: 0.52 cm2
AV Mean grad: 40 mmHg
AV Peak grad: 60.2 mmHg
Ao pk vel: 3.88 m/s
Area-P 1/2: 3.95 cm2
Height: 61.25 in
MV VTI: 1.46 cm2
S' Lateral: 3.6 cm
Weight: 2493.84 oz

## 2021-10-10 LAB — CBC
HCT: 27.7 % — ABNORMAL LOW (ref 36.0–46.0)
Hemoglobin: 9 g/dL — ABNORMAL LOW (ref 12.0–15.0)
MCH: 31 pg (ref 26.0–34.0)
MCHC: 32.5 g/dL (ref 30.0–36.0)
MCV: 95.5 fL (ref 80.0–100.0)
Platelets: 164 10*3/uL (ref 150–400)
RBC: 2.9 MIL/uL — ABNORMAL LOW (ref 3.87–5.11)
RDW: 13.8 % (ref 11.5–15.5)
WBC: 8.7 10*3/uL (ref 4.0–10.5)
nRBC: 0 % (ref 0.0–0.2)

## 2021-10-10 LAB — BASIC METABOLIC PANEL
Anion gap: 10 (ref 5–15)
BUN: 29 mg/dL — ABNORMAL HIGH (ref 8–23)
CO2: 24 mmol/L (ref 22–32)
Calcium: 8.2 mg/dL — ABNORMAL LOW (ref 8.9–10.3)
Chloride: 100 mmol/L (ref 98–111)
Creatinine, Ser: 1.14 mg/dL — ABNORMAL HIGH (ref 0.44–1.00)
GFR, Estimated: 51 mL/min — ABNORMAL LOW (ref >60)
Glucose, Bld: 170 mg/dL — ABNORMAL HIGH (ref 70–99)
Potassium: 4 mmol/L (ref 3.5–5.1)
Sodium: 134 mmol/L — ABNORMAL LOW (ref 135–145)

## 2021-10-10 LAB — GLUCOSE, CAPILLARY
Glucose-Capillary: 120 mg/dL — ABNORMAL HIGH (ref 70–99)
Glucose-Capillary: 180 mg/dL — ABNORMAL HIGH (ref 70–99)
Glucose-Capillary: 186 mg/dL — ABNORMAL HIGH (ref 70–99)
Glucose-Capillary: 195 mg/dL — ABNORMAL HIGH (ref 70–99)
Glucose-Capillary: 275 mg/dL — ABNORMAL HIGH (ref 70–99)
Glucose-Capillary: 64 mg/dL — ABNORMAL LOW (ref 70–99)
Glucose-Capillary: 91 mg/dL (ref 70–99)
Glucose-Capillary: 96 mg/dL (ref 70–99)

## 2021-10-10 LAB — LIPID PANEL
Cholesterol: 107 mg/dL (ref 0–200)
Cholesterol: 99 mg/dL (ref 0–200)
HDL: 41 mg/dL (ref >40)
HDL: 39 mg/dL — ABNORMAL LOW (ref >40)
LDL Cholesterol: 48 mg/dL (ref 0–99)
LDL Cholesterol: 36 mg/dL (ref 0–99)
Total CHOL/HDL Ratio: 2.6 RATIO
Total CHOL/HDL Ratio: 2.5 RATIO
Triglycerides: 88 mg/dL (ref <150)
Triglycerides: 122 mg/dL (ref <150)
VLDL: 18 mg/dL (ref 0–40)
VLDL: 24 mg/dL (ref 0–40)

## 2021-10-10 LAB — PROTIME-INR
INR: 1.3 — ABNORMAL HIGH (ref 0.8–1.2)
INR: 1.1 (ref 0.8–1.2)
Prothrombin Time: 16 seconds — ABNORMAL HIGH (ref 11.4–15.2)
Prothrombin Time: 14.1 seconds (ref 11.4–15.2)

## 2021-10-10 LAB — RESP PANEL BY RT-PCR (FLU A&B, COVID) ARPGX2
Influenza A by PCR: NEGATIVE
Influenza B by PCR: NEGATIVE
SARS Coronavirus 2 by RT PCR: NEGATIVE

## 2021-10-10 LAB — TROPONIN I (HIGH SENSITIVITY)
Troponin I (High Sensitivity): 154 ng/L (ref <18)
Troponin I (High Sensitivity): 199 ng/L (ref <18)

## 2021-10-10 LAB — MRSA NEXT GEN BY PCR, NASAL: MRSA by PCR Next Gen: NOT DETECTED

## 2021-10-10 LAB — APTT: aPTT: >200 seconds (ref 24–36)

## 2021-10-10 LAB — HEPARIN LEVEL (UNFRACTIONATED): Heparin Unfractionated: 0.18 IU/mL — ABNORMAL LOW (ref 0.30–0.70)

## 2021-10-10 SURGERY — CORONARY/GRAFT ACUTE MI REVASCULARIZATION
Anesthesia: LOCAL

## 2021-10-10 MED ORDER — FUROSEMIDE 10 MG/ML IJ SOLN
40.0000 mg | Freq: Once | INTRAMUSCULAR | Status: AC
  Administered 2021-10-10: 02:00:00 40 mg via INTRAVENOUS
  Filled 2021-10-10: qty 4

## 2021-10-10 MED ORDER — INSULIN NPH (HUMAN) (ISOPHANE) 100 UNIT/ML ~~LOC~~ SUSP
18.0000 [IU] | Freq: Two times a day (BID) | SUBCUTANEOUS | Status: DC
  Administered 2021-10-10 – 2021-10-12 (×5): 18 [IU] via SUBCUTANEOUS
  Filled 2021-10-10: qty 10

## 2021-10-10 MED ORDER — VERAPAMIL HCL 2.5 MG/ML IV SOLN
INTRAVENOUS | Status: AC
  Filled 2021-10-10: qty 2

## 2021-10-10 MED ORDER — LIDOCAINE HCL (PF) 1 % IJ SOLN
INTRAMUSCULAR | Status: DC | PRN
  Administered 2021-10-10: 2 mL via INTRADERMAL

## 2021-10-10 MED ORDER — HYDRALAZINE HCL 20 MG/ML IJ SOLN: 10.0000 mg | INTRAMUSCULAR | Status: AC | PRN

## 2021-10-10 MED ORDER — IOHEXOL 350 MG/ML SOLN
INTRAVENOUS | Status: DC | PRN
  Administered 2021-10-10: 01:00:00 85 mL via INTRA_ARTERIAL

## 2021-10-10 MED ORDER — VITAMIN B-12 1000 MCG PO TABS
1000.0000 ug | ORAL_TABLET | Freq: Every day | ORAL | Status: DC
  Administered 2021-10-10 – 2021-10-14 (×5): 1000 ug via ORAL
  Filled 2021-10-10 (×5): qty 1

## 2021-10-10 MED ORDER — BIOTIN W/ VITAMINS C & E 1250-7.5-7.5 MCG-MG-UNT PO CHEW: 1.0000 | CHEWABLE_TABLET | Freq: Every day | ORAL | Status: DC

## 2021-10-10 MED ORDER — INSULIN ASPART 100 UNIT/ML IJ SOLN
0.0000 [IU] | Freq: Every day | INTRAMUSCULAR | Status: DC
Start: 2021-10-10 — End: 2021-10-14
  Administered 2021-10-11 – 2021-10-12 (×2): 3 [IU] via SUBCUTANEOUS
  Administered 2021-10-13: 23:00:00 2 [IU] via SUBCUTANEOUS

## 2021-10-10 MED ORDER — INSULIN NPH (HUMAN) (ISOPHANE) 100 UNIT/ML ~~LOC~~ SUSP: 18.0000 [IU] | Freq: Two times a day (BID) | SUBCUTANEOUS | Status: DC

## 2021-10-10 MED ORDER — INSULIN ASPART 100 UNIT/ML IJ SOLN
0.0000 [IU] | Freq: Three times a day (TID) | INTRAMUSCULAR | Status: DC
Start: 2021-10-10 — End: 2021-10-14
  Administered 2021-10-10: 13:00:00 5 [IU] via SUBCUTANEOUS
  Administered 2021-10-10: 17:00:00 2 [IU] via SUBCUTANEOUS
  Administered 2021-10-11 – 2021-10-14 (×8): 3 [IU] via SUBCUTANEOUS

## 2021-10-10 MED ORDER — HEPARIN (PORCINE) IN NACL 1000-0.9 UT/500ML-% IV SOLN
INTRAVENOUS | Status: AC
  Filled 2021-10-10: qty 500

## 2021-10-10 MED ORDER — CHLORHEXIDINE GLUCONATE CLOTH 2 % EX PADS
6.0000 | MEDICATED_PAD | Freq: Every day | CUTANEOUS | Status: DC
  Administered 2021-10-10 – 2021-10-14 (×5): 6 via TOPICAL

## 2021-10-10 MED ORDER — INSULIN DETEMIR 100 UNIT/ML ~~LOC~~ SOLN
14.0000 [IU] | Freq: Two times a day (BID) | SUBCUTANEOUS | Status: DC
  Filled 2021-10-10 (×2): qty 0.14

## 2021-10-10 MED ORDER — ATORVASTATIN CALCIUM 40 MG PO TABS: 40.0000 mg | ORAL_TABLET | Freq: Every day | ORAL | Status: DC

## 2021-10-10 MED ORDER — ORAL CARE MOUTH RINSE
15.0000 mL | Freq: Two times a day (BID) | OROMUCOSAL | Status: DC
  Administered 2021-10-10 – 2021-10-13 (×7): 15 mL via OROMUCOSAL

## 2021-10-10 MED ORDER — MOMETASONE FURO-FORMOTEROL FUM 200-5 MCG/ACT IN AERO
2.0000 | INHALATION_SPRAY | Freq: Two times a day (BID) | RESPIRATORY_TRACT | Status: DC
  Administered 2021-10-10 – 2021-10-14 (×8): 2 via RESPIRATORY_TRACT
  Filled 2021-10-10: qty 8.8

## 2021-10-10 MED ORDER — SODIUM CHLORIDE 0.9% FLUSH
3.0000 mL | Freq: Two times a day (BID) | INTRAVENOUS | Status: DC
  Administered 2021-10-10 – 2021-10-14 (×8): 3 mL via INTRAVENOUS

## 2021-10-10 MED ORDER — INSULIN NPH (HUMAN) (ISOPHANE) 100 UNIT/ML ~~LOC~~ SUSP
18.0000 [IU] | Freq: Two times a day (BID) | SUBCUTANEOUS | Status: DC
  Filled 2021-10-10: qty 10

## 2021-10-10 MED ORDER — B COMPLEX-C PO TABS
1.0000 | ORAL_TABLET | Freq: Every day | ORAL | Status: DC
  Administered 2021-10-10 – 2021-10-14 (×5): 1 via ORAL
  Filled 2021-10-10 (×5): qty 1

## 2021-10-10 MED ORDER — LABETALOL HCL 5 MG/ML IV SOLN: 10.0000 mg | INTRAVENOUS | Status: AC | PRN

## 2021-10-10 MED ORDER — LIDOCAINE HCL (PF) 1 % IJ SOLN
INTRAMUSCULAR | Status: AC
  Filled 2021-10-10: qty 30

## 2021-10-10 MED ORDER — ASPIRIN EC 81 MG PO TBEC: 81.0000 mg | DELAYED_RELEASE_TABLET | Freq: Every day | ORAL | Status: DC

## 2021-10-10 MED ORDER — VERAPAMIL HCL 2.5 MG/ML IV SOLN
INTRA_ARTERIAL | Status: DC | PRN
  Administered 2021-10-10: 01:00:00 10 mL via INTRA_ARTERIAL

## 2021-10-10 MED ORDER — ATORVASTATIN CALCIUM 80 MG PO TABS
80.0000 mg | ORAL_TABLET | Freq: Every day | ORAL | Status: DC
  Administered 2021-10-10 – 2021-10-14 (×5): 80 mg via ORAL
  Filled 2021-10-10 (×6): qty 1

## 2021-10-10 MED ORDER — INSULIN ASPART 100 UNIT/ML IJ SOLN: 0.0000 [IU] | Freq: Every day | INTRAMUSCULAR | Status: DC

## 2021-10-10 MED ORDER — FUROSEMIDE 10 MG/ML IJ SOLN
40.0000 mg | Freq: Once | INTRAMUSCULAR | Status: AC
  Administered 2021-10-11: 01:00:00 40 mg via INTRAVENOUS
  Filled 2021-10-10: qty 4

## 2021-10-10 MED ORDER — SODIUM CHLORIDE 0.9 % IV SOLN
250.0000 mL | INTRAVENOUS | Status: DC | PRN
  Administered 2021-10-11 (×2): 250 mL via INTRAVENOUS

## 2021-10-10 MED ORDER — INSULIN ASPART 100 UNIT/ML IJ SOLN
2.0000 [IU] | Freq: Three times a day (TID) | INTRAMUSCULAR | Status: DC
  Administered 2021-10-10 – 2021-10-14 (×10): 2 [IU] via SUBCUTANEOUS

## 2021-10-10 MED ORDER — ASPIRIN 81 MG PO CHEW
81.0000 mg | CHEWABLE_TABLET | Freq: Every day | ORAL | Status: DC
  Administered 2021-10-10 – 2021-10-14 (×5): 81 mg via ORAL
  Filled 2021-10-10 (×6): qty 1

## 2021-10-10 MED ORDER — ONDANSETRON HCL 4 MG/2ML IJ SOLN: 4.0000 mg | Freq: Four times a day (QID) | INTRAMUSCULAR | Status: DC | PRN

## 2021-10-10 MED ORDER — SODIUM CHLORIDE 0.9% FLUSH: 3.0000 mL | INTRAVENOUS | Status: DC | PRN

## 2021-10-10 MED ORDER — VITAMIN D 25 MCG (1000 UNIT) PO TABS
1000.0000 [IU] | ORAL_TABLET | Freq: Every day | ORAL | Status: DC
  Administered 2021-10-10 – 2021-10-14 (×5): 1000 [IU] via ORAL
  Filled 2021-10-10 (×7): qty 1

## 2021-10-10 MED ORDER — ACETAMINOPHEN 325 MG PO TABS
650.0000 mg | ORAL_TABLET | ORAL | Status: DC | PRN
  Administered 2021-10-10 – 2021-10-14 (×5): 650 mg via ORAL
  Filled 2021-10-10 (×5): qty 2

## 2021-10-10 MED ORDER — OYSTER SHELL CALCIUM/D3 500-5 MG-MCG PO TABS
1.0000 | ORAL_TABLET | Freq: Every day | ORAL | Status: DC
  Administered 2021-10-10 – 2021-10-14 (×5): 1 via ORAL
  Filled 2021-10-10 (×5): qty 1

## 2021-10-10 MED ORDER — HEPARIN SODIUM (PORCINE) 1000 UNIT/ML IJ SOLN
INTRAMUSCULAR | Status: DC | PRN
  Administered 2021-10-10: 4000 [IU] via INTRAVENOUS

## 2021-10-10 MED ORDER — INSULIN ASPART 100 UNIT/ML IJ SOLN
0.0000 [IU] | Freq: Three times a day (TID) | INTRAMUSCULAR | Status: DC
  Administered 2021-10-10: 10:00:00 3 [IU] via SUBCUTANEOUS

## 2021-10-10 MED ORDER — NITROGLYCERIN IN D5W 200-5 MCG/ML-% IV SOLN
0.0000 ug/min | INTRAVENOUS | Status: DC
  Filled 2021-10-10: qty 250

## 2021-10-10 MED ORDER — AMLODIPINE BESYLATE 5 MG PO TABS
2.5000 mg | ORAL_TABLET | Freq: Every day | ORAL | Status: DC
  Administered 2021-10-10 – 2021-10-14 (×5): 2.5 mg via ORAL
  Filled 2021-10-10 (×6): qty 1

## 2021-10-10 MED ORDER — ACETAMINOPHEN 325 MG PO TABS: 650.0000 mg | ORAL_TABLET | ORAL | Status: DC | PRN

## 2021-10-10 MED ORDER — CELECOXIB 200 MG PO CAPS
200.0000 mg | ORAL_CAPSULE | Freq: Every day | ORAL | Status: DC
  Administered 2021-10-10 – 2021-10-11 (×2): 200 mg via ORAL
  Filled 2021-10-10 (×2): qty 1

## 2021-10-10 MED ORDER — CALCIUM CARB-CHOLECALCIFEROL 600-5 MG-MCG PO TABS: 1.0000 | ORAL_TABLET | Freq: Every day | ORAL | Status: DC

## 2021-10-10 MED ORDER — SODIUM CHLORIDE 0.9 % IV SOLN: INTRAVENOUS | Status: AC

## 2021-10-10 MED ORDER — NITROGLYCERIN 0.4 MG SL SUBL
0.4000 mg | SUBLINGUAL_TABLET | SUBLINGUAL | Status: DC | PRN
  Administered 2021-10-10 – 2021-10-11 (×6): 0.4 mg via SUBLINGUAL
  Filled 2021-10-10 (×5): qty 1

## 2021-10-10 MED ORDER — HEPARIN (PORCINE) IN NACL 1000-0.9 UT/500ML-% IV SOLN
INTRAVENOUS | Status: DC | PRN
  Administered 2021-10-10 (×2): 500 mL

## 2021-10-10 MED ORDER — METOPROLOL SUCCINATE ER 25 MG PO TB24
25.0000 mg | ORAL_TABLET | Freq: Every day | ORAL | Status: DC
  Administered 2021-10-10 – 2021-10-14 (×5): 25 mg via ORAL
  Filled 2021-10-10 (×5): qty 1

## 2021-10-10 MED ORDER — HEPARIN (PORCINE) 25000 UT/250ML-% IV SOLN
950.0000 [IU]/h | INTRAVENOUS | Status: DC
  Administered 2021-10-10: 06:00:00 800 [IU]/h via INTRAVENOUS
  Administered 2021-10-11 – 2021-10-13 (×3): 950 [IU]/h via INTRAVENOUS
  Filled 2021-10-10 (×4): qty 250

## 2021-10-10 SURGICAL SUPPLY — 13 items
CATH INFINITI 5 FR JL3.5 (CATHETERS) ×2 IMPLANT
CATH INFINITI JR4 5F (CATHETERS) ×2 IMPLANT
CATH OPTITORQUE TIG 4.0 5F (CATHETERS) ×2 IMPLANT
DEVICE RAD COMP TR BAND LRG (VASCULAR PRODUCTS) ×2 IMPLANT
GLIDESHEATH SLEND A-KIT 6F 22G (SHEATH) ×2 IMPLANT
GUIDEWIRE INQWIRE 1.5J.035X260 (WIRE) IMPLANT
INQWIRE 1.5J .035X260CM (WIRE) ×3
KIT ENCORE 26 ADVANTAGE (KITS) ×2 IMPLANT
KIT HEART LEFT (KITS) ×3 IMPLANT
PACK CARDIAC CATHETERIZATION (CUSTOM PROCEDURE TRAY) ×3 IMPLANT
TRANSDUCER W/STOPCOCK (MISCELLANEOUS) ×3 IMPLANT
TUBING CIL FLEX 10 FLL-RA (TUBING) ×3 IMPLANT
WIRE HI TORQ VERSACORE-J 145CM (WIRE) ×2 IMPLANT

## 2021-10-10 NOTE — ED Notes (Signed)
Pt's brother Shanon Brow would like to be updated when cath is complete. Call at [phone]. Pt's brother will be in his car or 2H waiting room depending on time.

## 2021-10-10 NOTE — Progress Notes (Addendum)
Progress Note  Patient Name: Molly Hill Date of Encounter: 10/10/2021  [facility] Cardiologist: Kirk Ruths, MD   Subjective   No CP or dyspnea  Inpatient Medications    Scheduled Meds:  amLODipine  2.5 mg Oral Daily   aspirin  81 mg Oral Daily   atorvastatin  80 mg Oral Daily   Chlorhexidine Gluconate Cloth  6 each Topical Daily   insulin NPH Human  18 Units Subcutaneous BID AC   mouth rinse  15 mL Mouth Rinse BID   metoprolol succinate  25 mg Oral Daily   sodium chloride flush  3 mL Intravenous Q12H   Continuous Infusions:  sodium chloride     heparin 800 Units/hr (10/10/21 0700)   nitroGLYCERIN Stopped (10/10/21 0200)   PRN Meds: sodium chloride, acetaminophen, nitroGLYCERIN, ondansetron (ZOFRAN) IV, sodium chloride flush   Vital Signs    Vitals:   10/10/21 0645 10/10/21 0700 10/10/21 0715 10/10/21 0800  BP: (!) 106/48 (!) 100/46 (!) 118/51 (!) 115/51  Pulse: 78 78 86 85  Resp: 18 16 18  (!) 23  Temp:    98.6 F (37 C)  TempSrc:    Oral  SpO2: 96% 97% 97% 97%  Weight:      Height:        Intake/Output Summary (Last 24 hours) at 10/10/2021 0904 Last data filed at 10/10/2021 0700 Gross per 24 hour  Intake 1247.33 ml  Output 925 ml  Net 322.33 ml   Last 3 Weights 10/10/2021 10/09/2021 07/29/2021  Weight (lbs) 155 lb 13.8 oz 148 lb 148 lb 9.6 oz  Weight (kg) 70.7 kg 67.132 kg 67.405 kg      Telemetry    NSR - Personally Reviewed  Physical Exam   GEN: No acute distress.   Neck: No JVD Cardiac: RRR, 2/6 systolic murmur left sternal border. Respiratory: Clear to auscultation bilaterally. GI: Soft, nontender, non-distended  MS: No edema, radial cath site with no hematoma. Neuro:  Nonfocal  Psych: Normal affect   Labs    High Sensitivity Troponin:   Recent Labs  Lab 10/09/21 1837 10/09/21 2209  TROPONINIHS 15 45*     Chemistry Recent Labs  Lab 10/09/21 1837 10/10/21 0605  NA 131* 134*  K 4.3 4.0  CL 97* 100  CO2 26 24   GLUCOSE 521* 170*  BUN 31* 29*  CREATININE 1.45* 1.14*  CALCIUM 8.7* 8.2*  GFRNONAA 39* 51*  ANIONGAP 8 10    Lipids  Recent Labs  Lab 10/10/21 0605  CHOL 99  TRIG 122  HDL 39*  LDLCALC 36  CHOLHDL 2.5    Hematology Recent Labs  Lab 10/09/21 1837 10/10/21 0605  WBC 9.3 8.7  RBC 3.45* 2.90*  HGB 10.9* 9.0*  HCT 33.4* 27.7*  MCV 96.8 95.5  MCH 31.6 31.0  MCHC 32.6 32.5  RDW 13.9 13.8  PLT 184 164   BNP Recent Labs  Lab 10/09/21 2210  BNP 511.4*    Radiology    DG Chest 2 View  Result Date: 10/09/2021 CLINICAL DATA:  Chest pain, 1 week of shortness of breath. EXAM: CHEST - 2 VIEW COMPARISON:  Chest CT January 15, 2021 and chest radiograph November 22, 2020. FINDINGS: The heart size and mediastinal contours are within normal limits. Aortic atherosclerosis. Linear opacity in the right apex appears stable likely reflecting post radiation change. No new focal airspace consolidation. No visible pleural effusion or pneumothorax. No acute osseous abnormality. Cholecystectomy clips. IMPRESSION: 1. No active cardiopulmonary disease.  2. Linear opacity in the right apex appears stable likely reflecting post treatment scarring. Electronically Signed   By: Dahlia Bailiff M.D.   On: 10/09/2021 19:10   CARDIAC CATHETERIZATION  Result Date: 10/10/2021 Images from the original result were not included.   Ost RCA lesion is 75% stenosed.   RPAV lesion is 90% stenosed.   Ost LAD to Prox LAD lesion is 40% stenosed.   Previously placed Prox LAD to Mid LAD stent (unknown type) is  widely patent. Molly Hill is a 70 y.o. female  [id number] LOCATION:  FACILITY: [facility] PHYSICIAN: Quay Burow, M.D. 07/24/1951 DATE OF PROCEDURE:  10/10/2021 DATE OF DISCHARGE: CARDIAC CATHETERIZATION History obtained from chart review.  70 year old female with a history of hypertension, hyperlipidemia, diabetes and prior history of LAD stenting in 2013.  She does have mild to moderate aortic stenosis.  She has had  several weeks of chest pain when lying down.  She had worse pain tonight and came to the emergency room where she had a millimeter of ST segment elevation in lead V1 and ST segment depression in the inferior lateral leads.  She was brought to the Cath Lab for urgent cath and potential intervention.   Ms. Sprankle  has a widely patent LAD stent and moderate segmental disease in the proximal LAD.  There is no other disease in the left system.  She does have a 75% true ostial RCA with mild damping of a 5 [place] catheter.  I reviewed her angiograms with Dr. De Nurse who agrees that there is no culprit lesion in the LAD territory.  In addition, her LVEDP was only 8.  We will treat her medically at this time, cycle her enzymes and heparinize her for the time being.  The radial sheath was removed and a TR band was placed on the right wrist to achieve patent hemostasis.  The patient left lab in stable condition. Quay Burow. MD, [facility] 10/10/2021 1:19 AM      Patient Profile     70 y.o. female with past medical history of coronary artery disease status post PCI of the LAD 2013, mild to moderate aortic stenosis, diabetes mellitus, hypertension, hyperlipidemia, breast cancer admitted with chest pain and dyspnea.  Electrocardiogram showed diffuse ST depression.  Cardiac catheterization revealed 75% ostial right coronary artery, 90% RP AV and 40% ostial to proximal LAD; left ventricular end-diastolic pressure 8 mmHg.  Medical therapy recommended.  Assessment & Plan    1 unstable angina-patient presents with classic symptoms (chest pain with exertion relieved with rest).  Troponin increased mildly.  We will recheck.  Cardiac catheterization results noted.  Question if ostial right coronary artery lesion is causing symptoms.  We will ask the interventional team to review (patient may need PCI of RCA).  We will continue aspirin, heparin, beta-blocker and statin.  Continue IV nitroglycerin for now.  2 dyspnea-patient also  complained of dyspnea on exertion.  She received Lasix with some improvement.  BNP was elevated but LVEDP at time of catheterization 8.  We will follow for now.  3 history of aortic stenosis-plan repeat echocardiogram.  4 anemia-hemoglobin decreased to 9 today.  No active bleeding noted.  We will follow for now.  5 hypertension-blood pressure controlled.  Continue present medications.  6 hyperlipidemia-continue statin.  For questions or updates, please contact [place] Please consult [email or web address] for contact info under    Signed, Kirk Ruths, MD  10/10/2021, 9:04 AM   Addendum-I have reviewed the patient's catheterization films with  Dr. Martinique.  He feels the ostial right coronary artery is significant.  He also feels there may be disease in the ostial ramus and worse disease in the proximal LAD.  We will await echocardiogram to assess aortic stenosis.  Best option may be coronary artery bypass and graft.  Alternatively could consider FFR of LAD lesion and if not significant PCI of RCA.  We will asked the interventional team tomorrow to review for final decision. Pt requests fu with me. Kirk Ruths

## 2021-10-10 NOTE — Plan of Care (Signed)

## 2021-10-10 NOTE — Progress Notes (Signed)
Brother david visiting at bedside.

## 2021-10-10 NOTE — Progress Notes (Signed)
ANTICOAGULATION CONSULT NOTE   Pharmacy Consult for Heparin Indication: chest pain/ACS  Allergies  Allergen Reactions   Codeine     Hyper with vomiting   Reglan [Metoclopramide]     "pulls muscle to side" "feel paralyzed"    Patient Measurements: Height: 5' 1.25" (155.6 cm) Weight: 70.7 kg (155 lb 13.8 oz) IBW/kg (Calculated) : 48.38 Heparin Dosing Weight: 63 kg  Vital Signs: Temp: 99.1 F (37.3 C) (12/26 1200) Temp Source: Axillary (12/26 1200) BP: 97/45 (12/26 1500) Pulse Rate: 67 (12/26 1500)  Labs: Recent Labs    10/09/21 1837 10/09/21 2209 10/09/21 2231 10/10/21 0605 10/10/21 1015 10/10/21 1415  HGB 10.9*  --   --  9.0*  --   --   HCT 33.4*  --   --  27.7*  --   --   PLT 184  --   --  164  --   --   APTT  --   --  >200*  --   --   --   LABPROT  --   --  16.0* 14.1  --   --   INR  --   --  1.3* 1.1  --   --   HEPARINUNFRC  --   --   --   --   --  0.18*  CREATININE 1.45*  --   --  1.14*  --   --   TROPONINIHS 15 45*  --  154* 199*  --      Estimated Creatinine Clearance: 40.9 mL/min (A) (by C-G formula based on SCr of 1.14 mg/dL (H)).   Medical History: Past Medical History:  Diagnosis Date   Alopecia    Anemia    Bicuspid aortic valve    mild to moderate AS by echo 2022   Carotid stenosis    1-39% bilateral carotid stenosis by dopplers 07/2021   Convalescence after chemotherapy    Coronary arteriosclerosis in native artery 2013   s/p PCI of LAD   DM (diabetes mellitus), type 1 (HCC)    Estrogen receptor negative    History of breast cancer    Hyperglycemia    Hyperlipidemia    Hypertension    Mild dehydration    Mitral regurgitation    mild to moderate by echo 2020   Murmur, heart    Swelling of joint of both wrists    Tendinitis of left wrist     Medications:  Medications Prior to Admission  Medication Sig Dispense Refill Last Dose   acetaminophen (TYLENOL) 500 MG tablet Take 1,000 mg by mouth every 6 (six) hours as needed for  moderate pain or headache.   Past Month   amLODipine (NORVASC) 2.5 MG tablet TAKE 1 TABLET EVERY DAY (Patient taking differently: Take 2.5 mg by mouth at bedtime.) 90 tablet 2 10/08/2021   aspirin EC 81 MG tablet Take 81 mg by mouth at bedtime.   10/08/2021   atorvastatin (LIPITOR) 40 MG tablet Take 40 mg by mouth at bedtime.   10/08/2021   Biotin w/ Vitamins C & E (HAIR/SKIN/NAILS PO) Take 1 capsule by mouth daily.   10/09/2021   Calcium Carb-Cholecalciferol (CALCIUM 600 + D PO) Take 1 tablet by mouth daily.   10/09/2021   celecoxib (CELEBREX) 200 MG capsule Take 200 mg by mouth daily.   10/09/2021   cholecalciferol (VITAMIN D) 25 MCG (1000 UNIT) tablet Take 1,000 Units by mouth daily.   10/09/2021   fexofenadine (ALLEGRA) 180 MG tablet Take 180 mg  by mouth daily as needed for allergies.   Past Month   fluticasone (FLONASE) 50 MCG/ACT nasal spray Place into both nostrils as needed.    unk   fluticasone-salmeterol (ADVAIR HFA) 230-21 MCG/ACT inhaler Inhale 2 puffs into the lungs 2 (two) times daily. 1 each 12 10/09/2021   glucose blood test strip 1 each by Other route 4 (four) times daily -  before meals and at bedtime. Use as instructed      insulin NPH Human (NOVOLIN N) 100 UNIT/ML injection Inject 18 Units into the skin 2 (two) times daily before a meal.   10/09/2021   insulin regular (NOVOLIN R) 100 units/mL injection Inject 3-5 Units into the skin See admin instructions. Per sliding scale 3 times daily with meals   10/09/2021   loperamide (IMODIUM) 1 MG/5ML solution Take by mouth as needed for diarrhea or loose stools.   unk   losartan (COZAAR) 50 MG tablet Take 1 tablet (50 mg total) by mouth daily. 90 tablet 3 10/09/2021   metoprolol succinate (TOPROL XL) 25 MG 24 hr tablet Take 1 tablet (25 mg total) by mouth daily. (Patient taking differently: Take 25 mg by mouth at bedtime.) 90 tablet 3 10/08/2021 at 2000   nitroGLYCERIN (NITROSTAT) 0.4 MG SL tablet Place 0.4 mg under the tongue every 5  (five) minutes as needed for chest pain.   unk   vitamin B-12 (CYANOCOBALAMIN) 1000 MCG tablet Take 1,000 mcg by mouth daily.   10/09/2021   Efinaconazole 10 % SOLN Apply 1 drop topically daily. (Patient not taking: Reported on 10/09/2021) 4 mL 11 Not Taking    Assessment: 70 y.o. F presents with CP/ STEMI. S/p cath lab 12/26 - no interventions done. Plan to start heparin 4 hours post TR band removal (removed at 0110). No AC PTA. Hgb 10.9 on admission. Noted pt with coughing and pink tinged sputum.  Heparin level below goal at 0.18.  No overt bleeding or complications noted.  Goal of Therapy:  Heparin level 0.3-0.7 units/ml Monitor platelets by anticoagulation protocol: Yes   Plan:  Increase IV heparin to 950 units/hr. Recheck heparin level in 8 hrs. Daily heparin level and CBC.  [place] Crane, Roylene Reason, BCCP Clinical Pharmacist  10/10/2021 3:28 PM   [facility] pharmacy phone numbers are listed on [email or web address]

## 2021-10-10 NOTE — Progress Notes (Signed)
°   10/10/21 0000  Clinical Encounter Type  Visited With Patient and family together  Visit Type Initial;ED  Referral From Nurse  Consult/Referral To Chaplain   Chaplain responded to STEMI page.  Chaplain offered support to pt and family member.  Pt being attended to by medical staff. Chaplain remains available.  Hubert Azure, Chaplain  Pager: [phone]

## 2021-10-10 NOTE — Progress Notes (Signed)
Inpatient Diabetes Program Recommendations  AACE/ADA: New Consensus Statement on Inpatient Glycemic Control (2015)  Target Ranges:  Prepandial:   less than 140 mg/dL      Peak postprandial:   less than 180 mg/dL (1-2 hours)      Critically ill patients:  140 - 180 mg/dL   Lab Results  Component Value Date   GLUCAP 186 (H) 10/10/2021   HGBA1C 7.8 (H) 10/09/2021    Review of Glycemic Control  Diabetes history: DM1 Outpatient Diabetes medications: Novolin NPH 18 units bid, Novolin R 3-5 units tid meal coverage Current orders for Inpatient glycemic control: NPH 18 units bid ac meals, Novolog correction 0-15 units tid, 0-5 units hs  Inpatient Diabetes Program Recommendations:   Received consult regarding diabetes medication management. Please consider while in the hospital: -Change NPH to Levemir 14 units bid (decreased home basal by 20% -Decrease Novolog correction to 0-9 units tid, 0-5 units hs -Add Novolog 2 units tid meal coverage if eats 50% meals  Thank you, Nani Gasser. Sephira Zellman, RN, MSN, CDE  Diabetes Coordinator Inpatient Glycemic Control Team Team Pager [phone] (8am-5pm) 10/10/2021 10:05 AM

## 2021-10-10 NOTE — Progress Notes (Signed)
Lisabeth Pick, PA secure chat and made him aware of troponin of 154, no chest pain. PA acknowledged, no new orders given at this time. Also made PA aware that patient is asking for her vitamins, celebrex, and advair to be ordered along with Novolin N insulin.

## 2021-10-10 NOTE — Progress Notes (Signed)
Progress Note  Patient Name: Molly Hill Date of Encounter: 10/11/2021  [facility] Cardiologist: Kirk Ruths, MD   Subjective   Continues to have intermittent chest pain at rest. No chest pain or SOB this morning.   Echo showed EF 60-65%. severe AS with mean gradient 62mHg, DI 0.21; mild AI, mild MS, mild MR  Inpatient Medications    Scheduled Meds:  amLODipine  2.5 mg Oral Daily   aspirin  81 mg Oral Daily   atorvastatin  80 mg Oral Daily   B-complex with vitamin C  1 tablet Oral Daily   calcium-vitamin D  1 tablet Oral Q breakfast   celecoxib  200 mg Oral Daily   Chlorhexidine Gluconate Cloth  6 each Topical Daily   cholecalciferol  1,000 Units Oral Daily   insulin aspart  0-5 Units Subcutaneous QHS   insulin aspart  0-9 Units Subcutaneous TID WC   insulin aspart  2 Units Subcutaneous TID WC   insulin NPH Human  18 Units Subcutaneous BID AC & HS   mouth rinse  15 mL Mouth Rinse BID   metoprolol succinate  25 mg Oral Daily   mometasone-formoterol  2 puff Inhalation BID   sodium chloride flush  3 mL Intravenous Q12H   vitamin B-12  1,000 mcg Oral Daily   Continuous Infusions:  sodium chloride     heparin 950 Units/hr (10/11/21 0732)   nitroGLYCERIN Stopped (10/10/21 0200)   PRN Meds: sodium chloride, acetaminophen, nitroGLYCERIN, ondansetron (ZOFRAN) IV, sodium chloride flush   Vital Signs    Vitals:   10/11/21 0500 10/11/21 0612 10/11/21 0746 10/11/21 0751  BP: (!) 98/42 (!) 95/46    Pulse: 75 74 88   Resp: _0 Temp:    99.2 F (37.3 C)  TempSrc:    Axillary  SpO2: 91% 94%    Weight:      Height:        Intake/Output Summary (Last 24 hours) at 10/11/2021 0808 Last data filed at 10/11/2021 0600 Gross per 24 hour  Intake 680.12 ml  Output 1400 ml  Net -719.88 ml   Last 3 Weights 10/10/2021 10/09/2021 07/29/2021  Weight (lbs) 155 lb 13.8 oz 148 lb 148 lb 9.6 oz  Weight (kg) 70.7 kg 67.132 kg 67.405 kg      Telemetry    NSR -  Personally Reviewed  ECG    NSR with nonspecific ST-T wave abnormality - Personally Reviewed  Physical Exam   GEN: No acute distress.   Neck: No JVD Cardiac: RRR, 3/6 harsh, late-peaking systolic murmur heard throughout the precordium Respiratory: Crackles at the bases bilaterally GI: Soft, nontender, non-distended  MS: No edema; No deformity. Neuro:  Nonfocal  Psych: Normal affect   Labs    High Sensitivity Troponin:   Recent Labs  Lab 10/09/21 1837 10/09/21 2209 10/10/21 0605 10/10/21 1015 10/11/21 0022  TROPONINIHS 15 45* 154* 199* 199*     Chemistry Recent Labs  Lab 10/09/21 1837 10/10/21 0605 10/11/21 0002 10/11/21 0022  NA 131* 134* 131* 130*  K 4.3 4.0 4.2 4.1  CL 97* 100  --  98  CO2 26 24  --  24  GLUCOSE 521* 170*  --  215*  BUN 31* 29*  --  30*  CREATININE 1.45* 1.14*  --  1.20*  CALCIUM 8.7* 8.2*  --  8.3*  MG  --   --   --  1.6*  GFRNONAA 39* 51*  --  48*  ANIONGAP 8 10  --  8    Lipids  Recent Labs  Lab 10/10/21 0605  CHOL 99  TRIG 122  HDL 39*  LDLCALC 36  CHOLHDL 2.5    Hematology Recent Labs  Lab 10/09/21 1837 10/10/21 0605 10/11/21 0002 10/11/21 0022  WBC 9.3 8.7  --  9.2  RBC 3.45* 2.90*  --  2.83*  HGB 10.9* 9.0* 8.5* 8.9*  HCT 33.4* 27.7* 25.0* 26.5*  MCV 96.8 95.5  --  93.6  MCH 31.6 31.0  --  31.4  MCHC 32.6 32.5  --  33.6  RDW 13.9 13.8  --  13.6  PLT 184 164  --  159   Thyroid No results for input(s): TSH, FREET4 in the last 168 hours.  BNP Recent Labs  Lab 10/09/21 2210  BNP 511.4*    DDimer No results for input(s): DDIMER in the last 168 hours.   Radiology    DG Chest 2 View  Result Date: 10/09/2021 CLINICAL DATA:  Chest pain, 1 week of shortness of breath. EXAM: CHEST - 2 VIEW COMPARISON:  Chest CT January 15, 2021 and chest radiograph November 22, 2020. FINDINGS: The heart size and mediastinal contours are within normal limits. Aortic atherosclerosis. Linear opacity in the right apex appears stable  likely reflecting post radiation change. No new focal airspace consolidation. No visible pleural effusion or pneumothorax. No acute osseous abnormality. Cholecystectomy clips. IMPRESSION: 1. No active cardiopulmonary disease. 2. Linear opacity in the right apex appears stable likely reflecting post treatment scarring. Electronically Signed   By: Dahlia Bailiff M.D.   On: 10/09/2021 19:10   CARDIAC CATHETERIZATION  Result Date: 10/10/2021 Images from the original result were not included.   Ost RCA lesion is 75% stenosed.   RPAV lesion is 90% stenosed.   Ost LAD to Prox LAD lesion is 40% stenosed.   Previously placed Prox LAD to Mid LAD stent (unknown type) is  widely patent. Molly Hill is a 70 y.o. female  [id number] LOCATION:  FACILITY: [facility] PHYSICIAN: Quay Burow, M.D. 1951-07-17 DATE OF PROCEDURE:  10/10/2021 DATE OF DISCHARGE: CARDIAC CATHETERIZATION History obtained from chart review.  70 year old female with a history of hypertension, hyperlipidemia, diabetes and prior history of LAD stenting in 2013.  She does have mild to moderate aortic stenosis.  She has had several weeks of chest pain when lying down.  She had worse pain tonight and came to the emergency room where she had a millimeter of ST segment elevation in lead V1 and ST segment depression in the inferior lateral leads.  She was brought to the Cath Lab for urgent cath and potential intervention.   Ms. Dalpe  has a widely patent LAD stent and moderate segmental disease in the proximal LAD.  There is no other disease in the left system.  She does have a 75% true ostial RCA with mild damping of a 5 [place] catheter.  I reviewed her angiograms with Dr. De Nurse who agrees that there is no culprit lesion in the LAD territory.  In addition, her LVEDP was only 8.  We will treat her medically at this time, cycle her enzymes and heparinize her for the time being.  The radial sheath was removed and a TR band was placed on the right wrist to achieve  patent hemostasis.  The patient left lab in stable condition. Quay Burow. MD, [facility] 10/10/2021 1:19 AM    DG CHEST PORT 1 VIEW  Result Date: 10/10/2021 CLINICAL DATA:  Hypoxia EXAM: PORTABLE CHEST 1 VIEW COMPARISON:  10/09/2021, CT chest 01/15/2021, chest x-ray 12/02/2020 FINDINGS: fibrosis at the right apex. No acute consolidation or pleural effusion. Mild diffuse bronchitic changes. Stable cardiomediastinal silhouette with aortic atherosclerosis. IMPRESSION: No active disease. Mild diffuse bronchitic changes with probable fibrosis at right apex Electronically Signed   By: Donavan Foil M.D.   On: 10/10/2021 23:50   ECHOCARDIOGRAM LIMITED  Result Date: 10/10/2021    ECHOCARDIOGRAM LIMITED REPORT   Patient Name:   Molly Hill Date of Exam: 10/10/2021 Medical Rec #:  [id number]      Height:       61.3 in Accession #:    [id number]     Weight:       155.9 lb Date of Birth:  1951-01-15     BSA:          1.704 m Patient Age:    30 years       BP:           137/56 mmHg Patient Gender: F              HR:           83 bpm. Exam Location:  Inpatient Procedure: Limited Echo, 3D Echo, Cardiac Doppler, Color Doppler and Strain            Analysis Indications:    CAD Native Vessel I25.10  History:        Patient has prior history of Echocardiogram examinations, most                 recent 11/06/2020. CAD, Carotid Disease; Aortic Valve Disease and                 Mitral Valve Disease. Past history of breast cancer.  Sonographer:    Darlina Sicilian RDCS Referring Phys: [place]  1. Normal LV function; calcified aortic valve with severe AS (mean gradient 40 mmHg; DI 0.21); mac with mild MS (mean gradient 5 mmHg) and mild MR.  2. Left ventricular ejection fraction, by estimation, is 60 to 65%. The left ventricle has normal function. The left ventricle has no regional wall motion abnormalities. There is mild left ventricular hypertrophy of the basal-septal segment. Left ventricular diastolic  parameters are consistent with Grade I diastolic dysfunction (impaired relaxation). Elevated left atrial pressure. The average left ventricular global longitudinal strain is -18.4 %. The global longitudinal strain is normal.  3. Right ventricular systolic function is normal. The right ventricular size is normal.  4. The mitral valve is normal in structure. Mild mitral valve regurgitation. Mild mitral stenosis. Moderate mitral annular calcification.  5. The aortic valve is calcified. Aortic valve regurgitation is mild. Severe aortic valve stenosis.  6. The inferior vena cava is normal in size with greater than 50% respiratory variability, suggesting right atrial pressure of 3 mmHg. FINDINGS  Left Ventricle: Left ventricular ejection fraction, by estimation, is 60 to 65%. The left ventricle has normal function. The left ventricle has no regional wall motion abnormalities. The average left ventricular global longitudinal strain is -18.4 %. The global longitudinal strain is normal. The left ventricular internal cavity size was normal in size. There is mild left ventricular hypertrophy of the basal-septal segment. Left ventricular diastolic parameters are consistent with Grade I diastolic dysfunction (impaired relaxation). Elevated left atrial pressure. Right Ventricle: The right ventricular size is normal. Right ventricular systolic function is normal. Left Atrium: Left atrial size was normal in size. Right  Atrium: Right atrial size was normal in size. Pericardium: There is no evidence of pericardial effusion. Mitral Valve: The mitral valve is normal in structure. Moderate mitral annular calcification. Mild mitral valve regurgitation. Mild mitral valve stenosis. MV peak gradient, 8.2 mmHg. The mean mitral valve gradient is 5.0 mmHg. Tricuspid Valve: The tricuspid valve is normal in structure. Tricuspid valve regurgitation is trivial. No evidence of tricuspid stenosis. Aortic Valve: The aortic valve is calcified. Aortic  valve regurgitation is mild. Severe aortic stenosis is present. Aortic valve mean gradient measures 40.0 mmHg. Aortic valve peak gradient measures 60.2 mmHg. Aortic valve area, by VTI measures 0.54 cm. Pulmonic Valve: The pulmonic valve was not well visualized. Pulmonic valve regurgitation is not visualized. No evidence of pulmonic stenosis. Aorta: The aortic root is normal in size and structure. Venous: The inferior vena cava is normal in size with greater than 50% respiratory variability, suggesting right atrial pressure of 3 mmHg. IAS/Shunts: No atrial level shunt detected by color flow Doppler. Additional Comments: Normal LV function; calcified aortic valve with severe AS (mean gradient 40 mmHg; DI 0.21); mac with mild MS (mean gradient 5 mmHg) and mild MR. LEFT VENTRICLE PLAX 2D LVIDd:         4.25 cm   Diastology LVIDs:         3.60 cm   LV e' medial:    4.74 cm/s LV PW:         0.80 cm   LV E/e' medial:  27.3 LV IVS:        1.30 cm   LV e' lateral:   2.95 cm/s LVOT diam:     1.80 cm   LV E/e' lateral: 43.9 LV SV:         52 LV SV Index:   31        2D Longitudinal Strain LVOT Area:     2.54 cm  2D Strain GLS Avg:     -18.4 %                           3D Volume EF:                          3D EF:        60 %                          LV EDV:       116 ml                          LV ESV:       47 ml                          LV SV:        69 ml LEFT ATRIUM         Index LA diam:    4.40 cm 2.58 cm/m  AORTIC VALVE AV Area (Vmax):    0.61 cm AV Area (Vmean):   0.52 cm AV Area (VTI):     0.54 cm AV Vmax:           387.80 cm/s AV Vmean:          303.500 cm/s AV VTI:            0.968 m AV Peak Grad:  60.2 mmHg AV Mean Grad:      40.0 mmHg LVOT Vmax:         92.90 cm/s LVOT Vmean:        61.700 cm/s LVOT VTI:          0.205 m LVOT/AV VTI ratio: 0.21  AORTA Ao Asc diam: 2.50 cm MITRAL VALVE MV Area (PHT): 3.95 cm     SHUNTS MV Area VTI:   1.46 cm     Systemic VTI:  0.20 m MV Peak grad:  8.2 mmHg     Systemic  Diam: 1.80 cm MV Mean grad:  5.0 mmHg MV Vmax:       1.43 m/s MV Vmean:      107.0 cm/s MV Decel Time: 192 msec MV E velocity: 129.50 cm/s MV A velocity: 142.00 cm/s MV E/A ratio:  0.91 Kirk Ruths MD Electronically signed by Kirk Ruths MD Signature Date/Time: 10/10/2021/12:33:40 PM    Final     Cardiac Studies   TTE 10/10/21: IMPRESSIONS     1. Normal LV function; calcified aortic valve with severe AS (mean  gradient 40 mmHg; DI 0.21); mac with mild MS (mean gradient 5 mmHg) and  mild MR.   2. Left ventricular ejection fraction, by estimation, is 60 to 65%. The  left ventricle has normal function. The left ventricle has no regional  wall motion abnormalities. There is mild left ventricular hypertrophy of  the basal-septal segment. Left  ventricular diastolic parameters are consistent with Grade I diastolic  dysfunction (impaired relaxation). Elevated left atrial pressure. The  average left ventricular global longitudinal strain is -18.4 %. The global  longitudinal strain is normal.   3. Right ventricular systolic function is normal. The right ventricular  size is normal.   4. The mitral valve is normal in structure. Mild mitral valve  regurgitation. Mild mitral stenosis. Moderate mitral annular  calcification.   5. The aortic valve is calcified. Aortic valve regurgitation is mild.  Severe aortic valve stenosis.   6. The inferior vena cava is normal in size with greater than 50%  respiratory variability, suggesting right atrial pressure of 3 mmHg.   Cath 10/10/21:   Ost RCA lesion is 75% stenosed.   RPAV lesion is 90% stenosed.   Ost LAD to Prox LAD lesion is 40% stenosed.   Previously placed Prox LAD to Mid LAD stent (unknown type) is  widely patent.  Patient Profile     70 y.o. female with past medical history of coronary artery disease status post PCI of the LAD 2013, mild to moderate aortic stenosis, diabetes mellitus, hypertension, hyperlipidemia, breast cancer  admitted with chest pain and dyspnea.  Electrocardiogram showed diffuse ST depression. Cardiac catheterization revealed 75% ostial right coronary artery, 90% RP AV and 40% ostial to proximal LAD; left ventricular end-diastolic pressure 8 mmHg. TTE with normal EF 60-65%. severe AS with mean gradient 31mHg, DI 0.21. Now awaiting surgical evaluation for possible CABG/AVR.  Assessment & Plan    #Unstable Angina: #Multivessel CAD: Patient presented with progressive chest pain consistent with classic angina. Cardiac catheterization 12/26 revealed 75% ostial right coronary artery, 90% RP AV and 40% ostial to proximal LAD; left ventricular end-diastolic pressure 8 mmHg. Was recommended for medical therapy, however, patient continued to have chest pain with minimal movement and intermittently at rest. TTE with LVEF 60-65% with severe aortic stenosis. Re-evaluation of cath films with interventional team suggestive that ostial RCA and prox LAD lesions likely hemodynamically significant. Currently awaiting CV surgery  evaluation for possible CABG+AVR.  -Continue heparin gtt given recurrent episodes of chest pain while at rest -Continue ASA 105m daily -Continue lipitor 871mdaily -Continue metoprolol 2575maily -Intermittent dosing of lasix; LVEDP 8mm69m-Would be very careful with nitro use given severe AS  #Severe Aortic Stenosis: TTE 12/26 with EF 60-65%, AVA 0.54, mean gradient 40mm20mDI 0.21. Given multivessel CAD as above, currently awaiting surgical evaluation for CABG-AVR. -CV surgery consulted for possible CABG-AVR  #HTN: Controlled.  -Continue amlodipine 2.5mg d73my -Continue metoprolol 25mg X9mily  #HLD: -Continue lipitor 80mg da14m #DMII: -ISS  For questions or updates, please contact [facility] [email or web address] for contact info under        Signed, Mella Inclan Freada Bergeron/27/2022, 8:08 AM

## 2021-10-10 NOTE — ED Notes (Signed)
EDP notified about critical lab.

## 2021-10-10 NOTE — Progress Notes (Signed)
ANTICOAGULATION CONSULT NOTE - Initial Consult  Pharmacy Consult for Heparin Indication: chest pain/ACS  Allergies  Allergen Reactions   Codeine     Hyper with vomiting   Reglan [Metoclopramide]     "pulls muscle to side" "feel paralyzed"    Patient Measurements: Height: 5' 1.25" (155.6 cm) Weight: 67.1 kg (148 lb) IBW/kg (Calculated) : 48.38 Heparin Dosing Weight: 63 kg  Vital Signs: Temp: 98.9 F (37.2 C) (12/26 0200) Temp Source: Axillary (12/26 0200) BP: 102/48 (12/26 0200) Pulse Rate: 87 (12/26 0200)  Labs: Recent Labs    10/09/21 1837 10/09/21 2209 10/09/21 2231  HGB 10.9*  --   --   HCT 33.4*  --   --   PLT 184  --   --   APTT  --   --  >200*  LABPROT  --   --  16.0*  INR  --   --  1.3*  CREATININE 1.45*  --   --   TROPONINIHS 15 45*  --     Estimated Creatinine Clearance: 31.4 mL/min (A) (by C-G formula based on SCr of 1.45 mg/dL (H)).   Medical History: Past Medical History:  Diagnosis Date   Alopecia    Anemia    Bicuspid aortic valve    mild to moderate AS by echo 2022   Carotid stenosis    1-39% bilateral carotid stenosis by dopplers 07/2021   Convalescence after chemotherapy    Coronary arteriosclerosis in native artery 2013   s/p PCI of LAD   DM (diabetes mellitus), type 1 (HCC)    Estrogen receptor negative    History of breast cancer    Hyperglycemia    Hyperlipidemia    Hypertension    Mild dehydration    Mitral regurgitation    mild to moderate by echo 2020   Murmur, heart    Swelling of joint of both wrists    Tendinitis of left wrist     Medications:  Medications Prior to Admission  Medication Sig Dispense Refill Last Dose   acetaminophen (TYLENOL) 500 MG tablet Take 1,000 mg by mouth every 6 (six) hours as needed for moderate pain or headache.   Past Month   amLODipine (NORVASC) 2.5 MG tablet TAKE 1 TABLET EVERY DAY (Patient taking differently: Take 2.5 mg by mouth at bedtime.) 90 tablet 2 10/08/2021   aspirin EC 81 MG  tablet Take 81 mg by mouth at bedtime.   10/08/2021   atorvastatin (LIPITOR) 40 MG tablet Take 40 mg by mouth at bedtime.   10/08/2021   Biotin w/ Vitamins C & E (HAIR/SKIN/NAILS PO) Take 1 capsule by mouth daily.   10/09/2021   Calcium Carb-Cholecalciferol (CALCIUM 600 + D PO) Take 1 tablet by mouth daily.   10/09/2021   celecoxib (CELEBREX) 200 MG capsule Take 200 mg by mouth daily.   10/09/2021   cholecalciferol (VITAMIN D) 25 MCG (1000 UNIT) tablet Take 1,000 Units by mouth daily.   10/09/2021   fexofenadine (ALLEGRA) 180 MG tablet Take 180 mg by mouth daily as needed for allergies.   Past Month   fluticasone (FLONASE) 50 MCG/ACT nasal spray Place into both nostrils as needed.    unk   fluticasone-salmeterol (ADVAIR HFA) 230-21 MCG/ACT inhaler Inhale 2 puffs into the lungs 2 (two) times daily. 1 each 12 10/09/2021   glucose blood test strip 1 each by Other route 4 (four) times daily -  before meals and at bedtime. Use as instructed  insulin NPH Human (NOVOLIN N) 100 UNIT/ML injection Inject 18 Units into the skin 2 (two) times daily before a meal.   10/09/2021   insulin regular (NOVOLIN R) 100 units/mL injection Inject 3-5 Units into the skin See admin instructions. Per sliding scale 3 times daily with meals   10/09/2021   loperamide (IMODIUM) 1 MG/5ML solution Take by mouth as needed for diarrhea or loose stools.   unk   losartan (COZAAR) 50 MG tablet Take 1 tablet (50 mg total) by mouth daily. 90 tablet 3 10/09/2021   metoprolol succinate (TOPROL XL) 25 MG 24 hr tablet Take 1 tablet (25 mg total) by mouth daily. (Patient taking differently: Take 25 mg by mouth at bedtime.) 90 tablet 3 10/08/2021 at 2000   nitroGLYCERIN (NITROSTAT) 0.4 MG SL tablet Place 0.4 mg under the tongue every 5 (five) minutes as needed for chest pain.   unk   vitamin B-12 (CYANOCOBALAMIN) 1000 MCG tablet Take 1,000 mcg by mouth daily.   10/09/2021   Efinaconazole 10 % SOLN Apply 1 drop topically daily. (Patient not  taking: Reported on 10/09/2021) 4 mL 11 Not Taking    Assessment: 70 y.o. F presents with CP/ STEMI. S/p cath lab 12/26 - no interventions done. Plan to start heparin 4 hours post TR band removal (removed at 0110). No AC PTA. Hgb 10.9 on admission. Noted pt with coughing and pink tinged sputum.  Goal of Therapy:  Heparin level 0.3-0.7 units/ml Monitor platelets by anticoagulation protocol: Yes   Plan:  At [place], start heparin gtt at 800 units/hr Will f/u heparin level in 8 hours Daily heparin level and CBC F/u for any further pink-tinged sputum  Sherlon Handing, PharmD, BCPS Please see amion for complete clinical pharmacist phone list 10/10/2021,2:17 AM

## 2021-10-10 NOTE — Progress Notes (Signed)
Cards fellow notified of pt's increased SOB, cough w/pink tinged secretions, wet lung sounds. Given one time dose 40mg  lasix and to start NTG gtt to avoid HTN.

## 2021-10-10 NOTE — Progress Notes (Signed)
Echocardiogram 2D Echocardiogram has been performed.  Molly Hill M 10/10/2021, 9:05 AM

## 2021-10-11 ENCOUNTER — Encounter (HOSPITAL_COMMUNITY): Payer: Self-pay | Admitting: Anesthesiology

## 2021-10-11 ENCOUNTER — Encounter (HOSPITAL_COMMUNITY): Payer: Self-pay | Admitting: Cardiovascular Disease

## 2021-10-11 ENCOUNTER — Inpatient Hospital Stay (HOSPITAL_COMMUNITY): Payer: Medicare HMO

## 2021-10-11 DIAGNOSIS — Z01810 Encounter for preprocedural cardiovascular examination: Secondary | ICD-10-CM

## 2021-10-11 DIAGNOSIS — I2510 Atherosclerotic heart disease of native coronary artery without angina pectoris: Secondary | ICD-10-CM

## 2021-10-11 DIAGNOSIS — I350 Nonrheumatic aortic (valve) stenosis: Secondary | ICD-10-CM

## 2021-10-11 LAB — BLOOD CULTURE ID PANEL (REFLEXED) - BCID2
A.calcoaceticus-baumannii: NOT DETECTED
A.calcoaceticus-baumannii: NOT DETECTED
Bacteroides fragilis: NOT DETECTED
Bacteroides fragilis: NOT DETECTED
CTX-M ESBL: NOT DETECTED
Candida albicans: NOT DETECTED
Candida albicans: NOT DETECTED
Candida auris: NOT DETECTED
Candida auris: NOT DETECTED
Candida glabrata: NOT DETECTED
Candida glabrata: NOT DETECTED
Candida krusei: NOT DETECTED
Candida krusei: NOT DETECTED
Candida parapsilosis: NOT DETECTED
Candida parapsilosis: NOT DETECTED
Candida tropicalis: NOT DETECTED
Candida tropicalis: NOT DETECTED
Carbapenem resist OXA 48 LIKE: NOT DETECTED
Carbapenem resistance IMP: NOT DETECTED
Carbapenem resistance KPC: NOT DETECTED
Carbapenem resistance NDM: NOT DETECTED
Carbapenem resistance VIM: NOT DETECTED
Cryptococcus neoformans/gattii: NOT DETECTED
Cryptococcus neoformans/gattii: NOT DETECTED
Enterobacter cloacae complex: NOT DETECTED
Enterobacter cloacae complex: NOT DETECTED
Enterobacterales: DETECTED — AB
Enterobacterales: NOT DETECTED
Enterococcus Faecium: NOT DETECTED
Enterococcus Faecium: NOT DETECTED
Enterococcus faecalis: NOT DETECTED
Enterococcus faecalis: NOT DETECTED
Escherichia coli: DETECTED — AB
Escherichia coli: NOT DETECTED
Haemophilus influenzae: NOT DETECTED
Haemophilus influenzae: NOT DETECTED
Klebsiella aerogenes: NOT DETECTED
Klebsiella aerogenes: NOT DETECTED
Klebsiella oxytoca: NOT DETECTED
Klebsiella oxytoca: NOT DETECTED
Klebsiella pneumoniae: NOT DETECTED
Klebsiella pneumoniae: NOT DETECTED
Listeria monocytogenes: NOT DETECTED
Listeria monocytogenes: NOT DETECTED
Neisseria meningitidis: NOT DETECTED
Neisseria meningitidis: NOT DETECTED
Proteus species: NOT DETECTED
Proteus species: NOT DETECTED
Pseudomonas aeruginosa: NOT DETECTED
Pseudomonas aeruginosa: NOT DETECTED
Salmonella species: NOT DETECTED
Salmonella species: NOT DETECTED
Serratia marcescens: NOT DETECTED
Serratia marcescens: NOT DETECTED
Staphylococcus aureus (BCID): NOT DETECTED
Staphylococcus aureus (BCID): NOT DETECTED
Staphylococcus epidermidis: NOT DETECTED
Staphylococcus epidermidis: NOT DETECTED
Staphylococcus lugdunensis: NOT DETECTED
Staphylococcus lugdunensis: NOT DETECTED
Staphylococcus species: NOT DETECTED
Staphylococcus species: DETECTED — AB
Stenotrophomonas maltophilia: NOT DETECTED
Stenotrophomonas maltophilia: NOT DETECTED
Streptococcus agalactiae: NOT DETECTED
Streptococcus agalactiae: NOT DETECTED
Streptococcus pneumoniae: NOT DETECTED
Streptococcus pneumoniae: NOT DETECTED
Streptococcus pyogenes: NOT DETECTED
Streptococcus pyogenes: NOT DETECTED
Streptococcus species: NOT DETECTED
Streptococcus species: NOT DETECTED

## 2021-10-11 LAB — CBC
HCT: 26.5 % — ABNORMAL LOW (ref 36.0–46.0)
Hemoglobin: 8.9 g/dL — ABNORMAL LOW (ref 12.0–15.0)
MCH: 31.4 pg (ref 26.0–34.0)
MCHC: 33.6 g/dL (ref 30.0–36.0)
MCV: 93.6 fL (ref 80.0–100.0)
Platelets: 159 10*3/uL (ref 150–400)
RBC: 2.83 MIL/uL — ABNORMAL LOW (ref 3.87–5.11)
RDW: 13.6 % (ref 11.5–15.5)
WBC: 9.2 10*3/uL (ref 4.0–10.5)
nRBC: 0 % (ref 0.0–0.2)

## 2021-10-11 LAB — POCT I-STAT 7, (LYTES, BLD GAS, ICA,H+H)
Acid-Base Excess: 2 mmol/L (ref 0.0–2.0)
Bicarbonate: 25.9 mmol/L (ref 20.0–28.0)
Calcium, Ion: 1.19 mmol/L (ref 1.15–1.40)
HCT: 25 % — ABNORMAL LOW (ref 36.0–46.0)
Hemoglobin: 8.5 g/dL — ABNORMAL LOW (ref 12.0–15.0)
O2 Saturation: 93 %
Patient temperature: 100.4
Potassium: 4.2 mmol/L (ref 3.5–5.1)
Sodium: 131 mmol/L — ABNORMAL LOW (ref 135–145)
TCO2: 27 mmol/L (ref 22–32)
pCO2 arterial: 37.7 mmHg (ref 32.0–48.0)
pH, Arterial: 7.448 (ref 7.350–7.450)
pO2, Arterial: 66 mmHg — ABNORMAL LOW (ref 83.0–108.0)

## 2021-10-11 LAB — LACTIC ACID, PLASMA: Lactic Acid, Venous: 0.7 mmol/L (ref 0.5–1.9)

## 2021-10-11 LAB — GLUCOSE, CAPILLARY
Glucose-Capillary: 190 mg/dL — ABNORMAL HIGH (ref 70–99)
Glucose-Capillary: 229 mg/dL — ABNORMAL HIGH (ref 70–99)
Glucose-Capillary: 232 mg/dL — ABNORMAL HIGH (ref 70–99)
Glucose-Capillary: 238 mg/dL — ABNORMAL HIGH (ref 70–99)
Glucose-Capillary: 299 mg/dL — ABNORMAL HIGH (ref 70–99)

## 2021-10-11 LAB — HEPARIN LEVEL (UNFRACTIONATED): Heparin Unfractionated: 0.44 IU/mL (ref 0.30–0.70)

## 2021-10-11 LAB — BASIC METABOLIC PANEL
Anion gap: 8 (ref 5–15)
BUN: 30 mg/dL — ABNORMAL HIGH (ref 8–23)
CO2: 24 mmol/L (ref 22–32)
Calcium: 8.3 mg/dL — ABNORMAL LOW (ref 8.9–10.3)
Chloride: 98 mmol/L (ref 98–111)
Creatinine, Ser: 1.2 mg/dL — ABNORMAL HIGH (ref 0.44–1.00)
GFR, Estimated: 48 mL/min — ABNORMAL LOW (ref >60)
Glucose, Bld: 215 mg/dL — ABNORMAL HIGH (ref 70–99)
Potassium: 4.1 mmol/L (ref 3.5–5.1)
Sodium: 130 mmol/L — ABNORMAL LOW (ref 135–145)

## 2021-10-11 LAB — TROPONIN I (HIGH SENSITIVITY): Troponin I (High Sensitivity): 199 ng/L (ref <18)

## 2021-10-11 LAB — MAGNESIUM: Magnesium: 1.6 mg/dL — ABNORMAL LOW (ref 1.7–2.4)

## 2021-10-11 MED ORDER — HEPARIN 30,000 UNITS/1000 ML (OHS) CELLSAVER SOLUTION
Status: DC
  Filled 2021-10-11: qty 1000

## 2021-10-11 MED ORDER — EPINEPHRINE HCL 5 MG/250ML IV SOLN IN NS
0.0000 ug/min | INTRAVENOUS | Status: DC
  Filled 2021-10-11: qty 250

## 2021-10-11 MED ORDER — CEFAZOLIN SODIUM-DEXTROSE 2-4 GM/100ML-% IV SOLN
2.0000 g | INTRAVENOUS | Status: DC
  Filled 2021-10-11: qty 100

## 2021-10-11 MED ORDER — PLASMA-LYTE A IV SOLN
INTRAVENOUS | Status: AC
  Filled 2021-10-11: qty 5

## 2021-10-11 MED ORDER — POTASSIUM CHLORIDE 2 MEQ/ML IV SOLN
80.0000 meq | INTRAVENOUS | Status: DC
  Filled 2021-10-11: qty 40

## 2021-10-11 MED ORDER — VANCOMYCIN HCL 1250 MG/250ML IV SOLN
1250.0000 mg | INTRAVENOUS | Status: DC
  Filled 2021-10-11: qty 250

## 2021-10-11 MED ORDER — MANNITOL 20 % IV SOLN
INTRAVENOUS | Status: DC
  Filled 2021-10-11: qty 13

## 2021-10-11 MED ORDER — TRANEXAMIC ACID (OHS) BOLUS VIA INFUSION
15.0000 mg/kg | INTRAVENOUS | Status: DC
  Filled 2021-10-11: qty 1061

## 2021-10-11 MED ORDER — SALINE SPRAY 0.65 % NA SOLN
1.0000 | NASAL | Status: DC | PRN
  Filled 2021-10-11: qty 44

## 2021-10-11 MED ORDER — CEFEPIME HCL 2 G IJ SOLR
2.0000 g | Freq: Two times a day (BID) | INTRAMUSCULAR | Status: DC
  Administered 2021-10-11: 18:00:00 2 g via INTRAVENOUS
  Filled 2021-10-11: qty 2

## 2021-10-11 MED ORDER — DOCUSATE SODIUM 100 MG PO CAPS
200.0000 mg | ORAL_CAPSULE | Freq: Every day | ORAL | Status: DC
  Administered 2021-10-11 – 2021-10-14 (×4): 200 mg via ORAL
  Filled 2021-10-11 (×4): qty 2

## 2021-10-11 MED ORDER — PHENYLEPHRINE HCL-NACL 20-0.9 MG/250ML-% IV SOLN
30.0000 ug/min | INTRAVENOUS | Status: AC
  Filled 2021-10-11: qty 250

## 2021-10-11 MED ORDER — INSULIN REGULAR(HUMAN) IN NACL 100-0.9 UT/100ML-% IV SOLN
INTRAVENOUS | Status: AC
  Filled 2021-10-11: qty 100

## 2021-10-11 MED ORDER — MILRINONE LACTATE IN DEXTROSE 20-5 MG/100ML-% IV SOLN
0.3000 ug/kg/min | INTRAVENOUS | Status: DC
  Filled 2021-10-11: qty 100

## 2021-10-11 MED ORDER — TRANEXAMIC ACID 1000 MG/10ML IV SOLN
1.5000 mg/kg/h | INTRAVENOUS | Status: DC
  Filled 2021-10-11: qty 25

## 2021-10-11 MED ORDER — SODIUM CHLORIDE 0.9 % IV SOLN
2.0000 g | INTRAVENOUS | Status: DC
  Administered 2021-10-12 – 2021-10-13 (×2): 2 g via INTRAVENOUS
  Filled 2021-10-11 (×2): qty 20

## 2021-10-11 MED ORDER — TRANEXAMIC ACID (OHS) PUMP PRIME SOLUTION
2.0000 mg/kg | INTRAVENOUS | Status: DC
  Filled 2021-10-11: qty 1.41

## 2021-10-11 MED ORDER — NOREPINEPHRINE 4 MG/250ML-% IV SOLN
0.0000 ug/min | INTRAVENOUS | Status: DC
  Filled 2021-10-11: qty 250

## 2021-10-11 MED ORDER — NITROGLYCERIN IN D5W 200-5 MCG/ML-% IV SOLN
2.0000 ug/min | INTRAVENOUS | Status: DC
  Filled 2021-10-11: qty 250

## 2021-10-11 MED ORDER — MAGNESIUM SULFATE 2 GM/50ML IV SOLN
2.0000 g | Freq: Once | INTRAVENOUS | Status: AC
Start: 2021-10-11 — End: 2021-10-11
  Administered 2021-10-11: 12:00:00 2 g via INTRAVENOUS
  Filled 2021-10-11: qty 50

## 2021-10-11 MED ORDER — CEFEPIME HCL 2 G IJ SOLR
2.0000 g | Freq: Two times a day (BID) | INTRAMUSCULAR | Status: DC
Start: 2021-10-11 — End: 2021-10-11

## 2021-10-11 MED ORDER — DEXMEDETOMIDINE HCL IN NACL 400 MCG/100ML IV SOLN
0.1000 ug/kg/h | INTRAVENOUS | Status: DC
  Filled 2021-10-11: qty 100

## 2021-10-11 NOTE — Plan of Care (Signed)
°  Problem: Education: Goal: Understanding of cardiac disease, CV risk reduction, and recovery process will improve Outcome: Progressing Goal: Understanding of medication regimen will improve Outcome: Progressing Goal: Individualized Educational Video(s) Outcome: Progressing   Problem: Activity: Goal: Ability to tolerate increased activity will improve Outcome: Progressing   Problem: Cardiac: Goal: Ability to achieve and maintain adequate cardiopulmonary perfusion will improve Outcome: Progressing Goal: Vascular access site(s) Level 0-1 will be maintained Outcome: Progressing   Problem: Health Behavior/Discharge Planning: Goal: Ability to safely manage health-related needs after discharge will improve Outcome: Progressing   Problem: Education: Goal: Knowledge of General Education information will improve Description: Including pain rating scale, medication(s)/side effects and non-pharmacologic comfort measures Outcome: Progressing   Problem: Health Behavior/Discharge Planning: Goal: Ability to manage health-related needs will improve Outcome: Progressing   Problem: Activity: Goal: Risk for activity intolerance will decrease Outcome: Progressing

## 2021-10-11 NOTE — Progress Notes (Signed)
Pre CABG has been completed.   Preliminary results in CV Proc.   Jarrett Chicoine Maryelizabeth Eberle 10/11/2021 2:08 PM

## 2021-10-11 NOTE — Progress Notes (Signed)
Pharmacy Antibiotic Note  Molly Hill is a 70 y.o. female admitted on 10/09/2021 with gram negative  bacteremia.  Pharmacy has been consulted for Cefepime dosing. Patient with gram negative rod growing in blood culture - BCID pending. Fever of 102.1 today. Called Dr. Johney Frame and received order to start empiric Cefepime. Discussed with RN and patient. Antibiotic pulled for RN to give stat. WBC wnl. SCr 1.20 with CrCl ~38 mL/min.   Plan: Cefepime 2g IV every 12 hours.  Monitor renal function, culture results, and clinical status.   Height: 5' 1.25" (155.6 cm) Weight: 70.7 kg (155 lb 13.8 oz) IBW/kg (Calculated) : 48.38  Temp (24hrs), Avg:100.3 F (37.9 C), Min:98.7 F (37.1 C), Max:102.2 F (39 C)  Recent Labs  Lab 10/09/21 1837 10/10/21 0605 10/11/21 0022  WBC 9.3 8.7 9.2  CREATININE 1.45* 1.14* 1.20*  LATICACIDVEN  --   --  0.7    Estimated Creatinine Clearance: 38.9 mL/min (A) (by C-G formula based on SCr of 1.2 mg/dL (H)).    Allergies  Allergen Reactions   Codeine     Hyper with vomiting   Onion Diarrhea, Nausea And Vomiting and Other (See Comments)    syncope   Reglan [Metoclopramide]     "pulls muscle to side" "feel paralyzed"    Antimicrobials this admission: 12/27 Cefepime >>  Dose adjustments this admission:   Microbiology results: 12/27 BCx: 1 bottle GNR  (BCID pending) 12/26 MRSA PCR: negative  Thank you for allowing pharmacy to be a part of this patients care.  Sloan Leiter, PharmD, BCPS, BCCCP Clinical Pharmacist Please refer to [facility] for [place] numbers 10/11/2021 6:04 PM

## 2021-10-11 NOTE — Progress Notes (Signed)
Pt requested home saline nasal spray and home dose of colace 200mg  daily be re-ordered, have done.

## 2021-10-11 NOTE — Telephone Encounter (Signed)
Called pt to review Dr Theodosia Blender orders with her.  Pt reports she is currently hospitalized for CP/SOB and is having "surgery" today.    Per Dr Jacolyn Reedy progress note from 12/27: 70 y.o. female with past medical history of coronary artery disease status post PCI of the LAD 2013, mild to moderate aortic stenosis, diabetes mellitus, hypertension, hyperlipidemia, breast cancer admitted with chest pain and dyspnea.  Electrocardiogram showed diffuse ST depression. Cardiac catheterization revealed 75% ostial right coronary artery, 90% RP AV and 40% ostial to proximal LAD; left ventricular end-diastolic pressure 8 mmHg. TTE with normal EF 60-65%. severe AS with mean gradient 58mmHg, DI 0.21. Now awaiting surgical evaluation for possible CABG/AVR.   Assessment & Plan    #Unstable Angina: #Multivessel CAD: Patient presented with progressive chest pain consistent with classic angina. Cardiac catheterization 12/26 revealed 75% ostial right coronary artery, 90% RP AV and 40% ostial to proximal LAD; left ventricular end-diastolic pressure 8 mmHg. Was recommended for medical therapy, however, patient continued to have chest pain with minimal movement and intermittently at rest. TTE with LVEF 60-65% with severe aortic stenosis. Re-evaluation of cath films with interventional team suggestive that ostial RCA and prox LAD lesions likely hemodynamically significant. Currently awaiting CV surgery evaluation for possible CABG+AVR.  -Continue heparin gtt given recurrent episodes of chest pain while at rest -Continue ASA 81mg  daily -Continue lipitor 80mg  daily -Continue metoprolol 25mg  daily -Intermittent dosing of lasix; LVEDP 50mmHg -Would be very careful with nitro use given severe AS   #Severe Aortic Stenosis: TTE 12/26 with EF 60-65%, AVA 0.54, mean gradient 46mmHg, DI 0.21. Given multivessel CAD as above, currently awaiting surgical evaluation for CABG-AVR. -CV surgery consulted for possible CABG-AVR    #HTN: Controlled.  -Continue amlodipine 2.5mg  daily -Continue metoprolol 25mg  XL daily  Will forward to Dr Radford Pax for her knowledge.

## 2021-10-11 NOTE — Progress Notes (Addendum)
PHARMACY - PHYSICIAN COMMUNICATION CRITICAL VALUE ALERT - BLOOD CULTURE IDENTIFICATION (BCID)  Molly Hill is an 70 y.o. female who presented to [facility] on 10/09/2021 with a chief complaint of chest pain and shortness of breath.   Assessment:  Patient status post cath with fever to 102.1 today and blood culture with GNR who was started empirically on Cefepime. BCID just resulted with positive for Ecoli.  No known allergies to antibiotics.   Name of physician (or Provider) Contacted: Dr. Cyndia Bent  Current antibiotics: Cefepime  Changes to prescribed antibiotics recommended:  Recommendations accepted by provider- [place] to Ceftriaxone 2gm IV every 24 hours.   Results for orders placed or performed during the hospital encounter of 10/09/21  Blood Culture ID Panel (Reflexed) (Collected: 10/11/2021  1:12 AM)  Result Value Ref Range   Enterococcus faecalis NOT DETECTED NOT DETECTED   Enterococcus Faecium NOT DETECTED NOT DETECTED   Listeria monocytogenes NOT DETECTED NOT DETECTED   Staphylococcus species NOT DETECTED NOT DETECTED   Staphylococcus aureus (BCID) NOT DETECTED NOT DETECTED   Staphylococcus epidermidis NOT DETECTED NOT DETECTED   Staphylococcus lugdunensis NOT DETECTED NOT DETECTED   Streptococcus species NOT DETECTED NOT DETECTED   Streptococcus agalactiae NOT DETECTED NOT DETECTED   Streptococcus pneumoniae NOT DETECTED NOT DETECTED   Streptococcus pyogenes NOT DETECTED NOT DETECTED   A.calcoaceticus-baumannii NOT DETECTED NOT DETECTED   Bacteroides fragilis NOT DETECTED NOT DETECTED   Enterobacterales DETECTED (A) NOT DETECTED   Enterobacter cloacae complex NOT DETECTED NOT DETECTED   Escherichia coli DETECTED (A) NOT DETECTED   Klebsiella aerogenes NOT DETECTED NOT DETECTED   Klebsiella oxytoca NOT DETECTED NOT DETECTED   Klebsiella pneumoniae NOT DETECTED NOT DETECTED   Proteus species NOT DETECTED NOT DETECTED   Salmonella species NOT DETECTED NOT DETECTED    Serratia marcescens NOT DETECTED NOT DETECTED   Haemophilus influenzae NOT DETECTED NOT DETECTED   Neisseria meningitidis NOT DETECTED NOT DETECTED   Pseudomonas aeruginosa NOT DETECTED NOT DETECTED   Stenotrophomonas maltophilia NOT DETECTED NOT DETECTED   Candida albicans NOT DETECTED NOT DETECTED   Candida auris NOT DETECTED NOT DETECTED   Candida glabrata NOT DETECTED NOT DETECTED   Candida krusei NOT DETECTED NOT DETECTED   Candida parapsilosis NOT DETECTED NOT DETECTED   Candida tropicalis NOT DETECTED NOT DETECTED   Cryptococcus neoformans/gattii NOT DETECTED NOT DETECTED   CTX-M ESBL NOT DETECTED NOT DETECTED   Carbapenem resistance IMP NOT DETECTED NOT DETECTED   Carbapenem resistance KPC NOT DETECTED NOT DETECTED   Carbapenem resistance NDM NOT DETECTED NOT DETECTED   Carbapenem resist OXA 48 LIKE NOT DETECTED NOT DETECTED   Carbapenem resistance VIM NOT DETECTED NOT DETECTED   Addnedum: Results for orders placed or performed during the hospital encounter of 10/09/21  Blood Culture ID Panel (Reflexed) (Collected: 10/11/2021  1:27 AM)  Result Value Ref Range   Enterococcus faecalis NOT DETECTED NOT DETECTED   Enterococcus Faecium NOT DETECTED NOT DETECTED   Listeria monocytogenes NOT DETECTED NOT DETECTED   Staphylococcus species DETECTED (A) NOT DETECTED   Staphylococcus aureus (BCID) NOT DETECTED NOT DETECTED   Staphylococcus epidermidis NOT DETECTED NOT DETECTED   Staphylococcus lugdunensis NOT DETECTED NOT DETECTED   Streptococcus species NOT DETECTED NOT DETECTED   Streptococcus agalactiae NOT DETECTED NOT DETECTED   Streptococcus pneumoniae NOT DETECTED NOT DETECTED   Streptococcus pyogenes NOT DETECTED NOT DETECTED   A.calcoaceticus-baumannii NOT DETECTED NOT DETECTED   Bacteroides fragilis NOT DETECTED NOT DETECTED   Enterobacterales NOT DETECTED NOT  DETECTED   Enterobacter cloacae complex NOT DETECTED NOT DETECTED   Escherichia coli NOT DETECTED NOT DETECTED    Klebsiella aerogenes NOT DETECTED NOT DETECTED   Klebsiella oxytoca NOT DETECTED NOT DETECTED   Klebsiella pneumoniae NOT DETECTED NOT DETECTED   Proteus species NOT DETECTED NOT DETECTED   Salmonella species NOT DETECTED NOT DETECTED   Serratia marcescens NOT DETECTED NOT DETECTED   Haemophilus influenzae NOT DETECTED NOT DETECTED   Neisseria meningitidis NOT DETECTED NOT DETECTED   Pseudomonas aeruginosa NOT DETECTED NOT DETECTED   Stenotrophomonas maltophilia NOT DETECTED NOT DETECTED   Candida albicans NOT DETECTED NOT DETECTED   Candida auris NOT DETECTED NOT DETECTED   Candida glabrata NOT DETECTED NOT DETECTED   Candida krusei NOT DETECTED NOT DETECTED   Candida parapsilosis NOT DETECTED NOT DETECTED   Candida tropicalis NOT DETECTED NOT DETECTED   Cryptococcus neoformans/gattii NOT DETECTED NOT DETECTED   1 out of 3 with Staph species. No resistance. Possible contaminant. Covered by current coverage. No change made.   [name] 10/11/2021  6:43 PM

## 2021-10-11 NOTE — Care Management (Signed)
°  Transition of Care (TOC) Screening Note   Patient Details  Name: Molly Hill Date of Birth: 1951/07/08   Transition of Care [facility]) CM/SW Contact:    Carles Collet, RN Phone Number: 10/11/2021, 3:18 PM    Transition of Care Department [facility]) has reviewed patient and no TOC needs have been identified at this time. We will continue to monitor patient advancement through interdisciplinary progression rounds. If new patient transition needs arise, please place a TOC consult.

## 2021-10-11 NOTE — Progress Notes (Signed)
[facility] for IV Heparin Indication: chest pain/ACS  Allergies  Allergen Reactions   Codeine     Hyper with vomiting   Onion Diarrhea, Nausea And Vomiting and Other (See Comments)    syncope   Reglan [Metoclopramide]     "pulls muscle to side" "feel paralyzed"    Patient Measurements: Height: 5' 1.25" (155.6 cm) Weight: 70.7 kg (155 lb 13.8 oz) IBW/kg (Calculated) : 48.38 Heparin Dosing Weight: 63 kg  Vital Signs: Temp: 100.4 F (38 C) (12/27 0004) Temp Source: Oral (12/27 0004) BP: 103/41 (12/27 0130) Pulse Rate: 84 (12/27 0130)  Labs: Recent Labs    10/09/21 1837 10/09/21 2209 10/09/21 2231 10/10/21 0605 10/10/21 1015 10/10/21 1415 10/11/21 0002 10/11/21 0022  HGB 10.9*  --   --  9.0*  --   --  8.5* 8.9*  HCT 33.4*  --   --  27.7*  --   --  25.0* 26.5*  PLT 184  --   --  164  --   --   --  159  APTT  --   --  >200*  --   --   --   --   --   LABPROT  --   --  16.0* 14.1  --   --   --   --   INR  --   --  1.3* 1.1  --   --   --   --   HEPARINUNFRC  --   --   --   --   --  0.18*  --  0.35  CREATININE 1.45*  --   --  1.14*  --   --   --  1.20*  TROPONINIHS 15   < >  --  154* 199*  --   --  199*   < > = values in this interval not displayed.     Estimated Creatinine Clearance: 38.9 mL/min (A) (by C-G formula based on SCr of 1.2 mg/dL (H)).   Medical History: Past Medical History:  Diagnosis Date   Alopecia    Anemia    Bicuspid aortic valve    mild to moderate AS by echo 2022   Carotid stenosis    1-39% bilateral carotid stenosis by dopplers 07/2021   Convalescence after chemotherapy    Coronary arteriosclerosis in native artery 2013   s/p PCI of LAD   DM (diabetes mellitus), type 1 (HCC)    Estrogen receptor negative    History of breast cancer    Hyperglycemia    Hyperlipidemia    Hypertension    Mild dehydration    Mitral regurgitation    mild to moderate by echo 2020   Murmur, heart    Swelling of  joint of both wrists    Tendinitis of left wrist     Medications:  Medications Prior to Admission  Medication Sig Dispense Refill Last Dose   acetaminophen (TYLENOL) 500 MG tablet Take 1,000 mg by mouth every 6 (six) hours as needed for moderate pain or headache.   Past Month   amLODipine (NORVASC) 2.5 MG tablet TAKE 1 TABLET EVERY DAY (Patient taking differently: Take 2.5 mg by mouth at bedtime.) 90 tablet 2 10/08/2021   aspirin EC 81 MG tablet Take 81 mg by mouth at bedtime.   10/08/2021   atorvastatin (LIPITOR) 40 MG tablet Take 40 mg by mouth at bedtime.   10/08/2021   Biotin w/ Vitamins C & E (HAIR/SKIN/NAILS PO)  Take 1 capsule by mouth daily.   10/09/2021   Calcium Carb-Cholecalciferol (CALCIUM 600 + D PO) Take 1 tablet by mouth daily.   10/09/2021   celecoxib (CELEBREX) 200 MG capsule Take 200 mg by mouth daily.   10/09/2021   cholecalciferol (VITAMIN D) 25 MCG (1000 UNIT) tablet Take 1,000 Units by mouth daily.   10/09/2021   fexofenadine (ALLEGRA) 180 MG tablet Take 180 mg by mouth daily as needed for allergies.   Past Month   fluticasone (FLONASE) 50 MCG/ACT nasal spray Place into both nostrils as needed.    unk   fluticasone-salmeterol (ADVAIR HFA) 230-21 MCG/ACT inhaler Inhale 2 puffs into the lungs 2 (two) times daily. 1 each 12 10/09/2021   glucose blood test strip 1 each by Other route 4 (four) times daily -  before meals and at bedtime. Use as instructed      insulin NPH Human (NOVOLIN N) 100 UNIT/ML injection Inject 18 Units into the skin 2 (two) times daily before a meal.   10/09/2021   insulin regular (NOVOLIN R) 100 units/mL injection Inject 3-5 Units into the skin See admin instructions. Per sliding scale 3 times daily with meals   10/09/2021   loperamide (IMODIUM) 1 MG/5ML solution Take by mouth as needed for diarrhea or loose stools.   unk   losartan (COZAAR) 50 MG tablet Take 1 tablet (50 mg total) by mouth daily. 90 tablet 3 10/09/2021   metoprolol succinate (TOPROL  XL) 25 MG 24 hr tablet Take 1 tablet (25 mg total) by mouth daily. (Patient taking differently: Take 25 mg by mouth at bedtime.) 90 tablet 3 10/08/2021 at 2000   nitroGLYCERIN (NITROSTAT) 0.4 MG SL tablet Place 0.4 mg under the tongue every 5 (five) minutes as needed for chest pain.   unk   vitamin B-12 (CYANOCOBALAMIN) 1000 MCG tablet Take 1,000 mcg by mouth daily.   10/09/2021   Efinaconazole 10 % SOLN Apply 1 drop topically daily. (Patient not taking: Reported on 10/09/2021) 4 mL 11 Not Taking    Assessment: 70 y.o. F presents with CP/ STEMI. S/p cath lab 12/26 - no interventions done. Plan to start heparin 4 hours post TR band removal (removed at 0110). No AC PTA. Hgb 10.9 on admission. Noted pt with coughing and pink tinged sputum.  Heparin level now therapeutic on 950 units/hr.  No overt bleeding or complications noted.  Goal of Therapy:  Heparin level 0.3-0.7 units/ml Monitor platelets by anticoagulation protocol: Yes   Plan:  Continue IV heparin to 950 units/hr. Confirmation heparin level in 6 hrs. Daily heparin level and CBC.  [employer], Pharm.D., BCPS Clinical Pharmacist  **Pharmacist phone directory can be found on [email or web address] listed under [place].  10/11/2021 1:45 AM

## 2021-10-11 NOTE — Progress Notes (Signed)
ANTICOAGULATION CONSULT NOTE   Pharmacy Consult for IV Heparin Indication: chest pain/ACS  Allergies  Allergen Reactions   Codeine     Hyper with vomiting   Onion Diarrhea, Nausea And Vomiting and Other (See Comments)    syncope   Reglan [Metoclopramide]     "pulls muscle to side" "feel paralyzed"    Patient Measurements: Height: 5' 1.25" (155.6 cm) Weight: 70.7 kg (155 lb 13.8 oz) IBW/kg (Calculated) : 48.38 Heparin Dosing Weight: 63 kg  Vital Signs: Temp: 99.1 F (37.3 C) (12/27 1200) Temp Source: Axillary (12/27 1200) BP: 116/49 (12/27 1500) Pulse Rate: 80 (12/27 1500)  Labs: Recent Labs    10/09/21 1837 10/09/21 2209 10/09/21 2231 10/10/21 0605 10/10/21 1015 10/10/21 1415 10/11/21 0002 10/11/21 0022 10/11/21 1500  HGB 10.9*  --   --  9.0*  --   --  8.5* 8.9*  --   HCT 33.4*  --   --  27.7*  --   --  25.0* 26.5*  --   PLT 184  --   --  164  --   --   --  159  --   APTT  --   --  >200*  --   --   --   --   --   --   LABPROT  --   --  16.0* 14.1  --   --   --   --   --   INR  --   --  1.3* 1.1  --   --   --   --   --   HEPARINUNFRC  --   --   --   --   --  0.18*  --  QUESTIONABLE RESULTS, RECOMMEND RECOLLECT TO VERIFY 0.44  CREATININE 1.45*  --   --  1.14*  --   --   --  1.20*  --   TROPONINIHS 15   < >  --  154* 199*  --   --  199*  --    < > = values in this interval not displayed.     Estimated Creatinine Clearance: 38.9 mL/min (A) (by C-G formula based on SCr of 1.2 mg/dL (H)).   Medical History: Past Medical History:  Diagnosis Date   Alopecia    Anemia    Bicuspid aortic valve    mild to moderate AS by echo 2022   Carotid stenosis    1-39% bilateral carotid stenosis by dopplers 07/2021   Convalescence after chemotherapy    Coronary arteriosclerosis in native artery 2013   s/p PCI of LAD   DM (diabetes mellitus), type 1 (HCC)    Estrogen receptor negative    History of breast cancer    Hyperglycemia    Hyperlipidemia    Hypertension     Mild dehydration    Mitral regurgitation    mild to moderate by echo 2020   Murmur, heart    Swelling of joint of both wrists    Tendinitis of left wrist     Medications:    Assessment: 70 y.o. F presents with CP/ STEMI. S/p cath lab 12/26 - no interventions done. Plan to start heparin 4 hours post TR band removal (removed at 0110). No AC PTA. Hgb 10.9 on admission. Noted pt with coughing and pink tinged sputum.  Heparin level now therapeutic on 950 units/hr.  No overt bleeding or complications noted.  Goal of Therapy:  Heparin level 0.3-0.7 units/ml Monitor platelets by anticoagulation protocol: Yes  Plan:  Continue IV heparin to 950 units/hr. Daily heparin level and CBC.  [place] Crane, Roylene Reason, BCCP Clinical Pharmacist  10/11/2021 3:55 PM   [facility] pharmacy phone numbers are listed on [email or web address]

## 2021-10-11 NOTE — Consult Note (Addendum)
[address]       [place] [address]             [phone]        Ahuva L Erichsen  Medical Record #[id number] Date of Birth: 04-Oct-1951  Referring: Dr. Gwenlyn Found, MD Primary Care: No primary care provider on file. Primary Cardiologist:Brian Stanford Breed, MD  Chief Complaint:    Chief Complaint  Patient presents with   Chest Pain, shortness of breath  Reason for consultation: Coronary artery disease, severe aortic stenosis  History of Present Illness:     This is a 70 year old female with a past medical history of hypertension, hyperlipidemia, diabetes mellitus, remote tobacco abuse, bicuspid aortic valve, breast cancer (s/p radiation mostly on right and bilateral mastectomy) who presented to [facility] ED on 10/09/2021 with complaints of chest pain. She states pain radiated to both arms and she had shortness of breath. These symptoms have been happening off and on for the past month. She denies nausea, diaphoresis, syncope. EKG showed subtle ST elevation in aVR with inferior ST depression. Initial Troponin I (high sensitivity) was 45 and max'd to 199. Cardiac catheterization done 12/27/20222 showed ostial RCA with a 75% stenosis, RPAV with a 90% stenosis, and 40% stenosis of ostial LAD to proximal LAD, and previous stent proximal to mid LAD widely patent. Echo done 10/11/2021 showed LVEF 60-65%, LV with no region wall abnormalities, mild MR and MS, mild AI and severe AS with a peak gradient of 60.2 mmHg and aortic valve area by VTI 0.54 cm2. Dr. Cyndia Bent has been consulted for consideration of coronary artery bypass grafting surgery and aortic valve replacement. At the time of my exam, VSS and she denies chest pain or shortness of breath.  Current Activity/ Functional Status: Patient is independent with mobility/ambulation, transfers, ADL's, IADL's.   Zubrod Score: At the time of surgery this patients most appropriate activity status/level should be described  as: _0     0    Normal activity, no symptoms _1     1    Restricted in physical strenuous activity but ambulatory, able to do out light work _2     2    Ambulatory and capable of self care, unable to do work activities, up and about more than 50%  Of the time                            _3     3    Only limited self care, in bed greater than 50% of waking hours _4     4    Completely disabled, no self care, confined to bed or chair _5     5    Moribund  Past Medical History:  Diagnosis Date   Alopecia    Anemia    Bicuspid aortic valve    mild to moderate AS by echo 2022   Carotid stenosis    1-39% bilateral carotid stenosis by dopplers 07/2021   Convalescence after chemotherapy    Coronary arteriosclerosis in native artery 2013   s/p PCI of LAD   DM (diabetes mellitus), type 1 (HCC)    Estrogen receptor negative    History of breast cancer    Hyperglycemia    Hyperlipidemia    Hypertension    Mild dehydration    Mitral regurgitation    mild to moderate by echo 2020   Murmur, heart    Swelling of joint  of both wrists    Tendinitis of left wrist     Past Surgical History:  Procedure Laterality Date   ANKLE SURGERY Bilateral    CATARACT EXTRACTION, BILATERAL     CHOLECYSTECTOMY     CORONARY STENT INTERVENTION     CORONARY/GRAFT ACUTE MI REVASCULARIZATION N/A 10/10/2021   Procedure: CORONARY/GRAFT ACUTE MI REVASCULARIZATION;  Surgeon: Lorretta Harp, MD;  Location: [facility];  Service: Cardiovascular;  Laterality: N/A;   CTR     ELBOW SURGERY     HAND SURGERY     BOTH THUMBS AND MIDDLE FINGERS   LEFT BREAST MASTECTOMY     MASCULAR  REPAIR     PORT-A-CATH REMOVAL     PORTACATH PLACEMENT     RIGHT MODIFIED MASTECTOMY Right    UPPER LIP SURGERY      Social History   Tobacco Use  Smoking Status Former   Years: 3.00   Types: Cigarettes   Quit date: 08/15/1979   Years since quitting: 42.1  Smokeless Tobacco Never  Tobacco Comments   1 pack would last a  week.    Social History   Substance and Sexual Activity  Alcohol Use Never     Allergies  Allergen Reactions   Codeine     Hyper with vomiting   Onion Diarrhea, Nausea And Vomiting and Other (See Comments)    syncope   Reglan [Metoclopramide]     "pulls muscle to side" "feel paralyzed"    Current Facility-Administered Medications  Medication Dose Route Frequency Provider Last Rate Last Admin   0.9 %  sodium chloride infusion  250 mL Intravenous PRN Lorretta Harp, MD       acetaminophen (TYLENOL) tablet 650 mg  650 mg Oral Q4H PRN Lorretta Harp, MD   650 mg at 10/11/21 1009   amLODipine (NORVASC) tablet 2.5 mg  2.5 mg Oral Daily Lorretta Harp, MD   2.5 mg at 10/11/21 1003   aspirin chewable tablet 81 mg  81 mg Oral Daily Lorretta Harp, MD   81 mg at 10/11/21 1006   atorvastatin (LIPITOR) tablet 80 mg  80 mg Oral Daily Lorretta Harp, MD   80 mg at 10/11/21 1004   B-complex with vitamin C tablet 1 tablet  1 tablet Oral Daily Lorretta Harp, MD   1 tablet at 10/11/21 1003   calcium-vitamin D (OSCAL WITH D) 500-5 MG-MCG per tablet 1 tablet  1 tablet Oral Q breakfast Lorretta Harp, MD   1 tablet at 10/11/21 0076   celecoxib (CELEBREX) capsule 200 mg  200 mg Oral Daily Almyra Deforest, PA   200 mg at 10/11/21 1003   Chlorhexidine Gluconate Cloth 2 % PADS 6 each  6 each Topical Daily Lorretta Harp, MD   6 each at 10/10/21 0146   cholecalciferol (VITAMIN D3) tablet 1,000 Units  1,000 Units Oral Daily Almyra Deforest, PA   1,000 Units at 10/11/21 1004   heparin ADULT infusion 100 units/mL (25000 units/268m)  950 Units/hr Intravenous Continuous Carney, JGay Filler RPH   Paused at 10/11/21 0859   insulin aspart (novoLOG) injection 0-5 Units  0-5 Units Subcutaneous QHS MAlmyra Deforest PA       insulin aspart (novoLOG) injection 0-9 Units  0-9 Units Subcutaneous TID WC MAlmyra Deforest PA   3 Units at 10/11/21 0838   insulin aspart (novoLOG) injection 2 Units  2 Units Subcutaneous TID WC  MAlmyra Deforest PA   2 Units at  10/11/21 0839   insulin NPH Human (NOVOLIN N) injection 18 Units  18 Units Subcutaneous BID AC & HS Lorretta Harp, MD   18 Units at 10/11/21 0840   MEDLINE mouth rinse  15 mL Mouth Rinse BID Lorretta Harp, MD   15 mL at 10/10/21 2236   metoprolol succinate (TOPROL-XL) 24 hr tablet 25 mg  25 mg Oral Daily Lorretta Harp, MD   25 mg at 10/11/21 1003   mometasone-formoterol (DULERA) 200-5 MCG/ACT inhaler 2 puff  2 puff Inhalation BID Almyra Deforest, PA   2 puff at 10/11/21 0745   nitroGLYCERIN (NITROSTAT) SL tablet 0.4 mg  0.4 mg Sublingual Q5 Min x 3 PRN Meade Maw, MD   0.4 mg at 10/10/21 2247   ondansetron (ZOFRAN) injection 4 mg  4 mg Intravenous Q6H PRN Lorretta Harp, MD       sodium chloride flush (NS) 0.9 % injection 3 mL  3 mL Intravenous Q12H Lorretta Harp, MD   3 mL at 10/10/21 2236   sodium chloride flush (NS) 0.9 % injection 3 mL  3 mL Intravenous PRN Lorretta Harp, MD       vitamin B-12 (CYANOCOBALAMIN) tablet 1,000 mcg  1,000 mcg Oral Daily Almyra Deforest, PA   1,000 mcg at 10/11/21 1004    Medications Prior to Admission  Medication Sig Dispense Refill Last Dose   acetaminophen (TYLENOL) 500 MG tablet Take 1,000 mg by mouth every 6 (six) hours as needed for moderate pain or headache.   Past Month   amLODipine (NORVASC) 2.5 MG tablet TAKE 1 TABLET EVERY DAY (Patient taking differently: Take 2.5 mg by mouth at bedtime.) 90 tablet 2 10/08/2021   aspirin EC 81 MG tablet Take 81 mg by mouth at bedtime.   10/08/2021   atorvastatin (LIPITOR) 40 MG tablet Take 40 mg by mouth at bedtime.   10/08/2021   Biotin w/ Vitamins C & E (HAIR/SKIN/NAILS PO) Take 1 capsule by mouth daily.   10/09/2021   Calcium Carb-Cholecalciferol (CALCIUM 600 + D PO) Take 1 tablet by mouth daily.   10/09/2021   celecoxib (CELEBREX) 200 MG capsule Take 200 mg by mouth daily.   10/09/2021   cholecalciferol (VITAMIN D) 25 MCG (1000 UNIT) tablet Take 1,000 Units by mouth  daily.   10/09/2021   fexofenadine (ALLEGRA) 180 MG tablet Take 180 mg by mouth daily as needed for allergies.   Past Month   fluticasone (FLONASE) 50 MCG/ACT nasal spray Place into both nostrils as needed.    unk   fluticasone-salmeterol (ADVAIR HFA) 230-21 MCG/ACT inhaler Inhale 2 puffs into the lungs 2 (two) times daily. 1 each 12 10/09/2021   glucose blood test strip 1 each by Other route 4 (four) times daily -  before meals and at bedtime. Use as instructed      insulin NPH Human (NOVOLIN N) 100 UNIT/ML injection Inject 18 Units into the skin 2 (two) times daily before a meal.   10/09/2021   insulin regular (NOVOLIN R) 100 units/mL injection Inject 3-5 Units into the skin See admin instructions. Per sliding scale 3 times daily with meals   10/09/2021   loperamide (IMODIUM) 1 MG/5ML solution Take by mouth as needed for diarrhea or loose stools.   unk   losartan (COZAAR) 50 MG tablet Take 1 tablet (50 mg total) by mouth daily. 90 tablet 3 10/09/2021   metoprolol succinate (TOPROL XL) 25 MG 24 hr tablet Take 1 tablet (25 mg  total) by mouth daily. (Patient taking differently: Take 25 mg by mouth at bedtime.) 90 tablet 3 10/08/2021 at 2000   nitroGLYCERIN (NITROSTAT) 0.4 MG SL tablet Place 0.4 mg under the tongue every 5 (five) minutes as needed for chest pain.   unk   vitamin B-12 (CYANOCOBALAMIN) 1000 MCG tablet Take 1,000 mcg by mouth daily.   10/09/2021   Efinaconazole 10 % SOLN Apply 1 drop topically daily. (Patient not taking: Reported on 10/09/2021) 4 mL 11 Not Taking    Family History  Problem Relation Age of Onset   Heart attack Mother    CAD Father        CABG   Diabetes Father    Stroke Father    Colon cancer Father        6s   Diabetes Brother    Diabetes Paternal Grandfather    Heart attack Paternal Grandfather    Breast cancer Maternal Aunt        39s    Review of Systems:   ROS   Cardiac Review of Systems: Y or  [ N   ]= no  Chest Pain [  Y-upon admission  ]   Resting SOB [N] Exertional SOB  [  Y]     Pedal Edema [  N ]    Syncope  [  N]  Presyncope [  N ]  General Review of Systems: [Y] = yes [ N ]=no Constitional: nausea [ N ]; night sweats [ N ]; fever [ N ]; or chills [  N]                                                                Eye : blurred vision [  ]; diplopia [   ]; vision changes [  ];  Amaurosis fugax[ N]; Resp: cough [ N ];  wheezing[ N ];  hemoptysis[ N ]; s GI:  vomiting[ N ];  melena[N  ];  hematochezia Aqua.Slicker  ];  GU: hematuria[ N ];                Skin: [place], swelling[ N ];,   Heme/Lymph:  anemia[ Y ];  Neuro: TIA[  N];  headaches[  ];  stroke[ N ];  vertigo[N  ];  seizures[  N];     Endocrine: diabetes[ Y ];  thyroid dysfunction[  N];            Last dental visit: November 2022 (she has several crowns, several teeth have been pulled      Physical Exam: BP (!) 124/53    Pulse 99    Temp 99.2 F (37.3 C) (Axillary)    Resp 19    Ht 5' 1.25" (1.556 m)    Wt 70.7 kg    SpO2 96%    BMI 29.21 kg/m    General appearance: alert, cooperative, and no distress Head: Normocephalic, without obvious abnormality, atraumatic Neck: no adenopathy, no JVD, and supple, symmetrical, trachea midline Resp: clear to auscultation bilaterally Cardio:  RRR, grade III/VI murmur heard best along sternal border and radiating to both carotid arteries GI:  Soft, mildly obese, non tender Extremities:  No LE edema. Palpable pulses. Feet warm Neurologic: Grossly normal  Diagnostic Studies & Laboratory data:  Recent Radiology Findings:   DG Chest 2 View  Result Date: 10/09/2021 CLINICAL DATA:  Chest pain, 1 week of shortness of breath. EXAM: CHEST - 2 VIEW COMPARISON:  Chest CT January 15, 2021 and chest radiograph November 22, 2020. FINDINGS: The heart size and mediastinal contours are within normal limits. Aortic atherosclerosis. Linear opacity in the right apex appears stable likely reflecting post radiation change. No new focal airspace  consolidation. No visible pleural effusion or pneumothorax. No acute osseous abnormality. Cholecystectomy clips. IMPRESSION: 1. No active cardiopulmonary disease. 2. Linear opacity in the right apex appears stable likely reflecting post treatment scarring. Electronically Signed   By: Dahlia Bailiff M.D.   On: 10/09/2021 19:10   CARDIAC CATHETERIZATION  Result Date: 10/10/2021 Images from the original result were not included.   Ost RCA lesion is 75% stenosed.   RPAV lesion is 90% stenosed.   Ost LAD to Prox LAD lesion is 40% stenosed.   Previously placed Prox LAD to Mid LAD stent (unknown type) is  widely patent. Rayna Brenner Greggs is a 70 y.o. female  [id number] LOCATION:  FACILITY: [facility] PHYSICIAN: Quay Burow, M.D. 09-Oct-1951 DATE OF PROCEDURE:  10/10/2021 DATE OF DISCHARGE: CARDIAC CATHETERIZATION History obtained from chart review.  70 year old female with a history of hypertension, hyperlipidemia, diabetes and prior history of LAD stenting in 2013.  She does have mild to moderate aortic stenosis.  She has had several weeks of chest pain when lying down.  She had worse pain tonight and came to the emergency room where she had a millimeter of ST segment elevation in lead V1 and ST segment depression in the inferior lateral leads.  She was brought to the Cath Lab for urgent cath and potential intervention.   Ms. Poirier  has a widely patent LAD stent and moderate segmental disease in the proximal LAD.  There is no other disease in the left system.  She does have a 75% true ostial RCA with mild damping of a 5 [place] catheter.  I reviewed her angiograms with Dr. De Nurse who agrees that there is no culprit lesion in the LAD territory.  In addition, her LVEDP was only 8.  We will treat her medically at this time, cycle her enzymes and heparinize her for the time being.  The radial sheath was removed and a TR band was placed on the right wrist to achieve patent hemostasis.  The patient left lab in stable condition.  Quay Burow. MD, [facility] 10/10/2021 1:19 AM   Diagnostic Dominance: Right Intervention  DG CHEST PORT 1 VIEW  Result Date: 10/10/2021 CLINICAL DATA:  Hypoxia EXAM: PORTABLE CHEST 1 VIEW COMPARISON:  10/09/2021, CT chest 01/15/2021, chest x-ray 12/02/2020 FINDINGS: fibrosis at the right apex. No acute consolidation or pleural effusion. Mild diffuse bronchitic changes. Stable cardiomediastinal silhouette with aortic atherosclerosis. IMPRESSION: No active disease. Mild diffuse bronchitic changes with probable fibrosis at right apex Electronically Signed   By: Donavan Foil M.D.   On: 10/10/2021 23:50   ECHOCARDIOGRAM LIMITED  Result Date: 10/10/2021    ECHOCARDIOGRAM LIMITED REPORT   Patient Name:   MANDEEP FERCH Date of Exam: 10/10/2021 Medical Rec #:  [id number]      Height:       61.3 in Accession #:    [id number]     Weight:       155.9 lb Date of Birth:  06-Apr-1951     BSA:          1.704 m Patient Age:  70 years       BP:           137/56 mmHg Patient Gender: F              HR:           83 bpm. Exam Location:  Inpatient Procedure: Limited Echo, 3D Echo, Cardiac Doppler, Color Doppler and Strain            Analysis Indications:    CAD Native Vessel I25.10  History:        Patient has prior history of Echocardiogram examinations, most                 recent 11/06/2020. CAD, Carotid Disease; Aortic Valve Disease and                 Mitral Valve Disease. Past history of breast cancer.  Sonographer:    Darlina Sicilian RDCS Referring Phys: [place]  1. Normal LV function; calcified aortic valve with severe AS (mean gradient 40 mmHg; DI 0.21); mac with mild MS (mean gradient 5 mmHg) and mild MR.  2. Left ventricular ejection fraction, by estimation, is 60 to 65%. The left ventricle has normal function. The left ventricle has no regional wall motion abnormalities. There is mild left ventricular hypertrophy of the basal-septal segment. Left ventricular diastolic parameters are  consistent with Grade I diastolic dysfunction (impaired relaxation). Elevated left atrial pressure. The average left ventricular global longitudinal strain is -18.4 %. The global longitudinal strain is normal.  3. Right ventricular systolic function is normal. The right ventricular size is normal.  4. The mitral valve is normal in structure. Mild mitral valve regurgitation. Mild mitral stenosis. Moderate mitral annular calcification.  5. The aortic valve is calcified. Aortic valve regurgitation is mild. Severe aortic valve stenosis.  6. The inferior vena cava is normal in size with greater than 50% respiratory variability, suggesting right atrial pressure of 3 mmHg. FINDINGS  Left Ventricle: Left ventricular ejection fraction, by estimation, is 60 to 65%. The left ventricle has normal function. The left ventricle has no regional wall motion abnormalities. The average left ventricular global longitudinal strain is -18.4 %. The global longitudinal strain is normal. The left ventricular internal cavity size was normal in size. There is mild left ventricular hypertrophy of the basal-septal segment. Left ventricular diastolic parameters are consistent with Grade I diastolic dysfunction (impaired relaxation). Elevated left atrial pressure. Right Ventricle: The right ventricular size is normal. Right ventricular systolic function is normal. Left Atrium: Left atrial size was normal in size. Right Atrium: Right atrial size was normal in size. Pericardium: There is no evidence of pericardial effusion. Mitral Valve: The mitral valve is normal in structure. Moderate mitral annular calcification. Mild mitral valve regurgitation. Mild mitral valve stenosis. MV peak gradient, 8.2 mmHg. The mean mitral valve gradient is 5.0 mmHg. Tricuspid Valve: The tricuspid valve is normal in structure. Tricuspid valve regurgitation is trivial. No evidence of tricuspid stenosis. Aortic Valve: The aortic valve is calcified. Aortic valve  regurgitation is mild. Severe aortic stenosis is present. Aortic valve mean gradient measures 40.0 mmHg. Aortic valve peak gradient measures 60.2 mmHg. Aortic valve area, by VTI measures 0.54 cm. Pulmonic Valve: The pulmonic valve was not well visualized. Pulmonic valve regurgitation is not visualized. No evidence of pulmonic stenosis. Aorta: The aortic root is normal in size and structure. Venous: The inferior vena cava is normal in size with greater than 50% respiratory variability, suggesting right  atrial pressure of 3 mmHg. IAS/Shunts: No atrial level shunt detected by color flow Doppler. Additional Comments: Normal LV function; calcified aortic valve with severe AS (mean gradient 40 mmHg; DI 0.21); mac with mild MS (mean gradient 5 mmHg) and mild MR. LEFT VENTRICLE PLAX 2D LVIDd:         4.25 cm   Diastology LVIDs:         3.60 cm   LV e' medial:    4.74 cm/s LV PW:         0.80 cm   LV E/e' medial:  27.3 LV IVS:        1.30 cm   LV e' lateral:   2.95 cm/s LVOT diam:     1.80 cm   LV E/e' lateral: 43.9 LV SV:         52 LV SV Index:   31        2D Longitudinal Strain LVOT Area:     2.54 cm  2D Strain GLS Avg:     -18.4 %                           3D Volume EF:                          3D EF:        60 %                          LV EDV:       116 ml                          LV ESV:       47 ml                          LV SV:        69 ml LEFT ATRIUM         Index LA diam:    4.40 cm 2.58 cm/m  AORTIC VALVE AV Area (Vmax):    0.61 cm AV Area (Vmean):   0.52 cm AV Area (VTI):     0.54 cm AV Vmax:           387.80 cm/s AV Vmean:          303.500 cm/s AV VTI:            0.968 m AV Peak Grad:      60.2 mmHg AV Mean Grad:      40.0 mmHg LVOT Vmax:         92.90 cm/s LVOT Vmean:        61.700 cm/s LVOT VTI:          0.205 m LVOT/AV VTI ratio: 0.21  AORTA Ao Asc diam: 2.50 cm MITRAL VALVE MV Area (PHT): 3.95 cm     SHUNTS MV Area VTI:   1.46 cm     Systemic VTI:  0.20 m MV Peak grad:  8.2 mmHg     Systemic Diam:  1.80 cm MV Mean grad:  5.0 mmHg MV Vmax:       1.43 m/s MV Vmean:      107.0 cm/s MV Decel Time: 192 msec MV E velocity: 129.50 cm/s MV A velocity: 142.00 cm/s MV E/A ratio:  0.91 Kirk Ruths MD Electronically  signed by Kirk Ruths MD Signature Date/Time: 10/10/2021/12:33:40 PM    Final      I have independently reviewed the above radiologic studies and discussed with the patient   Recent Lab Findings: Lab Results  Component Value Date   WBC 9.2 10/11/2021   HGB 8.9 (L) 10/11/2021   HCT 26.5 (L) 10/11/2021   PLT 159 10/11/2021   GLUCOSE 215 (H) 10/11/2021   CHOL 99 10/10/2021   TRIG 122 10/10/2021   HDL 39 (L) 10/10/2021   LDLCALC 36 10/10/2021   ALT 16 07/11/2021   AST 28 07/11/2021   NA 130 (L) 10/11/2021   K 4.1 10/11/2021   CL 98 10/11/2021   CREATININE 1.20 (H) 10/11/2021   BUN 30 (H) 10/11/2021   CO2 24 10/11/2021   TSH 3.484 11/24/2020   INR 1.1 10/10/2021   HGBA1C 7.8 (H) 10/09/2021   Assessment / Plan:   S/p STEMI, coronary artery disease-on Heparin drip. Will stop on call to OR Mild MR/MS, severe aortic stenosis-per echo, severe AS, peak gradient 60.2 mmHg. Dr. Cyndia Bent to review cardiac catheterization and echo. Dr. Cyndia Bent to decide CABG/AVR vs PCI/TAVR. History of hypertension-on Amlodipine 2.5 mg daily and Toprol XL 25 mg daily 4. History of diabetes mellitus Type I-on Insulin. HGA1C 7.8. She will need close medical follow up with medical doctor after discharge 5. History of hyperlipidemia-on Atorvastatin 80 mg daily 6. History of remote tobacco abuse 7. Anemia-H and H this am 8.9 and 26.5   I  spent 15 minutes counseling the patient face to face.   Lars Pinks PA-C 10/11/2021 10:13 AM    Chart reviewed, patient examined, agree with above. The patient is a 70 year old woman with a hx of CAD s/p LAD stenting in past, breast cancer s/p bilateral mod rad mastectomy and right chest radiation 8+ years ago who presents with a 2-3 wk hx of progressive  exertional SOB and chest discomfort as well as orthopnea associated with chest pain radiating down both arms to the hands. She presented with unstable angina and HS troponins of 15>45>154>199. BNP 511. Cath shows high grade ostial RCA stenosis, 90% distal RPAV stenosis and what looks like it may be a significant proximal LAD stenosis just beyond the septal perforator. The previous LAD stent is patent.  There is also some stenosis of the  proximal Ramus.  Her echo shows severe AS with a mean gradient of 40 mm Hg. AVA 0.54 cm2. LVEF 60-65%. Mild MS with mean gradient 5, peak 8.2. She had a CT in 01/2021 for chronic SOB which showed some radiation changes in the right apex. The ascending aorta had significant calcification extending from the root up to the arch particularly medially and posteriorly.  This calcification is likely due to her radiation therapy which may also cause significant pericardial adhesions and render the internal mammary arteries unsuitable for use as bypass conduits.  Radiation may also impede wound healing.  I reviewed the case with Dr. Burt Knack and he felt that the coronary lesions may be suitable for PCI.  Open surgical AVR/CABG would be higher risk due to all this and may require replacement of her ascending aorta and aortic root which would significantly increase the risk of surgery.  I think the best course of action is to complete a TAVR CT scan work-up to decide if her valve anatomy is suitable for TAVR and if so then to consider PCI.  I discussed all this with the patient and her daughter at  the bedside.  Gaye Pollack, MD

## 2021-10-11 NOTE — Progress Notes (Addendum)
Progress Note  Patient Name: Molly Hill Date of Encounter: 10/12/2021  [facility] Cardiologist: Kirk Ruths, MD   Subjective   Spiked fever to 102 yesterday with blood cultures positive for U.Coli. Started on cefepime later transitioned to ceftriaxone. Patient denies any urinary or GI symptoms.   Seen by CV surgery. Deemed high risk for CABG/AVR due to history of chest XRT for breast cancer. Will be seen by structural team for consideration of PCI/TAVR.  Today, the patient feels more swollen. Wt up 2-4lbs (reported wt prior to admission was 146lbs). No chest pain but has dyspnea on minimal exertion  Inpatient Medications    Scheduled Meds:  amLODipine  2.5 mg Oral Daily   aspirin  81 mg Oral Daily   atorvastatin  80 mg Oral Daily   B-complex with vitamin C  1 tablet Oral Daily   calcium-vitamin D  1 tablet Oral Q breakfast   Chlorhexidine Gluconate Cloth  6 each Topical Daily   cholecalciferol  1,000 Units Oral Daily   docusate sodium  200 mg Oral Daily   furosemide  40 mg Intravenous Once   heparin-papaverine-plasmalyte irrigation   Irrigation To OR   insulin aspart  0-5 Units Subcutaneous QHS   insulin aspart  0-9 Units Subcutaneous TID WC   insulin aspart  2 Units Subcutaneous TID WC   insulin NPH Human  18 Units Subcutaneous BID AC & HS   insulin   Intravenous To OR   mouth rinse  15 mL Mouth Rinse BID   metoprolol succinate  25 mg Oral Daily   mometasone-formoterol  2 puff Inhalation BID   phenylephrine  30-200 mcg/min Intravenous To OR   polyethylene glycol  17 g Oral BID   sodium chloride flush  3 mL Intravenous Q12H   vitamin B-12  1,000 mcg Oral Daily   Continuous Infusions:  sodium chloride Stopped (10/12/21 0607)   cefTRIAXone (ROCEPHIN)  IV Stopped (10/12/21 0434)   heparin 950 Units/hr (10/12/21 0700)   PRN Meds: sodium chloride, acetaminophen, bisacodyl, nitroGLYCERIN, ondansetron (ZOFRAN) IV, sodium chloride, sodium chloride flush   Vital  Signs    Vitals:   10/12/21 0500 10/12/21 0600 10/12/21 0700 10/12/21 0711  BP: (!) 108/44 (!) 104/47 (!) 118/44   Pulse: 85 95 75   Resp: _0 Temp:  98.6 F (37 C)    TempSrc:  Oral    SpO2: 95% 93% 100% 96%  Weight:      Height:        Intake/Output Summary (Last 24 hours) at 10/12/2021 0813 Last data filed at 10/12/2021 0700 Gross per 24 hour  Intake 845.8 ml  Output 975 ml  Net -129.2 ml   Last 3 Weights 10/12/2021 10/10/2021 10/09/2021  Weight (lbs) 150 lb 12.7 oz 155 lb 13.8 oz 148 lb  Weight (kg) 68.4 kg 70.7 kg 67.132 kg      Telemetry    NSR - Personally Reviewed  ECG    No new tracing - Personally Reviewed  Physical Exam   GEN: No acute distress.   Neck: No JVD Cardiac: RRR, 3/6 late peaking systolic murmur  Respiratory: Crackles at the bases bilaterally.  GI: Soft, nontender, non-distended  MS: Trace edema in the hands; no LE edema. Warm Neuro:  Nonfocal  Psych: Normal affect   Labs    High Sensitivity Troponin:   Recent Labs  Lab 10/09/21 1837 10/09/21 2209 10/10/21 0605 10/10/21 1015 10/11/21 0022  TROPONINIHS 15 45* 154*  199* 199*     Chemistry Recent Labs  Lab 10/09/21 1837 10/10/21 0605 10/11/21 0002 10/11/21 0022  NA 131* 134* 131* 130*  K 4.3 4.0 4.2 4.1  CL 97* 100  --  98  CO2 26 24  --  24  GLUCOSE 521* 170*  --  215*  BUN 31* 29*  --  30*  CREATININE 1.45* 1.14*  --  1.20*  CALCIUM 8.7* 8.2*  --  8.3*  MG  --   --   --  1.6*  GFRNONAA 39* 51*  --  48*  ANIONGAP 8 10  --  8    Lipids  Recent Labs  Lab 10/10/21 0605  CHOL 99  TRIG 122  HDL 39*  LDLCALC 36  CHOLHDL 2.5    Hematology Recent Labs  Lab 10/10/21 0605 10/11/21 0002 10/11/21 0022 10/12/21 0124  WBC 8.7  --  9.2 6.7  RBC 2.90*  --  2.83* 2.80*  HGB 9.0* 8.5* 8.9* 8.7*  HCT 27.7* 25.0* 26.5* 26.1*  MCV 95.5  --  93.6 93.2  MCH 31.0  --  31.4 31.1  MCHC 32.5  --  33.6 33.3  RDW 13.8  --  13.6 13.6  PLT 164  --  159 145*   Thyroid  No results for input(s): TSH, FREET4 in the last 168 hours.  BNP Recent Labs  Lab 10/09/21 2210  BNP 511.4*    DDimer No results for input(s): DDIMER in the last 168 hours.   Radiology    DG CHEST PORT 1 VIEW  Result Date: 10/10/2021 CLINICAL DATA:  Hypoxia EXAM: PORTABLE CHEST 1 VIEW COMPARISON:  10/09/2021, CT chest 01/15/2021, chest x-ray 12/02/2020 FINDINGS: fibrosis at the right apex. No acute consolidation or pleural effusion. Mild diffuse bronchitic changes. Stable cardiomediastinal silhouette with aortic atherosclerosis. IMPRESSION: No active disease. Mild diffuse bronchitic changes with probable fibrosis at right apex Electronically Signed   By: Donavan Foil M.D.   On: 10/10/2021 23:50   VAS US DOPPLER PRE CABG  Result Date: 10/11/2021 PREOPERATIVE VASCULAR EVALUATION Patient Name:  Molly Hill  Date of Exam:   10/11/2021 Medical Rec #: [id number]       Accession #:    [id number] Date of Birth: 1951/06/21      Patient Gender: F Patient Age:   70 years Exam Location:  [facility] Procedure:      VAS US DOPPLER PRE CABG Referring Phys: Gilford Raid --------------------------------------------------------------------------------  Indications:      Pre-CABG. Risk Factors:     Hypertension, hyperlipidemia, Diabetes. Comparison Study: carotid duplex done 07/25/21 Performing Technologist: Archie Patten RVS  Examination Guidelines: A complete evaluation includes B-mode imaging, spectral Doppler, color Doppler, and power Doppler as needed of all accessible portions of each vessel. Bilateral testing is considered an integral part of a complete examination. Limited examinations for reoccurring indications may be performed as noted.  ABI Findings: +---------+------------------+-----+---------+--------+  Right     Rt Pressure (mmHg) Index Waveform  Comment   +---------+------------------+-----+---------+--------+  Brachial  130                      triphasic            +---------+------------------+-----+---------+--------+  PTA       212                1.63  triphasic           +---------+------------------+-----+---------+--------+  DP  255                1.96  triphasic           +---------+------------------+-----+---------+--------+  Great Toe 104                0.80  Normal              +---------+------------------+-----+---------+--------+ +---------+------------------+-----+---------+-------+  Left      Lt Pressure (mmHg) Index Waveform  Comment  +---------+------------------+-----+---------+-------+  Brachial  118                      triphasic          +---------+------------------+-----+---------+-------+  PTA       255                1.96  triphasic          +---------+------------------+-----+---------+-------+  DP        195                1.50  triphasic          +---------+------------------+-----+---------+-------+  Great Toe 112                0.86  Normal             +---------+------------------+-----+---------+-------+ +-------+---------------+----------------+  ABI/TBI Today's ABI/TBI Previous ABI/TBI  +-------+---------------+----------------+  Right   0.96            0.80              +-------+---------------+----------------+  Left    0.96            0.86              +-------+---------------+----------------+  Right Doppler Findings: +--------+--------+-----+---------+--------+  Site     Pressure Index Doppler   Comments  +--------+--------+-----+---------+--------+  Brachial 130            triphasic           +--------+--------+-----+---------+--------+  Radial                  triphasic           +--------+--------+-----+---------+--------+  Ulnar                   triphasic           +--------+--------+-----+---------+--------+  Left Doppler Findings: +--------+--------+-----+---------+--------+  Site     Pressure Index Doppler   Comments  +--------+--------+-----+---------+--------+  Brachial 118            triphasic            +--------+--------+-----+---------+--------+  Radial                  triphasic           +--------+--------+-----+---------+--------+  Ulnar                   triphasic           +--------+--------+-----+---------+--------+  Right ABI: Resting right ankle-brachial index indicates noncompressible right lower extremity arteries. The right toe-brachial index is normal. Left ABI: Resting left ankle-brachial index indicates noncompressible left lower extremity arteries. The left toe-brachial index is normal. Right Upper Extremity: Doppler waveforms remain within normal limits with right radial compression. Doppler waveforms decrease 50% with right ulnar compression. Left Upper Extremity: Doppler waveforms decrease 50% with left radial compression. Doppler waveforms remain within normal limits with left ulnar compression.  Electronically signed  by Harold Barban MD on 10/11/2021 at 6:43:09 PM.    Final    ECHOCARDIOGRAM LIMITED  Result Date: 10/10/2021    ECHOCARDIOGRAM LIMITED REPORT   Patient Name:   JADELYNN BOYLAN Date of Exam: 10/10/2021 Medical Rec #:  [id number]      Height:       61.3 in Accession #:    [id number]     Weight:       155.9 lb Date of Birth:  11/09/1950     BSA:          1.704 m Patient Age:    8 years       BP:           137/56 mmHg Patient Gender: F              HR:           83 bpm. Exam Location:  Inpatient Procedure: Limited Echo, 3D Echo, Cardiac Doppler, Color Doppler and Strain            Analysis Indications:    CAD Native Vessel I25.10  History:        Patient has prior history of Echocardiogram examinations, most                 recent 11/06/2020. CAD, Carotid Disease; Aortic Valve Disease and                 Mitral Valve Disease. Past history of breast cancer.  Sonographer:    Darlina Sicilian RDCS Referring Phys: [place]  1. Normal LV function; calcified aortic valve with severe AS (mean gradient 40 mmHg; DI 0.21); mac with mild MS (mean gradient 5 mmHg) and  mild MR.  2. Left ventricular ejection fraction, by estimation, is 60 to 65%. The left ventricle has normal function. The left ventricle has no regional wall motion abnormalities. There is mild left ventricular hypertrophy of the basal-septal segment. Left ventricular diastolic parameters are consistent with Grade I diastolic dysfunction (impaired relaxation). Elevated left atrial pressure. The average left ventricular global longitudinal strain is -18.4 %. The global longitudinal strain is normal.  3. Right ventricular systolic function is normal. The right ventricular size is normal.  4. The mitral valve is normal in structure. Mild mitral valve regurgitation. Mild mitral stenosis. Moderate mitral annular calcification.  5. The aortic valve is calcified. Aortic valve regurgitation is mild. Severe aortic valve stenosis.  6. The inferior vena cava is normal in size with greater than 50% respiratory variability, suggesting right atrial pressure of 3 mmHg. FINDINGS  Left Ventricle: Left ventricular ejection fraction, by estimation, is 60 to 65%. The left ventricle has normal function. The left ventricle has no regional wall motion abnormalities. The average left ventricular global longitudinal strain is -18.4 %. The global longitudinal strain is normal. The left ventricular internal cavity size was normal in size. There is mild left ventricular hypertrophy of the basal-septal segment. Left ventricular diastolic parameters are consistent with Grade I diastolic dysfunction (impaired relaxation). Elevated left atrial pressure. Right Ventricle: The right ventricular size is normal. Right ventricular systolic function is normal. Left Atrium: Left atrial size was normal in size. Right Atrium: Right atrial size was normal in size. Pericardium: There is no evidence of pericardial effusion. Mitral Valve: The mitral valve is normal in structure. Moderate mitral annular calcification. Mild mitral valve regurgitation. Mild mitral  valve stenosis. MV peak gradient, 8.2 mmHg. The mean mitral valve gradient is 5.0 mmHg.  Tricuspid Valve: The tricuspid valve is normal in structure. Tricuspid valve regurgitation is trivial. No evidence of tricuspid stenosis. Aortic Valve: The aortic valve is calcified. Aortic valve regurgitation is mild. Severe aortic stenosis is present. Aortic valve mean gradient measures 40.0 mmHg. Aortic valve peak gradient measures 60.2 mmHg. Aortic valve area, by VTI measures 0.54 cm. Pulmonic Valve: The pulmonic valve was not well visualized. Pulmonic valve regurgitation is not visualized. No evidence of pulmonic stenosis. Aorta: The aortic root is normal in size and structure. Venous: The inferior vena cava is normal in size with greater than 50% respiratory variability, suggesting right atrial pressure of 3 mmHg. IAS/Shunts: No atrial level shunt detected by color flow Doppler. Additional Comments: Normal LV function; calcified aortic valve with severe AS (mean gradient 40 mmHg; DI 0.21); mac with mild MS (mean gradient 5 mmHg) and mild MR. LEFT VENTRICLE PLAX 2D LVIDd:         4.25 cm   Diastology LVIDs:         3.60 cm   LV e' medial:    4.74 cm/s LV PW:         0.80 cm   LV E/e' medial:  27.3 LV IVS:        1.30 cm   LV e' lateral:   2.95 cm/s LVOT diam:     1.80 cm   LV E/e' lateral: 43.9 LV SV:         52 LV SV Index:   31        2D Longitudinal Strain LVOT Area:     2.54 cm  2D Strain GLS Avg:     -18.4 %                           3D Volume EF:                          3D EF:        60 %                          LV EDV:       116 ml                          LV ESV:       47 ml                          LV SV:        69 ml LEFT ATRIUM         Index LA diam:    4.40 cm 2.58 cm/m  AORTIC VALVE AV Area (Vmax):    0.61 cm AV Area (Vmean):   0.52 cm AV Area (VTI):     0.54 cm AV Vmax:           387.80 cm/s AV Vmean:          303.500 cm/s AV VTI:            0.968 m AV Peak Grad:      60.2 mmHg AV Mean Grad:      40.0  mmHg LVOT Vmax:         92.90 cm/s LVOT Vmean:        61.700 cm/s LVOT VTI:  0.205 m LVOT/AV VTI ratio: 0.21  AORTA Ao Asc diam: 2.50 cm MITRAL VALVE MV Area (PHT): 3.95 cm     SHUNTS MV Area VTI:   1.46 cm     Systemic VTI:  0.20 m MV Peak grad:  8.2 mmHg     Systemic Diam: 1.80 cm MV Mean grad:  5.0 mmHg MV Vmax:       1.43 m/s MV Vmean:      107.0 cm/s MV Decel Time: 192 msec MV E velocity: 129.50 cm/s MV A velocity: 142.00 cm/s MV E/A ratio:  0.91 Kirk Ruths MD Electronically signed by Kirk Ruths MD Signature Date/Time: 10/10/2021/12:33:40 PM    Final     Cardiac Studies   TTE 10/10/21: IMPRESSIONS   1. Normal LV function; calcified aortic valve with severe AS (mean  gradient 40 mmHg; DI 0.21); mac with mild MS (mean gradient 5 mmHg) and  mild MR.   2. Left ventricular ejection fraction, by estimation, is 60 to 65%. The  left ventricle has normal function. The left ventricle has no regional  wall motion abnormalities. There is mild left ventricular hypertrophy of  the basal-septal segment. Left  ventricular diastolic parameters are consistent with Grade I diastolic  dysfunction (impaired relaxation). Elevated left atrial pressure. The  average left ventricular global longitudinal strain is -18.4 %. The global  longitudinal strain is normal.   3. Right ventricular systolic function is normal. The right ventricular  size is normal.   4. The mitral valve is normal in structure. Mild mitral valve  regurgitation. Mild mitral stenosis. Moderate mitral annular  calcification.   5. The aortic valve is calcified. Aortic valve regurgitation is mild.  Severe aortic valve stenosis.   6. The inferior vena cava is normal in size with greater than 50%  respiratory variability, suggesting right atrial pressure of 3 mmHg.   Cath 10/10/21:   Ost RCA lesion is 75% stenosed.   RPAV lesion is 90% stenosed.   Ost LAD to Prox LAD lesion is 40% stenosed.   Previously placed Prox LAD to  Mid LAD stent (unknown type) is  widely patent.  Patient Profile     70 y.o. female with past medical history of coronary artery disease status post PCI of the LAD 2013, mild to moderate aortic stenosis, diabetes mellitus, hypertension, hyperlipidemia, breast cancer admitted with chest pain and dyspnea.  Electrocardiogram showed diffuse ST depression. Cardiac catheterization revealed 75% ostial right coronary artery, 90% RP AV and 40% ostial to proximal LAD; left ventricular end-diastolic pressure 8 mmHg. TTE with normal EF 60-65%. severe AS with mean gradient 12mHg, DI 0.21. Now being worked up for possible PCI/TAVR vs CABG/AVR (high risk surgery candidate due to history of XRT). Course has been complicated by E. Coli bacteremia now on CTX.   Assessment & Plan    #NSTEMI: #Multivessel CAD: Patient presented with progressive chest pain consistent with classic angina. Cardiac catheterization 12/26 revealed 75% ostial right coronary artery, 90% RP AV and 40% ostial to proximal LAD; left ventricular end-diastolic pressure 8 mmHg. Was recommended for medical therapy, however, patient continued to have chest pain with minimal movement and intermittently at rest. TTE with LVEF 60-65% with severe aortic stenosis. Re-evaluation of cath films with interventional team suggestive that ostial RCA and prox LAD lesions likely hemodynamically significant. Seen by CV surgery and deemed high risk for CABG/AVR due to history of chest XRT. Will be evaluated by Structural team for consideration of PCI/TAVR. -Structural heart team to see  for consideration of PCI/TAVR as patient deemed high surgical risk given history of chest XRT -Plan for CT TAVR today -Continue heparin gtt given recurrent episodes of chest pain while at rest -Continue ASA 61m daily -Continue lipitor 835mdaily -Continue metoprolol 2587maily -Dose lasix 31m36mday as volume overloaded on exam  #Severe Aortic Stenosis: TTE 12/26 with EF 60-65%,  AVA 0.54, mean gradient 31mm12mDI 0.21. Determined to be high risk surgical candidate due to history of XRT to the chest for breast cancer. Now awaiting evaluation by structural heart team. -Plan for CT TAVR protocol -CV surgery and structural heart teams consulted; appreciate recommendations -Deemed high risk for CABG/AVR due to history of chest XRT for breast cancer  #E.Coli Bacteremia: #Fevers: Patient with fever to 102 with blood culture from 12/27 positive for E.Coli. No urinary symptoms or GI symptoms except mild constipation. Started on ceftriaxone and ID consulted. -ID consulted, appreciate recommendations -May need TEE although GNR less likely to cause endocarditis -Continue CTX -Surveillance blood cultures  #Acute on Chronic Diastolic Heart Failure Exacerbation: LVEDP on cath 8mmHg46mowever, appears volume up today with ~2-4lbs weight gain. Dry wt per patient ~146lbs.  -Give lasix 31mg I65mday and monitor response -Need to be judicious with diuresis given severe AS as patient preload dependent -Continue metoprolol 25mg da41m-Will add spiro/SGLT2i as able post-procedure -Low Na Diet -Monitor I/Os and daily weights  #Anemia: Hemoglobin 8.7. No signs or symptoms of bleeding while on heparin gtt.  -Continue to monitor with daily CBC  #HTN: Controlled.  -Continue amlodipine 2.5mg dail42mContinue metoprolol 25mg XL d83m  #HLD: -Continue lipitor 80mg daily10mMII: -ISS  #Constipation: -Continue colace -Start miralax -Bisacodyl prn   For questions or updates, please contact [facility] [email or web address] for contact info under        Signed, Ozzie Knobel E PFreada Bergeron/2022, 8:13 AM

## 2021-10-12 ENCOUNTER — Inpatient Hospital Stay (HOSPITAL_COMMUNITY): Payer: Medicare HMO

## 2021-10-12 ENCOUNTER — Other Ambulatory Visit (HOSPITAL_COMMUNITY): Payer: Medicare HMO

## 2021-10-12 ENCOUNTER — Encounter (HOSPITAL_COMMUNITY)
Admission: EM | Disposition: A | Discharge status: Home / Self Care | Payer: Medicare HMO | Source: Home / Self Care | Attending: Cardiovascular Disease

## 2021-10-12 DIAGNOSIS — I350 Nonrheumatic aortic (valve) stenosis: Secondary | ICD-10-CM

## 2021-10-12 DIAGNOSIS — A4151 Sepsis due to Escherichia coli [E. coli]: Secondary | ICD-10-CM

## 2021-10-12 DIAGNOSIS — R7881 Bacteremia: Secondary | ICD-10-CM

## 2021-10-12 DIAGNOSIS — B9620 Unspecified Escherichia coli [E. coli] as the cause of diseases classified elsewhere: Secondary | ICD-10-CM

## 2021-10-12 LAB — GLUCOSE, CAPILLARY
Glucose-Capillary: 222 mg/dL — ABNORMAL HIGH (ref 70–99)
Glucose-Capillary: 230 mg/dL — ABNORMAL HIGH (ref 70–99)
Glucose-Capillary: 232 mg/dL — ABNORMAL HIGH (ref 70–99)
Glucose-Capillary: 270 mg/dL — ABNORMAL HIGH (ref 70–99)
Glucose-Capillary: 296 mg/dL — ABNORMAL HIGH (ref 70–99)

## 2021-10-12 LAB — BASIC METABOLIC PANEL
Anion gap: 11 (ref 5–15)
BUN: 28 mg/dL — ABNORMAL HIGH (ref 8–23)
CO2: 25 mmol/L (ref 22–32)
Calcium: 8.2 mg/dL — ABNORMAL LOW (ref 8.9–10.3)
Chloride: 96 mmol/L — ABNORMAL LOW (ref 98–111)
Creatinine, Ser: 1.18 mg/dL — ABNORMAL HIGH (ref 0.44–1.00)
GFR, Estimated: 49 mL/min — ABNORMAL LOW (ref >60)
Glucose, Bld: 268 mg/dL — ABNORMAL HIGH (ref 70–99)
Potassium: 3.6 mmol/L (ref 3.5–5.1)
Sodium: 132 mmol/L — ABNORMAL LOW (ref 135–145)

## 2021-10-12 LAB — HEPATIC FUNCTION PANEL
ALT: 31 U/L (ref 0–44)
AST: 38 U/L (ref 15–41)
Albumin: 2.5 g/dL — ABNORMAL LOW (ref 3.5–5.0)
Alkaline Phosphatase: 76 U/L (ref 38–126)
Bilirubin, Direct: 0.2 mg/dL (ref 0.0–0.2)
Indirect Bilirubin: 0.5 mg/dL (ref 0.3–0.9)
Total Bilirubin: 0.7 mg/dL (ref 0.3–1.2)
Total Protein: 6.1 g/dL — ABNORMAL LOW (ref 6.5–8.1)

## 2021-10-12 LAB — CULTURE, BLOOD (ROUTINE X 2): Special Requests: ADEQUATE

## 2021-10-12 LAB — CBC
HCT: 26.1 % — ABNORMAL LOW (ref 36.0–46.0)
Hemoglobin: 8.7 g/dL — ABNORMAL LOW (ref 12.0–15.0)
MCH: 31.1 pg (ref 26.0–34.0)
MCHC: 33.3 g/dL (ref 30.0–36.0)
MCV: 93.2 fL (ref 80.0–100.0)
Platelets: 145 10*3/uL — ABNORMAL LOW (ref 150–400)
RBC: 2.8 MIL/uL — ABNORMAL LOW (ref 3.87–5.11)
RDW: 13.6 % (ref 11.5–15.5)
WBC: 6.7 10*3/uL (ref 4.0–10.5)
nRBC: 0 % (ref 0.0–0.2)

## 2021-10-12 LAB — MAGNESIUM: Magnesium: 2 mg/dL (ref 1.7–2.4)

## 2021-10-12 LAB — HEPARIN LEVEL (UNFRACTIONATED): Heparin Unfractionated: 0.42 [IU]/mL (ref 0.30–0.70)

## 2021-10-12 SURGERY — CORONARY ARTERY BYPASS GRAFTING (CABG)
Anesthesia: General | Site: Chest

## 2021-10-12 MED ORDER — POLYETHYLENE GLYCOL 3350 17 G PO PACK
17.0000 g | PACK | Freq: Two times a day (BID) | ORAL | Status: DC
  Administered 2021-10-12 – 2021-10-13 (×2): 17 g via ORAL
  Filled 2021-10-12 (×3): qty 1

## 2021-10-12 MED ORDER — IOHEXOL 350 MG/ML SOLN
100.0000 mL | Freq: Once | INTRAVENOUS | Status: AC | PRN
  Administered 2021-10-12: 09:00:00 100 mL via INTRAVENOUS

## 2021-10-12 MED ORDER — POTASSIUM CHLORIDE CRYS ER 20 MEQ PO TBCR
40.0000 meq | EXTENDED_RELEASE_TABLET | Freq: Once | ORAL | Status: AC
  Administered 2021-10-12: 12:00:00 40 meq via ORAL
  Filled 2021-10-12: qty 2

## 2021-10-12 MED ORDER — FUROSEMIDE 10 MG/ML IJ SOLN
40.0000 mg | Freq: Once | INTRAMUSCULAR | Status: AC
  Administered 2021-10-12: 11:00:00 40 mg via INTRAVENOUS
  Filled 2021-10-12: qty 4

## 2021-10-12 MED ORDER — METOPROLOL TARTRATE 5 MG/5ML IV SOLN
INTRAVENOUS | Status: AC
  Administered 2021-10-12: 09:00:00 2.5 mg
  Filled 2021-10-12: qty 5

## 2021-10-12 MED ORDER — INSULIN NPH (HUMAN) (ISOPHANE) 100 UNIT/ML ~~LOC~~ SUSP
20.0000 [IU] | Freq: Two times a day (BID) | SUBCUTANEOUS | Status: DC
  Administered 2021-10-12 – 2021-10-13 (×2): 20 [IU] via SUBCUTANEOUS
  Filled 2021-10-12: qty 10

## 2021-10-12 MED ORDER — BISACODYL 5 MG PO TBEC: 10.0000 mg | DELAYED_RELEASE_TABLET | Freq: Every day | ORAL | Status: DC | PRN

## 2021-10-12 NOTE — Progress Notes (Addendum)
Notified by nurse K 3.6, getting additional IV Lasix today, inquiring re: KCl repletion. Remainder of labs reviewed. Will give 73meq x1. Recheck labs in AM.

## 2021-10-12 NOTE — Plan of Care (Signed)
°  Problem: Education: Goal: Understanding of cardiac disease, CV risk reduction, and recovery process will improve Outcome: Progressing Goal: Understanding of medication regimen will improve Outcome: Progressing Goal: Individualized Educational Video(s) Outcome: Progressing   Problem: Activity: Goal: Ability to tolerate increased activity will improve Outcome: Progressing   Problem: Cardiac: Goal: Ability to achieve and maintain adequate cardiopulmonary perfusion will improve Outcome: Progressing Goal: Vascular access site(s) Level 0-1 will be maintained Outcome: Progressing   Problem: Education: Goal: Knowledge of General Education information will improve Description: Including pain rating scale, medication(s)/side effects and non-pharmacologic comfort measures Outcome: Progressing   Problem: Health Behavior/Discharge Planning: Goal: Ability to manage health-related needs will improve Outcome: Progressing   Problem: Clinical Measurements: Goal: Ability to maintain clinical measurements within normal limits will improve Outcome: Progressing Goal: Will remain free from infection Outcome: Progressing Goal: Diagnostic test results will improve Outcome: Progressing Goal: Respiratory complications will improve Outcome: Progressing Goal: Cardiovascular complication will be avoided Outcome: Progressing

## 2021-10-12 NOTE — Consult Note (Signed)
[facility] Infectious Disease    Date of Admission:  10/09/2021     Total days of antibiotics 1  Ceftriaxone               Reason for Consult: E coli Bacteremia     Referring Provider: Gwenlyn Hill  Primary Care Provider: No primary care provider on file.   Assessment: Molly Hill is a very pleasant 70 y.o. female admitted with a 2-4 week history of escalating shortness of breath with secondary chest pain. Anterior STEMI at presentation with plans for PCI and TAVR for severe aortic stenosis.  Shortly after her catheterization she developed high fevers with blood cultures growing E coli. In discussion with her she never felt acutely ill or bad during this time and had no prodromal infectious symptoms leading up to hospitalization. Outside of shortness of breath and hand swelling she has no localizing complaints at present. CT scans reviewed and no acute infectious process that would explain bacteremia. She does have 2 lesions on the left kidney that are described to be unusual appearing and concerning for neoplasm - she has no flank pain and there is no perinephritic stranding described. Seems less likely these findings reflect abscess without inflammatory findings or symptoms.   Exam is overall benign aside from systolic murmur and new oxygen requirement.   Explained that the source of her infection is not clear at this time. It may be possible she had transient bacteremia in the setting of recent vascular access/multiple PIVs. Fortunately with E coli it is low risk for native valve endocarditis.   Will add on LFTs. Continue ceftriaxone - may be able to narrow further pending susceptibilities   Plan: Continue ceftriaxone IV Follow susceptibilities further     Principal Problem:   E coli bacteremia Active Problems:   Carcinoma of overlapping sites of right breast in female, estrogen receptor negative ([place])   Acute ST elevation myocardial infarction (STEMI) due to  occlusion of distal portion of left anterior descending (LAD) coronary artery (HCC)   STEMI (ST elevation myocardial infarction) (HCC)   Sepsis due to Escherichia coli (E. coli) (HCC)    amLODipine  2.5 mg Oral Daily   aspirin  81 mg Oral Daily   atorvastatin  80 mg Oral Daily   B-complex with vitamin C  1 tablet Oral Daily   calcium-vitamin D  1 tablet Oral Q breakfast   Chlorhexidine Gluconate Cloth  6 each Topical Daily   cholecalciferol  1,000 Units Oral Daily   docusate sodium  200 mg Oral Daily   heparin-papaverine-plasmalyte irrigation   Irrigation To OR   insulin aspart  0-5 Units Subcutaneous QHS   insulin aspart  0-9 Units Subcutaneous TID WC   insulin aspart  2 Units Subcutaneous TID WC   insulin NPH Human  18 Units Subcutaneous BID AC & HS   insulin   Intravenous To OR   mouth rinse  15 mL Mouth Rinse BID   metoprolol succinate  25 mg Oral Daily   mometasone-formoterol  2 puff Inhalation BID   phenylephrine  30-200 mcg/min Intravenous To OR   polyethylene glycol  17 g Oral BID   sodium chloride flush  3 mL Intravenous Q12H   vitamin B-12  1,000 mcg Oral Daily    HPI: Molly Hill is a 70 y.o. female admitted from home with several week history of worsening SOB and anterior chest pain.  Her daughter joins [place]  at the bedside.   Presented with subacute anterior STEMI - went to cath lab and via right radial site had left heart catheterization notable for high grade ostial RCA stenosis, 90% distal RPAV stenosis and proximal LAD stenosis.  Bicuspid AoV --> now with severe insufficiency. CT surgery and structural heart team following and planning possible TAVR and PCI for treatment.  T1DM on insulin (hgb a1c 7.8%).  H/o breast cancer s/p chemotherapy, mastectomy and radiation therapy.   She states that prior to admission she has not noticed anything out of the ordinary outside her chest pain and SOB. She has not noticed any infectious symptoms and denies any fevers, chills,  vomiting, diarrhea, abdominal pain, dysuria or flank pain. No wounds anywhere. She has no vascular hardware but notes she has a lot of metal in her ankles from previous fractures - no current problem associated with these.   The evening of 12/26 after her catheterization she developed fever to 102 F. No leukocytosis noted. Blood cultures were drawn yielding E coli growth --> started on cefepime and switched to ceftriaxone. She states she never really felt very bad with the fever and has not had another fever since very early this morning.  She continues to have no localizing complaints outside the SOB.   Imaging Findings -  Enlarged heart, no pleural fluid, aortic atherosclerosis with severe thickening and calcification of the AoV and calcifications of the mitral annulus.  RUL with chronic thickening and distortion thought to be due to previous port and fibrosis related radiation.  2 unusual appearing heterogeneously enhancing lesions in the left kidney concerning for neoplasm.  Colonic diverticulosis without any evidence of acute diverticulitis.   All PIV sites are normal. One family member reported with an episode of emesis following eating [place] on christmas that everyone shared. [name] has not had any GI symptoms.    Review of Systems: Review of Systems  Constitutional:  Positive for fever. Negative for chills, diaphoresis and weight loss.  Respiratory:  Positive for shortness of breath. Negative for cough and sputum production.   Cardiovascular:  Negative for chest pain and leg swelling.  Gastrointestinal:  Negative for abdominal pain, diarrhea, nausea and vomiting.  Genitourinary:  Negative for dysuria, flank pain, frequency and urgency.  Musculoskeletal:  Negative for back pain and joint pain.  Skin:  Negative for [place].  Neurological:  Negative for dizziness and headaches.   Past Medical History:  Diagnosis Date   Alopecia    Anemia    Bicuspid aortic valve    mild to  moderate AS by echo 2022   Carotid stenosis    1-39% bilateral carotid stenosis by dopplers 07/2021   Convalescence after chemotherapy    Coronary arteriosclerosis in native artery 2013   s/p PCI of LAD   DM (diabetes mellitus), type 1 (HCC)    Estrogen receptor negative    History of breast cancer    Hyperglycemia    Hyperlipidemia    Hypertension    Mild dehydration    Mitral regurgitation    mild to moderate by echo 2020   Murmur, heart    Swelling of joint of both wrists    Tendinitis of left wrist     Social History   Tobacco Use   Smoking status: Former    Years: 3.00    Types: Cigarettes    Quit date: 08/15/1979    Years since quitting: 42.1   Smokeless tobacco: Never   Tobacco comments:  1 pack would last a week.  Substance Use Topics   Alcohol use: Never   Drug use: Never    Family History  Problem Relation Age of Onset   Heart attack Mother    CAD Father        CABG   Diabetes Father    Stroke Father    Colon cancer Father        67s   Diabetes Brother    Diabetes Paternal Grandfather    Heart attack Paternal Grandfather    Breast cancer Maternal Aunt        53s   Allergies  Allergen Reactions   Codeine     Hyper with vomiting   Onion Diarrhea, Nausea And Vomiting and Other (See Comments)    syncope   Reglan [Metoclopramide]     "pulls muscle to side" "feel paralyzed"    OBJECTIVE: Blood pressure (!) 129/54, pulse 77, temperature 98.2 F (36.8 C), resp. rate 16, height 5' 1.25" (1.556 m), weight 68.4 kg, SpO2 100 %.  Physical Exam Vitals reviewed.  Constitutional:      Appearance: She is well-developed.     Comments: Resting comfortably in bed in no distress  HENT:     Mouth/Throat:     Mouth: No oral lesions.     Dentition: Normal dentition. No dental abscesses.     Pharynx: No oropharyngeal exudate.  Cardiovascular:     Rate and Rhythm: Normal rate and regular rhythm.     Heart sounds: Murmur heard.  Systolic murmur is  present.  Pulmonary:     Effort: Pulmonary effort is normal.     Breath sounds: Normal breath sounds.  Abdominal:     General: There is no distension.     Palpations: Abdomen is soft.     Tenderness: There is no abdominal tenderness.  Lymphadenopathy:     Cervical: No cervical adenopathy.  Skin:    General: Skin is warm and dry.     Findings: No Egnor.  Neurological:     Mental Status: She is alert and oriented to person, place, and time.  Psychiatric:        Judgment: Judgment normal.     Comments: In good spirits today and engaged in care discussion    Lab Results Lab Results  Component Value Date   WBC 6.7 10/12/2021   HGB 8.7 (L) 10/12/2021   HCT 26.1 (L) 10/12/2021   MCV 93.2 10/12/2021   PLT 145 (L) 10/12/2021    Lab Results  Component Value Date   CREATININE 1.18 (H) 10/12/2021   BUN 28 (H) 10/12/2021   NA 132 (L) 10/12/2021   K 3.6 10/12/2021   CL 96 (L) 10/12/2021   CO2 25 10/12/2021    Lab Results  Component Value Date   ALT 31 10/12/2021   AST 38 10/12/2021   ALKPHOS 76 10/12/2021   BILITOT 0.7 10/12/2021     Microbiology: Recent Results (from the past 240 hour(s))  Resp Panel by RT-PCR (Flu A&B, Covid) Nasopharyngeal Swab     Status: None   Collection Time: 10/09/21 10:54 PM   Specimen: Nasopharyngeal Swab; Nasopharyngeal(NP) swabs in vial transport medium  Result Value Ref Range Status   SARS Coronavirus 2 by RT PCR NEGATIVE NEGATIVE Final    Comment: (NOTE) SARS-CoV-2 target nucleic acids are NOT DETECTED.  The SARS-CoV-2 RNA is generally detectable in upper respiratory specimens during the acute phase of infection. The lowest concentration of SARS-CoV-2 viral copies this assay  can detect is 138 copies/mL. A negative result does not preclude SARS-Cov-2 infection and should not be used as the sole basis for treatment or other patient management decisions. A negative result may occur with  improper specimen collection/handling, submission of  specimen other than nasopharyngeal swab, presence of viral mutation(s) within the areas targeted by this assay, and inadequate number of viral copies(<138 copies/mL). A negative result must be combined with clinical observations, patient history, and epidemiological information. The expected result is Negative.  Fact Sheet for Patients:  EntrepreneurPulse.com.au  Fact Sheet for Healthcare Providers:  IncredibleEmployment.be  This test is no t yet approved or cleared by the [place] FDA and  has been authorized for detection and/or diagnosis of SARS-CoV-2 by FDA under an Emergency Use Authorization (EUA). This EUA will remain  in effect (meaning this test can be used) for the duration of the COVID-19 declaration under Section 564(b)(1) of the Act, 21 U.S.C.section 360bbb-3(b)(1), unless the authorization is terminated  or revoked sooner.       Influenza A by PCR NEGATIVE NEGATIVE Final   Influenza B by PCR NEGATIVE NEGATIVE Final    Comment: (NOTE) The Xpert Xpress SARS-CoV-2/FLU/RSV plus assay is intended as an aid in the diagnosis of influenza from Nasopharyngeal swab specimens and should not be used as a sole basis for treatment. Nasal washings and aspirates are unacceptable for Xpert Xpress SARS-CoV-2/FLU/RSV testing.  Fact Sheet for Patients: EntrepreneurPulse.com.au  Fact Sheet for Healthcare Providers: IncredibleEmployment.be  This test is not yet approved or cleared by the [place] FDA and has been authorized for detection and/or diagnosis of SARS-CoV-2 by FDA under an Emergency Use Authorization (EUA). This EUA will remain in effect (meaning this test can be used) for the duration of the COVID-19 declaration under Section 564(b)(1) of the Act, 21 U.S.C. section 360bbb-3(b)(1), unless the authorization is terminated or revoked.  Performed at [facility], [place] [address],  [place], [place] [address]   MRSA Next Gen by PCR, Nasal     Status: None   Collection Time: 10/10/21  2:30 AM   Specimen: Nasal Mucosa; Nasal Swab  Result Value Ref Range Status   MRSA by PCR Next Gen NOT DETECTED NOT DETECTED Final    Comment: (NOTE) The GeneXpert MRSA Assay (FDA approved for NASAL specimens only), is one component of a comprehensive MRSA colonization surveillance program. It is not intended to diagnose MRSA infection nor to guide or monitor treatment for MRSA infections. Test performance is not FDA approved in patients less than 34 years old. Performed at [facility], [place] [address] [place], [place] [address]   Culture, blood (Routine X 2) w Reflex to ID Panel     Status: Abnormal (Preliminary result)   Collection Time: 10/11/21  1:12 AM   Specimen: BLOOD  Result Value Ref Range Status   Specimen Description BLOOD LEFT ANTECUBITAL  Final   Special Requests   Final    BOTTLES DRAWN AEROBIC AND ANAEROBIC Blood Culture adequate volume   Culture  Setup Time   Final    GRAM NEGATIVE RODS AEROBIC BOTTLE ONLY Organism ID to follow CRITICAL RESULT CALLED TO, READ BACK BY AND VERIFIED WITH: Dion Body PHARMD 1834 10/11/21 A BROWNING    Culture (A)  Final    ESCHERICHIA COLI SUSCEPTIBILITIES TO FOLLOW Performed at [facility], [place] [address], [place], [place] [address]    Report Status PENDING  Incomplete  Blood Culture ID Panel (Reflexed)  Status: Abnormal   Collection Time: 10/11/21  1:12 AM  Result Value Ref Range Status   Enterococcus faecalis NOT DETECTED NOT DETECTED Final   Enterococcus Faecium NOT DETECTED NOT DETECTED Final   Listeria monocytogenes NOT DETECTED NOT DETECTED Final   Staphylococcus species NOT DETECTED NOT DETECTED Final   Staphylococcus aureus (BCID) NOT DETECTED NOT DETECTED Final   Staphylococcus epidermidis NOT DETECTED NOT DETECTED Final   Staphylococcus lugdunensis NOT DETECTED NOT DETECTED Final   Streptococcus species  NOT DETECTED NOT DETECTED Final   Streptococcus agalactiae NOT DETECTED NOT DETECTED Final   Streptococcus pneumoniae NOT DETECTED NOT DETECTED Final   Streptococcus pyogenes NOT DETECTED NOT DETECTED Final   A.calcoaceticus-baumannii NOT DETECTED NOT DETECTED Final   Bacteroides fragilis NOT DETECTED NOT DETECTED Final   Enterobacterales DETECTED (A) NOT DETECTED Final    Comment: Enterobacterales represent a large order of gram negative bacteria, not a single organism. CRITICAL RESULT CALLED TO, READ BACK BY AND VERIFIED WITH: Dion Body PHARMD 1834 10/11/21 A BROWNING    Enterobacter cloacae complex NOT DETECTED NOT DETECTED Final   Escherichia coli DETECTED (A) NOT DETECTED Final    Comment: CRITICAL RESULT CALLED TO, READ BACK BY AND VERIFIED WITH: Dion Body PHARMD 1834 10/11/21 A BROWNING    Klebsiella aerogenes NOT DETECTED NOT DETECTED Final   Klebsiella oxytoca NOT DETECTED NOT DETECTED Final   Klebsiella pneumoniae NOT DETECTED NOT DETECTED Final   Proteus species NOT DETECTED NOT DETECTED Final   Salmonella species NOT DETECTED NOT DETECTED Final   Serratia marcescens NOT DETECTED NOT DETECTED Final   Haemophilus influenzae NOT DETECTED NOT DETECTED Final   Neisseria meningitidis NOT DETECTED NOT DETECTED Final   Pseudomonas aeruginosa NOT DETECTED NOT DETECTED Final   Stenotrophomonas maltophilia NOT DETECTED NOT DETECTED Final   Candida albicans NOT DETECTED NOT DETECTED Final   Candida auris NOT DETECTED NOT DETECTED Final   Candida glabrata NOT DETECTED NOT DETECTED Final   Candida krusei NOT DETECTED NOT DETECTED Final   Candida parapsilosis NOT DETECTED NOT DETECTED Final   Candida tropicalis NOT DETECTED NOT DETECTED Final   Cryptococcus neoformans/gattii NOT DETECTED NOT DETECTED Final   CTX-M ESBL NOT DETECTED NOT DETECTED Final   Carbapenem resistance IMP NOT DETECTED NOT DETECTED Final   Carbapenem resistance KPC NOT DETECTED NOT DETECTED Final   Carbapenem  resistance NDM NOT DETECTED NOT DETECTED Final   Carbapenem resist OXA 48 LIKE NOT DETECTED NOT DETECTED Final   Carbapenem resistance VIM NOT DETECTED NOT DETECTED Final    Comment: Performed at [facility], [address], [place], [place] [address]  Culture, blood (Routine X 2) w Reflex to ID Panel     Status: Abnormal   Collection Time: 10/11/21  1:27 AM   Specimen: BLOOD RIGHT HAND  Result Value Ref Range Status   Specimen Description BLOOD RIGHT HAND  Final   Special Requests AEROBIC BOTTLE ONLY Blood Culture adequate volume  Final   Culture  Setup Time   Final    GRAM POSITIVE COCCI IN CLUSTERS AEROBIC BOTTLE ONLY CRITICAL RESULT CALLED TO, READ BACK BY AND VERIFIED WITH: PHARMD JESSICA MILLEN 10/11/2021 @2239  BY JW    Culture (A)  Final    STAPHYLOCOCCUS HOMINIS THE SIGNIFICANCE OF ISOLATING THIS ORGANISM FROM A SINGLE SET OF BLOOD CULTURES WHEN MULTIPLE SETS ARE DRAWN IS UNCERTAIN. PLEASE NOTIFY THE MICROBIOLOGY DEPARTMENT WITHIN ONE WEEK IF SPECIATION AND SENSITIVITIES ARE REQUIRED. Performed at [facility], [place]  [address] [place], [place] [address]    Report Status 10/12/2021 FINAL  Final  Blood Culture ID Panel (Reflexed)     Status: Abnormal   Collection Time: 10/11/21  1:27 AM  Result Value Ref Range Status   Enterococcus faecalis NOT DETECTED NOT DETECTED Final   Enterococcus Faecium NOT DETECTED NOT DETECTED Final   Listeria monocytogenes NOT DETECTED NOT DETECTED Final   Staphylococcus species DETECTED (A) NOT DETECTED Final    Comment: CRITICAL RESULT CALLED TO, READ BACK BY AND VERIFIED WITH: PHARMD JESSICA MILLEN 10/11/2021 @2239  BY JW    Staphylococcus aureus (BCID) NOT DETECTED NOT DETECTED Final   Staphylococcus epidermidis NOT DETECTED NOT DETECTED Final   Staphylococcus lugdunensis NOT DETECTED NOT DETECTED Final   Streptococcus species NOT DETECTED NOT DETECTED Final   Streptococcus agalactiae NOT DETECTED NOT DETECTED Final   Streptococcus  pneumoniae NOT DETECTED NOT DETECTED Final   Streptococcus pyogenes NOT DETECTED NOT DETECTED Final   A.calcoaceticus-baumannii NOT DETECTED NOT DETECTED Final   Bacteroides fragilis NOT DETECTED NOT DETECTED Final   Enterobacterales NOT DETECTED NOT DETECTED Final   Enterobacter cloacae complex NOT DETECTED NOT DETECTED Final   Escherichia coli NOT DETECTED NOT DETECTED Final   Klebsiella aerogenes NOT DETECTED NOT DETECTED Final   Klebsiella oxytoca NOT DETECTED NOT DETECTED Final   Klebsiella pneumoniae NOT DETECTED NOT DETECTED Final   Proteus species NOT DETECTED NOT DETECTED Final   Salmonella species NOT DETECTED NOT DETECTED Final   Serratia marcescens NOT DETECTED NOT DETECTED Final   Haemophilus influenzae NOT DETECTED NOT DETECTED Final   Neisseria meningitidis NOT DETECTED NOT DETECTED Final   Pseudomonas aeruginosa NOT DETECTED NOT DETECTED Final   Stenotrophomonas maltophilia NOT DETECTED NOT DETECTED Final   Candida albicans NOT DETECTED NOT DETECTED Final   Candida auris NOT DETECTED NOT DETECTED Final   Candida glabrata NOT DETECTED NOT DETECTED Final   Candida krusei NOT DETECTED NOT DETECTED Final   Candida parapsilosis NOT DETECTED NOT DETECTED Final   Candida tropicalis NOT DETECTED NOT DETECTED Final   Cryptococcus neoformans/gattii NOT DETECTED NOT DETECTED Final    Comment: Performed at [facility], [address], [place], [place] [address]    Janene Madeira, MSN, NP-C [facility] [place] Pager: [phone]  10/12/2021 2:03 PM

## 2021-10-12 NOTE — Progress Notes (Signed)
Inpatient Diabetes Program Recommendations  AACE/ADA: New Consensus Statement on Inpatient Glycemic Control (2015)  Target Ranges:  Prepandial:   less than 140 mg/dL      Peak postprandial:   less than 180 mg/dL (1-2 hours)      Critically ill patients:  140 - 180 mg/dL   Lab Results  Component Value Date   GLUCAP 232 (H) 10/12/2021   HGBA1C 7.8 (H) 10/09/2021    Review of Glycemic Control  Latest Reference Range & Units 10/11/21 07:50 10/11/21 11:56 10/11/21 16:55 10/11/21 20:43 10/12/21 00:52 10/12/21 06:49  Glucose-Capillary 70 - 99 mg/dL 229 (H) 232 (H) 238 (H) 299 (H) 270 (H) 232 (H)  (H): Data is abnormally high  Diabetes history: DM1 Outpatient Diabetes medications: Novolin NPH 18 units bid, Novolin R 3-5 units tid meal coverage Current orders for Inpatient glycemic control: NPH 18 units bid ac meals, Novolog correction 0-9 units tid, 0-5 units hs, Novolog 2 units TID with meals  Inpatient Diabetes Program Recommendations:    NPH 20 units BID  Will continue to follow while inpatient.  Thank you, Reche Dixon, RN, BSN Diabetes Coordinator Inpatient Diabetes Program [phone] (team pager from 8a-5p)

## 2021-10-12 NOTE — Progress Notes (Signed)
ANTICOAGULATION CONSULT NOTE   Pharmacy Consult for IV Heparin Indication: chest pain/ACS  Allergies  Allergen Reactions   Codeine     Hyper with vomiting   Onion Diarrhea, Nausea And Vomiting and Other (See Comments)    syncope   Reglan [Metoclopramide]     "pulls muscle to side" "feel paralyzed"    Patient Measurements: Height: 5' 1.25" (155.6 cm) Weight: 68.4 kg (150 lb 12.7 oz) IBW/kg (Calculated) : 48.38 Heparin Dosing Weight: 63 kg  Vital Signs: Temp: 98.2 F (36.8 C) (12/28 1100) Temp Source: Oral (12/28 0600) BP: 129/54 (12/28 1100) Pulse Rate: 77 (12/28 1100)  Labs: Recent Labs    10/09/21 2231 10/10/21 0605 10/10/21 1015 10/10/21 1415 10/11/21 0002 10/11/21 0022 10/11/21 1500 10/12/21 0124 10/12/21 0928  HGB  --  9.0*  --   --  8.5* 8.9*  --  8.7*  --   HCT  --  27.7*  --   --  25.0* 26.5*  --  26.1*  --   PLT  --  164  --   --   --  159  --  145*  --   APTT >200*  --   --   --   --   --   --   --   --   LABPROT 16.0* 14.1  --   --   --   --   --   --   --   INR 1.3* 1.1  --   --   --   --   --   --   --   HEPARINUNFRC  --   --   --    < >  --  QUESTIONABLE RESULTS, RECOMMEND RECOLLECT TO VERIFY 0.44 0.42  --   CREATININE  --  1.14*  --   --   --  1.20*  --   --  1.18*  TROPONINIHS  --  154* 199*  --   --  199*  --   --   --    < > = values in this interval not displayed.     Estimated Creatinine Clearance: 38.9 mL/min (A) (by C-G formula based on SCr of 1.18 mg/dL (H)).   Medical History: Past Medical History:  Diagnosis Date   Alopecia    Anemia    Bicuspid aortic valve    mild to moderate AS by echo 2022   Carotid stenosis    1-39% bilateral carotid stenosis by dopplers 07/2021   Convalescence after chemotherapy    Coronary arteriosclerosis in native artery 2013   s/p PCI of LAD   DM (diabetes mellitus), type 1 (HCC)    Estrogen receptor negative    History of breast cancer    Hyperglycemia    Hyperlipidemia    Hypertension     Mild dehydration    Mitral regurgitation    mild to moderate by echo 2020   Murmur, heart    Swelling of joint of both wrists    Tendinitis of left wrist     Medications:    Assessment: 70 y.o. F presents with CP/ STEMI. S/p cath lab 12/26 - no interventions done. Plan to start heparin 4 hours post TR band removal (removed at 0110). No AC PTA. Hgb 10.9 on admission. Noted pt with coughing and pink tinged sputum.  Heparin level remains therapeutic on 950 units/hr.  No overt bleeding or complications noted. Hgb down slightly from admission 10.9 > 8.7.  Pltc stable.  Goal  of Therapy:  Heparin level 0.3-0.7 units/ml Monitor platelets by anticoagulation protocol: Yes   Plan:  Continue IV heparin to 950 units/hr. Daily heparin level and CBC.  [place] Crane, Roylene Reason, BCCP Clinical Pharmacist  10/12/2021 2:07 PM   [facility] pharmacy phone numbers are listed on [email or web address]

## 2021-10-13 ENCOUNTER — Inpatient Hospital Stay (HOSPITAL_COMMUNITY): Payer: Medicare HMO

## 2021-10-13 DIAGNOSIS — I200 Unstable angina: Secondary | ICD-10-CM | POA: Diagnosis not present

## 2021-10-13 DIAGNOSIS — R7881 Bacteremia: Secondary | ICD-10-CM | POA: Diagnosis not present

## 2021-10-13 DIAGNOSIS — I2102 ST elevation (STEMI) myocardial infarction involving left anterior descending coronary artery: Secondary | ICD-10-CM | POA: Diagnosis not present

## 2021-10-13 DIAGNOSIS — B9620 Unspecified Escherichia coli [E. coli] as the cause of diseases classified elsewhere: Secondary | ICD-10-CM | POA: Diagnosis not present

## 2021-10-13 DIAGNOSIS — I350 Nonrheumatic aortic (valve) stenosis: Secondary | ICD-10-CM | POA: Diagnosis not present

## 2021-10-13 LAB — CULTURE, BLOOD (ROUTINE X 2): Special Requests: ADEQUATE

## 2021-10-13 LAB — CBC
HCT: 25.8 % — ABNORMAL LOW (ref 36.0–46.0)
HCT: 31.4 % — ABNORMAL LOW (ref 36.0–46.0)
Hemoglobin: 8.7 g/dL — ABNORMAL LOW (ref 12.0–15.0)
Hemoglobin: 10.5 g/dL — ABNORMAL LOW (ref 12.0–15.0)
MCH: 31.4 pg (ref 26.0–34.0)
MCH: 31.3 pg (ref 26.0–34.0)
MCHC: 33.7 g/dL (ref 30.0–36.0)
MCHC: 33.4 g/dL (ref 30.0–36.0)
MCV: 93.1 fL (ref 80.0–100.0)
MCV: 93.5 fL (ref 80.0–100.0)
Platelets: 171 10*3/uL (ref 150–400)
Platelets: 200 10*3/uL (ref 150–400)
RBC: 2.77 MIL/uL — ABNORMAL LOW (ref 3.87–5.11)
RBC: 3.36 MIL/uL — ABNORMAL LOW (ref 3.87–5.11)
RDW: 13.6 % (ref 11.5–15.5)
RDW: 13.6 % (ref 11.5–15.5)
WBC: 7.6 10*3/uL (ref 4.0–10.5)
WBC: 8.3 10*3/uL (ref 4.0–10.5)
nRBC: 0 % (ref 0.0–0.2)
nRBC: 0 % (ref 0.0–0.2)

## 2021-10-13 LAB — GLUCOSE, CAPILLARY
Glucose-Capillary: 101 mg/dL — ABNORMAL HIGH (ref 70–99)
Glucose-Capillary: 86 mg/dL (ref 70–99)
Glucose-Capillary: 67 mg/dL — ABNORMAL LOW (ref 70–99)
Glucose-Capillary: 86 mg/dL (ref 70–99)
Glucose-Capillary: 234 mg/dL — ABNORMAL HIGH (ref 70–99)

## 2021-10-13 LAB — BASIC METABOLIC PANEL
Anion gap: 10 (ref 5–15)
BUN: 27 mg/dL — ABNORMAL HIGH (ref 8–23)
CO2: 25 mmol/L (ref 22–32)
Calcium: 8.3 mg/dL — ABNORMAL LOW (ref 8.9–10.3)
Chloride: 96 mmol/L — ABNORMAL LOW (ref 98–111)
Creatinine, Ser: 1.1 mg/dL — ABNORMAL HIGH (ref 0.44–1.00)
GFR, Estimated: 54 mL/min — ABNORMAL LOW (ref >60)
Glucose, Bld: 160 mg/dL — ABNORMAL HIGH (ref 70–99)
Potassium: 3.9 mmol/L (ref 3.5–5.1)
Sodium: 131 mmol/L — ABNORMAL LOW (ref 135–145)

## 2021-10-13 LAB — HEPARIN LEVEL (UNFRACTIONATED): Heparin Unfractionated: 0.5 IU/mL (ref 0.30–0.70)

## 2021-10-13 LAB — MAGNESIUM: Magnesium: 2 mg/dL (ref 1.7–2.4)

## 2021-10-13 LAB — CREATININE, SERUM
Creatinine, Ser: 1.17 mg/dL — ABNORMAL HIGH (ref 0.44–1.00)
GFR, Estimated: 50 mL/min — ABNORMAL LOW (ref >60)

## 2021-10-13 MED ORDER — CLOPIDOGREL BISULFATE 300 MG PO TABS
300.0000 mg | ORAL_TABLET | Freq: Once | ORAL | Status: AC
  Administered 2021-10-13: 18:00:00 300 mg via ORAL
  Filled 2021-10-13: qty 1

## 2021-10-13 MED ORDER — CLOPIDOGREL BISULFATE 75 MG PO TABS
75.0000 mg | ORAL_TABLET | Freq: Every day | ORAL | Status: DC
  Administered 2021-10-14: 10:00:00 75 mg via ORAL
  Filled 2021-10-13: qty 1

## 2021-10-13 MED ORDER — FUROSEMIDE 40 MG PO TABS
40.0000 mg | ORAL_TABLET | Freq: Every day | ORAL | Status: DC
  Administered 2021-10-14: 10:00:00 40 mg via ORAL
  Filled 2021-10-13: qty 1

## 2021-10-13 MED ORDER — CEFAZOLIN SODIUM-DEXTROSE 2-4 GM/100ML-% IV SOLN
2.0000 g | Freq: Three times a day (TID) | INTRAVENOUS | Status: DC
Start: 2021-10-13 — End: 2021-10-14
  Administered 2021-10-13 – 2021-10-14 (×3): 2 g via INTRAVENOUS
  Filled 2021-10-13 (×3): qty 100

## 2021-10-13 MED ORDER — INSULIN NPH (HUMAN) (ISOPHANE) 100 UNIT/ML ~~LOC~~ SUSP
10.0000 [IU] | Freq: Two times a day (BID) | SUBCUTANEOUS | Status: DC
  Administered 2021-10-13 – 2021-10-14 (×2): 10 [IU] via SUBCUTANEOUS
  Filled 2021-10-13: qty 10

## 2021-10-13 MED ORDER — ENOXAPARIN SODIUM 40 MG/0.4ML IJ SOSY
40.0000 mg | PREFILLED_SYRINGE | INTRAMUSCULAR | Status: DC
  Administered 2021-10-14: 10:00:00 40 mg via SUBCUTANEOUS
  Filled 2021-10-13: qty 0.4

## 2021-10-13 MED ORDER — FUROSEMIDE 10 MG/ML IJ SOLN
40.0000 mg | Freq: Once | INTRAMUSCULAR | Status: AC
  Administered 2021-10-13: 11:00:00 40 mg via INTRAVENOUS
  Filled 2021-10-13: qty 4

## 2021-10-13 MED ORDER — GADOBUTROL 1 MMOL/ML IV SOLN
6.0000 mL | Freq: Once | INTRAVENOUS | Status: AC | PRN
  Administered 2021-10-13: 16:00:00 6 mL via INTRAVENOUS

## 2021-10-13 MED ORDER — POTASSIUM CHLORIDE CRYS ER 20 MEQ PO TBCR
20.0000 meq | EXTENDED_RELEASE_TABLET | Freq: Once | ORAL | Status: AC
  Administered 2021-10-13: 11:00:00 20 meq via ORAL
  Filled 2021-10-13: qty 1

## 2021-10-13 NOTE — Progress Notes (Signed)
[facility]  Date of Admission:  10/09/2021      Total days of antibiotics 2  Cefazolin   Ceftriaxone x 2d          ASSESSMENT: Molly Hill is a 70 y.o. female admitted with worsening SOB and chest pain in the setting of anterior STEMI and worsening AS. Planning PCI and TAVR now given high risk for conventional open chest surgery with h/o breast cancer radiation in the past. Noted to have fever shortly after her catheterization with blood cultures growing E coli. Unclear as to her source at this time. No intraabdominal / liver abscess. LFTs normal. No UTI symptoms. No GI symptoms. TTEs without concern over vegetations (and this organism would not be high risk for SBE). Two lesions noted to left kidney that are rim enhancing but radiologist more concerned with possible neoplasm - no infectious / inflammatory symptoms here.  ?possible translocation of bacteria with transient bacteremia. Will narrow to cefazolin. Noted susceptibility with resistance to TMP-SX and FQ.  Will follow up MRI results and help to outline treatment plan.    PLAN: Narrow to Cefazolin  Follow MRI report    Principal Problem:   E coli bacteremia Active Problems:   Carcinoma of overlapping sites of right breast in female, estrogen receptor negative ([place])   Acute ST elevation myocardial infarction (STEMI) due to occlusion of distal portion of left anterior descending (LAD) coronary artery (HCC)   STEMI (ST elevation myocardial infarction) (HCC)   Sepsis due to Escherichia coli (E. coli) (HCC)    amLODipine  2.5 mg Oral Daily   aspirin  81 mg Oral Daily   atorvastatin  80 mg Oral Daily   B-complex with vitamin C  1 tablet Oral Daily   calcium-vitamin D  1 tablet Oral Q breakfast   Chlorhexidine Gluconate Cloth  6 each Topical Daily   cholecalciferol  1,000 Units Oral Daily   docusate sodium  200 mg Oral Daily   insulin aspart  0-5 Units Subcutaneous QHS   insulin  aspart  0-9 Units Subcutaneous TID WC   insulin aspart  2 Units Subcutaneous TID WC   insulin NPH Human  20 Units Subcutaneous BID AC & HS   mouth rinse  15 mL Mouth Rinse BID   metoprolol succinate  25 mg Oral Daily   mometasone-formoterol  2 puff Inhalation BID   polyethylene glycol  17 g Oral BID   sodium chloride flush  3 mL Intravenous Q12H   vitamin B-12  1,000 mcg Oral Daily    SUBJECTIVE: No changes from when we met yesterday. Feels overall well outside of SOB. Not sure she can lay flat for MRI but hopeful to further characterize the lesions on the left kidney.  Tolerating abx well without any side effects to her awareness.    Review of Systems: Review of Systems  Constitutional:  Negative for chills and fever.  HENT:  Negative for tinnitus.   Eyes:  Negative for blurred vision and photophobia.  Respiratory:  Positive for shortness of breath. Negative for cough and sputum production.   Cardiovascular:  Negative for chest pain.  Gastrointestinal:  Negative for diarrhea, nausea and vomiting.  Genitourinary:  Negative for dysuria.  Skin:  Negative for Dirosa.  Neurological:  Negative for headaches.   Allergies  Allergen Reactions   Codeine     Hyper with vomiting   Onion Diarrhea, Nausea And Vomiting and Other (  See Comments)    syncope   Reglan [Metoclopramide]     "pulls muscle to side" "feel paralyzed"    OBJECTIVE: Vitals:   10/13/21 0911 10/13/21 1000 10/13/21 1100 10/13/21 1137  BP:  (!) 122/53 104/63   Pulse:  81 70   Resp:  18 20   Temp:    98.6 F (37 C)  TempSrc:    Oral  SpO2: 100% 99% 93%   Weight:      Height:       Body mass index is 27.68 kg/m.  Physical Exam Vitals reviewed.  Constitutional:      Appearance: She is well-developed.     Comments: Sitting upright in bed. Resting comfortably.   HENT:     Mouth/Throat:     Mouth: No oral lesions.     Dentition: Normal dentition. No dental abscesses.     Pharynx: No oropharyngeal exudate.   Cardiovascular:     Rate and Rhythm: Normal rate and regular rhythm.     Heart sounds: Murmur heard.  Pulmonary:     Effort: Pulmonary effort is normal. No tachypnea or accessory muscle usage.     Breath sounds: Normal breath sounds.  Abdominal:     General: There is no distension.     Palpations: Abdomen is soft.     Tenderness: There is no abdominal tenderness.  Lymphadenopathy:     Cervical: No cervical adenopathy.  Skin:    General: Skin is warm and dry.     Findings: No Turrubiates.  Neurological:     Mental Status: She is alert and oriented to person, place, and time.  Psychiatric:        Judgment: Judgment normal.    Lab Results Lab Results  Component Value Date   WBC 7.6 10/13/2021   HGB 8.7 (L) 10/13/2021   HCT 25.8 (L) 10/13/2021   MCV 93.1 10/13/2021   PLT 171 10/13/2021    Lab Results  Component Value Date   CREATININE 1.10 (H) 10/13/2021   BUN 27 (H) 10/13/2021   NA 131 (L) 10/13/2021   K 3.9 10/13/2021   CL 96 (L) 10/13/2021   CO2 25 10/13/2021    Lab Results  Component Value Date   ALT 31 10/12/2021   AST 38 10/12/2021   ALKPHOS 76 10/12/2021   BILITOT 0.7 10/12/2021     Microbiology: Recent Results (from the past 240 hour(s))  Resp Panel by RT-PCR (Flu A&B, Covid) Nasopharyngeal Swab     Status: None   Collection Time: 10/09/21 10:54 PM   Specimen: Nasopharyngeal Swab; Nasopharyngeal(NP) swabs in vial transport medium  Result Value Ref Range Status   SARS Coronavirus 2 by RT PCR NEGATIVE NEGATIVE Final    Comment: (NOTE) SARS-CoV-2 target nucleic acids are NOT DETECTED.  The SARS-CoV-2 RNA is generally detectable in upper respiratory specimens during the acute phase of infection. The lowest concentration of SARS-CoV-2 viral copies this assay can detect is 138 copies/mL. A negative result does not preclude SARS-Cov-2 infection and should not be used as the sole basis for treatment or other patient management decisions. A negative result may  occur with  improper specimen collection/handling, submission of specimen other than nasopharyngeal swab, presence of viral mutation(s) within the areas targeted by this assay, and inadequate number of viral copies(<138 copies/mL). A negative result must be combined with clinical observations, patient history, and epidemiological information. The expected result is Negative.  Fact Sheet for Patients:  EntrepreneurPulse.com.au  Fact Sheet for Healthcare Providers:  IncredibleEmployment.be  This test is no t yet approved or cleared by the [place] and  has been authorized for detection and/or diagnosis of SARS-CoV-2 by FDA under an Emergency Use Authorization (EUA). This EUA will remain  in effect (meaning this test can be used) for the duration of the COVID-19 declaration under Section 564(b)(1) of the Act, 21 U.S.C.section 360bbb-3(b)(1), unless the authorization is terminated  or revoked sooner.       Influenza A by PCR NEGATIVE NEGATIVE Final   Influenza B by PCR NEGATIVE NEGATIVE Final    Comment: (NOTE) The Xpert Xpress SARS-CoV-2/FLU/RSV plus assay is intended as an aid in the diagnosis of influenza from Nasopharyngeal swab specimens and should not be used as a sole basis for treatment. Nasal washings and aspirates are unacceptable for Xpert Xpress SARS-CoV-2/FLU/RSV testing.  Fact Sheet for Patients: EntrepreneurPulse.com.au  Fact Sheet for Healthcare Providers: IncredibleEmployment.be  This test is not yet approved or cleared by the [place] FDA and has been authorized for detection and/or diagnosis of SARS-CoV-2 by FDA under an Emergency Use Authorization (EUA). This EUA will remain in effect (meaning this test can be used) for the duration of the COVID-19 declaration under Section 564(b)(1) of the Act, 21 U.S.C. section 360bbb-3(b)(1), unless the authorization is terminated  or revoked.  Performed at [facility], [place] [address] [place], [place] [address]   MRSA Next Gen by PCR, Nasal     Status: None   Collection Time: 10/10/21  2:30 AM   Specimen: Nasal Mucosa; Nasal Swab  Result Value Ref Range Status   MRSA by PCR Next Gen NOT DETECTED NOT DETECTED Final    Comment: (NOTE) The GeneXpert MRSA Assay (FDA approved for NASAL specimens only), is one component of a comprehensive MRSA colonization surveillance program. It is not intended to diagnose MRSA infection nor to guide or monitor treatment for MRSA infections. Test performance is not FDA approved in patients less than 64 years old. Performed at [facility], [place] [address], [place], [place] [address]   Culture, blood (Routine X 2) w Reflex to ID Panel     Status: Abnormal   Collection Time: 10/11/21  1:12 AM   Specimen: BLOOD  Result Value Ref Range Status   Specimen Description BLOOD LEFT ANTECUBITAL  Final   Special Requests   Final    BOTTLES DRAWN AEROBIC AND ANAEROBIC Blood Culture adequate volume   Culture  Setup Time   Final    GRAM NEGATIVE RODS AEROBIC BOTTLE ONLY Organism ID to follow CRITICAL RESULT CALLED TO, READ BACK BY AND VERIFIED WITHDion Body PHARMD [id number] 10/11/21 A BROWNING Performed at [facility], [place] [address] [place], [place] [address]    Culture ESCHERICHIA COLI (A)  Final   Report Status 10/13/2021 FINAL  Final   Organism ID, Bacteria ESCHERICHIA COLI  Final      Susceptibility   Escherichia coli - MIC*    AMPICILLIN 4 SENSITIVE Sensitive     CEFAZOLIN <=4 SENSITIVE Sensitive     CEFEPIME <=0.12 SENSITIVE Sensitive     CEFTAZIDIME <=1 SENSITIVE Sensitive     CEFTRIAXONE <=0.25 SENSITIVE Sensitive     CIPROFLOXACIN >=4 RESISTANT Resistant     GENTAMICIN <=1 SENSITIVE Sensitive     IMIPENEM <=0.25 SENSITIVE Sensitive     TRIMETH/SULFA >=320 RESISTANT Resistant     AMPICILLIN/SULBACTAM <=2 SENSITIVE Sensitive     PIP/TAZO <=4 SENSITIVE  Sensitive     *  ESCHERICHIA COLI  Blood Culture ID Panel (Reflexed)     Status: Abnormal   Collection Time: 10/11/21  1:12 AM  Result Value Ref Range Status   Enterococcus faecalis NOT DETECTED NOT DETECTED Final   Enterococcus Faecium NOT DETECTED NOT DETECTED Final   Listeria monocytogenes NOT DETECTED NOT DETECTED Final   Staphylococcus species NOT DETECTED NOT DETECTED Final   Staphylococcus aureus (BCID) NOT DETECTED NOT DETECTED Final   Staphylococcus epidermidis NOT DETECTED NOT DETECTED Final   Staphylococcus lugdunensis NOT DETECTED NOT DETECTED Final   Streptococcus species NOT DETECTED NOT DETECTED Final   Streptococcus agalactiae NOT DETECTED NOT DETECTED Final   Streptococcus pneumoniae NOT DETECTED NOT DETECTED Final   Streptococcus pyogenes NOT DETECTED NOT DETECTED Final   A.calcoaceticus-baumannii NOT DETECTED NOT DETECTED Final   Bacteroides fragilis NOT DETECTED NOT DETECTED Final   Enterobacterales DETECTED (A) NOT DETECTED Final    Comment: Enterobacterales represent a large order of gram negative bacteria, not a single organism. CRITICAL RESULT CALLED TO, READ BACK BY AND VERIFIED WITH: Dion Body PHARMD [id number] 10/11/21 A BROWNING    Enterobacter cloacae complex NOT DETECTED NOT DETECTED Final   Escherichia coli DETECTED (A) NOT DETECTED Final    Comment: CRITICAL RESULT CALLED TO, READ BACK BY AND VERIFIED WITH: Dion Body PHARMD [id number] 10/11/21 A BROWNING    Klebsiella aerogenes NOT DETECTED NOT DETECTED Final   Klebsiella oxytoca NOT DETECTED NOT DETECTED Final   Klebsiella pneumoniae NOT DETECTED NOT DETECTED Final   Proteus species NOT DETECTED NOT DETECTED Final   Salmonella species NOT DETECTED NOT DETECTED Final   Serratia marcescens NOT DETECTED NOT DETECTED Final   Haemophilus influenzae NOT DETECTED NOT DETECTED Final   Neisseria meningitidis NOT DETECTED NOT DETECTED Final   Pseudomonas aeruginosa NOT DETECTED NOT DETECTED Final   Stenotrophomonas  maltophilia NOT DETECTED NOT DETECTED Final   Candida albicans NOT DETECTED NOT DETECTED Final   Candida auris NOT DETECTED NOT DETECTED Final   Candida glabrata NOT DETECTED NOT DETECTED Final   Candida krusei NOT DETECTED NOT DETECTED Final   Candida parapsilosis NOT DETECTED NOT DETECTED Final   Candida tropicalis NOT DETECTED NOT DETECTED Final   Cryptococcus neoformans/gattii NOT DETECTED NOT DETECTED Final   CTX-M ESBL NOT DETECTED NOT DETECTED Final   Carbapenem resistance IMP NOT DETECTED NOT DETECTED Final   Carbapenem resistance KPC NOT DETECTED NOT DETECTED Final   Carbapenem resistance NDM NOT DETECTED NOT DETECTED Final   Carbapenem resist OXA 48 LIKE NOT DETECTED NOT DETECTED Final   Carbapenem resistance VIM NOT DETECTED NOT DETECTED Final    Comment: Performed at [facility], [address] [place], [place] [address]  Culture, blood (Routine X 2) w Reflex to ID Panel     Status: Abnormal   Collection Time: 10/11/21  1:27 AM   Specimen: BLOOD RIGHT HAND  Result Value Ref Range Status   Specimen Description BLOOD RIGHT HAND  Final   Special Requests AEROBIC BOTTLE ONLY Blood Culture adequate volume  Final   Culture  Setup Time   Final    GRAM POSITIVE COCCI IN CLUSTERS AEROBIC BOTTLE ONLY CRITICAL RESULT CALLED TO, READ BACK BY AND VERIFIED WITH: PHARMD JESSICA MILLEN 10/11/2021 _0  BY JW    Culture (A)  Final    STAPHYLOCOCCUS HOMINIS THE SIGNIFICANCE OF ISOLATING THIS ORGANISM FROM A SINGLE SET OF BLOOD CULTURES WHEN MULTIPLE SETS ARE DRAWN IS UNCERTAIN. PLEASE NOTIFY THE MICROBIOLOGY DEPARTMENT WITHIN ONE WEEK IF SPECIATION AND  SENSITIVITIES ARE REQUIRED. Performed at [facility], [place] [address], [place], [place] [address]    Report Status 10/12/2021 FINAL  Final  Blood Culture ID Panel (Reflexed)     Status: Abnormal   Collection Time: 10/11/21  1:27 AM  Result Value Ref Range Status   Enterococcus faecalis NOT DETECTED NOT DETECTED Final    Enterococcus Faecium NOT DETECTED NOT DETECTED Final   Listeria monocytogenes NOT DETECTED NOT DETECTED Final   Staphylococcus species DETECTED (A) NOT DETECTED Final    Comment: CRITICAL RESULT CALLED TO, READ BACK BY AND VERIFIED WITH: PHARMD JESSICA MILLEN 10/11/2021 _0  BY JW    Staphylococcus aureus (BCID) NOT DETECTED NOT DETECTED Final   Staphylococcus epidermidis NOT DETECTED NOT DETECTED Final   Staphylococcus lugdunensis NOT DETECTED NOT DETECTED Final   Streptococcus species NOT DETECTED NOT DETECTED Final   Streptococcus agalactiae NOT DETECTED NOT DETECTED Final   Streptococcus pneumoniae NOT DETECTED NOT DETECTED Final   Streptococcus pyogenes NOT DETECTED NOT DETECTED Final   A.calcoaceticus-baumannii NOT DETECTED NOT DETECTED Final   Bacteroides fragilis NOT DETECTED NOT DETECTED Final   Enterobacterales NOT DETECTED NOT DETECTED Final   Enterobacter cloacae complex NOT DETECTED NOT DETECTED Final   Escherichia coli NOT DETECTED NOT DETECTED Final   Klebsiella aerogenes NOT DETECTED NOT DETECTED Final   Klebsiella oxytoca NOT DETECTED NOT DETECTED Final   Klebsiella pneumoniae NOT DETECTED NOT DETECTED Final   Proteus species NOT DETECTED NOT DETECTED Final   Salmonella species NOT DETECTED NOT DETECTED Final   Serratia marcescens NOT DETECTED NOT DETECTED Final   Haemophilus influenzae NOT DETECTED NOT DETECTED Final   Neisseria meningitidis NOT DETECTED NOT DETECTED Final   Pseudomonas aeruginosa NOT DETECTED NOT DETECTED Final   Stenotrophomonas maltophilia NOT DETECTED NOT DETECTED Final   Candida albicans NOT DETECTED NOT DETECTED Final   Candida auris NOT DETECTED NOT DETECTED Final   Candida glabrata NOT DETECTED NOT DETECTED Final   Candida krusei NOT DETECTED NOT DETECTED Final   Candida parapsilosis NOT DETECTED NOT DETECTED Final   Candida tropicalis NOT DETECTED NOT DETECTED Final   Cryptococcus neoformans/gattii NOT DETECTED NOT DETECTED Final     Comment: Performed at [facility], [address] [place], [place] [address]    Janene Madeira, MSN, NP-C [facility] Pager: [phone]  _1 @ 1:19 PM

## 2021-10-13 NOTE — Progress Notes (Addendum)
Progress Note  Patient Name: Molly Hill Date of Encounter: 10/13/2021  [facility] Cardiologist: Kirk Ruths, MD   Subjective   Feels better this morning. Breathing improved. She is not sure she can lay flat for MRI.  CT TAVR protocol with suspicious left kidney lesion. Recommended for abdominal MRI.  Net negative 1.4L Wt 150>147 Cr 1.18>1.1  Inpatient Medications    Scheduled Meds:  amLODipine  2.5 mg Oral Daily   aspirin  81 mg Oral Daily   atorvastatin  80 mg Oral Daily   B-complex with vitamin C  1 tablet Oral Daily   calcium-vitamin D  1 tablet Oral Q breakfast   Chlorhexidine Gluconate Cloth  6 each Topical Daily   cholecalciferol  1,000 Units Oral Daily   docusate sodium  200 mg Oral Daily   insulin aspart  0-5 Units Subcutaneous QHS   insulin aspart  0-9 Units Subcutaneous TID WC   insulin aspart  2 Units Subcutaneous TID WC   insulin NPH Human  20 Units Subcutaneous BID AC & HS   mouth rinse  15 mL Mouth Rinse BID   metoprolol succinate  25 mg Oral Daily   mometasone-formoterol  2 puff Inhalation BID   polyethylene glycol  17 g Oral BID   sodium chloride flush  3 mL Intravenous Q12H   vitamin B-12  1,000 mcg Oral Daily   Continuous Infusions:  sodium chloride Stopped (10/12/21 0607)   cefTRIAXone (ROCEPHIN)  IV Stopped (10/12/21 0434)   heparin 950 Units/hr (10/13/21 0200)   PRN Meds: sodium chloride, acetaminophen, bisacodyl, nitroGLYCERIN, ondansetron (ZOFRAN) IV, sodium chloride, sodium chloride flush   Vital Signs    Vitals:   10/13/21 0000 10/13/21 0100 10/13/21 0200 10/13/21 0354  BP: (!) 121/51 (!) 116/59 (!) 127/49   Pulse: 77 74 72   Resp: _0 Temp:    98.4 F (36.9 C)  TempSrc:    Oral  SpO2: 96% 97% 95%   Weight:      Height:        Intake/Output Summary (Last 24 hours) at 10/13/2021 0613 Last data filed at 10/13/2021 0200 Gross per 24 hour  Intake 430.42 ml  Output 1925 ml  Net -1494.58 ml    Last 3  Weights 10/12/2021 10/10/2021 10/09/2021  Weight (lbs) 150 lb 12.7 oz 155 lb 13.8 oz 148 lb  Weight (kg) 68.4 kg 70.7 kg 67.132 kg      Telemetry    NSR - Personally Reviewed  ECG    No new tracing - Personally Reviewed  Physical Exam   GEN: No acute distress.   Neck: No JVD Cardiac: RRR, 3/6 late peaking systolic murmur  Respiratory: Crackles at the bases bilaterally.  GI: Soft, nontender, non-distended  MS: Trace edema in the hands; no LE edema. Warm Neuro:  Nonfocal  Psych: Normal affect   Labs    High Sensitivity Troponin:   Recent Labs  Lab 10/09/21 1837 10/09/21 2209 10/10/21 0605 10/10/21 1015 10/11/21 0022  TROPONINIHS 15 45* 154* 199* 199*      Chemistry Recent Labs  Lab 10/11/21 0022 10/12/21 0928 10/13/21 0312  NA 130* 132* 131*  K 4.1 3.6 3.9  CL 98 96* 96*  CO2 _1 GLUCOSE 215* 268* 160*  BUN 30* 28* 27*  CREATININE 1.20* 1.18* 1.10*  CALCIUM 8.3* 8.2* 8.3*  MG 1.6* 2.0 2.0  PROT  --  6.1*  --   ALBUMIN  --  2.5*  --   AST  --  38  --   ALT  --  31  --   ALKPHOS  --  76  --   BILITOT  --  0.7  --   GFRNONAA 48* 49* 54*  ANIONGAP _0 Lipids  Recent Labs  Lab 10/10/21 0605  CHOL 99  TRIG 122  HDL 39*  LDLCALC 36  CHOLHDL 2.5     Hematology Recent Labs  Lab 10/11/21 0022 10/12/21 0124 10/13/21 0312  WBC 9.2 6.7 7.6  RBC 2.83* 2.80* 2.77*  HGB 8.9* 8.7* 8.7*  HCT 26.5* 26.1* 25.8*  MCV 93.6 93.2 93.1  MCH 31.4 31.1 31.4  MCHC 33.6 33.3 33.7  RDW 13.6 13.6 13.6  PLT 159 145* 171    Thyroid No results for input(s): TSH, FREET4 in the last 168 hours.  BNP Recent Labs  Lab 10/09/21 2210  BNP 511.4*     DDimer No results for input(s): DDIMER in the last 168 hours.   Radiology    CT CORONARY MORPH W/CTA COR W/SCORE W/CA W/CM &/OR WO/CM  Addendum Date: 10/12/2021   ADDENDUM REPORT: 10/12/2021 21:44 CLINICAL DATA:  Severe Aortic Stenosis. EXAM: Cardiac TAVR CT TECHNIQUE: A non-contrast, gated CT  scan was obtained with axial slices of 3 mm through the heart for aortic valve calcium scoring. A 90 kV retrospective, gated, contrast cardiac scan was obtained. Gantry rotation speed was 250 msecs and collimation was 0.6 mm. Nitroglycerin was not given. The 3D data set was reconstructed in 5% intervals of the 0-95% of the R-R cycle. Systolic and diastolic phases were analyzed on a dedicated workstation using MPR, MIP, and VRT modes. The patient received 100 cc of contrast. FINDINGS: Image quality: Excellent. Noise artifact is: Limited. Valve Morphology: The aortic valve is tricuspid with severe diffuse calcifications. There is acquired fusion of the RCC/LCC. Aortic Valve Calcium score: 798 Aortic annular dimension: Phase assessed: 30% Annular area: 329 mm2 Annular perimeter: 66.0 mm Max diameter: 23.3 mm Min diameter: 18.8 mm Membranous septum length: 8.4 mm Annular and subannular calcification: Trace, layered single calcification under the [place]. Optimal coplanar projection: LAO 21 CRA 6 Coronary Artery Height above Annulus: Left Main: 13.5 mm Right Coronary: 16.7 mm Sinus of Valsalva Measurements: Non-coronary: 28.3 mm Right-coronary: 27.0 mm Left-coronary: 28.6 mm Sinus of Valsalva Height: Non-coronary: 17.6 mm Right-coronary: 21.2 mm Left-coronary: 19.9 mm Sinotubular Junction: 24.1 mm.  Mild calcifications. Ascending Thoracic Aorta: 25.9 mm.  Mild calcifications. Coronary Arteries: Normal coronary origin. Right dominance. The study was performed without use of NTG and is insufficient for plaque evaluation. Please refer to recent cardiac catheterization for coronary assessment. Severe 3-vessel coronary calcifications noted. Cardiac Morphology: Right Atrium: Right atrial size is within normal limits. Right Ventricle: The right ventricular cavity is within normal limits. Left Atrium: Left atrial size is dilated with no left atrial appendage filling defect. Small PFO present. Left Ventricle: The ventricular cavity  size is within normal limits. There are no stigmata of prior infarction. There is no abnormal filling defect. Normal left ventricular function, EF=71%. No regional wall motion abnormalities. Pulmonary arteries: Normal in size without proximal filling defect. Pulmonary veins: Normal pulmonary venous drainage. Pericardium: Normal thickness with no significant effusion or calcium present. Mitral Valve: The mitral valve is degenerative with moderate to severe mitral annular calcification. Extra-cardiac findings: See attached radiology report for non-cardiac structures. IMPRESSION: 1. Tricuspid aortic valve with severe diffuse calcifications. There is acquired fusion of  the RCC/LCC. 2. Small aortic valve annulus (329 mm2). Would consider a 26 mm Evolut Pro as sinus measurements are appropriate and good sinus heights noted. No significant annular or subannular calcifications. 3. Sufficient coronary to annulus distance. 4. Optimal Fluoroscopic Angle for Delivery: LAO 21 CRA 6 [place] T. Audie Box, MD Electronically Signed   By: Eleonore Chiquito M.D.   On: 10/12/2021 21:44   Result Date: 10/12/2021 EXAM: OVER-READ INTERPRETATION  CT CHEST The following report is an over-read performed by radiologist Dr. Vinnie Langton of [facility], [place] on 10/12/2021. This over-read does not include interpretation of cardiac or coronary anatomy or pathology. The coronary calcium score/coronary CTA interpretation by the cardiologist is attached. COMPARISON:  Chest CT 01/15/2021. FINDINGS: Extracardiac findings will be described separately under dictation for contemporaneously obtained CTA chest, abdomen and pelvis. IMPRESSION: Please see separate dictation for contemporaneously obtained CTA chest, abdomen and pelvis dated 10/12/2021 for full description of relevant extracardiac findings. Electronically Signed: By: Vinnie Langton M.D. On: 10/12/2021 09:21   CT ANGIO CHEST AORTA W/CM & OR WO/CM  Result Date: 10/12/2021 CLINICAL  DATA:  70 year old female with history of severe aortic stenosis. Preprocedural study prior to potential transcatheter aortic valve replacement (TAVR) procedure. EXAM: CT ANGIOGRAPHY CHEST, ABDOMEN AND PELVIS TECHNIQUE: Multidetector CT imaging through the chest, abdomen and pelvis was performed using the standard protocol during bolus administration of intravenous contrast. Multiplanar reconstructed images and MIPs were obtained and reviewed to evaluate the vascular anatomy. CONTRAST:  198m OMNIPAQUE IOHEXOL 350 MG/ML SOLN COMPARISON:  Chest CT 01/15/2021. CT the abdomen and pelvis 12/30/2009. FINDINGS: CTA CHEST FINDINGS Cardiovascular: Heart size is mildly enlarged. There is no significant pericardial fluid, thickening or pericardial calcification. There is aortic atherosclerosis, as well as atherosclerosis of the great vessels of the mediastinum and the coronary arteries, including calcified atherosclerotic plaque in the left main, left anterior descending, left circumflex and right coronary arteries. Severe thickening and calcification of the aortic valve. Calcifications of the mitral annulus. Mediastinum/Lymph Nodes: No pathologically enlarged mediastinal or hilar lymph nodes. Esophagus is unremarkable in appearance. No axillary lymphadenopathy. Lungs/Pleura: Chronic area of ground-glass attenuation, septal thickening and regional architectural distortion in the apex of the right upper lobe, similar to the prior study, likely some chronic fibrosis related to prior right axillary radiation therapy. 3 mm right upper lobe pulmonary nodule (axial image 44 of series 5). No acute consolidative airspace disease. Trace bilateral pleural effusions lying dependently (left-greater-than-right). Musculoskeletal/Soft Tissues: There are no aggressive appearing lytic or blastic lesions noted in the visualized portions of the skeleton. CTA ABDOMEN AND PELVIS FINDINGS Hepatobiliary: No suspicious cystic or solid hepatic  lesions. No intra or extrahepatic biliary ductal dilatation. Status post cholecystectomy. Pancreas: No pancreatic mass. No pancreatic ductal dilatation. No pancreatic or peripancreatic fluid collections or inflammatory changes. Spleen: Unremarkable. Adrenals/Urinary Tract: There are 2 heterogeneously enhancing lesions in the left kidney which are unusual in appearance, but concerning for potential neoplasm, largest of which is in the lateral aspect of the upper pole of the left kidney (axial image 122 of series 4) measuring 4.0 x 2.3 cm. The smaller lesion is in the posterolateral aspect of the interpolar region of the left kidney (axial image 132 of series 4) measuring 1.8 x 1.1 cm. 1.3 cm low-attenuation lesion in the lateral aspect of the interpolar region of the right kidney is compatible with a simple cyst. Bilateral adrenal glands are normal in appearance. No hydroureteronephrosis. Urinary bladder is nearly decompressed, but otherwise unremarkable in appearance. Stomach/Bowel:  The appearance of the stomach is normal. There is no pathologic dilatation of small bowel or colon. Numerous colonic diverticulae are noted, without surrounding inflammatory changes to suggest an acute diverticulitis at this time. Normal appendix. Vascular/Lymphatic: Aortic atherosclerosis, without evidence of aneurysm or dissection in the abdominal or pelvic vasculature. Vascular findings and measurements pertinent to potential TAVR procedure, as detailed below. No lymphadenopathy noted in the abdomen or pelvis. Reproductive: Status post hysterectomy.  Ovaries are atrophic. Other: No significant volume of ascites.  No pneumoperitoneum. Musculoskeletal: There are no aggressive appearing lytic or blastic lesions noted in the visualized portions of the skeleton. VASCULAR MEASUREMENTS PERTINENT TO TAVR: AORTA: Minimal Aortic Diameter-7 x 8 mm Severity of Aortic Calcification-severe RIGHT PELVIS: Right Common Iliac Artery - Minimal  Diameter-7.4 x 8.5 mm Tortuosity-mild Calcification-mild Right External Iliac Artery - Minimal Diameter-6.5 x 6.8 mm Tortuosity-mild Calcification-minimal Right Common Femoral Artery - Minimal Diameter-6.7 x 5.8 mm Tortuosity-mild Calcification-mild LEFT PELVIS: Left Common Iliac Artery - Minimal Diameter-6.6 x 7.3 mm Tortuosity-mild Calcification-mild Left External Iliac Artery - Minimal Diameter-7.3 x 6.5 mm Tortuosity-mild Calcification-minimal Left Common Femoral Artery - Minimal Diameter-6.0 x 6.2 mm Tortuosity-mild Calcification-mild Review of the MIP images confirms the above findings. IMPRESSION: 1. Vascular findings and measurements pertinent to potential TAVR procedure, as detailed above. 2. Severe thickening calcification of the aortic valve, compatible with reported clinical history of severe aortic stenosis. 3. 2 unusual appearing heterogeneously enhancing lesions in the left kidney, concerning for neoplasm. Further evaluation with nonemergent abdominal MRI with and without IV gadolinium is strongly recommended in the near future to provide more definitive characterization. 4. 3 mm right upper lobe pulmonary nodule, nonspecific, but statistically likely benign. No follow-up needed if patient is low-risk. Non-contrast chest CT can be considered in 12 months if patient is high-risk. This recommendation follows the consensus statement: Guidelines for Management of Incidental Pulmonary Nodules Detected on CT Images: From the Fleischner Society 2017; Radiology 2017; 284:228-243. 5. Trace bilateral pleural effusions (left-greater-than-right). 6. Colonic diverticulosis without evidence of acute diverticulitis at this time. Electronically Signed   By: Vinnie Langton M.D.   On: 10/12/2021 13:04   VAS US DOPPLER PRE CABG  Result Date: 10/11/2021 PREOPERATIVE VASCULAR EVALUATION Patient Name:  Molly Hill  Date of Exam:   10/11/2021 Medical Rec #: [id number]       Accession #:    [id number] Date of Birth:  23-Dec-1950      Patient Gender: F Patient Age:   18 years Exam Location:  [facility] Procedure:      VAS US DOPPLER PRE CABG Referring Phys: Gilford Raid --------------------------------------------------------------------------------  Indications:      Pre-CABG. Risk Factors:     Hypertension, hyperlipidemia, Diabetes. Comparison Study: carotid duplex done 07/25/21 Performing Technologist: Archie Patten RVS  Examination Guidelines: A complete evaluation includes B-mode imaging, spectral Doppler, color Doppler, and power Doppler as needed of all accessible portions of each vessel. Bilateral testing is considered an integral part of a complete examination. Limited examinations for reoccurring indications may be performed as noted.  ABI Findings: +---------+------------------+-----+---------+--------+  Right     Rt Pressure (mmHg) Index Waveform  Comment   +---------+------------------+-----+---------+--------+  Brachial  130                      triphasic           +---------+------------------+-----+---------+--------+  PTA       212  1.63  triphasic           +---------+------------------+-----+---------+--------+  DP        255                1.96  triphasic           +---------+------------------+-----+---------+--------+  Great Toe 104                0.80  Normal              +---------+------------------+-----+---------+--------+ +---------+------------------+-----+---------+-------+  Left      Lt Pressure (mmHg) Index Waveform  Comment  +---------+------------------+-----+---------+-------+  Brachial  118                      triphasic          +---------+------------------+-----+---------+-------+  PTA       255                1.96  triphasic          +---------+------------------+-----+---------+-------+  DP        195                1.50  triphasic          +---------+------------------+-----+---------+-------+  Great Toe 112                0.86  Normal              +---------+------------------+-----+---------+-------+ +-------+---------------+----------------+  ABI/TBI Today's ABI/TBI Previous ABI/TBI  +-------+---------------+----------------+  Right   0.96            0.80              +-------+---------------+----------------+  Left    0.96            0.86              +-------+---------------+----------------+  Right Doppler Findings: +--------+--------+-----+---------+--------+  Site     Pressure Index Doppler   Comments  +--------+--------+-----+---------+--------+  Brachial 130            triphasic           +--------+--------+-----+---------+--------+  Radial                  triphasic           +--------+--------+-----+---------+--------+  Ulnar                   triphasic           +--------+--------+-----+---------+--------+  Left Doppler Findings: +--------+--------+-----+---------+--------+  Site     Pressure Index Doppler   Comments  +--------+--------+-----+---------+--------+  Brachial 118            triphasic           +--------+--------+-----+---------+--------+  Radial                  triphasic           +--------+--------+-----+---------+--------+  Ulnar                   triphasic           +--------+--------+-----+---------+--------+  Right ABI: Resting right ankle-brachial index indicates noncompressible right lower extremity arteries. The right toe-brachial index is normal. Left ABI: Resting left ankle-brachial index indicates noncompressible left lower extremity arteries. The left toe-brachial index is normal. Right Upper Extremity: Doppler waveforms remain within normal limits with right radial compression. Doppler waveforms decrease 50% with right ulnar compression. Left  Upper Extremity: Doppler waveforms decrease 50% with left radial compression. Doppler waveforms remain within normal limits with left ulnar compression.  Electronically signed by Harold Barban MD on 10/11/2021 at 6:43:09 PM.    Final    CT Angio Abd/Pel w/ and/or w/o  Result  Date: 10/12/2021 CLINICAL DATA:  70 year old female with history of severe aortic stenosis. Preprocedural study prior to potential transcatheter aortic valve replacement (TAVR) procedure. EXAM: CT ANGIOGRAPHY CHEST, ABDOMEN AND PELVIS TECHNIQUE: Multidetector CT imaging through the chest, abdomen and pelvis was performed using the standard protocol during bolus administration of intravenous contrast. Multiplanar reconstructed images and MIPs were obtained and reviewed to evaluate the vascular anatomy. CONTRAST:  156m OMNIPAQUE IOHEXOL 350 MG/ML SOLN COMPARISON:  Chest CT 01/15/2021. CT the abdomen and pelvis 12/30/2009. FINDINGS: CTA CHEST FINDINGS Cardiovascular: Heart size is mildly enlarged. There is no significant pericardial fluid, thickening or pericardial calcification. There is aortic atherosclerosis, as well as atherosclerosis of the great vessels of the mediastinum and the coronary arteries, including calcified atherosclerotic plaque in the left main, left anterior descending, left circumflex and right coronary arteries. Severe thickening and calcification of the aortic valve. Calcifications of the mitral annulus. Mediastinum/Lymph Nodes: No pathologically enlarged mediastinal or hilar lymph nodes. Esophagus is unremarkable in appearance. No axillary lymphadenopathy. Lungs/Pleura: Chronic area of ground-glass attenuation, septal thickening and regional architectural distortion in the apex of the right upper lobe, similar to the prior study, likely some chronic fibrosis related to prior right axillary radiation therapy. 3 mm right upper lobe pulmonary nodule (axial image 44 of series 5). No acute consolidative airspace disease. Trace bilateral pleural effusions lying dependently (left-greater-than-right). Musculoskeletal/Soft Tissues: There are no aggressive appearing lytic or blastic lesions noted in the visualized portions of the skeleton. CTA ABDOMEN AND PELVIS FINDINGS Hepatobiliary: No suspicious  cystic or solid hepatic lesions. No intra or extrahepatic biliary ductal dilatation. Status post cholecystectomy. Pancreas: No pancreatic mass. No pancreatic ductal dilatation. No pancreatic or peripancreatic fluid collections or inflammatory changes. Spleen: Unremarkable. Adrenals/Urinary Tract: There are 2 heterogeneously enhancing lesions in the left kidney which are unusual in appearance, but concerning for potential neoplasm, largest of which is in the lateral aspect of the upper pole of the left kidney (axial image 122 of series 4) measuring 4.0 x 2.3 cm. The smaller lesion is in the posterolateral aspect of the interpolar region of the left kidney (axial image 132 of series 4) measuring 1.8 x 1.1 cm. 1.3 cm low-attenuation lesion in the lateral aspect of the interpolar region of the right kidney is compatible with a simple cyst. Bilateral adrenal glands are normal in appearance. No hydroureteronephrosis. Urinary bladder is nearly decompressed, but otherwise unremarkable in appearance. Stomach/Bowel: The appearance of the stomach is normal. There is no pathologic dilatation of small bowel or colon. Numerous colonic diverticulae are noted, without surrounding inflammatory changes to suggest an acute diverticulitis at this time. Normal appendix. Vascular/Lymphatic: Aortic atherosclerosis, without evidence of aneurysm or dissection in the abdominal or pelvic vasculature. Vascular findings and measurements pertinent to potential TAVR procedure, as detailed below. No lymphadenopathy noted in the abdomen or pelvis. Reproductive: Status post hysterectomy.  Ovaries are atrophic. Other: No significant volume of ascites.  No pneumoperitoneum. Musculoskeletal: There are no aggressive appearing lytic or blastic lesions noted in the visualized portions of the skeleton. VASCULAR MEASUREMENTS PERTINENT TO TAVR: AORTA: Minimal Aortic Diameter-7 x 8 mm Severity of Aortic Calcification-severe RIGHT PELVIS: Right Common Iliac  Artery - Minimal Diameter-7.4 x 8.5 mm Tortuosity-mild Calcification-mild Right  External Iliac Artery - Minimal Diameter-6.5 x 6.8 mm Tortuosity-mild Calcification-minimal Right Common Femoral Artery - Minimal Diameter-6.7 x 5.8 mm Tortuosity-mild Calcification-mild LEFT PELVIS: Left Common Iliac Artery - Minimal Diameter-6.6 x 7.3 mm Tortuosity-mild Calcification-mild Left External Iliac Artery - Minimal Diameter-7.3 x 6.5 mm Tortuosity-mild Calcification-minimal Left Common Femoral Artery - Minimal Diameter-6.0 x 6.2 mm Tortuosity-mild Calcification-mild Review of the MIP images confirms the above findings. IMPRESSION: 1. Vascular findings and measurements pertinent to potential TAVR procedure, as detailed above. 2. Severe thickening calcification of the aortic valve, compatible with reported clinical history of severe aortic stenosis. 3. 2 unusual appearing heterogeneously enhancing lesions in the left kidney, concerning for neoplasm. Further evaluation with nonemergent abdominal MRI with and without IV gadolinium is strongly recommended in the near future to provide more definitive characterization. 4. 3 mm right upper lobe pulmonary nodule, nonspecific, but statistically likely benign. No follow-up needed if patient is low-risk. Non-contrast chest CT can be considered in 12 months if patient is high-risk. This recommendation follows the consensus statement: Guidelines for Management of Incidental Pulmonary Nodules Detected on CT Images: From the Fleischner Society 2017; Radiology 2017; 284:228-243. 5. Trace bilateral pleural effusions (left-greater-than-right). 6. Colonic diverticulosis without evidence of acute diverticulitis at this time. Electronically Signed   By: Vinnie Langton M.D.   On: 10/12/2021 13:04    Cardiac Studies   TTE 10/10/21: IMPRESSIONS   1. Normal LV function; calcified aortic valve with severe AS (mean  gradient 40 mmHg; DI 0.21); mac with mild MS (mean gradient 5 mmHg) and  mild  MR.   2. Left ventricular ejection fraction, by estimation, is 60 to 65%. The  left ventricle has normal function. The left ventricle has no regional  wall motion abnormalities. There is mild left ventricular hypertrophy of  the basal-septal segment. Left  ventricular diastolic parameters are consistent with Grade I diastolic  dysfunction (impaired relaxation). Elevated left atrial pressure. The  average left ventricular global longitudinal strain is -18.4 %. The global  longitudinal strain is normal.   3. Right ventricular systolic function is normal. The right ventricular  size is normal.   4. The mitral valve is normal in structure. Mild mitral valve  regurgitation. Mild mitral stenosis. Moderate mitral annular  calcification.   5. The aortic valve is calcified. Aortic valve regurgitation is mild.  Severe aortic valve stenosis.   6. The inferior vena cava is normal in size with greater than 50%  respiratory variability, suggesting right atrial pressure of 3 mmHg.   Cath 10/10/21:   Ost RCA lesion is 75% stenosed.   RPAV lesion is 90% stenosed.   Ost LAD to Prox LAD lesion is 40% stenosed.   Previously placed Prox LAD to Mid LAD stent (unknown type) is  widely patent.  Patient Profile     70 y.o. female with past medical history of coronary artery disease status post PCI of the LAD 2013, mild to moderate aortic stenosis, diabetes mellitus, hypertension, hyperlipidemia, breast cancer admitted with chest pain and dyspnea.  Electrocardiogram showed diffuse ST depression. Cardiac catheterization revealed 75% ostial right coronary artery, 90% RP AV and 40% ostial to proximal LAD; left ventricular end-diastolic pressure 8 mmHg. TTE with normal EF 60-65%. severe AS with mean gradient 58mHg, DI 0.21. Now being worked up for possible PCI/TAVR vs CABG/AVR (high risk surgery candidate due to history of XRT). Course has been complicated by E. Coli bacteremia now on CTX.   Assessment & Plan     #NSTEMI: #Multivessel CAD:  Patient presented with progressive chest pain consistent with classic angina. Cardiac catheterization 12/26 revealed 75% ostial right coronary artery, 90% RP AV and 40% ostial to proximal LAD; left ventricular end-diastolic pressure 8 mmHg. Was recommended for medical therapy, however, patient continued to have chest pain with minimal movement and intermittently at rest. TTE with LVEF 60-65% with severe aortic stenosis. Re-evaluation of cath films with interventional team suggestive that ostial RCA and prox LAD lesions likely hemodynamically significant. Seen by CV surgery and deemed high risk for CABG/AVR due to history of chest XRT. Will be evaluated by Structural team for consideration of PCI/TAVR. -Structural heart team to see for consideration of PCI/TAVR as patient deemed high surgical risk given history of chest XRT -Continue heparin gtt for now -Continue ASA 84m daily -Continue lipitor 872mdaily -Continue metoprolol 2538maily -Re-dose lasix 84m54m today  #Severe Aortic Stenosis: TTE 12/26 with EF 60-65%, AVA 0.54, mean gradient 84mm89mDI 0.21. Determined to be high risk surgical candidate due to history of XRT to the chest for breast cancer. Now awaiting evaluation by structural heart team. -CV surgery and structural heart teams consulted; appreciate recommendations -Deemed high risk for CABG/AVR due to history of chest XRT for breast cancer  #E.Coli Bacteremia: #Fevers: Patient with fever to 102 with blood culture from 12/27 positive for E.Coli. No urinary symptoms or GI symptoms except mild constipation. Started on ceftriaxone and ID consulted. -ID consulted, appreciate recommendations -Continue CTX (sensitive based on susceptibility) -Surveillance blood cultures  #Acute on Chronic Diastolic Heart Failure Exacerbation: LVEDP on cath 8mmHg76mowever, has required intermittent lasix. Will re-dose today. Dry weight ~146lbs. -Give lasix 84mg I41mday and  monitor response -Need to be judicious with diuresis given severe AS as patient preload dependent -Continue metoprolol 25mg da50m-Will add spiro/SGLT2i as able post-procedure -Low Na Diet -Monitor I/Os and daily weights  #Acute Hypoxic Respiratory Failure: Improved with diuresis.  -Wean O2 as able today -Continue diuresis as above  #Left Renal Enhancing Lesions: Noted on CT TAVR. Concerning for neoplasm. Recommended for abdominal MRI. -Obtain abdominal MRI  #Anemia: Hemoglobin 8.7, which is stable. No signs or symptoms of bleeding while on heparin gtt.  -Continue to monitor with daily CBC  #HTN: Controlled.  -Continue amlodipine 2.5mg dail64mContinue metoprolol 25mg XL d69m  #HLD: -Continue lipitor 80mg daily80mMII: -ISS  #Constipation: Resolved.  -Continue colace -Continue miralax -Bisacodyl prn   For questions or updates, please contact [facility]CP[facility]iaPlease consult [email or web address] for contact info under        Signed, Mashawn Brazil E PFreada Bergeron/2022, 6:13 AM

## 2021-10-13 NOTE — Progress Notes (Signed)
Inpatient Diabetes Program Recommendations  AACE/ADA: New Consensus Statement on Inpatient Glycemic Control (2015)  Target Ranges:  Prepandial:   less than 140 mg/dL      Peak postprandial:   less than 180 mg/dL (1-2 hours)      Critically ill patients:  140 - 180 mg/dL   Lab Results  Component Value Date   GLUCAP 67 (L) 10/13/2021   HGBA1C 7.8 (H) 10/09/2021    Review of Glycemic Control  Latest Reference Range & Units 10/13/21 07:56 10/13/21 11:36 10/13/21 13:04  Glucose-Capillary 70 - 99 mg/dL 101 (H) 86 67 (L)  (H): Data is abnormally high (L): Data is abnormally low  Diabetes history: DM Outpatient Diabetes medications: NPH 18 units BID, Novolin R 3-5 units TID Current orders for Inpatient glycemic control: NPH 20 units BID, Novolog 0-9 units tid and Novolog 2 units TID with meals   Inpatient Diabetes Program Recommendations:    Patient running lower today and is currently NPO for MRI.  Might consider decreasing NPH to 10 BID and holding dose tonight as she had 20 units this am.  Will follow trends.  Patient did not want to switch her basal to levemir or semglee.    Will continue to follow while inpatient.  Thank you, Reche Dixon, RN, BSN Diabetes Coordinator Inpatient Diabetes Program [phone] (team pager from 8a-5p)

## 2021-10-13 NOTE — Progress Notes (Signed)
ANTICOAGULATION CONSULT NOTE   Pharmacy Consult for IV Heparin Indication: chest pain/ACS  Allergies  Allergen Reactions   Codeine     Hyper with vomiting   Onion Diarrhea, Nausea And Vomiting and Other (See Comments)    syncope   Reglan [Metoclopramide]     "pulls muscle to side" "feel paralyzed"    Patient Measurements: Height: 5' 1.25" (155.6 cm) Weight: 67 kg (147 lb 11.3 oz) IBW/kg (Calculated) : 48.38 Heparin Dosing Weight: 63 kg  Vital Signs: Temp: 98.6 F (37 C) (12/29 1137) Temp Source: Oral (12/29 1137) BP: 104/63 (12/29 1100) Pulse Rate: 70 (12/29 1100)  Labs: Recent Labs    10/11/21 0022 10/11/21 1500 10/12/21 0124 10/12/21 0928 10/13/21 0312  HGB 8.9*  --  8.7*  --  8.7*  HCT 26.5*  --  26.1*  --  25.8*  PLT 159  --  145*  --  171  HEPARINUNFRC QUESTIONABLE RESULTS, RECOMMEND RECOLLECT TO VERIFY 0.44 0.42  --  0.50  CREATININE 1.20*  --   --  1.18* 1.10*  TROPONINIHS 199*  --   --   --   --      Estimated Creatinine Clearance: 41.3 mL/min (A) (by C-G formula based on SCr of 1.1 mg/dL (H)).   Medical History: Past Medical History:  Diagnosis Date   Alopecia    Anemia    Bicuspid aortic valve    mild to moderate AS by echo 2022   Carotid stenosis    1-39% bilateral carotid stenosis by dopplers 07/2021   Convalescence after chemotherapy    Coronary arteriosclerosis in native artery 2013   s/p PCI of LAD   DM (diabetes mellitus), type 1 (HCC)    Estrogen receptor negative    History of breast cancer    Hyperglycemia    Hyperlipidemia    Hypertension    Mild dehydration    Mitral regurgitation    mild to moderate by echo 2020   Murmur, heart    Swelling of joint of both wrists    Tendinitis of left wrist     Medications:    Assessment: 70 y.o. F presents with CP/ STEMI. S/p cath lab 12/26 - no interventions done. Plan to start heparin 4 hours post TR band removal (removed at 0110). No AC PTA. Hgb 10.9 on admission. Noted pt with  coughing and pink tinged sputum.  Heparin level remains therapeutic on 950 units/hr.  No overt bleeding or complications noted. Hgb down slightly from admission 10.9 > 8.7.  Pltc stable.  Goal of Therapy:  Heparin level 0.3-0.7 units/ml Monitor platelets by anticoagulation protocol: Yes   Plan:  Continue IV heparin 950 units/hr. Daily heparin level and CBC.  Molly Hill, Molly Hill, BCCP Clinical Pharmacist  10/13/2021 1:30 PM   [facility] pharmacy phone numbers are listed on [email or web address]

## 2021-10-13 NOTE — Consult Note (Addendum)
°[facility]   °MULTIDISCIPLINARY HEART VALVE TEAM ° °Cardiology Consultation:  ° °Patient ID: Molly Hill °MRN: [id number]; DOB: 07/31/1951 ° °Admit date: 10/09/2021 °Date of Consult: 10/13/2021 ° °Primary Care Provider: No primary care provider on file. °[facility] Cardiologist: Brian Crenshaw, MD  ° °Patient Profile:  ° °Molly Hill is a 70 y.o. female with a hx of breast cancer s/p radiation to the right and bilateral mastectomy, hypertension, hyperlipidemia, DM Type I on insulin, remote tobacco abuse, anemia, mild carotid artery stenosis, CAD with prior stenting to the mLAD 2013, and bicuspid aortic valve disease who is being seen today for the evaluation of severe aortic stenosis at the request of Dr. Pemberton. ° °History of Present Illness:  ° °Molly Hill is a very functional 70yo F who lives at alone in [place] but who also cares for her elderly 91yo mother. She reports that she has been doing very well up until about mid year. She states that she travels back and forth to her mothers house and Molly often have many bags to carry back and forth to the car. She noticed that her stamia to carry heavy luggage and groceries began to diminish. She also noticed increased fatigue and some exertional SOB however contributed this to staying very busy and active. She denies LE edema, orthopnea, dizziness or syncope. She began to have chest pain about 2-3 weeks ago. Symptoms were brief in duration therefore she did not seek further evaluation. She was seen by a pulmonologist for her SOB and was prescribed several inhalers which only marginally worked. On Christmas day she was at her daughters house. She was walking to the restroom and found herself unusually short of breath. She therefore drove herself and her mother back to [place], dropped her mother off and called EMS for transport to the ED for evaluation. On arrival, she was found to have unstable angina with subtle ST changes on her EKG in aVR  with inferior depressions. Initial HsT was 45 with a peak at 199. She was placed on IV Heparin and underwent cardiac catheterization 10/11/21 which showed high grade ostial RCA with a 75% stenosis, RPAV with a 90% stenosis, and 40% stenosis of ostial LAD to proximal LAD, and previous stent proximal to mid LAD widely patent. Echo performed 10/11/2021 showed LVEF 60-65%, LV with no region wall abnormalities, mild MR and MS, mild AI and severe AS with a peak gradient of 60.2 mmHg and aortic valve area by VTI 0.54 cm2. Cardiac surgery was consulted for possible CABG/AVR versus PCI/TAVR. Prior chest imaging shows radiation changes in the right apex along with ascending aorta calcifications extending from the root to the arch felt to be secondary to chest radiation. Per Dr. Bartle, this can cause significant pericardial adhesions and render the internal mammary arteries unsuitable for use as bypass conduits. Given this, she was felt to be higher risk for conventional surgery due to the need to replace both ascending aorta and aortic root. Case was discussed with Dr. Rich Paprocki for possible PCI followed by TAVR. Unfortunately the patient became febrile and blood cultures were obtained which were positive for E.Coli. ID was consulted and she was placed on IV antibiotics with continued surveillance. She has also been treated for acute CHF with IV Lasix due to increased weight, and mildly elevated LVEDP on cath. On Dr. Bartle's evaluation, plan was to move forward with TAVR imaging for further evaluation. CT imaging 10/12/21 showed a small aortic valve annulus (329 mm2) felt   to be suitable for 26 mm Evolut Pro. There was 2 left kidney lesions concerning for neoplasm. Follow up MRI was ordered and performed with pending results.   Past Medical History:  Diagnosis Date   Alopecia    Anemia    Bicuspid aortic valve    mild to moderate AS by echo 2022   Carotid stenosis    1-39% bilateral carotid stenosis by dopplers 07/2021    Convalescence after chemotherapy    Coronary arteriosclerosis in native artery 2013   s/p PCI of LAD   DM (diabetes mellitus), type 1 (HCC)    Estrogen receptor negative    History of breast cancer    Hyperglycemia    Hyperlipidemia    Hypertension    Mild dehydration    Mitral regurgitation    mild to moderate by echo 2020   Murmur, heart    Swelling of joint of both wrists    Tendinitis of left wrist     Past Surgical History:  Procedure Laterality Date   ANKLE SURGERY Bilateral    CATARACT EXTRACTION, BILATERAL     CHOLECYSTECTOMY     CORONARY STENT INTERVENTION     CORONARY/GRAFT ACUTE MI REVASCULARIZATION N/A 10/10/2021   Procedure: CORONARY/GRAFT ACUTE MI REVASCULARIZATION;  Surgeon: Lorretta Harp, MD;  Location: [facility];  Service: Cardiovascular;  Laterality: N/A;   CTR     ELBOW SURGERY     HAND SURGERY     BOTH THUMBS AND MIDDLE FINGERS   LEFT BREAST MASTECTOMY     MASCULAR  REPAIR     PORT-A-CATH REMOVAL     PORTACATH PLACEMENT     RIGHT MODIFIED MASTECTOMY Right    UPPER LIP SURGERY      Inpatient Medications: Scheduled Meds:  amLODipine  2.5 mg Oral Daily   aspirin  81 mg Oral Daily   atorvastatin  80 mg Oral Daily   B-complex with vitamin C  1 tablet Oral Daily   calcium-vitamin D  1 tablet Oral Q breakfast   Chlorhexidine Gluconate Cloth  6 each Topical Daily   cholecalciferol  1,000 Units Oral Daily   docusate sodium  200 mg Oral Daily   insulin aspart  0-5 Units Subcutaneous QHS   insulin aspart  0-9 Units Subcutaneous TID WC   insulin aspart  2 Units Subcutaneous TID WC   insulin NPH Human  20 Units Subcutaneous BID AC & HS   mouth rinse  15 mL Mouth Rinse BID   metoprolol succinate  25 mg Oral Daily   mometasone-formoterol  2 puff Inhalation BID   polyethylene glycol  17 g Oral BID   sodium chloride flush  3 mL Intravenous Q12H   vitamin B-12  1,000 mcg Oral Daily   Continuous Infusions:  sodium chloride Stopped (10/12/21  0607)    ceFAZolin (ANCEF) IV 2 g (10/13/21 1348)   heparin 950 Units/hr (10/13/21 1100)   PRN Meds: sodium chloride, acetaminophen, bisacodyl, nitroGLYCERIN, ondansetron (ZOFRAN) IV, sodium chloride, sodium chloride flush  Allergies:    Allergies  Allergen Reactions   Codeine     Hyper with vomiting   Onion Diarrhea, Nausea And Vomiting and Other (See Comments)    syncope   Reglan [Metoclopramide]     "pulls muscle to side" "feel paralyzed"    Social History:   Social History   Socioeconomic History   Marital status: Widowed    Spouse name: Not on file   Number of children: Not on file  Years of education: Not on file   Highest education level: Not on file  Occupational History   Not on file  Tobacco Use   Smoking status: Former    Years: 3.00    Types: Cigarettes    Quit date: 08/15/1979    Years since quitting: 42.1   Smokeless tobacco: Never   Tobacco comments:    1 pack would last a week.  Substance and Sexual Activity   Alcohol use: Never   Drug use: Never   Sexual activity: Never  Other Topics Concern   Not on file  Social History Narrative   Not on file   Social Determinants of Health   Financial Resource Strain: Not on file  Food Insecurity: Not on file  Transportation Needs: Not on file  Physical Activity: Not on file  Stress: Not on file  Social Connections: Not on file  Intimate Partner Violence: Not on file    Family History:    Family History  Problem Relation Age of Onset   Heart attack Mother    CAD Father        CABG   Diabetes Father    Stroke Father    Colon cancer Father        58s   Diabetes Brother    Diabetes Paternal Grandfather    Heart attack Paternal Grandfather    Breast cancer Maternal Aunt        29s   ROS:  Please see the history of present illness.   All other ROS reviewed and negative.     Physical Exam/Data:   Vitals:   10/13/21 0911 10/13/21 1000 10/13/21 1100 10/13/21 1137  BP:  (!) 122/53 104/63    Pulse:  81 70   Resp:  18 20   Temp:    98.6 F (37 C)  TempSrc:    Oral  SpO2: 100% 99% 93%   Weight:      Height:        Intake/Output Summary (Last 24 hours) at 10/13/2021 1405 Last data filed at 10/13/2021 1100 Gross per 24 hour  Intake 418.95 ml  Output 1525 ml  Net -1106.05 ml   Last 3 Weights 10/13/2021 10/12/2021 10/10/2021  Weight (lbs) 147 lb 11.3 oz 150 lb 12.7 oz 155 lb 13.8 oz  Weight (kg) 67 kg 68.4 kg 70.7 kg     Body mass index is 27.68 kg/m.   General: Well developed, well nourished, NAD Lungs:Clear to ausculation bilaterally. No wheezes, rales, or rhonchi. Breathing is unlabored. Cardiovascular: RRR with S1 S2. + systolic murmur Abdomen: Soft, non-tender, non-distended. No obvious abdominal masses. Extremities: No edema. Neuro: Alert and oriented. No focal deficits. No facial asymmetry. MAE spontaneously. Psych: Responds to questions appropriately with normal affect.    EKG:  The EKG was personally reviewed and demonstrates:  10/11/21 with HR 86bpm with inferior TW abnormalities and subtle ST elevation in aVR  Telemetry:  Telemetry was personally reviewed and demonstrates:  10/13/21 NSR with rates in the 70-80's   Relevant CV Studies:  Cardiac catheterization 10/10/21:    Ost RCA lesion is 75% stenosed.   RPAV lesion is 90% stenosed.   Ost LAD to Prox LAD lesion is 40% stenosed.   Previously placed Prox LAD to Mid LAD stent (unknown type) is  widely patent.   IMPRESSION: Molly Hill  has a widely patent LAD stent and moderate segmental disease in the proximal LAD.  There is no other disease in the left system.  She does have a 75% true ostial RCA with mild damping of a 5 [place] catheter.  I reviewed her angiograms with Dr. De Nurse who agrees that there is no culprit lesion in the LAD territory.  In addition, her LVEDP was only 8.  We Molly treat her medically at this time, cycle her enzymes and heparinize her for the time being.  The radial sheath was  removed and a TR band was placed on the right wrist to achieve patent hemostasis.  The patient left lab in stable condition.  Diagnostic Dominance: Right  Coronary CTA 10/12/21: IMPRESSION: 1. Tricuspid aortic valve with severe diffuse calcifications. There is acquired fusion of the RCC/LCC.   2. Small aortic valve annulus (329 mm2). Would consider a 26 mm Evolut Pro as sinus measurements are appropriate and good sinus heights noted. No significant annular or subannular calcifications.   3. Sufficient coronary to annulus distance.   4. Optimal Fluoroscopic Angle for Delivery: LAO 21 CRA 6  CT abdomen 10/12/21:  IMPRESSION: 1. Vascular findings and measurements pertinent to potential TAVR procedure, as detailed above. 2. Severe thickening calcification of the aortic valve, compatible with reported clinical history of severe aortic stenosis. 3. 2 unusual appearing heterogeneously enhancing lesions in the left kidney, concerning for neoplasm. Further evaluation with nonemergent abdominal MRI with and without IV gadolinium is strongly recommended in the near future to provide more definitive characterization. 4. 3 mm right upper lobe pulmonary nodule, nonspecific, but statistically likely benign. No follow-up needed if patient is low-risk. Non-contrast chest CT can be considered in 12 months if patient is high-risk. This recommendation follows the consensus statement: Guidelines for Management of Incidental Pulmonary Nodules Detected on CT Images: From the Fleischner Society 2017; Radiology 2017; 284:228-243. 5. Trace bilateral pleural effusions (left-greater-than-right). 6. Colonic diverticulosis without evidence of acute diverticulitis at this time.  Echocardiogram 10/10/21:   1. Normal LV function; calcified aortic valve with severe AS (mean  gradient 40 mmHg; DI 0.21); mac with mild MS (mean gradient 5 mmHg) and  mild MR.   2. Left ventricular ejection fraction, by  estimation, is 60 to 65%. The  left ventricle has normal function. The left ventricle has no regional  wall motion abnormalities. There is mild left ventricular hypertrophy of  the basal-septal segment. Left  ventricular diastolic parameters are consistent with Grade I diastolic  dysfunction (impaired relaxation). Elevated left atrial pressure. The  average left ventricular global longitudinal strain is -18.4 %. The global  longitudinal strain is normal.   3. Right ventricular systolic function is normal. The right ventricular  size is normal.   4. The mitral valve is normal in structure. Mild mitral valve  regurgitation. Mild mitral stenosis. Moderate mitral annular  calcification.   5. The aortic valve is calcified. Aortic valve regurgitation is mild.  Severe aortic valve stenosis.   6. The inferior vena cava is normal in size with greater than 50%  respiratory variability, suggesting right atrial pressure of 3 mmHg.   Laboratory Data:  High Sensitivity Troponin:   Recent Labs  Lab 10/09/21 1837 10/09/21 2209 10/10/21 0605 10/10/21 1015 10/11/21 0022  TROPONINIHS 15 45* 154* 199* 199*     Chemistry Recent Labs  Lab 10/11/21 0022 10/12/21 0928 10/13/21 0312  NA 130* 132* 131*  K 4.1 3.6 3.9  CL 98 96* 96*  CO2 _0 GLUCOSE 215* 268* 160*  BUN 30* 28* 27*  CREATININE 1.20* 1.18* 1.10*  CALCIUM 8.3* 8.2* 8.3*  GFRNONAA 48*  49* 54*  ANIONGAP _0 Recent Labs  Lab 10/12/21 0928  PROT 6.1*  ALBUMIN 2.5*  AST 38  ALT 31  ALKPHOS 76  BILITOT 0.7   Hematology Recent Labs  Lab 10/11/21 0022 10/12/21 0124 10/13/21 0312  WBC 9.2 6.7 7.6  RBC 2.83* 2.80* 2.77*  HGB 8.9* 8.7* 8.7*  HCT 26.5* 26.1* 25.8*  MCV 93.6 93.2 93.1  MCH 31.4 31.1 31.4  MCHC 33.6 33.3 33.7  RDW 13.6 13.6 13.6  PLT 159 145* 171   BNP Recent Labs  Lab 10/09/21 2210  BNP 511.4*    DDimer No results for input(s): DDIMER in the last 168 hours.   Radiology/Studies:   DG Chest 2 View  Result Date: 10/09/2021 CLINICAL DATA:  Chest pain, 1 week of shortness of breath. EXAM: CHEST - 2 VIEW COMPARISON:  Chest CT January 15, 2021 and chest radiograph November 22, 2020. FINDINGS: The heart size and mediastinal contours are within normal limits. Aortic atherosclerosis. Linear opacity in the right apex appears stable likely reflecting post radiation change. No new focal airspace consolidation. No visible pleural effusion or pneumothorax. No acute osseous abnormality. Cholecystectomy clips. IMPRESSION: 1. No active cardiopulmonary disease. 2. Linear opacity in the right apex appears stable likely reflecting post treatment scarring. Electronically Signed   By: Dahlia Bailiff M.D.   On: 10/09/2021 19:10   CARDIAC CATHETERIZATION  Result Date: 10/10/2021 Images from the original result were not included.   Ost RCA lesion is 75% stenosed.   RPAV lesion is 90% stenosed.   Ost LAD to Prox LAD lesion is 40% stenosed.   Previously placed Prox LAD to Mid LAD stent (unknown type) is  widely patent. Abiageal Blowe Malinak is a 70 y.o. female  [id number] LOCATION:  FACILITY: [facility] PHYSICIAN: Quay Burow, M.D. 1951/01/26 DATE OF PROCEDURE:  10/10/2021 DATE OF DISCHARGE: CARDIAC CATHETERIZATION History obtained from chart review.  70 year old female with a history of hypertension, hyperlipidemia, diabetes and prior history of LAD stenting in 2013.  She does have mild to moderate aortic stenosis.  She has had several weeks of chest pain when lying down.  She had worse pain tonight and came to the emergency room where she had a millimeter of ST segment elevation in lead V1 and ST segment depression in the inferior lateral leads.  She was brought to the Cath Lab for urgent cath and potential intervention.   Ms. Hemmingway  has a widely patent LAD stent and moderate segmental disease in the proximal LAD.  There is no other disease in the left system.  She does have a 75% true ostial RCA with mild damping of a 5  [place] catheter.  I reviewed her angiograms with Dr. De Nurse who agrees that there is no culprit lesion in the LAD territory.  In addition, her LVEDP was only 8.  We Molly treat her medically at this time, cycle her enzymes and heparinize her for the time being.  The radial sheath was removed and a TR band was placed on the right wrist to achieve patent hemostasis.  The patient left lab in stable condition. Quay Burow. MD, [facility] 10/10/2021 1:19 AM    CT CORONARY MORPH W/CTA COR W/SCORE W/CA W/CM &/OR WO/CM  Addendum Date: 10/12/2021   ADDENDUM REPORT: 10/12/2021 21:44 CLINICAL DATA:  Severe Aortic Stenosis. EXAM: Cardiac TAVR CT TECHNIQUE: A non-contrast, gated CT scan was obtained with axial slices of 3 mm through the heart for aortic valve calcium scoring. A  90 kV retrospective, gated, contrast cardiac scan was obtained. Gantry rotation speed was 250 msecs and collimation was 0.6 mm. Nitroglycerin was not given. The 3D data set was reconstructed in 5% intervals of the 0-95% of the R-R cycle. Systolic and diastolic phases were analyzed on a dedicated workstation using MPR, MIP, and VRT modes. The patient received 100 cc of contrast. FINDINGS: Image quality: Excellent. Noise artifact is: Limited. Valve Morphology: The aortic valve is tricuspid with severe diffuse calcifications. There is acquired fusion of the RCC/LCC. Aortic Valve Calcium score: 798 Aortic annular dimension: Phase assessed: 30% Annular area: 329 mm2 Annular perimeter: 66.0 mm Max diameter: 23.3 mm Min diameter: 18.8 mm Membranous septum length: 8.4 mm Annular and subannular calcification: Trace, layered single calcification under the [place]. Optimal coplanar projection: LAO 21 CRA 6 Coronary Artery Height above Annulus: Left Main: 13.5 mm Right Coronary: 16.7 mm Sinus of Valsalva Measurements: Non-coronary: 28.3 mm Right-coronary: 27.0 mm Left-coronary: 28.6 mm Sinus of Valsalva Height: Non-coronary: 17.6 mm Right-coronary: 21.2 mm  Left-coronary: 19.9 mm Sinotubular Junction: 24.1 mm.  Mild calcifications. Ascending Thoracic Aorta: 25.9 mm.  Mild calcifications. Coronary Arteries: Normal coronary origin. Right dominance. The study was performed without use of NTG and is insufficient for plaque evaluation. Please refer to recent cardiac catheterization for coronary assessment. Severe 3-vessel coronary calcifications noted. Cardiac Morphology: Right Atrium: Right atrial size is within normal limits. Right Ventricle: The right ventricular cavity is within normal limits. Left Atrium: Left atrial size is dilated with no left atrial appendage filling defect. Small PFO present. Left Ventricle: The ventricular cavity size is within normal limits. There are no stigmata of prior infarction. There is no abnormal filling defect. Normal left ventricular function, EF=71%. No regional wall motion abnormalities. Pulmonary arteries: Normal in size without proximal filling defect. Pulmonary veins: Normal pulmonary venous drainage. Pericardium: Normal thickness with no significant effusion or calcium present. Mitral Valve: The mitral valve is degenerative with moderate to severe mitral annular calcification. Extra-cardiac findings: See attached radiology report for non-cardiac structures. IMPRESSION: 1. Tricuspid aortic valve with severe diffuse calcifications. There is acquired fusion of the RCC/LCC. 2. Small aortic valve annulus (329 mm2). Would consider a 26 mm Evolut Pro as sinus measurements are appropriate and good sinus heights noted. No significant annular or subannular calcifications. 3. Sufficient coronary to annulus distance. 4. Optimal Fluoroscopic Angle for Delivery: LAO 21 CRA 6 [place] T. Audie Box, MD Electronically Signed   By: Eleonore Chiquito M.D.   On: 10/12/2021 21:44   Result Date: 10/12/2021 EXAM: OVER-READ INTERPRETATION  CT CHEST The following report is an over-read performed by radiologist Dr. Vinnie Langton of [facility], [place] on  10/12/2021. This over-read does not include interpretation of cardiac or coronary anatomy or pathology. The coronary calcium score/coronary CTA interpretation by the cardiologist is attached. COMPARISON:  Chest CT 01/15/2021. FINDINGS: Extracardiac findings Molly be described separately under dictation for contemporaneously obtained CTA chest, abdomen and pelvis. IMPRESSION: Please see separate dictation for contemporaneously obtained CTA chest, abdomen and pelvis dated 10/12/2021 for full description of relevant extracardiac findings. Electronically Signed: By: Vinnie Langton M.D. On: 10/12/2021 09:21   DG CHEST PORT 1 VIEW  Result Date: 10/10/2021 CLINICAL DATA:  Hypoxia EXAM: PORTABLE CHEST 1 VIEW COMPARISON:  10/09/2021, CT chest 01/15/2021, chest x-ray 12/02/2020 FINDINGS: fibrosis at the right apex. No acute consolidation or pleural effusion. Mild diffuse bronchitic changes. Stable cardiomediastinal silhouette with aortic atherosclerosis. IMPRESSION: No active disease. Mild diffuse bronchitic changes with probable fibrosis at right  apex Electronically Signed   By: Donavan Foil M.D.   On: 10/10/2021 23:50   CT ANGIO CHEST AORTA W/CM & OR WO/CM  Result Date: 10/12/2021 CLINICAL DATA:  70 year old female with history of severe aortic stenosis. Preprocedural study prior to potential transcatheter aortic valve replacement (TAVR) procedure. EXAM: CT ANGIOGRAPHY CHEST, ABDOMEN AND PELVIS TECHNIQUE: Multidetector CT imaging through the chest, abdomen and pelvis was performed using the standard protocol during bolus administration of intravenous contrast. Multiplanar reconstructed images and MIPs were obtained and reviewed to evaluate the vascular anatomy. CONTRAST:  155m OMNIPAQUE IOHEXOL 350 MG/ML SOLN COMPARISON:  Chest CT 01/15/2021. CT the abdomen and pelvis 12/30/2009. FINDINGS: CTA CHEST FINDINGS Cardiovascular: Heart size is mildly enlarged. There is no significant pericardial fluid, thickening or  pericardial calcification. There is aortic atherosclerosis, as well as atherosclerosis of the great vessels of the mediastinum and the coronary arteries, including calcified atherosclerotic plaque in the left main, left anterior descending, left circumflex and right coronary arteries. Severe thickening and calcification of the aortic valve. Calcifications of the mitral annulus. Mediastinum/Lymph Nodes: No pathologically enlarged mediastinal or hilar lymph nodes. Esophagus is unremarkable in appearance. No axillary lymphadenopathy. Lungs/Pleura: Chronic area of ground-glass attenuation, septal thickening and regional architectural distortion in the apex of the right upper lobe, similar to the prior study, likely some chronic fibrosis related to prior right axillary radiation therapy. 3 mm right upper lobe pulmonary nodule (axial image 44 of series 5). No acute consolidative airspace disease. Trace bilateral pleural effusions lying dependently (left-greater-than-right). Musculoskeletal/Soft Tissues: There are no aggressive appearing lytic or blastic lesions noted in the visualized portions of the skeleton. CTA ABDOMEN AND PELVIS FINDINGS Hepatobiliary: No suspicious cystic or solid hepatic lesions. No intra or extrahepatic biliary ductal dilatation. Status post cholecystectomy. Pancreas: No pancreatic mass. No pancreatic ductal dilatation. No pancreatic or peripancreatic fluid collections or inflammatory changes. Spleen: Unremarkable. Adrenals/Urinary Tract: There are 2 heterogeneously enhancing lesions in the left kidney which are unusual in appearance, but concerning for potential neoplasm, largest of which is in the lateral aspect of the upper pole of the left kidney (axial image 122 of series 4) measuring 4.0 x 2.3 cm. The smaller lesion is in the posterolateral aspect of the interpolar region of the left kidney (axial image 132 of series 4) measuring 1.8 x 1.1 cm. 1.3 cm low-attenuation lesion in the lateral  aspect of the interpolar region of the right kidney is compatible with a simple cyst. Bilateral adrenal glands are normal in appearance. No hydroureteronephrosis. Urinary bladder is nearly decompressed, but otherwise unremarkable in appearance. Stomach/Bowel: The appearance of the stomach is normal. There is no pathologic dilatation of small bowel or colon. Numerous colonic diverticulae are noted, without surrounding inflammatory changes to suggest an acute diverticulitis at this time. Normal appendix. Vascular/Lymphatic: Aortic atherosclerosis, without evidence of aneurysm or dissection in the abdominal or pelvic vasculature. Vascular findings and measurements pertinent to potential TAVR procedure, as detailed below. No lymphadenopathy noted in the abdomen or pelvis. Reproductive: Status post hysterectomy.  Ovaries are atrophic. Other: No significant volume of ascites.  No pneumoperitoneum. Musculoskeletal: There are no aggressive appearing lytic or blastic lesions noted in the visualized portions of the skeleton. VASCULAR MEASUREMENTS PERTINENT TO TAVR: AORTA: Minimal Aortic Diameter-7 x 8 mm Severity of Aortic Calcification-severe RIGHT PELVIS: Right Common Iliac Artery - Minimal Diameter-7.4 x 8.5 mm Tortuosity-mild Calcification-mild Right External Iliac Artery - Minimal Diameter-6.5 x 6.8 mm Tortuosity-mild Calcification-minimal Right Common Femoral Artery - Minimal Diameter-6.7 x 5.8 mm  Tortuosity-mild Calcification-mild LEFT PELVIS: Left Common Iliac Artery - Minimal Diameter-6.6 x 7.3 mm Tortuosity-mild Calcification-mild Left External Iliac Artery - Minimal Diameter-7.3 x 6.5 mm Tortuosity-mild Calcification-minimal Left Common Femoral Artery - Minimal Diameter-6.0 x 6.2 mm Tortuosity-mild Calcification-mild Review of the MIP images confirms the above findings. IMPRESSION: 1. Vascular findings and measurements pertinent to potential TAVR procedure, as detailed above. 2. Severe thickening calcification of  the aortic valve, compatible with reported clinical history of severe aortic stenosis. 3. 2 unusual appearing heterogeneously enhancing lesions in the left kidney, concerning for neoplasm. Further evaluation with nonemergent abdominal MRI with and without IV gadolinium is strongly recommended in the near future to provide more definitive characterization. 4. 3 mm right upper lobe pulmonary nodule, nonspecific, but statistically likely benign. No follow-up needed if patient is low-risk. Non-contrast chest CT can be considered in 12 months if patient is high-risk. This recommendation follows the consensus statement: Guidelines for Management of Incidental Pulmonary Nodules Detected on CT Images: From the Fleischner Society 2017; Radiology 2017; 284:228-243. 5. Trace bilateral pleural effusions (left-greater-than-right). 6. Colonic diverticulosis without evidence of acute diverticulitis at this time. Electronically Signed   By: Vinnie Langton M.D.   On: 10/12/2021 13:04   VAS US DOPPLER PRE CABG  Result Date: 10/11/2021 PREOPERATIVE VASCULAR EVALUATION Patient Name:  Molly Hill  Date of Exam:   10/11/2021 Medical Rec #: [id number]       Accession #:    [id number] Date of Birth: 03/30/1951      Patient Gender: F Patient Age:   75 years Exam Location:  [facility] Procedure:      VAS US DOPPLER PRE CABG Referring Phys: Gilford Raid --------------------------------------------------------------------------------  Indications:      Pre-CABG. Risk Factors:     Hypertension, hyperlipidemia, Diabetes. Comparison Study: carotid duplex done 07/25/21 Performing Technologist: Archie Patten RVS  Examination Guidelines: A complete evaluation includes B-mode imaging, spectral Doppler, color Doppler, and power Doppler as needed of all accessible portions of each vessel. Bilateral testing is considered an integral part of a complete examination. Limited examinations for reoccurring indications may be performed as  noted.  ABI Findings: +---------+------------------+-----+---------+--------+  Right     Rt Pressure (mmHg) Index Waveform  Comment   +---------+------------------+-----+---------+--------+  Brachial  130                      triphasic           +---------+------------------+-----+---------+--------+  PTA       212                1.63  triphasic           +---------+------------------+-----+---------+--------+  DP        255                1.96  triphasic           +---------+------------------+-----+---------+--------+  Great Toe 104                0.80  Normal              +---------+------------------+-----+---------+--------+ +---------+------------------+-----+---------+-------+  Left      Lt Pressure (mmHg) Index Waveform  Comment  +---------+------------------+-----+---------+-------+  Brachial  118                      triphasic          +---------+------------------+-----+---------+-------+  PTA       255  1.96  triphasic          +---------+------------------+-----+---------+-------+  DP        195                1.50  triphasic          +---------+------------------+-----+---------+-------+  Great Toe 112                0.86  Normal             +---------+------------------+-----+---------+-------+ +-------+---------------+----------------+  ABI/TBI Today's ABI/TBI Previous ABI/TBI  +-------+---------------+----------------+  Right   0.96            0.80              +-------+---------------+----------------+  Left    0.96            0.86              +-------+---------------+----------------+  Right Doppler Findings: +--------+--------+-----+---------+--------+  Site     Pressure Index Doppler   Comments  +--------+--------+-----+---------+--------+  Brachial 130            triphasic           +--------+--------+-----+---------+--------+  Radial                  triphasic           +--------+--------+-----+---------+--------+  Ulnar                   triphasic            +--------+--------+-----+---------+--------+  Left Doppler Findings: +--------+--------+-----+---------+--------+  Site     Pressure Index Doppler   Comments  +--------+--------+-----+---------+--------+  Brachial 118            triphasic           +--------+--------+-----+---------+--------+  Radial                  triphasic           +--------+--------+-----+---------+--------+  Ulnar                   triphasic           +--------+--------+-----+---------+--------+  Right ABI: Resting right ankle-brachial index indicates noncompressible right lower extremity arteries. The right toe-brachial index is normal. Left ABI: Resting left ankle-brachial index indicates noncompressible left lower extremity arteries. The left toe-brachial index is normal. Right Upper Extremity: Doppler waveforms remain within normal limits with right radial compression. Doppler waveforms decrease 50% with right ulnar compression. Left Upper Extremity: Doppler waveforms decrease 50% with left radial compression. Doppler waveforms remain within normal limits with left ulnar compression.  Electronically signed by Harold Barban MD on 10/11/2021 at 6:43:09 PM.    Final    ECHOCARDIOGRAM LIMITED  Result Date: 10/10/2021    ECHOCARDIOGRAM LIMITED REPORT   Patient Name:   Molly Hill Date of Exam: 10/10/2021 Medical Rec #:  [id number]      Height:       61.3 in Accession #:    [id number]     Weight:       155.9 lb Date of Birth:  November 03, 1950     BSA:          1.704 m Patient Age:    1 years       BP:           137/56 mmHg Patient Gender: F              HR:  83 bpm. Exam Location:  Inpatient Procedure: Limited Echo, 3D Echo, Cardiac Doppler, Color Doppler and Strain            Analysis Indications:    CAD Native Vessel I25.10  History:        Patient has prior history of Echocardiogram examinations, most                 recent 11/06/2020. CAD, Carotid Disease; Aortic Valve Disease and                 Mitral Valve Disease. Past  history of breast cancer.  Sonographer:    Darlina Sicilian RDCS Referring Phys: [place]  1. Normal LV function; calcified aortic valve with severe AS (mean gradient 40 mmHg; DI 0.21); mac with mild MS (mean gradient 5 mmHg) and mild MR.  2. Left ventricular ejection fraction, by estimation, is 60 to 65%. The left ventricle has normal function. The left ventricle has no regional wall motion abnormalities. There is mild left ventricular hypertrophy of the basal-septal segment. Left ventricular diastolic parameters are consistent with Grade I diastolic dysfunction (impaired relaxation). Elevated left atrial pressure. The average left ventricular global longitudinal strain is -18.4 %. The global longitudinal strain is normal.  3. Right ventricular systolic function is normal. The right ventricular size is normal.  4. The mitral valve is normal in structure. Mild mitral valve regurgitation. Mild mitral stenosis. Moderate mitral annular calcification.  5. The aortic valve is calcified. Aortic valve regurgitation is mild. Severe aortic valve stenosis.  6. The inferior vena cava is normal in size with greater than 50% respiratory variability, suggesting right atrial pressure of 3 mmHg. FINDINGS  Left Ventricle: Left ventricular ejection fraction, by estimation, is 60 to 65%. The left ventricle has normal function. The left ventricle has no regional wall motion abnormalities. The average left ventricular global longitudinal strain is -18.4 %. The global longitudinal strain is normal. The left ventricular internal cavity size was normal in size. There is mild left ventricular hypertrophy of the basal-septal segment. Left ventricular diastolic parameters are consistent with Grade I diastolic dysfunction (impaired relaxation). Elevated left atrial pressure. Right Ventricle: The right ventricular size is normal. Right ventricular systolic function is normal. Left Atrium: Left atrial size was normal in  size. Right Atrium: Right atrial size was normal in size. Pericardium: There is no evidence of pericardial effusion. Mitral Valve: The mitral valve is normal in structure. Moderate mitral annular calcification. Mild mitral valve regurgitation. Mild mitral valve stenosis. MV peak gradient, 8.2 mmHg. The mean mitral valve gradient is 5.0 mmHg. Tricuspid Valve: The tricuspid valve is normal in structure. Tricuspid valve regurgitation is trivial. No evidence of tricuspid stenosis. Aortic Valve: The aortic valve is calcified. Aortic valve regurgitation is mild. Severe aortic stenosis is present. Aortic valve mean gradient measures 40.0 mmHg. Aortic valve peak gradient measures 60.2 mmHg. Aortic valve area, by VTI measures 0.54 cm. Pulmonic Valve: The pulmonic valve was not well visualized. Pulmonic valve regurgitation is not visualized. No evidence of pulmonic stenosis. Aorta: The aortic root is normal in size and structure. Venous: The inferior vena cava is normal in size with greater than 50% respiratory variability, suggesting right atrial pressure of 3 mmHg. IAS/Shunts: No atrial level shunt detected by color flow Doppler. Additional Comments: Normal LV function; calcified aortic valve with severe AS (mean gradient 40 mmHg; DI 0.21); mac with mild MS (mean gradient 5 mmHg) and mild MR. LEFT VENTRICLE PLAX 2D LVIDd:  4.25 cm   Diastology LVIDs:         3.60 cm   LV e' medial:    4.74 cm/s LV PW:         0.80 cm   LV E/e' medial:  27.3 LV IVS:        1.30 cm   LV e' lateral:   2.95 cm/s LVOT diam:     1.80 cm   LV E/e' lateral: 43.9 LV SV:         52 LV SV Index:   31        2D Longitudinal Strain LVOT Area:     2.54 cm  2D Strain GLS Avg:     -18.4 %                           3D Volume EF:                          3D EF:        60 %                          LV EDV:       116 ml                          LV ESV:       47 ml                          LV SV:        69 ml LEFT ATRIUM         Index LA diam:    4.40  cm 2.58 cm/m  AORTIC VALVE AV Area (Vmax):    0.61 cm AV Area (Vmean):   0.52 cm AV Area (VTI):     0.54 cm AV Vmax:           387.80 cm/s AV Vmean:          303.500 cm/s AV VTI:            0.968 m AV Peak Grad:      60.2 mmHg AV Mean Grad:      40.0 mmHg LVOT Vmax:         92.90 cm/s LVOT Vmean:        61.700 cm/s LVOT VTI:          0.205 m LVOT/AV VTI ratio: 0.21  AORTA Ao Asc diam: 2.50 cm MITRAL VALVE MV Area (PHT): 3.95 cm     SHUNTS MV Area VTI:   1.46 cm     Systemic VTI:  0.20 m MV Peak grad:  8.2 mmHg     Systemic Diam: 1.80 cm MV Mean grad:  5.0 mmHg MV Vmax:       1.43 m/s MV Vmean:      107.0 cm/s MV Decel Time: 192 msec MV E velocity: 129.50 cm/s MV A velocity: 142.00 cm/s MV E/A ratio:  0.91 Kirk Ruths MD Electronically signed by Kirk Ruths MD Signature Date/Time: 10/10/2021/12:33:40 PM    Final    CT Angio Abd/Pel w/ and/or w/o  Result Date: 10/12/2021 CLINICAL DATA:  70 year old female with history of severe aortic stenosis. Preprocedural study prior to potential transcatheter aortic valve replacement (TAVR) procedure. EXAM: CT ANGIOGRAPHY CHEST, ABDOMEN AND PELVIS TECHNIQUE:  Multidetector CT imaging through the chest, abdomen and pelvis was performed using the standard protocol during bolus administration of intravenous contrast. Multiplanar reconstructed images and MIPs were obtained and reviewed to evaluate the vascular anatomy. CONTRAST:  140m OMNIPAQUE IOHEXOL 350 MG/ML SOLN COMPARISON:  Chest CT 01/15/2021. CT the abdomen and pelvis 12/30/2009. FINDINGS: CTA CHEST FINDINGS Cardiovascular: Heart size is mildly enlarged. There is no significant pericardial fluid, thickening or pericardial calcification. There is aortic atherosclerosis, as well as atherosclerosis of the great vessels of the mediastinum and the coronary arteries, including calcified atherosclerotic plaque in the left main, left anterior descending, left circumflex and right coronary arteries. Severe thickening  and calcification of the aortic valve. Calcifications of the mitral annulus. Mediastinum/Lymph Nodes: No pathologically enlarged mediastinal or hilar lymph nodes. Esophagus is unremarkable in appearance. No axillary lymphadenopathy. Lungs/Pleura: Chronic area of ground-glass attenuation, septal thickening and regional architectural distortion in the apex of the right upper lobe, similar to the prior study, likely some chronic fibrosis related to prior right axillary radiation therapy. 3 mm right upper lobe pulmonary nodule (axial image 44 of series 5). No acute consolidative airspace disease. Trace bilateral pleural effusions lying dependently (left-greater-than-right). Musculoskeletal/Soft Tissues: There are no aggressive appearing lytic or blastic lesions noted in the visualized portions of the skeleton. CTA ABDOMEN AND PELVIS FINDINGS Hepatobiliary: No suspicious cystic or solid hepatic lesions. No intra or extrahepatic biliary ductal dilatation. Status post cholecystectomy. Pancreas: No pancreatic mass. No pancreatic ductal dilatation. No pancreatic or peripancreatic fluid collections or inflammatory changes. Spleen: Unremarkable. Adrenals/Urinary Tract: There are 2 heterogeneously enhancing lesions in the left kidney which are unusual in appearance, but concerning for potential neoplasm, largest of which is in the lateral aspect of the upper pole of the left kidney (axial image 122 of series 4) measuring 4.0 x 2.3 cm. The smaller lesion is in the posterolateral aspect of the interpolar region of the left kidney (axial image 132 of series 4) measuring 1.8 x 1.1 cm. 1.3 cm low-attenuation lesion in the lateral aspect of the interpolar region of the right kidney is compatible with a simple cyst. Bilateral adrenal glands are normal in appearance. No hydroureteronephrosis. Urinary bladder is nearly decompressed, but otherwise unremarkable in appearance. Stomach/Bowel: The appearance of the stomach is normal. There  is no pathologic dilatation of small bowel or colon. Numerous colonic diverticulae are noted, without surrounding inflammatory changes to suggest an acute diverticulitis at this time. Normal appendix. Vascular/Lymphatic: Aortic atherosclerosis, without evidence of aneurysm or dissection in the abdominal or pelvic vasculature. Vascular findings and measurements pertinent to potential TAVR procedure, as detailed below. No lymphadenopathy noted in the abdomen or pelvis. Reproductive: Status post hysterectomy.  Ovaries are atrophic. Other: No significant volume of ascites.  No pneumoperitoneum. Musculoskeletal: There are no aggressive appearing lytic or blastic lesions noted in the visualized portions of the skeleton. VASCULAR MEASUREMENTS PERTINENT TO TAVR: AORTA: Minimal Aortic Diameter-7 x 8 mm Severity of Aortic Calcification-severe RIGHT PELVIS: Right Common Iliac Artery - Minimal Diameter-7.4 x 8.5 mm Tortuosity-mild Calcification-mild Right External Iliac Artery - Minimal Diameter-6.5 x 6.8 mm Tortuosity-mild Calcification-minimal Right Common Femoral Artery - Minimal Diameter-6.7 x 5.8 mm Tortuosity-mild Calcification-mild LEFT PELVIS: Left Common Iliac Artery - Minimal Diameter-6.6 x 7.3 mm Tortuosity-mild Calcification-mild Left External Iliac Artery - Minimal Diameter-7.3 x 6.5 mm Tortuosity-mild Calcification-minimal Left Common Femoral Artery - Minimal Diameter-6.0 x 6.2 mm Tortuosity-mild Calcification-mild Review of the MIP images confirms the above findings. IMPRESSION: 1. Vascular findings and measurements pertinent to potential TAVR  procedure, as detailed above. 2. Severe thickening calcification of the aortic valve, compatible with reported clinical history of severe aortic stenosis. 3. 2 unusual appearing heterogeneously enhancing lesions in the left kidney, concerning for neoplasm. Further evaluation with nonemergent abdominal MRI with and without IV gadolinium is strongly recommended in the near  future to provide more definitive characterization. 4. 3 mm right upper lobe pulmonary nodule, nonspecific, but statistically likely benign. No follow-up needed if patient is low-risk. Non-contrast chest CT can be considered in 12 months if patient is high-risk. This recommendation follows the consensus statement: Guidelines for Management of Incidental Pulmonary Nodules Detected on CT Images: From the Fleischner Society 2017; Radiology 2017; 284:228-243. 5. Trace bilateral pleural effusions (left-greater-than-right). 6. Colonic diverticulosis without evidence of acute diverticulitis at this time. Electronically Signed   By: Vinnie Langton M.D.   On: 10/12/2021 13:04     STS Risk Calculator: Procedure: AV Replacement   Risk of Mortality: 4.139% Renal Failure: 9.184% Permanent Stroke: 3.652% Prolonged Ventilation: 15.846% DSW Infection: 0.112% Reoperation: 4.747% Morbidity or Mortality: 24.424% Short Length of Stay: 28.842% Long Length of Stay: 12.409%  [place]  [id number] 10/13/2021  1 a. Ability to shower/bathe Not at all limited  1 b. Ability to walk 1 block Moderately limited  1 c. Ability to hurry/jog Other, Did not do  2. Edema feet/ankles/legs Never over the past 2 weeks  3. Limited by fatigue At least once a day  4. Limited by dyspnea At least once a day  5. Sitting up / on 3+ pillows Less than once a week  6. Limited enjoyment of life Moderately limited  7. Rest of life w/ symptoms Mostly dissatisfied  8 a. Participation in hobbies Moderately limited  8 b. Participation in chores Moderately limited  8 c. Visiting family/friends Moderately limited     Assessment and Plan:   Molly Hill is a 70 y.o. female with symptoms of severe, stage D1 aortic stenosis with NYHA Class III symptoms. I have reviewed the patient's recent echocardiogram which is notable for normal LV systolic function and severe aortic stenosis with peak gradient of  60.75mHg and mean transvalvular gradient of 478mg. The patient's dimensionless index is 0.21 and calculated aortic valve area is 0.54 cm.    I have reviewed the natural history of aortic stenosis with the patient. We have discussed the limitations of medical therapy and the poor prognosis associated with symptomatic aortic stenosis. We have reviewed potential treatment options, including palliative medical therapy, conventional surgical aortic valve replacement, and transcatheter aortic valve replacement. We discussed treatment options in the context of this patient's specific comorbid medical conditions.    The patient's predicted risk of mortality with conventional aortic valve replacement is 4.1% primarily based on prior hx of breast cancer s/p chest radiation which could cause significant pericardial adhesions and render the internal mammary arteries unsuitable for use as bypass conduits. Other significant issues include unstable angina with STEMI presentation found to have significant stenosis in the RPAV vessel which Molly require PCI prior to TAVR consideration. Additionally she has acute diastolic CHF which is being treated with IV Lasix. Once she has been optimized from a coronary and volume standpoint, TAVR seems to be a reasonable treatment option for this patient. She has undergone gated cardiac CTA and a CTA of the chest/abdomen/pelvis which showed anatomy suitable for a Medtronic 2666mvolut Pro TAVR valve.   For questions or updates, please contact [facility] consult [email or web address] for contact info  under    Signed, Kathyrn Drown, NP  10/13/2021 2:05 PM  Patient seen, examined. Available data reviewed. Agree with findings, assessment, and plan as outlined by Kathyrn Drown, NP.  The patient is independently interviewed and examined.  She has alert, oriented, in no distress.  HEENT is normal, carotid upstrokes are normal with bilateral bruits, lungs are clear to auscultation  bilaterally, heart is regular rate and rhythm with a 3/6 crescendo decrescendo murmur at the right upper sternal border, diminished A2, abdomen is soft and nontender, extremities have no edema.  The patient has severe, stage D1 aortic stenosis as well as severe obstructive coronary artery disease involving the ostium of the right coronary artery.  In addition, she has moderate proximal LAD calcific diffuse stenosis.  She has developed CCS functional class III symptoms of exertional angina and dyspnea.  With diuresis here in the hospital, she is clinically improved and was actually able to walk 2 laps around the ICU without symptoms last night.  She previously was experiencing symptoms with almost any activity at all.  I have personally reviewed her echo images which are diagnostic of severe aortic stenosis with a mean transvalvular gradient of 40 mmHg and dimensionless index of 0.21.  LVEF is preserved at 60 to 65% and RV function is normal.  The aortic valve is severely calcified and restricted on 2D imaging.  At cardiac catheterization, the peak to peak transaortic gradient is 39 mmHg.  The patient is undergone formal cardiac surgical consultation and she is deemed a poor candidate for conventional heart surgery because of her past history of bilateral breast radiation and subsequent calcification of the aorta.  She would be at high risk of aortic crossclamping and conventional aortic valve replacement.  It is reasonable to consider transcatheter aortic valve replacement and coronary PCI as an alternative treatment.  I would recommend proceeding with PCI first.  She Molly be loaded with clopidogrel.  Would perform stenting of the ostial RCA and FFR of the LAD.  If the LAD is hemodynamically significant, would proceed with PTCA and stenting.  After she recovers from her coronary stent procedure, she appears to be suitable candidate for TAVR.  Her CTA studies are all reviewed and demonstrate suitable anatomy for  transfemoral TAVR access.  She has a relatively small aortic valve annulus and may be best suited for a supra annular Medtronic valve.  We Molly discuss her case at our valve team meeting.  Considering the patient's clinical improvement, it might be reasonable to stop her heparin and observe her for another 24 hours.  If she is not having resting angina and able to ambulate short distances without symptoms, she could be potentially discharged home once her E. coli bacteremia is fully treated.  She would return next week for PCI.  Our team Molly follow her and help determine whether this can be arranged as an outpatient or whether she needs to stay for inpatient PCI over the extended holiday weekend.  The patient's daughters were present for our conversation today.  All of their questions are answered.  I reviewed risks, indications, and alternatives to coronary PCI with the patient and her family.  They understand and agree to proceed.  We Molly start her on clopidogrel with a 300 mg loading dose and then 75 mg daily.  Sherren Mocha, M.D. 10/13/2021 5:23 PM

## 2021-10-13 NOTE — Progress Notes (Signed)
TCTS  Subjective: No chest pain or shortness of breath.  See by Dr. Burt Knack today.  Objective: Vital signs in last 24 hours: Temp:  [98.4 F (36.9 C)-99.3 F (37.4 C)] 99.3 F (37.4 C) (12/29 1931) Pulse Rate:  [65-89] 72 (12/29 1900) Cardiac Rhythm: Normal sinus rhythm (12/29 1600) Resp:  [12-26] 18 (12/29 1900) BP: (104-156)/(43-106) 111/44 (12/29 1900) SpO2:  [93 %-100 %] 99 % (12/29 1931) Weight:  [67 kg] 67 kg (12/29 0500)  Hemodynamic parameters for last 24 hours:    Intake/Output from previous day: 12/28 0701 - 12/29 0700 In: 567.4 [P.O.:240; I.V.:227.4; IV Piggyback:100] Out: 1925 [[id number]] Intake/Output this shift: No intake/output data recorded.  General appearance: alert and cooperative Neurologic: intact Heart: regular rate and rhythm. 3/6 systolic murmur RSB. Lungs: clear to auscultation bilaterally  Lab Results: Recent Labs    10/13/21 0312 10/13/21 1816  WBC 7.6 8.3  HGB 8.7* 10.5*  HCT 25.8* 31.4*  PLT 171 200   BMET:  Recent Labs    10/12/21 0928 10/13/21 0312 10/13/21 1816  NA 132* 131*  --   K 3.6 3.9  --   CL 96* 96*  --   CO2 25 25  --   GLUCOSE 268* 160*  --   BUN 28* 27*  --   CREATININE 1.18* 1.10* 1.17*  CALCIUM 8.2* 8.3*  --     PT/INR: No results for input(s): LABPROT, INR in the last 72 hours. ABG    Component Value Date/Time   PHART 7.448 10/11/2021 0002   HCO3 25.9 10/11/2021 0002   TCO2 27 10/11/2021 0002   O2SAT 93.0 10/11/2021 0002   CBG (last 3)  Recent Labs    10/13/21 1136 10/13/21 1304 10/13/21 1613  GLUCAP 86 67* 86    Assessment/Plan:  Severe aortic stenosis and multivessel CAD I reviewed the CTA's and gated cardiac CTA shows small annulus that is suitable for a 26 mm Medtronic Evolut valve. CTA of the pelvis shows adequate femoral vascular access. She had 2 lesions in left kidney concerning for neoplasm but MRI does not show any focal mass lesions but with restricted diffusion in these areas  suggesting possible pyelonephritis. Maybe this is the source of her bacteremia. Bacteremia needs to be fully treated before TAVR. She will also need PCI performed before TAVR. Plan is to hopefully get her home and do outpt PCI next week. I reviewed the CTA results with her and answered her questions.   LOS: 3 days    Molly Hill 10/13/2021

## 2021-10-13 NOTE — H&P (View-Only) (Signed)
[facility]   MULTIDISCIPLINARY HEART VALVE TEAM  Cardiology Consultation:   Patient ID: Molly Hill MRN: [id number]; DOB: 1951/04/28  Admit date: 10/09/2021 Date of Consult: 10/13/2021  Primary Care Provider: No primary care provider on file. [facility] Cardiologist: Kirk Ruths, MD   Patient Profile:   Molly Hill is a 70 y.o. female with a hx of breast cancer s/p radiation to the right and bilateral mastectomy, hypertension, hyperlipidemia, DM Type I on insulin, remote tobacco abuse, anemia, mild carotid artery stenosis, CAD with prior stenting to the mLAD 2013, and bicuspid aortic valve disease who is being seen today for the evaluation of severe aortic stenosis at the request of Dr. Johney Frame.  History of Present Illness:   Molly Hill is a very functional 70yo F who lives at alone in [place] but who also cares for her elderly 2yo mother. She reports that she has been doing very well up until about mid year. She states that she travels back and forth to her mothers house and will often have many bags to carry back and forth to the car. She noticed that her stamia to carry heavy luggage and groceries began to diminish. She also noticed increased fatigue and some exertional SOB however contributed this to staying very busy and active. She denies LE edema, orthopnea, dizziness or syncope. She began to have chest pain about 2-3 weeks ago. Symptoms were brief in duration therefore she did not seek further evaluation. She was seen by a pulmonologist for her SOB and was prescribed several inhalers which only marginally worked. On Christmas day she was at her daughters house. She was walking to the restroom and found herself unusually short of breath. She therefore drove herself and her mother back to [place], dropped her mother off and called EMS for transport to the ED for evaluation. On arrival, she was found to have unstable angina with subtle ST changes on her EKG in aVR  with inferior depressions. Initial HsT was 45 with a peak at 199. She was placed on IV Heparin and underwent cardiac catheterization 10/11/21 which showed high grade ostial RCA with a 75% stenosis, RPAV with a 90% stenosis, and 40% stenosis of ostial LAD to proximal LAD, and previous stent proximal to mid LAD widely patent. Echo performed 10/11/2021 showed LVEF 60-65%, LV with no region wall abnormalities, mild MR and MS, mild AI and severe AS with a peak gradient of 60.2 mmHg and aortic valve area by VTI 0.54 cm2. Cardiac surgery was consulted for possible CABG/AVR versus PCI/TAVR. Prior chest imaging shows radiation changes in the right apex along with ascending aorta calcifications extending from the root to the arch felt to be secondary to chest radiation. Per Dr. Cyndia Bent, this can cause significant pericardial adhesions and render the internal mammary arteries unsuitable for use as bypass conduits. Given this, she was felt to be higher risk for conventional surgery due to the need to replace both ascending aorta and aortic root. Case was discussed with Dr. Burt Knack for possible PCI followed by TAVR. Unfortunately the patient became febrile and blood cultures were obtained which were positive for E.Coli. ID was consulted and she was placed on IV antibiotics with continued surveillance. She has also been treated for acute CHF with IV Lasix due to increased weight, and mildly elevated LVEDP on cath. On Dr. Vivi Martens evaluation, plan was to move forward with TAVR imaging for further evaluation. CT imaging 10/12/21 showed a small aortic valve annulus (329 mm2) felt  to be suitable for 26 mm Evolut Pro. There was 2 left kidney lesions concerning for neoplasm. Follow up MRI was ordered and performed with pending results.   Past Medical History:  Diagnosis Date   Alopecia    Anemia    Bicuspid aortic valve    mild to moderate AS by echo 2022   Carotid stenosis    1-39% bilateral carotid stenosis by dopplers 07/2021    Convalescence after chemotherapy    Coronary arteriosclerosis in native artery 2013   s/p PCI of LAD   DM (diabetes mellitus), type 1 (HCC)    Estrogen receptor negative    History of breast cancer    Hyperglycemia    Hyperlipidemia    Hypertension    Mild dehydration    Mitral regurgitation    mild to moderate by echo 2020   Murmur, heart    Swelling of joint of both wrists    Tendinitis of left wrist     Past Surgical History:  Procedure Laterality Date   ANKLE SURGERY Bilateral    CATARACT EXTRACTION, BILATERAL     CHOLECYSTECTOMY     CORONARY STENT INTERVENTION     CORONARY/GRAFT ACUTE MI REVASCULARIZATION N/A 10/10/2021   Procedure: CORONARY/GRAFT ACUTE MI REVASCULARIZATION;  Surgeon: Lorretta Harp, MD;  Location: [facility];  Service: Cardiovascular;  Laterality: N/A;   CTR     ELBOW SURGERY     HAND SURGERY     BOTH THUMBS AND MIDDLE FINGERS   LEFT BREAST MASTECTOMY     MASCULAR  REPAIR     PORT-A-CATH REMOVAL     PORTACATH PLACEMENT     RIGHT MODIFIED MASTECTOMY Right    UPPER LIP SURGERY      Inpatient Medications: Scheduled Meds:  amLODipine  2.5 mg Oral Daily   aspirin  81 mg Oral Daily   atorvastatin  80 mg Oral Daily   B-complex with vitamin C  1 tablet Oral Daily   calcium-vitamin D  1 tablet Oral Q breakfast   Chlorhexidine Gluconate Cloth  6 each Topical Daily   cholecalciferol  1,000 Units Oral Daily   docusate sodium  200 mg Oral Daily   insulin aspart  0-5 Units Subcutaneous QHS   insulin aspart  0-9 Units Subcutaneous TID WC   insulin aspart  2 Units Subcutaneous TID WC   insulin NPH Human  20 Units Subcutaneous BID AC & HS   mouth rinse  15 mL Mouth Rinse BID   metoprolol succinate  25 mg Oral Daily   mometasone-formoterol  2 puff Inhalation BID   polyethylene glycol  17 g Oral BID   sodium chloride flush  3 mL Intravenous Q12H   vitamin B-12  1,000 mcg Oral Daily   Continuous Infusions:  sodium chloride Stopped (10/12/21  0607)    ceFAZolin (ANCEF) IV 2 g (10/13/21 1348)   heparin 950 Units/hr (10/13/21 1100)   PRN Meds: sodium chloride, acetaminophen, bisacodyl, nitroGLYCERIN, ondansetron (ZOFRAN) IV, sodium chloride, sodium chloride flush  Allergies:    Allergies  Allergen Reactions   Codeine     Hyper with vomiting   Onion Diarrhea, Nausea And Vomiting and Other (See Comments)    syncope   Reglan [Metoclopramide]     "pulls muscle to side" "feel paralyzed"    Social History:   Social History   Socioeconomic History   Marital status: Widowed    Spouse name: Not on file   Number of children: Not on file  Years of education: Not on file   Highest education level: Not on file  Occupational History   Not on file  Tobacco Use   Smoking status: Former    Years: 3.00    Types: Cigarettes    Quit date: 08/15/1979    Years since quitting: 42.1   Smokeless tobacco: Never   Tobacco comments:    1 pack would last a week.  Substance and Sexual Activity   Alcohol use: Never   Drug use: Never   Sexual activity: Never  Other Topics Concern   Not on file  Social History Narrative   Not on file   Social Determinants of Health   Financial Resource Strain: Not on file  Food Insecurity: Not on file  Transportation Needs: Not on file  Physical Activity: Not on file  Stress: Not on file  Social Connections: Not on file  Intimate Partner Violence: Not on file    Family History:    Family History  Problem Relation Age of Onset   Heart attack Mother    CAD Father        CABG   Diabetes Father    Stroke Father    Colon cancer Father        19s   Diabetes Brother    Diabetes Paternal Grandfather    Heart attack Paternal Grandfather    Breast cancer Maternal Aunt        60s   ROS:  Please see the history of present illness.   All other ROS reviewed and negative.     Physical Exam/Data:   Vitals:   10/13/21 0911 10/13/21 1000 10/13/21 1100 10/13/21 1137  BP:  (!) 122/53 104/63    Pulse:  81 70   Resp:  18 20   Temp:    98.6 F (37 C)  TempSrc:    Oral  SpO2: 100% 99% 93%   Weight:      Height:        Intake/Output Summary (Last 24 hours) at 10/13/2021 1405 Last data filed at 10/13/2021 1100 Gross per 24 hour  Intake 418.95 ml  Output 1525 ml  Net -1106.05 ml   Last 3 Weights 10/13/2021 10/12/2021 10/10/2021  Weight (lbs) 147 lb 11.3 oz 150 lb 12.7 oz 155 lb 13.8 oz  Weight (kg) 67 kg 68.4 kg 70.7 kg     Body mass index is 27.68 kg/m.   General: Well developed, well nourished, NAD Lungs:Clear to ausculation bilaterally. No wheezes, rales, or rhonchi. Breathing is unlabored. Cardiovascular: RRR with S1 S2. + systolic murmur Abdomen: Soft, non-tender, non-distended. No obvious abdominal masses. Extremities: No edema. Neuro: Alert and oriented. No focal deficits. No facial asymmetry. MAE spontaneously. Psych: Responds to questions appropriately with normal affect.    EKG:  The EKG was personally reviewed and demonstrates:  10/11/21 with HR 86bpm with inferior TW abnormalities and subtle ST elevation in aVR  Telemetry:  Telemetry was personally reviewed and demonstrates:  10/13/21 NSR with rates in the 70-80's   Relevant CV Studies:  Cardiac catheterization 10/10/21:    Ost RCA lesion is 75% stenosed.   RPAV lesion is 90% stenosed.   Ost LAD to Prox LAD lesion is 40% stenosed.   Previously placed Prox LAD to Mid LAD stent (unknown type) is  widely patent.   IMPRESSION: Molly Hill  has a widely patent LAD stent and moderate segmental disease in the proximal LAD.  There is no other disease in the left system.  She does have a 75% true ostial RCA with mild damping of a 5 [place] catheter.  I reviewed her angiograms with Dr. De Nurse who agrees that there is no culprit lesion in the LAD territory.  In addition, her LVEDP was only 8.  We will treat her medically at this time, cycle her enzymes and heparinize her for the time being.  The radial sheath was  removed and a TR band was placed on the right wrist to achieve patent hemostasis.  The patient left lab in stable condition.  Diagnostic Dominance: Right  Coronary CTA 10/12/21: IMPRESSION: 1. Tricuspid aortic valve with severe diffuse calcifications. There is acquired fusion of the RCC/LCC.   2. Small aortic valve annulus (329 mm2). Would consider a 26 mm Evolut Pro as sinus measurements are appropriate and good sinus heights noted. No significant annular or subannular calcifications.   3. Sufficient coronary to annulus distance.   4. Optimal Fluoroscopic Angle for Delivery: LAO 21 CRA 6  CT abdomen 10/12/21:  IMPRESSION: 1. Vascular findings and measurements pertinent to potential TAVR procedure, as detailed above. 2. Severe thickening calcification of the aortic valve, compatible with reported clinical history of severe aortic stenosis. 3. 2 unusual appearing heterogeneously enhancing lesions in the left kidney, concerning for neoplasm. Further evaluation with nonemergent abdominal MRI with and without IV gadolinium is strongly recommended in the near future to provide more definitive characterization. 4. 3 mm right upper lobe pulmonary nodule, nonspecific, but statistically likely benign. No follow-up needed if patient is low-risk. Non-contrast chest CT can be considered in 12 months if patient is high-risk. This recommendation follows the consensus statement: Guidelines for Management of Incidental Pulmonary Nodules Detected on CT Images: From the Fleischner Society 2017; Radiology 2017; 284:228-243. 5. Trace bilateral pleural effusions (left-greater-than-right). 6. Colonic diverticulosis without evidence of acute diverticulitis at this time.  Echocardiogram 10/10/21:   1. Normal LV function; calcified aortic valve with severe AS (mean  gradient 40 mmHg; DI 0.21); mac with mild MS (mean gradient 5 mmHg) and  mild MR.   2. Left ventricular ejection fraction, by  estimation, is 60 to 65%. The  left ventricle has normal function. The left ventricle has no regional  wall motion abnormalities. There is mild left ventricular hypertrophy of  the basal-septal segment. Left  ventricular diastolic parameters are consistent with Grade I diastolic  dysfunction (impaired relaxation). Elevated left atrial pressure. The  average left ventricular global longitudinal strain is -18.4 %. The global  longitudinal strain is normal.   3. Right ventricular systolic function is normal. The right ventricular  size is normal.   4. The mitral valve is normal in structure. Mild mitral valve  regurgitation. Mild mitral stenosis. Moderate mitral annular  calcification.   5. The aortic valve is calcified. Aortic valve regurgitation is mild.  Severe aortic valve stenosis.   6. The inferior vena cava is normal in size with greater than 50%  respiratory variability, suggesting right atrial pressure of 3 mmHg.   Laboratory Data:  High Sensitivity Troponin:   Recent Labs  Lab 10/09/21 1837 10/09/21 2209 10/10/21 0605 10/10/21 1015 10/11/21 0022  TROPONINIHS 15 45* 154* 199* 199*     Chemistry Recent Labs  Lab 10/11/21 0022 10/12/21 0928 10/13/21 0312  NA 130* 132* 131*  K 4.1 3.6 3.9  CL 98 96* 96*  CO2 _0 GLUCOSE 215* 268* 160*  BUN 30* 28* 27*  CREATININE 1.20* 1.18* 1.10*  CALCIUM 8.3* 8.2* 8.3*  GFRNONAA 48*  49* 54*  ANIONGAP _0 Recent Labs  Lab 10/12/21 0928  PROT 6.1*  ALBUMIN 2.5*  AST 38  ALT 31  ALKPHOS 76  BILITOT 0.7   Hematology Recent Labs  Lab 10/11/21 0022 10/12/21 0124 10/13/21 0312  WBC 9.2 6.7 7.6  RBC 2.83* 2.80* 2.77*  HGB 8.9* 8.7* 8.7*  HCT 26.5* 26.1* 25.8*  MCV 93.6 93.2 93.1  MCH 31.4 31.1 31.4  MCHC 33.6 33.3 33.7  RDW 13.6 13.6 13.6  PLT 159 145* 171   BNP Recent Labs  Lab 10/09/21 2210  BNP 511.4*    DDimer No results for input(s): DDIMER in the last 168 hours.   Radiology/Studies:   DG Chest 2 View  Result Date: 10/09/2021 CLINICAL DATA:  Chest pain, 1 week of shortness of breath. EXAM: CHEST - 2 VIEW COMPARISON:  Chest CT January 15, 2021 and chest radiograph November 22, 2020. FINDINGS: The heart size and mediastinal contours are within normal limits. Aortic atherosclerosis. Linear opacity in the right apex appears stable likely reflecting post radiation change. No new focal airspace consolidation. No visible pleural effusion or pneumothorax. No acute osseous abnormality. Cholecystectomy clips. IMPRESSION: 1. No active cardiopulmonary disease. 2. Linear opacity in the right apex appears stable likely reflecting post treatment scarring. Electronically Signed   By: Dahlia Bailiff M.D.   On: 10/09/2021 19:10   CARDIAC CATHETERIZATION  Result Date: 10/10/2021 Images from the original result were not included.   Ost RCA lesion is 75% stenosed.   RPAV lesion is 90% stenosed.   Ost LAD to Prox LAD lesion is 40% stenosed.   Previously placed Prox LAD to Mid LAD stent (unknown type) is  widely patent. Molly Hill is a 70 y.o. female  [id number] LOCATION:  FACILITY: [facility] PHYSICIAN: Quay Burow, M.D. 01/14/1951 DATE OF PROCEDURE:  10/10/2021 DATE OF DISCHARGE: CARDIAC CATHETERIZATION History obtained from chart review.  70 year old female with a history of hypertension, hyperlipidemia, diabetes and prior history of LAD stenting in 2013.  She does have mild to moderate aortic stenosis.  She has had several weeks of chest pain when lying down.  She had worse pain tonight and came to the emergency room where she had a millimeter of ST segment elevation in lead V1 and ST segment depression in the inferior lateral leads.  She was brought to the Cath Lab for urgent cath and potential intervention.   Ms. Gastelum  has a widely patent LAD stent and moderate segmental disease in the proximal LAD.  There is no other disease in the left system.  She does have a 75% true ostial RCA with mild damping of a 5  [place] catheter.  I reviewed her angiograms with Dr. De Nurse who agrees that there is no culprit lesion in the LAD territory.  In addition, her LVEDP was only 8.  We will treat her medically at this time, cycle her enzymes and heparinize her for the time being.  The radial sheath was removed and a TR band was placed on the right wrist to achieve patent hemostasis.  The patient left lab in stable condition. Quay Burow. MD, [facility] 10/10/2021 1:19 AM    CT CORONARY MORPH W/CTA COR W/SCORE W/CA W/CM &/OR WO/CM  Addendum Date: 10/12/2021   ADDENDUM REPORT: 10/12/2021 21:44 CLINICAL DATA:  Severe Aortic Stenosis. EXAM: Cardiac TAVR CT TECHNIQUE: A non-contrast, gated CT scan was obtained with axial slices of 3 mm through the heart for aortic valve calcium scoring. A  90 kV retrospective, gated, contrast cardiac scan was obtained. Gantry rotation speed was 250 msecs and collimation was 0.6 mm. Nitroglycerin was not given. The 3D data set was reconstructed in 5% intervals of the 0-95% of the R-R cycle. Systolic and diastolic phases were analyzed on a dedicated workstation using MPR, MIP, and VRT modes. The patient received 100 cc of contrast. FINDINGS: Image quality: Excellent. Noise artifact is: Limited. Valve Morphology: The aortic valve is tricuspid with severe diffuse calcifications. There is acquired fusion of the RCC/LCC. Aortic Valve Calcium score: 798 Aortic annular dimension: Phase assessed: 30% Annular area: 329 mm2 Annular perimeter: 66.0 mm Max diameter: 23.3 mm Min diameter: 18.8 mm Membranous septum length: 8.4 mm Annular and subannular calcification: Trace, layered single calcification under the [place]. Optimal coplanar projection: LAO 21 CRA 6 Coronary Artery Height above Annulus: Left Main: 13.5 mm Right Coronary: 16.7 mm Sinus of Valsalva Measurements: Non-coronary: 28.3 mm Right-coronary: 27.0 mm Left-coronary: 28.6 mm Sinus of Valsalva Height: Non-coronary: 17.6 mm Right-coronary: 21.2 mm  Left-coronary: 19.9 mm Sinotubular Junction: 24.1 mm.  Mild calcifications. Ascending Thoracic Aorta: 25.9 mm.  Mild calcifications. Coronary Arteries: Normal coronary origin. Right dominance. The study was performed without use of NTG and is insufficient for plaque evaluation. Please refer to recent cardiac catheterization for coronary assessment. Severe 3-vessel coronary calcifications noted. Cardiac Morphology: Right Atrium: Right atrial size is within normal limits. Right Ventricle: The right ventricular cavity is within normal limits. Left Atrium: Left atrial size is dilated with no left atrial appendage filling defect. Small PFO present. Left Ventricle: The ventricular cavity size is within normal limits. There are no stigmata of prior infarction. There is no abnormal filling defect. Normal left ventricular function, EF=71%. No regional wall motion abnormalities. Pulmonary arteries: Normal in size without proximal filling defect. Pulmonary veins: Normal pulmonary venous drainage. Pericardium: Normal thickness with no significant effusion or calcium present. Mitral Valve: The mitral valve is degenerative with moderate to severe mitral annular calcification. Extra-cardiac findings: See attached radiology report for non-cardiac structures. IMPRESSION: 1. Tricuspid aortic valve with severe diffuse calcifications. There is acquired fusion of the RCC/LCC. 2. Small aortic valve annulus (329 mm2). Would consider a 26 mm Evolut Pro as sinus measurements are appropriate and good sinus heights noted. No significant annular or subannular calcifications. 3. Sufficient coronary to annulus distance. 4. Optimal Fluoroscopic Angle for Delivery: LAO 21 CRA 6 [place] T. Audie Box, MD Electronically Signed   By: Eleonore Chiquito M.D.   On: 10/12/2021 21:44   Result Date: 10/12/2021 EXAM: OVER-READ INTERPRETATION  CT CHEST The following report is an over-read performed by radiologist Dr. Vinnie Langton of [facility], [place] on  10/12/2021. This over-read does not include interpretation of cardiac or coronary anatomy or pathology. The coronary calcium score/coronary CTA interpretation by the cardiologist is attached. COMPARISON:  Chest CT 01/15/2021. FINDINGS: Extracardiac findings will be described separately under dictation for contemporaneously obtained CTA chest, abdomen and pelvis. IMPRESSION: Please see separate dictation for contemporaneously obtained CTA chest, abdomen and pelvis dated 10/12/2021 for full description of relevant extracardiac findings. Electronically Signed: By: Vinnie Langton M.D. On: 10/12/2021 09:21   DG CHEST PORT 1 VIEW  Result Date: 10/10/2021 CLINICAL DATA:  Hypoxia EXAM: PORTABLE CHEST 1 VIEW COMPARISON:  10/09/2021, CT chest 01/15/2021, chest x-ray 12/02/2020 FINDINGS: fibrosis at the right apex. No acute consolidation or pleural effusion. Mild diffuse bronchitic changes. Stable cardiomediastinal silhouette with aortic atherosclerosis. IMPRESSION: No active disease. Mild diffuse bronchitic changes with probable fibrosis at right  apex Electronically Signed   By: Donavan Foil M.D.   On: 10/10/2021 23:50   CT ANGIO CHEST AORTA W/CM & OR WO/CM  Result Date: 10/12/2021 CLINICAL DATA:  70 year old female with history of severe aortic stenosis. Preprocedural study prior to potential transcatheter aortic valve replacement (TAVR) procedure. EXAM: CT ANGIOGRAPHY CHEST, ABDOMEN AND PELVIS TECHNIQUE: Multidetector CT imaging through the chest, abdomen and pelvis was performed using the standard protocol during bolus administration of intravenous contrast. Multiplanar reconstructed images and MIPs were obtained and reviewed to evaluate the vascular anatomy. CONTRAST:  117m OMNIPAQUE IOHEXOL 350 MG/ML SOLN COMPARISON:  Chest CT 01/15/2021. CT the abdomen and pelvis 12/30/2009. FINDINGS: CTA CHEST FINDINGS Cardiovascular: Heart size is mildly enlarged. There is no significant pericardial fluid, thickening or  pericardial calcification. There is aortic atherosclerosis, as well as atherosclerosis of the great vessels of the mediastinum and the coronary arteries, including calcified atherosclerotic plaque in the left main, left anterior descending, left circumflex and right coronary arteries. Severe thickening and calcification of the aortic valve. Calcifications of the mitral annulus. Mediastinum/Lymph Nodes: No pathologically enlarged mediastinal or hilar lymph nodes. Esophagus is unremarkable in appearance. No axillary lymphadenopathy. Lungs/Pleura: Chronic area of ground-glass attenuation, septal thickening and regional architectural distortion in the apex of the right upper lobe, similar to the prior study, likely some chronic fibrosis related to prior right axillary radiation therapy. 3 mm right upper lobe pulmonary nodule (axial image 44 of series 5). No acute consolidative airspace disease. Trace bilateral pleural effusions lying dependently (left-greater-than-right). Musculoskeletal/Soft Tissues: There are no aggressive appearing lytic or blastic lesions noted in the visualized portions of the skeleton. CTA ABDOMEN AND PELVIS FINDINGS Hepatobiliary: No suspicious cystic or solid hepatic lesions. No intra or extrahepatic biliary ductal dilatation. Status post cholecystectomy. Pancreas: No pancreatic mass. No pancreatic ductal dilatation. No pancreatic or peripancreatic fluid collections or inflammatory changes. Spleen: Unremarkable. Adrenals/Urinary Tract: There are 2 heterogeneously enhancing lesions in the left kidney which are unusual in appearance, but concerning for potential neoplasm, largest of which is in the lateral aspect of the upper pole of the left kidney (axial image 122 of series 4) measuring 4.0 x 2.3 cm. The smaller lesion is in the posterolateral aspect of the interpolar region of the left kidney (axial image 132 of series 4) measuring 1.8 x 1.1 cm. 1.3 cm low-attenuation lesion in the lateral  aspect of the interpolar region of the right kidney is compatible with a simple cyst. Bilateral adrenal glands are normal in appearance. No hydroureteronephrosis. Urinary bladder is nearly decompressed, but otherwise unremarkable in appearance. Stomach/Bowel: The appearance of the stomach is normal. There is no pathologic dilatation of small bowel or colon. Numerous colonic diverticulae are noted, without surrounding inflammatory changes to suggest an acute diverticulitis at this time. Normal appendix. Vascular/Lymphatic: Aortic atherosclerosis, without evidence of aneurysm or dissection in the abdominal or pelvic vasculature. Vascular findings and measurements pertinent to potential TAVR procedure, as detailed below. No lymphadenopathy noted in the abdomen or pelvis. Reproductive: Status post hysterectomy.  Ovaries are atrophic. Other: No significant volume of ascites.  No pneumoperitoneum. Musculoskeletal: There are no aggressive appearing lytic or blastic lesions noted in the visualized portions of the skeleton. VASCULAR MEASUREMENTS PERTINENT TO TAVR: AORTA: Minimal Aortic Diameter-7 x 8 mm Severity of Aortic Calcification-severe RIGHT PELVIS: Right Common Iliac Artery - Minimal Diameter-7.4 x 8.5 mm Tortuosity-mild Calcification-mild Right External Iliac Artery - Minimal Diameter-6.5 x 6.8 mm Tortuosity-mild Calcification-minimal Right Common Femoral Artery - Minimal Diameter-6.7 x 5.8 mm  Tortuosity-mild Calcification-mild LEFT PELVIS: Left Common Iliac Artery - Minimal Diameter-6.6 x 7.3 mm Tortuosity-mild Calcification-mild Left External Iliac Artery - Minimal Diameter-7.3 x 6.5 mm Tortuosity-mild Calcification-minimal Left Common Femoral Artery - Minimal Diameter-6.0 x 6.2 mm Tortuosity-mild Calcification-mild Review of the MIP images confirms the above findings. IMPRESSION: 1. Vascular findings and measurements pertinent to potential TAVR procedure, as detailed above. 2. Severe thickening calcification of  the aortic valve, compatible with reported clinical history of severe aortic stenosis. 3. 2 unusual appearing heterogeneously enhancing lesions in the left kidney, concerning for neoplasm. Further evaluation with nonemergent abdominal MRI with and without IV gadolinium is strongly recommended in the near future to provide more definitive characterization. 4. 3 mm right upper lobe pulmonary nodule, nonspecific, but statistically likely benign. No follow-up needed if patient is low-risk. Non-contrast chest CT can be considered in 12 months if patient is high-risk. This recommendation follows the consensus statement: Guidelines for Management of Incidental Pulmonary Nodules Detected on CT Images: From the Fleischner Society 2017; Radiology 2017; 284:228-243. 5. Trace bilateral pleural effusions (left-greater-than-right). 6. Colonic diverticulosis without evidence of acute diverticulitis at this time. Electronically Signed   By: Vinnie Langton M.D.   On: 10/12/2021 13:04   VAS US DOPPLER PRE CABG  Result Date: 10/11/2021 PREOPERATIVE VASCULAR EVALUATION Patient Name:  Molly Hill  Date of Exam:   10/11/2021 Medical Rec #: [id number]       Accession #:    [id number] Date of Birth: [birth year]      Patient Gender: F Patient Age:   25 years Exam Location:  [facility] Procedure:      VAS US DOPPLER PRE CABG Referring Phys: Gilford Raid --------------------------------------------------------------------------------  Indications:      Pre-CABG. Risk Factors:     Hypertension, hyperlipidemia, Diabetes. Comparison Study: carotid duplex done 07/25/21 Performing Technologist: Archie Patten RVS  Examination Guidelines: A complete evaluation includes B-mode imaging, spectral Doppler, color Doppler, and power Doppler as needed of all accessible portions of each vessel. Bilateral testing is considered an integral part of a complete examination. Limited examinations for reoccurring indications may be performed as  noted.  ABI Findings: +---------+------------------+-----+---------+--------+  Right     Rt Pressure (mmHg) Index Waveform  Comment   +---------+------------------+-----+---------+--------+  Brachial  130                      triphasic           +---------+------------------+-----+---------+--------+  PTA       212                1.63  triphasic           +---------+------------------+-----+---------+--------+  DP        255                1.96  triphasic           +---------+------------------+-----+---------+--------+  Great Toe 104                0.80  Normal              +---------+------------------+-----+---------+--------+ +---------+------------------+-----+---------+-------+  Left      Lt Pressure (mmHg) Index Waveform  Comment  +---------+------------------+-----+---------+-------+  Brachial  118                      triphasic          +---------+------------------+-----+---------+-------+  PTA       255  1.96  triphasic          +---------+------------------+-----+---------+-------+  DP        195                1.50  triphasic          +---------+------------------+-----+---------+-------+  Great Toe 112                0.86  Normal             +---------+------------------+-----+---------+-------+ +-------+---------------+----------------+  ABI/TBI Today's ABI/TBI Previous ABI/TBI  +-------+---------------+----------------+  Right   0.96            0.80              +-------+---------------+----------------+  Left    0.96            0.86              +-------+---------------+----------------+  Right Doppler Findings: +--------+--------+-----+---------+--------+  Site     Pressure Index Doppler   Comments  +--------+--------+-----+---------+--------+  Brachial 130            triphasic           +--------+--------+-----+---------+--------+  Radial                  triphasic           +--------+--------+-----+---------+--------+  Ulnar                   triphasic            +--------+--------+-----+---------+--------+  Left Doppler Findings: +--------+--------+-----+---------+--------+  Site     Pressure Index Doppler   Comments  +--------+--------+-----+---------+--------+  Brachial 118            triphasic           +--------+--------+-----+---------+--------+  Radial                  triphasic           +--------+--------+-----+---------+--------+  Ulnar                   triphasic           +--------+--------+-----+---------+--------+  Right ABI: Resting right ankle-brachial index indicates noncompressible right lower extremity arteries. The right toe-brachial index is normal. Left ABI: Resting left ankle-brachial index indicates noncompressible left lower extremity arteries. The left toe-brachial index is normal. Right Upper Extremity: Doppler waveforms remain within normal limits with right radial compression. Doppler waveforms decrease 50% with right ulnar compression. Left Upper Extremity: Doppler waveforms decrease 50% with left radial compression. Doppler waveforms remain within normal limits with left ulnar compression.  Electronically signed by Harold Barban MD on 10/11/2021 at 6:43:09 PM.    Final    ECHOCARDIOGRAM LIMITED  Result Date: 10/10/2021    ECHOCARDIOGRAM LIMITED REPORT   Patient Name:   Molly Hill Date of Exam: 10/10/2021 Medical Rec #:  [id number]      Height:       61.3 in Accession #:    [id number]     Weight:       155.9 lb Date of Birth:  November 03, 1950     BSA:          1.704 m Patient Age:    1 years       BP:           137/56 mmHg Patient Gender: F              HR:  83 bpm. Exam Location:  Inpatient Procedure: Limited Echo, 3D Echo, Cardiac Doppler, Color Doppler and Strain            Analysis Indications:    CAD Native Vessel I25.10  History:        Patient has prior history of Echocardiogram examinations, most                 recent 11/06/2020. CAD, Carotid Disease; Aortic Valve Disease and                 Mitral Valve Disease. Past  history of breast cancer.  Sonographer:    Darlina Sicilian RDCS Referring Phys: Barney  1. Normal LV function; calcified aortic valve with severe AS (mean gradient 40 mmHg; DI 0.21); mac with mild MS (mean gradient 5 mmHg) and mild MR.  2. Left ventricular ejection fraction, by estimation, is 60 to 65%. The left ventricle has normal function. The left ventricle has no regional wall motion abnormalities. There is mild left ventricular hypertrophy of the basal-septal segment. Left ventricular diastolic parameters are consistent with Grade I diastolic dysfunction (impaired relaxation). Elevated left atrial pressure. The average left ventricular global longitudinal strain is -18.4 %. The global longitudinal strain is normal.  3. Right ventricular systolic function is normal. The right ventricular size is normal.  4. The mitral valve is normal in structure. Mild mitral valve regurgitation. Mild mitral stenosis. Moderate mitral annular calcification.  5. The aortic valve is calcified. Aortic valve regurgitation is mild. Severe aortic valve stenosis.  6. The inferior vena cava is normal in size with greater than 50% respiratory variability, suggesting right atrial pressure of 3 mmHg. FINDINGS  Left Ventricle: Left ventricular ejection fraction, by estimation, is 60 to 65%. The left ventricle has normal function. The left ventricle has no regional wall motion abnormalities. The average left ventricular global longitudinal strain is -18.4 %. The global longitudinal strain is normal. The left ventricular internal cavity size was normal in size. There is mild left ventricular hypertrophy of the basal-septal segment. Left ventricular diastolic parameters are consistent with Grade I diastolic dysfunction (impaired relaxation). Elevated left atrial pressure. Right Ventricle: The right ventricular size is normal. Right ventricular systolic function is normal. Left Atrium: Left atrial size was normal in  size. Right Atrium: Right atrial size was normal in size. Pericardium: There is no evidence of pericardial effusion. Mitral Valve: The mitral valve is normal in structure. Moderate mitral annular calcification. Mild mitral valve regurgitation. Mild mitral valve stenosis. MV peak gradient, 8.2 mmHg. The mean mitral valve gradient is 5.0 mmHg. Tricuspid Valve: The tricuspid valve is normal in structure. Tricuspid valve regurgitation is trivial. No evidence of tricuspid stenosis. Aortic Valve: The aortic valve is calcified. Aortic valve regurgitation is mild. Severe aortic stenosis is present. Aortic valve mean gradient measures 40.0 mmHg. Aortic valve peak gradient measures 60.2 mmHg. Aortic valve area, by VTI measures 0.54 cm. Pulmonic Valve: The pulmonic valve was not well visualized. Pulmonic valve regurgitation is not visualized. No evidence of pulmonic stenosis. Aorta: The aortic root is normal in size and structure. Venous: The inferior vena cava is normal in size with greater than 50% respiratory variability, suggesting right atrial pressure of 3 mmHg. IAS/Shunts: No atrial level shunt detected by color flow Doppler. Additional Comments: Normal LV function; calcified aortic valve with severe AS (mean gradient 40 mmHg; DI 0.21); mac with mild MS (mean gradient 5 mmHg) and mild MR. LEFT VENTRICLE PLAX 2D LVIDd:  4.25 cm   Diastology LVIDs:         3.60 cm   LV e' medial:    4.74 cm/s LV PW:         0.80 cm   LV E/e' medial:  27.3 LV IVS:        1.30 cm   LV e' lateral:   2.95 cm/s LVOT diam:     1.80 cm   LV E/e' lateral: 43.9 LV SV:         52 LV SV Index:   31        2D Longitudinal Strain LVOT Area:     2.54 cm  2D Strain GLS Avg:     -18.4 %                           3D Volume EF:                          3D EF:        60 %                          LV EDV:       116 ml                          LV ESV:       47 ml                          LV SV:        69 ml LEFT ATRIUM         Index LA diam:    4.40  cm 2.58 cm/m  AORTIC VALVE AV Area (Vmax):    0.61 cm AV Area (Vmean):   0.52 cm AV Area (VTI):     0.54 cm AV Vmax:           387.80 cm/s AV Vmean:          303.500 cm/s AV VTI:            0.968 m AV Peak Grad:      60.2 mmHg AV Mean Grad:      40.0 mmHg LVOT Vmax:         92.90 cm/s LVOT Vmean:        61.700 cm/s LVOT VTI:          0.205 m LVOT/AV VTI ratio: 0.21  AORTA Ao Asc diam: 2.50 cm MITRAL VALVE MV Area (PHT): 3.95 cm     SHUNTS MV Area VTI:   1.46 cm     Systemic VTI:  0.20 m MV Peak grad:  8.2 mmHg     Systemic Diam: 1.80 cm MV Mean grad:  5.0 mmHg MV Vmax:       1.43 m/s MV Vmean:      107.0 cm/s MV Decel Time: 192 msec MV E velocity: 129.50 cm/s MV A velocity: 142.00 cm/s MV E/A ratio:  0.91 Kirk Ruths MD Electronically signed by Kirk Ruths MD Signature Date/Time: 10/10/2021/12:33:40 PM    Final    CT Angio Abd/Pel w/ and/or w/o  Result Date: 10/12/2021 CLINICAL DATA:  70 year old female with history of severe aortic stenosis. Preprocedural study prior to potential transcatheter aortic valve replacement (TAVR) procedure. EXAM: CT ANGIOGRAPHY CHEST, ABDOMEN AND PELVIS TECHNIQUE:  Multidetector CT imaging through the chest, abdomen and pelvis was performed using the standard protocol during bolus administration of intravenous contrast. Multiplanar reconstructed images and MIPs were obtained and reviewed to evaluate the vascular anatomy. CONTRAST:  140m OMNIPAQUE IOHEXOL 350 MG/ML SOLN COMPARISON:  Chest CT 01/15/2021. CT the abdomen and pelvis 12/30/2009. FINDINGS: CTA CHEST FINDINGS Cardiovascular: Heart size is mildly enlarged. There is no significant pericardial fluid, thickening or pericardial calcification. There is aortic atherosclerosis, as well as atherosclerosis of the great vessels of the mediastinum and the coronary arteries, including calcified atherosclerotic plaque in the left main, left anterior descending, left circumflex and right coronary arteries. Severe thickening  and calcification of the aortic valve. Calcifications of the mitral annulus. Mediastinum/Lymph Nodes: No pathologically enlarged mediastinal or hilar lymph nodes. Esophagus is unremarkable in appearance. No axillary lymphadenopathy. Lungs/Pleura: Chronic area of ground-glass attenuation, septal thickening and regional architectural distortion in the apex of the right upper lobe, similar to the prior study, likely some chronic fibrosis related to prior right axillary radiation therapy. 3 mm right upper lobe pulmonary nodule (axial image 44 of series 5). No acute consolidative airspace disease. Trace bilateral pleural effusions lying dependently (left-greater-than-right). Musculoskeletal/Soft Tissues: There are no aggressive appearing lytic or blastic lesions noted in the visualized portions of the skeleton. CTA ABDOMEN AND PELVIS FINDINGS Hepatobiliary: No suspicious cystic or solid hepatic lesions. No intra or extrahepatic biliary ductal dilatation. Status post cholecystectomy. Pancreas: No pancreatic mass. No pancreatic ductal dilatation. No pancreatic or peripancreatic fluid collections or inflammatory changes. Spleen: Unremarkable. Adrenals/Urinary Tract: There are 2 heterogeneously enhancing lesions in the left kidney which are unusual in appearance, but concerning for potential neoplasm, largest of which is in the lateral aspect of the upper pole of the left kidney (axial image 122 of series 4) measuring 4.0 x 2.3 cm. The smaller lesion is in the posterolateral aspect of the interpolar region of the left kidney (axial image 132 of series 4) measuring 1.8 x 1.1 cm. 1.3 cm low-attenuation lesion in the lateral aspect of the interpolar region of the right kidney is compatible with a simple cyst. Bilateral adrenal glands are normal in appearance. No hydroureteronephrosis. Urinary bladder is nearly decompressed, but otherwise unremarkable in appearance. Stomach/Bowel: The appearance of the stomach is normal. There  is no pathologic dilatation of small bowel or colon. Numerous colonic diverticulae are noted, without surrounding inflammatory changes to suggest an acute diverticulitis at this time. Normal appendix. Vascular/Lymphatic: Aortic atherosclerosis, without evidence of aneurysm or dissection in the abdominal or pelvic vasculature. Vascular findings and measurements pertinent to potential TAVR procedure, as detailed below. No lymphadenopathy noted in the abdomen or pelvis. Reproductive: Status post hysterectomy.  Ovaries are atrophic. Other: No significant volume of ascites.  No pneumoperitoneum. Musculoskeletal: There are no aggressive appearing lytic or blastic lesions noted in the visualized portions of the skeleton. VASCULAR MEASUREMENTS PERTINENT TO TAVR: AORTA: Minimal Aortic Diameter-7 x 8 mm Severity of Aortic Calcification-severe RIGHT PELVIS: Right Common Iliac Artery - Minimal Diameter-7.4 x 8.5 mm Tortuosity-mild Calcification-mild Right External Iliac Artery - Minimal Diameter-6.5 x 6.8 mm Tortuosity-mild Calcification-minimal Right Common Femoral Artery - Minimal Diameter-6.7 x 5.8 mm Tortuosity-mild Calcification-mild LEFT PELVIS: Left Common Iliac Artery - Minimal Diameter-6.6 x 7.3 mm Tortuosity-mild Calcification-mild Left External Iliac Artery - Minimal Diameter-7.3 x 6.5 mm Tortuosity-mild Calcification-minimal Left Common Femoral Artery - Minimal Diameter-6.0 x 6.2 mm Tortuosity-mild Calcification-mild Review of the MIP images confirms the above findings. IMPRESSION: 1. Vascular findings and measurements pertinent to potential TAVR  procedure, as detailed above. 2. Severe thickening calcification of the aortic valve, compatible with reported clinical history of severe aortic stenosis. 3. 2 unusual appearing heterogeneously enhancing lesions in the left kidney, concerning for neoplasm. Further evaluation with nonemergent abdominal MRI with and without IV gadolinium is strongly recommended in the near  future to provide more definitive characterization. 4. 3 mm right upper lobe pulmonary nodule, nonspecific, but statistically likely benign. No follow-up needed if patient is low-risk. Non-contrast chest CT can be considered in 12 months if patient is high-risk. This recommendation follows the consensus statement: Guidelines for Management of Incidental Pulmonary Nodules Detected on CT Images: From the Fleischner Society 2017; Radiology 2017; 284:228-243. 5. Trace bilateral pleural effusions (left-greater-than-right). 6. Colonic diverticulosis without evidence of acute diverticulitis at this time. Electronically Signed   By: Vinnie Langton M.D.   On: 10/12/2021 13:04     STS Risk Calculator: Procedure: AV Replacement   Risk of Mortality: 4.139% Renal Failure: 9.184% Permanent Stroke: 3.652% Prolonged Ventilation: 15.846% DSW Infection: 0.112% Reoperation: 4.747% Morbidity or Mortality: 24.424% Short Length of Stay: 28.842% Long Length of Stay: 12.409%  [place]  [id number] 10/13/2021  1 a. Ability to shower/bathe Not at all limited  1 b. Ability to walk 1 block Moderately limited  1 c. Ability to hurry/jog Other, Did not do  2. Edema feet/ankles/legs Never over the past 2 weeks  3. Limited by fatigue At least once a day  4. Limited by dyspnea At least once a day  5. Sitting up / on 3+ pillows Less than once a week  6. Limited enjoyment of life Moderately limited  7. Rest of life w/ symptoms Mostly dissatisfied  8 a. Participation in hobbies Moderately limited  8 b. Participation in chores Moderately limited  8 c. Visiting family/friends Moderately limited     Assessment and Plan:   Molly Hill is a 70 y.o. female with symptoms of severe, stage D1 aortic stenosis with NYHA Class III symptoms. I have reviewed the patient's recent echocardiogram which is notable for normal LV systolic function and severe aortic stenosis with peak gradient of  60.47mHg and mean transvalvular gradient of 471mg. The patient's dimensionless index is 0.21 and calculated aortic valve area is 0.54 cm.    I have reviewed the natural history of aortic stenosis with the patient. We have discussed the limitations of medical therapy and the poor prognosis associated with symptomatic aortic stenosis. We have reviewed potential treatment options, including palliative medical therapy, conventional surgical aortic valve replacement, and transcatheter aortic valve replacement. We discussed treatment options in the context of this patient's specific comorbid medical conditions.    The patient's predicted risk of mortality with conventional aortic valve replacement is 4.1% primarily based on prior hx of breast cancer s/p chest radiation which could cause significant pericardial adhesions and render the internal mammary arteries unsuitable for use as bypass conduits. Other significant issues include unstable angina with STEMI presentation found to have significant stenosis in the RPAV vessel which will require PCI prior to TAVR consideration. Additionally she has acute diastolic CHF which is being treated with IV Lasix. Once she has been optimized from a coronary and volume standpoint, TAVR seems to be a reasonable treatment option for this patient. She has undergone gated cardiac CTA and a CTA of the chest/abdomen/pelvis which showed anatomy suitable for a Medtronic 2673mvolut Pro TAVR valve.   For questions or updates, please contact [facility] consult [email or web address] for contact info  under    Signed, Molly Drown, Molly Hill  10/13/2021 2:05 PM  Patient seen, examined. Available data reviewed. Agree with findings, assessment, and plan as outlined by Molly Drown, Molly Hill.  The patient is independently interviewed and examined.  She has alert, oriented, in no distress.  HEENT is normal, carotid upstrokes are normal with bilateral bruits, lungs are clear to auscultation  bilaterally, heart is regular rate and rhythm with a 3/6 crescendo decrescendo murmur at the right upper sternal border, diminished A2, abdomen is soft and nontender, extremities have no edema.  The patient has severe, stage D1 aortic stenosis as well as severe obstructive coronary artery disease involving the ostium of the right coronary artery.  In addition, she has moderate proximal LAD calcific diffuse stenosis.  She has developed CCS functional class III symptoms of exertional angina and dyspnea.  With diuresis here in the hospital, she is clinically improved and was actually able to walk 2 laps around the ICU without symptoms last night.  She previously was experiencing symptoms with almost any activity at all.  I have personally reviewed her echo images which are diagnostic of severe aortic stenosis with a mean transvalvular gradient of 40 mmHg and dimensionless index of 0.21.  LVEF is preserved at 60 to 65% and RV function is normal.  The aortic valve is severely calcified and restricted on 2D imaging.  At cardiac catheterization, the peak to peak transaortic gradient is 39 mmHg.  The patient is undergone formal cardiac surgical consultation and she is deemed a poor candidate for conventional heart surgery because of her past history of bilateral breast radiation and subsequent calcification of the aorta.  She would be at high risk of aortic crossclamping and conventional aortic valve replacement.  It is reasonable to consider transcatheter aortic valve replacement and coronary PCI as an alternative treatment.  I would recommend proceeding with PCI first.  She will be loaded with clopidogrel.  Would perform stenting of the ostial RCA and FFR of the LAD.  If the LAD is hemodynamically significant, would proceed with PTCA and stenting.  After she recovers from her coronary stent procedure, she appears to be suitable candidate for TAVR.  Her CTA studies are all reviewed and demonstrate suitable anatomy for  transfemoral TAVR access.  She has a relatively small aortic valve annulus and may be best suited for a supra annular Medtronic valve.  We will discuss her case at our valve team meeting.  Considering the patient's clinical improvement, it might be reasonable to stop her heparin and observe her for another 24 hours.  If she is not having resting angina and able to ambulate short distances without symptoms, she could be potentially discharged home once her E. coli bacteremia is fully treated.  She would return next week for PCI.  Our team will follow her and help determine whether this can be arranged as an outpatient or whether she needs to stay for inpatient PCI over the extended holiday weekend.  The patient's daughters were present for our conversation today.  All of their questions are answered.  I reviewed risks, indications, and alternatives to coronary PCI with the patient and her family.  They understand and agree to proceed.  We will start her on clopidogrel with a 300 mg loading dose and then 75 mg daily.  Sherren Mocha, M.D. 10/13/2021 5:23 PM

## 2021-10-14 ENCOUNTER — Other Ambulatory Visit (HOSPITAL_COMMUNITY): Payer: Self-pay

## 2021-10-14 ENCOUNTER — Telehealth: Payer: Self-pay | Admitting: Cardiology

## 2021-10-14 ENCOUNTER — Other Ambulatory Visit: Payer: Self-pay

## 2021-10-14 DIAGNOSIS — B9620 Unspecified Escherichia coli [E. coli] as the cause of diseases classified elsewhere: Secondary | ICD-10-CM | POA: Diagnosis not present

## 2021-10-14 DIAGNOSIS — R7881 Bacteremia: Secondary | ICD-10-CM | POA: Diagnosis not present

## 2021-10-14 DIAGNOSIS — I213 ST elevation (STEMI) myocardial infarction of unspecified site: Secondary | ICD-10-CM | POA: Diagnosis not present

## 2021-10-14 DIAGNOSIS — I2102 ST elevation (STEMI) myocardial infarction involving left anterior descending coronary artery: Secondary | ICD-10-CM | POA: Diagnosis not present

## 2021-10-14 DIAGNOSIS — I350 Nonrheumatic aortic (valve) stenosis: Secondary | ICD-10-CM | POA: Diagnosis not present

## 2021-10-14 LAB — GLUCOSE, CAPILLARY
Glucose-Capillary: 220 mg/dL — ABNORMAL HIGH (ref 70–99)
Glucose-Capillary: 265 mg/dL — ABNORMAL HIGH (ref 70–99)
Glucose-Capillary: 249 mg/dL — ABNORMAL HIGH (ref 70–99)

## 2021-10-14 LAB — BASIC METABOLIC PANEL
Anion gap: 12 (ref 5–15)
BUN: 31 mg/dL — ABNORMAL HIGH (ref 8–23)
CO2: 27 mmol/L (ref 22–32)
Calcium: 8.6 mg/dL — ABNORMAL LOW (ref 8.9–10.3)
Chloride: 95 mmol/L — ABNORMAL LOW (ref 98–111)
Creatinine, Ser: 1.25 mg/dL — ABNORMAL HIGH (ref 0.44–1.00)
GFR, Estimated: 46 mL/min — ABNORMAL LOW (ref >60)
Glucose, Bld: 264 mg/dL — ABNORMAL HIGH (ref 70–99)
Potassium: 4.5 mmol/L (ref 3.5–5.1)
Sodium: 134 mmol/L — ABNORMAL LOW (ref 135–145)

## 2021-10-14 LAB — MAGNESIUM: Magnesium: 1.9 mg/dL (ref 1.7–2.4)

## 2021-10-14 MED ORDER — CLOPIDOGREL BISULFATE 75 MG PO TABS
75.0000 mg | ORAL_TABLET | Freq: Every day | ORAL | 11 refills | Status: DC
  Filled 2021-10-14: qty 30, 30d supply, fill #0

## 2021-10-14 MED ORDER — BISACODYL 5 MG PO TBEC
10.0000 mg | DELAYED_RELEASE_TABLET | Freq: Every day | ORAL | 0 refills | Status: AC | PRN
  Filled 2021-10-14: qty 30, 15d supply, fill #0

## 2021-10-14 MED ORDER — NITROGLYCERIN 0.4 MG SL SUBL
0.4000 mg | SUBLINGUAL_TABLET | SUBLINGUAL | 3 refills | Status: DC | PRN
Start: 2021-10-14 — End: 2024-02-18

## 2021-10-14 MED ORDER — CEPHALEXIN 500 MG PO CAPS
500.0000 mg | ORAL_CAPSULE | Freq: Four times a day (QID) | ORAL | Status: DC
  Administered 2021-10-14: 13:00:00 500 mg via ORAL
  Filled 2021-10-14 (×2): qty 1

## 2021-10-14 MED ORDER — FUROSEMIDE 40 MG PO TABS
40.0000 mg | ORAL_TABLET | Freq: Every day | ORAL | 6 refills | Status: DC
  Filled 2021-10-14: qty 30, 30d supply, fill #0

## 2021-10-14 MED ORDER — CEPHALEXIN 500 MG PO CAPS
500.0000 mg | ORAL_CAPSULE | Freq: Four times a day (QID) | ORAL | 0 refills | Status: DC
  Filled 2021-10-14: qty 14, 4d supply, fill #0

## 2021-10-14 MED ORDER — NITROGLYCERIN 0.4 MG SL SUBL
0.4000 mg | SUBLINGUAL_TABLET | SUBLINGUAL | 3 refills | Status: DC | PRN
  Filled 2021-10-14 (×2): qty 25, 1d supply, fill #0

## 2021-10-14 MED ORDER — DOCUSATE SODIUM 100 MG PO CAPS
200.0000 mg | ORAL_CAPSULE | Freq: Every day | ORAL | 0 refills | Status: AC
  Filled 2021-10-14: qty 10, 5d supply, fill #0

## 2021-10-14 MED ORDER — ATORVASTATIN CALCIUM 80 MG PO TABS
80.0000 mg | ORAL_TABLET | Freq: Every day | ORAL | 6 refills | Status: DC
  Filled 2021-10-14: qty 30, 30d supply, fill #0

## 2021-10-14 NOTE — Telephone Encounter (Signed)
°*  STAT* If patient is at the pharmacy, call can be transferred to refill team.   1. Which medications need to be refilled? (please list name of each medication and dose if known)  nitroGLYCERIN (NITROSTAT) 0.4 MG SL tablet  2. Which pharmacy/location (including street and city if local pharmacy) is medication to be sent to? [facility]  3. Do they need a 30 day or 90 day supply?   Standard emergency supply

## 2021-10-14 NOTE — Progress Notes (Addendum)
CARDIAC REHAB PHASE I   PRE:  Rate/Rhythm: 74 SR    BP: sitting 120/68    SaO2: 98 RA  MODE:  Ambulation: 560 ft   POST:  Rate/Rhythm: 103 ST    BP: sitting 150/59     SaO2: 100 RA  Ambulated without SOB, no c/o. VSS. Gave MI book. Pt feels well and understands plan well. Will do more education after stent. N/a for CRPII until PCI/TAVR however gave her brochure for [facility]. [phone]   [place], ACSM 10/14/2021 10:10 AM

## 2021-10-14 NOTE — Progress Notes (Signed)
[facility]  Date of Admission:  10/09/2021      Total days of antibiotics 4  Cefazolin   Ceftriaxone x 2d           ASSESSMENT: Molly Hill is a 70 y.o. female admitted with worsening SOB and chest pain in the setting of anterior STEMI and worsening AS. Planning PCI and TAVR now given high risk for conventional open chest surgery with h/o breast cancer radiation in the past. Noted to have fever shortly after her catheterization with blood cultures growing E coli. MRI of abdomen to further chartactarize unusual left kidney lesions noted on CT revealed no concern for neoplasm and actually appeared more c/w pyelonephritis. Clinically without symptoms for this but with bacteremia with urinary pathogen would continue antibiotics to complete 7-d course. Noted resistance to bactrim and cipro -     PLAN: Switch to Keflex 500 mg QID now and continue through January 2nd to finish 7d   Principal Problem:   E coli bacteremia Active Problems:   Carcinoma of overlapping sites of right breast in female, estrogen receptor negative ([place])   Acute ST elevation myocardial infarction (STEMI) due to occlusion of distal portion of left anterior descending (LAD) coronary artery (HCC)   STEMI (ST elevation myocardial infarction) (HCC)   Sepsis due to Escherichia coli (E. coli) (HCC)    amLODipine  2.5 mg Oral Daily   aspirin  81 mg Oral Daily   atorvastatin  80 mg Oral Daily   B-complex with vitamin C  1 tablet Oral Daily   calcium-vitamin D  1 tablet Oral Q breakfast   Chlorhexidine Gluconate Cloth  6 each Topical Daily   cholecalciferol  1,000 Units Oral Daily   clopidogrel  75 mg Oral Daily   docusate sodium  200 mg Oral Daily   enoxaparin (LOVENOX) injection  40 mg Subcutaneous Q24H   furosemide  40 mg Oral Daily   insulin aspart  0-5 Units Subcutaneous QHS   insulin aspart  0-9 Units Subcutaneous TID WC   insulin aspart  2 Units Subcutaneous TID WC    insulin NPH Human  10 Units Subcutaneous BID AC & HS   mouth rinse  15 mL Mouth Rinse BID   metoprolol succinate  25 mg Oral Daily   mometasone-formoterol  2 puff Inhalation BID   polyethylene glycol  17 g Oral BID   sodium chloride flush  3 mL Intravenous Q12H   vitamin B-12  1,000 mcg Oral Daily    SUBJECTIVE: No new complaints. Off oxygen and sitting in recliner.    Review of Systems: Review of Systems  Constitutional:  Negative for chills and fever.  HENT:  Negative for tinnitus.   Eyes:  Negative for blurred vision and photophobia.  Respiratory:  Negative for cough, sputum production and shortness of breath.   Cardiovascular:  Negative for chest pain.  Gastrointestinal:  Negative for diarrhea, nausea and vomiting.  Genitourinary:  Negative for dysuria.  Skin:  Negative for Jent.  Neurological:  Negative for headaches.     Allergies  Allergen Reactions   Codeine     Hyper with vomiting   Onion Diarrhea, Nausea And Vomiting and Other (See Comments)    syncope   Reglan [Metoclopramide]     "pulls muscle to side" "feel paralyzed"    OBJECTIVE: Vitals:   10/14/21 0600 10/14/21 0700 10/14/21 0820 10/14/21 0845  BP: (!) 116/51 (!) 121/53  Marland Kitchen)  121/56  Pulse: 73 74  99  Resp: 15 17  15   Temp:   98.7 F (37.1 C)   TempSrc:   Oral   SpO2: 92% 94%  95%  Weight:      Height:       Body mass index is 27.14 kg/m.  Physical Exam Vitals reviewed.  Constitutional:      Appearance: She is well-developed.     Comments: Sitting up in recliner in no distress  HENT:     Mouth/Throat:     Mouth: No oral lesions.     Dentition: Normal dentition. No dental abscesses.     Pharynx: No oropharyngeal exudate.  Cardiovascular:     Rate and Rhythm: Normal rate and regular rhythm.     Heart sounds: Murmur heard.  Pulmonary:     Effort: Pulmonary effort is normal. No tachypnea or accessory muscle usage.     Breath sounds: Normal breath sounds.  Abdominal:     General:  There is no distension.     Palpations: Abdomen is soft.     Tenderness: There is no abdominal tenderness.  Lymphadenopathy:     Cervical: No cervical adenopathy.  Skin:    General: Skin is warm and dry.     Findings: No Deviney.  Neurological:     Mental Status: She is alert and oriented to person, place, and time.  Psychiatric:        Judgment: Judgment normal.    Lab Results Lab Results  Component Value Date   WBC 8.3 10/13/2021   HGB 10.5 (L) 10/13/2021   HCT 31.4 (L) 10/13/2021   MCV 93.5 10/13/2021   PLT 200 10/13/2021    Lab Results  Component Value Date   CREATININE 1.25 (H) 10/14/2021   BUN 31 (H) 10/14/2021   NA 134 (L) 10/14/2021   K 4.5 10/14/2021   CL 95 (L) 10/14/2021   CO2 27 10/14/2021    Lab Results  Component Value Date   ALT 31 10/12/2021   AST 38 10/12/2021   ALKPHOS 76 10/12/2021   BILITOT 0.7 10/12/2021     Microbiology: Recent Results (from the past 240 hour(s))  Resp Panel by RT-PCR (Flu A&B, Covid) Nasopharyngeal Swab     Status: None   Collection Time: 10/09/21 10:54 PM   Specimen: Nasopharyngeal Swab; Nasopharyngeal(NP) swabs in vial transport medium  Result Value Ref Range Status   SARS Coronavirus 2 by RT PCR NEGATIVE NEGATIVE Final    Comment: (NOTE) SARS-CoV-2 target nucleic acids are NOT DETECTED.  The SARS-CoV-2 RNA is generally detectable in upper respiratory specimens during the acute phase of infection. The lowest concentration of SARS-CoV-2 viral copies this assay can detect is 138 copies/mL. A negative result does not preclude SARS-Cov-2 infection and should not be used as the sole basis for treatment or other patient management decisions. A negative result may occur with  improper specimen collection/handling, submission of specimen other than nasopharyngeal swab, presence of viral mutation(s) within the areas targeted by this assay, and inadequate number of viral copies(<138 copies/mL). A negative result must be combined  with clinical observations, patient history, and epidemiological information. The expected result is Negative.  Fact Sheet for Patients:  EntrepreneurPulse.com.au  Fact Sheet for Healthcare Providers:  IncredibleEmployment.be  This test is no t yet approved or cleared by the [place] FDA and  has been authorized for detection and/or diagnosis of SARS-CoV-2 by FDA under an Emergency Use Authorization (EUA). This EUA will remain  in effect (meaning this test can be used) for the duration of the COVID-19 declaration under Section 564(b)(1) of the Act, 21 U.S.C.section 360bbb-3(b)(1), unless the authorization is terminated  or revoked sooner.       Influenza A by PCR NEGATIVE NEGATIVE Final   Influenza B by PCR NEGATIVE NEGATIVE Final    Comment: (NOTE) The Xpert Xpress SARS-CoV-2/FLU/RSV plus assay is intended as an aid in the diagnosis of influenza from Nasopharyngeal swab specimens and should not be used as a sole basis for treatment. Nasal washings and aspirates are unacceptable for Xpert Xpress SARS-CoV-2/FLU/RSV testing.  Fact Sheet for Patients: EntrepreneurPulse.com.au  Fact Sheet for Healthcare Providers: IncredibleEmployment.be  This test is not yet approved or cleared by the [place] FDA and has been authorized for detection and/or diagnosis of SARS-CoV-2 by FDA under an Emergency Use Authorization (EUA). This EUA will remain in effect (meaning this test can be used) for the duration of the COVID-19 declaration under Section 564(b)(1) of the Act, 21 U.S.C. section 360bbb-3(b)(1), unless the authorization is terminated or revoked.  Performed at [facility], [place] [address] [place], [place] [address]   MRSA Next Gen by PCR, Nasal     Status: None   Collection Time: 10/10/21  2:30 AM   Specimen: Nasal Mucosa; Nasal Swab  Result Value Ref Range Status   MRSA by PCR Next Gen NOT  DETECTED NOT DETECTED Final    Comment: (NOTE) The GeneXpert MRSA Assay (FDA approved for NASAL specimens only), is one component of a comprehensive MRSA colonization surveillance program. It is not intended to diagnose MRSA infection nor to guide or monitor treatment for MRSA infections. Test performance is not FDA approved in patients less than 37 years old. Performed at [facility], [place] [address], [place], [place] [address]   Culture, blood (Routine X 2) w Reflex to ID Panel     Status: Abnormal   Collection Time: 10/11/21  1:12 AM   Specimen: BLOOD  Result Value Ref Range Status   Specimen Description BLOOD LEFT ANTECUBITAL  Final   Special Requests   Final    BOTTLES DRAWN AEROBIC AND ANAEROBIC Blood Culture adequate volume   Culture  Setup Time   Final    GRAM NEGATIVE RODS AEROBIC BOTTLE ONLY Organism ID to follow CRITICAL RESULT CALLED TO, READ BACK BY AND VERIFIED WITHDion Body PHARMD [id number] 10/11/21 A BROWNING Performed at [facility], [place] [address] [place], [place] [address]    Culture ESCHERICHIA COLI (A)  Final   Report Status 10/13/2021 FINAL  Final   Organism ID, Bacteria ESCHERICHIA COLI  Final      Susceptibility   Escherichia coli - MIC*    AMPICILLIN 4 SENSITIVE Sensitive     CEFAZOLIN <=4 SENSITIVE Sensitive     CEFEPIME <=0.12 SENSITIVE Sensitive     CEFTAZIDIME <=1 SENSITIVE Sensitive     CEFTRIAXONE <=0.25 SENSITIVE Sensitive     CIPROFLOXACIN >=4 RESISTANT Resistant     GENTAMICIN <=1 SENSITIVE Sensitive     IMIPENEM <=0.25 SENSITIVE Sensitive     TRIMETH/SULFA >=320 RESISTANT Resistant     AMPICILLIN/SULBACTAM <=2 SENSITIVE Sensitive     PIP/TAZO <=4 SENSITIVE Sensitive     * ESCHERICHIA COLI  Blood Culture ID Panel (Reflexed)     Status: Abnormal   Collection Time: 10/11/21  1:12 AM  Result Value Ref Range Status   Enterococcus faecalis NOT DETECTED NOT DETECTED Final   Enterococcus Faecium  NOT DETECTED NOT DETECTED Final    Listeria monocytogenes NOT DETECTED NOT DETECTED Final   Staphylococcus species NOT DETECTED NOT DETECTED Final   Staphylococcus aureus (BCID) NOT DETECTED NOT DETECTED Final   Staphylococcus epidermidis NOT DETECTED NOT DETECTED Final   Staphylococcus lugdunensis NOT DETECTED NOT DETECTED Final   Streptococcus species NOT DETECTED NOT DETECTED Final   Streptococcus agalactiae NOT DETECTED NOT DETECTED Final   Streptococcus pneumoniae NOT DETECTED NOT DETECTED Final   Streptococcus pyogenes NOT DETECTED NOT DETECTED Final   A.calcoaceticus-baumannii NOT DETECTED NOT DETECTED Final   Bacteroides fragilis NOT DETECTED NOT DETECTED Final   Enterobacterales DETECTED (A) NOT DETECTED Final    Comment: Enterobacterales represent a large order of gram negative bacteria, not a single organism. CRITICAL RESULT CALLED TO, READ BACK BY AND VERIFIED WITH: Dion Body PHARMD [id number] 10/11/21 A BROWNING    Enterobacter cloacae complex NOT DETECTED NOT DETECTED Final   Escherichia coli DETECTED (A) NOT DETECTED Final    Comment: CRITICAL RESULT CALLED TO, READ BACK BY AND VERIFIED WITH: Dion Body PHARMD 1834 10/11/21 A BROWNING    Klebsiella aerogenes NOT DETECTED NOT DETECTED Final   Klebsiella oxytoca NOT DETECTED NOT DETECTED Final   Klebsiella pneumoniae NOT DETECTED NOT DETECTED Final   Proteus species NOT DETECTED NOT DETECTED Final   Salmonella species NOT DETECTED NOT DETECTED Final   Serratia marcescens NOT DETECTED NOT DETECTED Final   Haemophilus influenzae NOT DETECTED NOT DETECTED Final   Neisseria meningitidis NOT DETECTED NOT DETECTED Final   Pseudomonas aeruginosa NOT DETECTED NOT DETECTED Final   Stenotrophomonas maltophilia NOT DETECTED NOT DETECTED Final   Candida albicans NOT DETECTED NOT DETECTED Final   Candida auris NOT DETECTED NOT DETECTED Final   Candida glabrata NOT DETECTED NOT DETECTED Final   Candida krusei NOT DETECTED NOT DETECTED Final   Candida parapsilosis NOT DETECTED  NOT DETECTED Final   Candida tropicalis NOT DETECTED NOT DETECTED Final   Cryptococcus neoformans/gattii NOT DETECTED NOT DETECTED Final   CTX-M ESBL NOT DETECTED NOT DETECTED Final   Carbapenem resistance IMP NOT DETECTED NOT DETECTED Final   Carbapenem resistance KPC NOT DETECTED NOT DETECTED Final   Carbapenem resistance NDM NOT DETECTED NOT DETECTED Final   Carbapenem resist OXA 48 LIKE NOT DETECTED NOT DETECTED Final   Carbapenem resistance VIM NOT DETECTED NOT DETECTED Final    Comment: Performed at [facility], [address], [place], [place] [address]  Culture, blood (Routine X 2) w Reflex to ID Panel     Status: Abnormal   Collection Time: 10/11/21  1:27 AM   Specimen: BLOOD RIGHT HAND  Result Value Ref Range Status   Specimen Description BLOOD RIGHT HAND  Final   Special Requests AEROBIC BOTTLE ONLY Blood Culture adequate volume  Final   Culture  Setup Time   Final    GRAM POSITIVE COCCI IN CLUSTERS AEROBIC BOTTLE ONLY CRITICAL RESULT CALLED TO, READ BACK BY AND VERIFIED WITH: PHARMD JESSICA MILLEN 10/11/2021 @2239  BY JW    Culture (A)  Final    STAPHYLOCOCCUS HOMINIS THE SIGNIFICANCE OF ISOLATING THIS ORGANISM FROM A SINGLE SET OF BLOOD CULTURES WHEN MULTIPLE SETS ARE DRAWN IS UNCERTAIN. PLEASE NOTIFY THE MICROBIOLOGY DEPARTMENT WITHIN ONE WEEK IF SPECIATION AND SENSITIVITIES ARE REQUIRED. Performed at [facility], [place] [address] [place], [place] [address]    Report Status 10/12/2021 FINAL  Final  Blood Culture ID Panel (Reflexed)     Status: Abnormal   Collection  Time: 10/11/21  1:27 AM  Result Value Ref Range Status   Enterococcus faecalis NOT DETECTED NOT DETECTED Final   Enterococcus Faecium NOT DETECTED NOT DETECTED Final   Listeria monocytogenes NOT DETECTED NOT DETECTED Final   Staphylococcus species DETECTED (A) NOT DETECTED Final    Comment: CRITICAL RESULT CALLED TO, READ BACK BY AND VERIFIED WITH: PHARMD JESSICA MILLEN 10/11/2021 @2239  BY JW     Staphylococcus aureus (BCID) NOT DETECTED NOT DETECTED Final   Staphylococcus epidermidis NOT DETECTED NOT DETECTED Final   Staphylococcus lugdunensis NOT DETECTED NOT DETECTED Final   Streptococcus species NOT DETECTED NOT DETECTED Final   Streptococcus agalactiae NOT DETECTED NOT DETECTED Final   Streptococcus pneumoniae NOT DETECTED NOT DETECTED Final   Streptococcus pyogenes NOT DETECTED NOT DETECTED Final   A.calcoaceticus-baumannii NOT DETECTED NOT DETECTED Final   Bacteroides fragilis NOT DETECTED NOT DETECTED Final   Enterobacterales NOT DETECTED NOT DETECTED Final   Enterobacter cloacae complex NOT DETECTED NOT DETECTED Final   Escherichia coli NOT DETECTED NOT DETECTED Final   Klebsiella aerogenes NOT DETECTED NOT DETECTED Final   Klebsiella oxytoca NOT DETECTED NOT DETECTED Final   Klebsiella pneumoniae NOT DETECTED NOT DETECTED Final   Proteus species NOT DETECTED NOT DETECTED Final   Salmonella species NOT DETECTED NOT DETECTED Final   Serratia marcescens NOT DETECTED NOT DETECTED Final   Haemophilus influenzae NOT DETECTED NOT DETECTED Final   Neisseria meningitidis NOT DETECTED NOT DETECTED Final   Pseudomonas aeruginosa NOT DETECTED NOT DETECTED Final   Stenotrophomonas maltophilia NOT DETECTED NOT DETECTED Final   Candida albicans NOT DETECTED NOT DETECTED Final   Candida auris NOT DETECTED NOT DETECTED Final   Candida glabrata NOT DETECTED NOT DETECTED Final   Candida krusei NOT DETECTED NOT DETECTED Final   Candida parapsilosis NOT DETECTED NOT DETECTED Final   Candida tropicalis NOT DETECTED NOT DETECTED Final   Cryptococcus neoformans/gattii NOT DETECTED NOT DETECTED Final    Comment: Performed at [facility], [address] [place], [place] [address]    Janene Madeira, MSN, NP-C [place] [facility] Pager: [phone]  @TODAY @ 8:49 AM

## 2021-10-14 NOTE — Discharge Summary (Addendum)
Discharge Summary    Patient ID: Molly Hill MRN: [id number]; DOB: [birth year]  Admit date: 10/09/2021 Discharge date: 10/14/2021  PCP:  No primary care provider on file.   [facility] Providers Cardiologist:  Kirk Ruths, MD  Structural Heart:  Sherren Mocha, MD      Discharge Diagnoses    Principal Problem:   E coli bacteremia Active Problems:   Carcinoma of overlapping sites of right breast in female, estrogen receptor negative ([place])   Acute ST elevation myocardial infarction (STEMI) due to occlusion of distal portion of left anterior descending (LAD) coronary artery (HCC)   STEMI (ST elevation myocardial infarction) ([place])   Sepsis due to Escherichia coli (E. coli) ([place])    Diagnostic Studies/Procedures    TTE 10/10/21: IMPRESSIONS   1. Normal LV function; calcified aortic valve with severe AS (mean  gradient 40 mmHg; DI 0.21); mac with mild MS (mean gradient 5 mmHg) and  mild MR.   2. Left ventricular ejection fraction, by estimation, is 60 to 65%. The  left ventricle has normal function. The left ventricle has no regional  wall motion abnormalities. There is mild left ventricular hypertrophy of  the basal-septal segment. Left  ventricular diastolic parameters are consistent with Grade I diastolic  dysfunction (impaired relaxation). Elevated left atrial pressure. The  average left ventricular global longitudinal strain is -18.4 %. The global  longitudinal strain is normal.   3. Right ventricular systolic function is normal. The right ventricular  size is normal.   4. The mitral valve is normal in structure. Mild mitral valve  regurgitation. Mild mitral stenosis. Moderate mitral annular  calcification.   5. The aortic valve is calcified. Aortic valve regurgitation is mild.  Severe aortic valve stenosis.   6. The inferior vena cava is normal in size with greater than 50%  respiratory variability, suggesting right atrial pressure of 3 mmHg.    Cath  10/10/21:   Ost RCA lesion is 75% stenosed.   RPAV lesion is 90% stenosed.   Ost LAD to Prox LAD lesion is 40% stenosed.   Previously placed Prox LAD to Mid LAD stent (unknown type) is  widely patent. _____________   History of Present Illness     Molly Hill is a 70 y.o. female with PMH w/ stent LAD 2013, mild-mod AS secondary to a bicuspid aortic valve, DM, HTN, HLD, breast CA.   She was admitted  with chest pain and dyspnea.  Electrocardiogram showed diffuse ST depression. Cardiac catheterization revealed 75% ostial right coronary artery, 90% RP AV and 40% ostial to proximal LAD; left ventricular end-diastolic pressure 8 mmHg.   TTE with normal EF 60-65%. severe AS with mean gradient 8mHg, DI 0.21.   Now being worked up for possible PCI/TAVR vs CABG/AVR (high risk surgery candidate due to history of XRT). Course has been complicated by E. Coli bacteremia now on CTX.   Hospital Course     Consultants: Dr. CBurt Knackwith Structural Heart  #NSTEMI: #Multivessel CAD: Patient presented with progressive chest pain consistent with classic angina. Cardiac catheterization 12/26 revealed 75% ostial right coronary artery, 90% RP AV and 40% ostial to proximal LAD; left ventricular end-diastolic pressure 8 mmHg.   Was recommended for medical therapy, however, patient continued to have chest pain with minimal movement and intermittently at rest. TTE with LVEF 60-65% with severe aortic stenosis.   Re-evaluation of cath films with interventional team suggestive that ostial RCA and prox LAD lesions likely hemodynamically significant. Seen by CV surgery  and deemed high risk for CABG/AVR due to history of chest XRT. Now planned for PCI followed by TAVR.  -Plan for PCI next week with Dr. Burt Knack followed by TAVR in the near future once completes course of ABX for bacteremia -Deemed high risk for CABG/AVR due to history of chest XRT -Continue ASA 9m daily -Continue plavix 754mdaily -Continue  lipitor 8019maily -Continue metoprolol 36m20mily -Transitioned to lasix 40mg19mly today   #Severe Aortic Stenosis: Transthoracic echocardiogram 12/26 with EF 60-65%, AVA 0.54, mean gradient 40mmH105mI 0.21.   Determined to be high risk surgical candidate due to history of XRT to the chest for breast cancer.   -Plan for TAVR in near future once treated for E.Coli bacteremia -Deemed high risk for CABG/AVR due to chest XRT   #E.Coli Bacteremia: #Suspected Pyelonephritis on MRI: #Fevers: Patient with fever to 102 with blood culture from 12/27 positive for E.Coli. While she denied any urinary symptoms/flank pain, MRI abdomen suggestive of pyelonephritis.   Initially on CTX now transitioned to cefazolin.  -ID consulted, appreciate recommendations -Transitioned from CTX to cefazolin -Per ID, switch to Keflex 500 mg 4 times daily and continue through January 2   #Acute on Chronic Diastolic Heart Failure Exacerbation: Has required intermittent diuresis over the week. Need to be judicious with diuresis given severe AS as patient preload dependent.  Now transitioned to po lasix. Dry weight ~146lbs. Doing much better and euvolemic on exam. Transitioned to lasix 40mg P24mily  -Continue Lasix 40 mg daily -Continue metoprolol 36mg da36m-Will add spiro/SGLT2i as able post-cath next week -Low Na Diet -Monitor I/Os and daily weights at home   #Acute Hypoxic Respiratory Failure: Improved with diuresis.  -Has been weaned off O2 -Continue p.o. Lasix at home   #Left Renal Enhancing Lesions on CTA: Noted on CT TAVR. However, MRI without evidence of focal mass lesion but rather evidence of possible pyelonephritis. On ABX as above.   #Anemia: Stable -Continue to monitor as outpatient   #HTN: Controlled.  -Continue amlodipine 2.5mg dail51mContinue metoprolol 36mg XL d30m -Continue Lasix 40 mg daily   #HLD: -Continue lipitor 80mg daily41mDMII: -Was on SSI in hospital along with  home Rx -Resume Novolin N insulin 18 units twice daily and Novolin R sliding scale with meals at discharge   #Constipation: Resolved.  -Continue colace -Continue miralax -Bisacodyl prn    Did the patient have an acute coronary syndrome (MI, NSTEMI, STEMI, etc) this admission?:  Yes                               AHA/ACC Clinical Performance & Quality Measures: Aspirin prescribed? - Yes ADP Receptor Inhibitor (Plavix/Clopidogrel, Brilinta/Ticagrelor or Effient/Prasugrel) prescribed (includes medically managed patients)? - Yes Beta Blocker prescribed? - Yes High Intensity Statin (Lipitor 40-80mg or Cre41m 20-40mg) prescr68m? - Yes EF assessed during THIS hospitalization? - Yes For EF <40%, was ACEI/ARB prescribed? - Not Applicable (EF >/= 40%) For EF <85%, Aldosterone Antagonist (Spironolactone or Eplerenone) prescribed? - Not Applicable (EF >/= 40%) Cardiac 88%ab Phase II ordered (including medically managed patients)? - No - needs PCI    Shared Decision Making/Informed Consent The risks [stroke (1 in 1000), death (1 in 1000), kidney failure [usually temporary] (1 in 500), bleeding (1 in 200), allergic reaction [possibly serious] (1 in 200)], benefits (diagnostic support and management of coronary artery disease) and alternatives of a cardiac catheterization were discussed in detail  with Ms. Perrelli and she is willing to proceed.   _____________  Discharge Vitals Blood pressure (!) 121/56, pulse 99, temperature 98.7 F (37.1 C), temperature source Oral, resp. rate 15, height 5' 1.25" (1.556 m), weight 65.7 kg, SpO2 95 %.  Filed Weights   10/12/21 0356 10/13/21 0500 10/14/21 0500  Weight: 68.4 kg 67 kg 65.7 kg    Labs & Radiologic Studies    CBC Recent Labs    10/13/21 0312 10/13/21 1816  WBC 7.6 8.3  HGB 8.7* 10.5*  HCT 25.8* 31.4*  MCV 93.1 93.5  PLT 171 482   Basic Metabolic Panel Recent Labs    10/13/21 0312 10/13/21 1816 10/14/21 0042  NA 131*  --  134*  K 3.9   --  4.5  CL 96*  --  95*  CO2 25  --  27  GLUCOSE 160*  --  264*  BUN 27*  --  31*  CREATININE 1.10* 1.17* 1.25*  CALCIUM 8.3*  --  8.6*  MG 2.0  --  1.9   Liver Function Tests Recent Labs    10/12/21 0928  AST 38  ALT 31  ALKPHOS 76  BILITOT 0.7  PROT 6.1*  ALBUMIN 2.5*   No results for input(s): LIPASE, AMYLASE in the last 72 hours. High Sensitivity Troponin:   Recent Labs  Lab 10/09/21 1837 10/09/21 2209 10/10/21 0605 10/10/21 1015 10/11/21 0022  TROPONINIHS 15 45* 154* 199* 199*    BNP Invalid input(s): POCBNP D-Dimer No results for input(s): DDIMER in the last 72 hours. Hemoglobin A1C Lab Results  Component Value Date   HGBA1C 7.8 (H) 10/09/2021   Fasting Lipid Panel No results for input(s): CHOL, HDL, LDLCALC, TRIG, CHOLHDL, LDLDIRECT in the last 72 hours. Thyroid Function Tests No results for input(s): TSH, T4TOTAL, T3FREE, THYROIDAB in the last 72 hours.  Invalid input(s): FREET3 _____________  DG Chest 2 View  Result Date: 10/09/2021 CLINICAL DATA:  Chest pain, 1 week of shortness of breath. EXAM: CHEST - 2 VIEW COMPARISON:  Chest CT January 15, 2021 and chest radiograph November 22, 2020. FINDINGS: The heart size and mediastinal contours are within normal limits. Aortic atherosclerosis. Linear opacity in the right apex appears stable likely reflecting post radiation change. No new focal airspace consolidation. No visible pleural effusion or pneumothorax. No acute osseous abnormality. Cholecystectomy clips. IMPRESSION: 1. No active cardiopulmonary disease. 2. Linear opacity in the right apex appears stable likely reflecting post treatment scarring. Electronically Signed   By: Dahlia Bailiff M.D.   On: 10/09/2021 19:10   MR ABDOMEN W WO CONTRAST  Result Date: 10/13/2021 CLINICAL DATA:  Indeterminate heterogeneously enhancing left renal masses on CTA. EXAM: MRI ABDOMEN WITHOUT AND WITH CONTRAST TECHNIQUE: Multiplanar multisequence MR imaging of the abdomen  was performed both before and after the administration of intravenous contrast. CONTRAST:  70m GADAVIST GADOBUTROL 1 MMOL/ML IV SOLN COMPARISON:  Abdominal CTA 10/12/2021. Chest CT 01/15/2021 and abdominopelvic CT 12/30/2009. FINDINGS: Lower chest: Mild atelectasis at the lung bases. No significant pleural effusion. Hepatobiliary: The liver is normal in signal without focal abnormality or abnormal enhancement. There is stable mild extrahepatic biliary dilatation status post cholecystectomy. No evidence of choledocholithiasis. Pancreas: Mild atrophy. No evidence of focal mass lesion, ductal dilatation or surrounding inflammation. Spleen: Normal in size without focal abnormality. Adrenals/Urinary Tract: Both adrenal glands appear normal. Corresponding with the areas questioned on recent CT is mild restricted diffusion in the upper pole of the left kidney measuring 2.7 cm on image  91/9 and in the posterior interpolar region, measuring 1.8 cm on image 96/9. However, no abnormal T2 signal or enhancement is seen in these areas. There are small renal cortical cysts bilaterally, largest in the interpolar region of the right kidney measuring 1.1 cm. No evidence of hydronephrosis or perinephric soft tissue stranding. Stomach/Bowel: Mild wall thickening and mucosal hyperenhancement of the distal esophagus. The stomach and visualized small bowel appear unremarkable. There is moderate stool throughout the colon. Vascular/Lymphatic: There are no enlarged abdominal lymph nodes. Aortic and branch vessel atherosclerosis, better seen on CT. No evidence of large vessel occlusion or aneurysm. The portal, superior mesenteric, splenic and renal veins appear normal. Other: No ascites. Musculoskeletal: No acute or significant osseous findings. There is a hemangioma in the T9 vertebral body. IMPRESSION: 1. No focal mass lesion or abnormal enhancement in the left kidney to correspond with the recent findings on CT. There is mild restricted  diffusion in these areas, and focal pyelonephritis is the favored etiology. Small renal cortical cysts are present. Recommend follow-up abdominopelvic CT without and with contrast in 3-6 months. 2. Stable extrahepatic biliary dilatation status post cholecystectomy. 3. Mild wall thickening and mucosal hyperenhancement of the distal esophagus, suggesting reflux-mediated esophagitis. 4.  Aortic Atherosclerosis (ICD10-I70.0). Electronically Signed   By: Richardean Sale M.D.   On: 10/13/2021 18:04   CARDIAC CATHETERIZATION  Result Date: 10/10/2021 Images from the original result were not included.   Ost RCA lesion is 75% stenosed.   RPAV lesion is 90% stenosed.   Ost LAD to Prox LAD lesion is 40% stenosed.   Previously placed Prox LAD to Mid LAD stent (unknown type) is  widely patent. Chala Gul Towles is a 70 y.o. female  [id number] LOCATION:  FACILITY: [facility] PHYSICIAN: Quay Burow, M.D. [birth year] DATE OF PROCEDURE:  10/10/2021 DATE OF DISCHARGE: CARDIAC CATHETERIZATION History obtained from chart review.  70 year old female with a history of hypertension, hyperlipidemia, diabetes and prior history of LAD stenting in 2013.  She does have mild to moderate aortic stenosis.  She has had several weeks of chest pain when lying down.  She had worse pain tonight and came to the emergency room where she had a millimeter of ST segment elevation in lead V1 and ST segment depression in the inferior lateral leads.  She was brought to the Cath Lab for urgent cath and potential intervention.   Ms. Doyon  has a widely patent LAD stent and moderate segmental disease in the proximal LAD.  There is no other disease in the left system.  She does have a 75% true ostial RCA with mild damping of a 5 [place] catheter.  I reviewed her angiograms with Dr. De Nurse who agrees that there is no culprit lesion in the LAD territory.  In addition, her LVEDP was only 8.  We will treat her medically at this time, cycle her enzymes and heparinize her  for the time being.  The radial sheath was removed and a TR band was placed on the right wrist to achieve patent hemostasis.  The patient left lab in stable condition. Quay Burow. MD, [facility] 10/10/2021 1:19 AM    CT CORONARY MORPH W/CTA COR W/SCORE W/CA W/CM &/OR WO/CM  Addendum Date: 10/12/2021   ADDENDUM REPORT: 10/12/2021 21:44 CLINICAL DATA:  Severe Aortic Stenosis. EXAM: Cardiac TAVR CT TECHNIQUE: A non-contrast, gated CT scan was obtained with axial slices of 3 mm through the heart for aortic valve calcium scoring. A 90 kV retrospective, gated, contrast cardiac scan was obtained.  Gantry rotation speed was 250 msecs and collimation was 0.6 mm. Nitroglycerin was not given. The 3D data set was reconstructed in 5% intervals of the 0-95% of the R-R cycle. Systolic and diastolic phases were analyzed on a dedicated workstation using MPR, MIP, and VRT modes. The patient received 100 cc of contrast. FINDINGS: Image quality: Excellent. Noise artifact is: Limited. Valve Morphology: The aortic valve is tricuspid with severe diffuse calcifications. There is acquired fusion of the RCC/LCC. Aortic Valve Calcium score: 798 Aortic annular dimension: Phase assessed: 30% Annular area: 329 mm2 Annular perimeter: 66.0 mm Max diameter: 23.3 mm Min diameter: 18.8 mm Membranous septum length: 8.4 mm Annular and subannular calcification: Trace, layered single calcification under the [place]. Optimal coplanar projection: LAO 21 CRA 6 Coronary Artery Height above Annulus: Left Main: 13.5 mm Right Coronary: 16.7 mm Sinus of Valsalva Measurements: Non-coronary: 28.3 mm Right-coronary: 27.0 mm Left-coronary: 28.6 mm Sinus of Valsalva Height: Non-coronary: 17.6 mm Right-coronary: 21.2 mm Left-coronary: 19.9 mm Sinotubular Junction: 24.1 mm.  Mild calcifications. Ascending Thoracic Aorta: 25.9 mm.  Mild calcifications. Coronary Arteries: Normal coronary origin. Right dominance. The study was performed without use of NTG and is  insufficient for plaque evaluation. Please refer to recent cardiac catheterization for coronary assessment. Severe 3-vessel coronary calcifications noted. Cardiac Morphology: Right Atrium: Right atrial size is within normal limits. Right Ventricle: The right ventricular cavity is within normal limits. Left Atrium: Left atrial size is dilated with no left atrial appendage filling defect. Small PFO present. Left Ventricle: The ventricular cavity size is within normal limits. There are no stigmata of prior infarction. There is no abnormal filling defect. Normal left ventricular function, EF=71%. No regional wall motion abnormalities. Pulmonary arteries: Normal in size without proximal filling defect. Pulmonary veins: Normal pulmonary venous drainage. Pericardium: Normal thickness with no significant effusion or calcium present. Mitral Valve: The mitral valve is degenerative with moderate to severe mitral annular calcification. Extra-cardiac findings: See attached radiology report for non-cardiac structures. IMPRESSION: 1. Tricuspid aortic valve with severe diffuse calcifications. There is acquired fusion of the RCC/LCC. 2. Small aortic valve annulus (329 mm2). Would consider a 26 mm Evolut Pro as sinus measurements are appropriate and good sinus heights noted. No significant annular or subannular calcifications. 3. Sufficient coronary to annulus distance. 4. Optimal Fluoroscopic Angle for Delivery: LAO 21 CRA 6 [place] T. Audie Box, MD Electronically Signed   By: Eleonore Chiquito M.D.   On: 10/12/2021 21:44   Result Date: 10/12/2021 EXAM: OVER-READ INTERPRETATION  CT CHEST The following report is an over-read performed by radiologist Dr. Vinnie Langton of [facility], [place] on 10/12/2021. This over-read does not include interpretation of cardiac or coronary anatomy or pathology. The coronary calcium score/coronary CTA interpretation by the cardiologist is attached. COMPARISON:  Chest CT 01/15/2021. FINDINGS:  Extracardiac findings will be described separately under dictation for contemporaneously obtained CTA chest, abdomen and pelvis. IMPRESSION: Please see separate dictation for contemporaneously obtained CTA chest, abdomen and pelvis dated 10/12/2021 for full description of relevant extracardiac findings. Electronically Signed: By: Vinnie Langton M.D. On: 10/12/2021 09:21   DG CHEST PORT 1 VIEW  Result Date: 10/10/2021 CLINICAL DATA:  Hypoxia EXAM: PORTABLE CHEST 1 VIEW COMPARISON:  10/09/2021, CT chest 01/15/2021, chest x-ray 12/02/2020 FINDINGS: fibrosis at the right apex. No acute consolidation or pleural effusion. Mild diffuse bronchitic changes. Stable cardiomediastinal silhouette with aortic atherosclerosis. IMPRESSION: No active disease. Mild diffuse bronchitic changes with probable fibrosis at right apex Electronically Signed   By: Donavan Foil  M.D.   On: 10/10/2021 23:50   CT ANGIO CHEST AORTA W/CM & OR WO/CM  Result Date: 10/12/2021 CLINICAL DATA:  70 year old female with history of severe aortic stenosis. Preprocedural study prior to potential transcatheter aortic valve replacement (TAVR) procedure. EXAM: CT ANGIOGRAPHY CHEST, ABDOMEN AND PELVIS TECHNIQUE: Multidetector CT imaging through the chest, abdomen and pelvis was performed using the standard protocol during bolus administration of intravenous contrast. Multiplanar reconstructed images and MIPs were obtained and reviewed to evaluate the vascular anatomy. CONTRAST:  182m OMNIPAQUE IOHEXOL 350 MG/ML SOLN COMPARISON:  Chest CT 01/15/2021. CT the abdomen and pelvis 12/30/2009. FINDINGS: CTA CHEST FINDINGS Cardiovascular: Heart size is mildly enlarged. There is no significant pericardial fluid, thickening or pericardial calcification. There is aortic atherosclerosis, as well as atherosclerosis of the great vessels of the mediastinum and the coronary arteries, including calcified atherosclerotic plaque in the left main, left anterior  descending, left circumflex and right coronary arteries. Severe thickening and calcification of the aortic valve. Calcifications of the mitral annulus. Mediastinum/Lymph Nodes: No pathologically enlarged mediastinal or hilar lymph nodes. Esophagus is unremarkable in appearance. No axillary lymphadenopathy. Lungs/Pleura: Chronic area of ground-glass attenuation, septal thickening and regional architectural distortion in the apex of the right upper lobe, similar to the prior study, likely some chronic fibrosis related to prior right axillary radiation therapy. 3 mm right upper lobe pulmonary nodule (axial image 44 of series 5). No acute consolidative airspace disease. Trace bilateral pleural effusions lying dependently (left-greater-than-right). Musculoskeletal/Soft Tissues: There are no aggressive appearing lytic or blastic lesions noted in the visualized portions of the skeleton. CTA ABDOMEN AND PELVIS FINDINGS Hepatobiliary: No suspicious cystic or solid hepatic lesions. No intra or extrahepatic biliary ductal dilatation. Status post cholecystectomy. Pancreas: No pancreatic mass. No pancreatic ductal dilatation. No pancreatic or peripancreatic fluid collections or inflammatory changes. Spleen: Unremarkable. Adrenals/Urinary Tract: There are 2 heterogeneously enhancing lesions in the left kidney which are unusual in appearance, but concerning for potential neoplasm, largest of which is in the lateral aspect of the upper pole of the left kidney (axial image 122 of series 4) measuring 4.0 x 2.3 cm. The smaller lesion is in the posterolateral aspect of the interpolar region of the left kidney (axial image 132 of series 4) measuring 1.8 x 1.1 cm. 1.3 cm low-attenuation lesion in the lateral aspect of the interpolar region of the right kidney is compatible with a simple cyst. Bilateral adrenal glands are normal in appearance. No hydroureteronephrosis. Urinary bladder is nearly decompressed, but otherwise unremarkable in  appearance. Stomach/Bowel: The appearance of the stomach is normal. There is no pathologic dilatation of small bowel or colon. Numerous colonic diverticulae are noted, without surrounding inflammatory changes to suggest an acute diverticulitis at this time. Normal appendix. Vascular/Lymphatic: Aortic atherosclerosis, without evidence of aneurysm or dissection in the abdominal or pelvic vasculature. Vascular findings and measurements pertinent to potential TAVR procedure, as detailed below. No lymphadenopathy noted in the abdomen or pelvis. Reproductive: Status post hysterectomy.  Ovaries are atrophic. Other: No significant volume of ascites.  No pneumoperitoneum. Musculoskeletal: There are no aggressive appearing lytic or blastic lesions noted in the visualized portions of the skeleton. VASCULAR MEASUREMENTS PERTINENT TO TAVR: AORTA: Minimal Aortic Diameter-7 x 8 mm Severity of Aortic Calcification-severe RIGHT PELVIS: Right Common Iliac Artery - Minimal Diameter-7.4 x 8.5 mm Tortuosity-mild Calcification-mild Right External Iliac Artery - Minimal Diameter-6.5 x 6.8 mm Tortuosity-mild Calcification-minimal Right Common Femoral Artery - Minimal Diameter-6.7 x 5.8 mm Tortuosity-mild Calcification-mild LEFT PELVIS: Left Common Iliac Artery -  Minimal Diameter-6.6 x 7.3 mm Tortuosity-mild Calcification-mild Left External Iliac Artery - Minimal Diameter-7.3 x 6.5 mm Tortuosity-mild Calcification-minimal Left Common Femoral Artery - Minimal Diameter-6.0 x 6.2 mm Tortuosity-mild Calcification-mild Review of the MIP images confirms the above findings. IMPRESSION: 1. Vascular findings and measurements pertinent to potential TAVR procedure, as detailed above. 2. Severe thickening calcification of the aortic valve, compatible with reported clinical history of severe aortic stenosis. 3. 2 unusual appearing heterogeneously enhancing lesions in the left kidney, concerning for neoplasm. Further evaluation with nonemergent abdominal  MRI with and without IV gadolinium is strongly recommended in the near future to provide more definitive characterization. 4. 3 mm right upper lobe pulmonary nodule, nonspecific, but statistically likely benign. No follow-up needed if patient is low-risk. Non-contrast chest CT can be considered in 12 months if patient is high-risk. This recommendation follows the consensus statement: Guidelines for Management of Incidental Pulmonary Nodules Detected on CT Images: From the Fleischner Society 2017; Radiology 2017; 284:228-243. 5. Trace bilateral pleural effusions (left-greater-than-right). 6. Colonic diverticulosis without evidence of acute diverticulitis at this time. Electronically Signed   By: Vinnie Langton M.D.   On: 10/12/2021 13:04   VAS US DOPPLER PRE CABG  Result Date: 10/11/2021 PREOPERATIVE VASCULAR EVALUATION Patient Name:  Hindy Perrault Fitterer  Date of Exam:   10/11/2021 Medical Rec #: [id number]       Accession #:    [id number] Date of Birth: 1951/01/28      Patient Gender: F Patient Age:   53 years Exam Location:  [facility] Procedure:      VAS US DOPPLER PRE CABG Referring Phys: Gilford Raid --------------------------------------------------------------------------------  Indications:      Pre-CABG. Risk Factors:     Hypertension, hyperlipidemia, Diabetes. Comparison Study: carotid duplex done 07/25/21 Performing Technologist: Archie Patten RVS  Examination Guidelines: A complete evaluation includes B-mode imaging, spectral Doppler, color Doppler, and power Doppler as needed of all accessible portions of each vessel. Bilateral testing is considered an integral part of a complete examination. Limited examinations for reoccurring indications may be performed as noted.  ABI Findings: +---------+------------------+-----+---------+--------+  Right     Rt Pressure (mmHg) Index Waveform  Comment   +---------+------------------+-----+---------+--------+  Brachial  130                      triphasic            +---------+------------------+-----+---------+--------+  PTA       212                1.63  triphasic           +---------+------------------+-----+---------+--------+  DP        255                1.96  triphasic           +---------+------------------+-----+---------+--------+  Great Toe 104                0.80  Normal              +---------+------------------+-----+---------+--------+ +---------+------------------+-----+---------+-------+  Left      Lt Pressure (mmHg) Index Waveform  Comment  +---------+------------------+-----+---------+-------+  Brachial  118                      triphasic          +---------+------------------+-----+---------+-------+  PTA       255  1.96  triphasic          +---------+------------------+-----+---------+-------+  DP        195                1.50  triphasic          +---------+------------------+-----+---------+-------+  Great Toe 112                0.86  Normal             +---------+------------------+-----+---------+-------+ +-------+---------------+----------------+  ABI/TBI Today's ABI/TBI Previous ABI/TBI  +-------+---------------+----------------+  Right   0.96            0.80              +-------+---------------+----------------+  Left    0.96            0.86              +-------+---------------+----------------+  Right Doppler Findings: +--------+--------+-----+---------+--------+  Site     Pressure Index Doppler   Comments  +--------+--------+-----+---------+--------+  Brachial 130            triphasic           +--------+--------+-----+---------+--------+  Radial                  triphasic           +--------+--------+-----+---------+--------+  Ulnar                   triphasic           +--------+--------+-----+---------+--------+  Left Doppler Findings: +--------+--------+-----+---------+--------+  Site     Pressure Index Doppler   Comments  +--------+--------+-----+---------+--------+  Brachial 118            triphasic            +--------+--------+-----+---------+--------+  Radial                  triphasic           +--------+--------+-----+---------+--------+  Ulnar                   triphasic           +--------+--------+-----+---------+--------+  Right ABI: Resting right ankle-brachial index indicates noncompressible right lower extremity arteries. The right toe-brachial index is normal. Left ABI: Resting left ankle-brachial index indicates noncompressible left lower extremity arteries. The left toe-brachial index is normal. Right Upper Extremity: Doppler waveforms remain within normal limits with right radial compression. Doppler waveforms decrease 50% with right ulnar compression. Left Upper Extremity: Doppler waveforms decrease 50% with left radial compression. Doppler waveforms remain within normal limits with left ulnar compression.  Electronically signed by Harold Barban MD on 10/11/2021 at 6:43:09 PM.    Final    ECHOCARDIOGRAM LIMITED  Result Date: 10/10/2021    ECHOCARDIOGRAM LIMITED REPORT   Patient Name:   KYMORA SCIARA Date of Exam: 10/10/2021 Medical Rec #:  [id number]      Height:       61.3 in Accession #:    [id number]     Weight:       155.9 lb Date of Birth:  [birth year]     BSA:          1.704 m Patient Age:    1 years       BP:           137/56 mmHg Patient Gender: F              HR:  83 bpm. Exam Location:  Inpatient Procedure: Limited Echo, 3D Echo, Cardiac Doppler, Color Doppler and Strain            Analysis Indications:    CAD Native Vessel I25.10  History:        Patient has prior history of Echocardiogram examinations, most                 recent 11/06/2020. CAD, Carotid Disease; Aortic Valve Disease and                 Mitral Valve Disease. Past history of breast cancer.  Sonographer:    Darlina Sicilian RDCS Referring Phys: [place]  1. Normal LV function; calcified aortic valve with severe AS (mean gradient 40 mmHg; DI 0.21); mac with mild MS (mean gradient 5 mmHg) and  mild MR.  2. Left ventricular ejection fraction, by estimation, is 60 to 65%. The left ventricle has normal function. The left ventricle has no regional wall motion abnormalities. There is mild left ventricular hypertrophy of the basal-septal segment. Left ventricular diastolic parameters are consistent with Grade I diastolic dysfunction (impaired relaxation). Elevated left atrial pressure. The average left ventricular global longitudinal strain is -18.4 %. The global longitudinal strain is normal.  3. Right ventricular systolic function is normal. The right ventricular size is normal.  4. The mitral valve is normal in structure. Mild mitral valve regurgitation. Mild mitral stenosis. Moderate mitral annular calcification.  5. The aortic valve is calcified. Aortic valve regurgitation is mild. Severe aortic valve stenosis.  6. The inferior vena cava is normal in size with greater than 50% respiratory variability, suggesting right atrial pressure of 3 mmHg. FINDINGS  Left Ventricle: Left ventricular ejection fraction, by estimation, is 60 to 65%. The left ventricle has normal function. The left ventricle has no regional wall motion abnormalities. The average left ventricular global longitudinal strain is -18.4 %. The global longitudinal strain is normal. The left ventricular internal cavity size was normal in size. There is mild left ventricular hypertrophy of the basal-septal segment. Left ventricular diastolic parameters are consistent with Grade I diastolic dysfunction (impaired relaxation). Elevated left atrial pressure. Right Ventricle: The right ventricular size is normal. Right ventricular systolic function is normal. Left Atrium: Left atrial size was normal in size. Right Atrium: Right atrial size was normal in size. Pericardium: There is no evidence of pericardial effusion. Mitral Valve: The mitral valve is normal in structure. Moderate mitral annular calcification. Mild mitral valve regurgitation. Mild mitral  valve stenosis. MV peak gradient, 8.2 mmHg. The mean mitral valve gradient is 5.0 mmHg. Tricuspid Valve: The tricuspid valve is normal in structure. Tricuspid valve regurgitation is trivial. No evidence of tricuspid stenosis. Aortic Valve: The aortic valve is calcified. Aortic valve regurgitation is mild. Severe aortic stenosis is present. Aortic valve mean gradient measures 40.0 mmHg. Aortic valve peak gradient measures 60.2 mmHg. Aortic valve area, by VTI measures 0.54 cm. Pulmonic Valve: The pulmonic valve was not well visualized. Pulmonic valve regurgitation is not visualized. No evidence of pulmonic stenosis. Aorta: The aortic root is normal in size and structure. Venous: The inferior vena cava is normal in size with greater than 50% respiratory variability, suggesting right atrial pressure of 3 mmHg. IAS/Shunts: No atrial level shunt detected by color flow Doppler. Additional Comments: Normal LV function; calcified aortic valve with severe AS (mean gradient 40 mmHg; DI 0.21); mac with mild MS (mean gradient 5 mmHg) and mild MR. LEFT VENTRICLE PLAX 2D LVIDd:  4.25 cm   Diastology LVIDs:         3.60 cm   LV e' medial:    4.74 cm/s LV PW:         0.80 cm   LV E/e' medial:  27.3 LV IVS:        1.30 cm   LV e' lateral:   2.95 cm/s LVOT diam:     1.80 cm   LV E/e' lateral: 43.9 LV SV:         52 LV SV Index:   31        2D Longitudinal Strain LVOT Area:     2.54 cm  2D Strain GLS Avg:     -18.4 %                           3D Volume EF:                          3D EF:        60 %                          LV EDV:       116 ml                          LV ESV:       47 ml                          LV SV:        69 ml LEFT ATRIUM         Index LA diam:    4.40 cm 2.58 cm/m  AORTIC VALVE AV Area (Vmax):    0.61 cm AV Area (Vmean):   0.52 cm AV Area (VTI):     0.54 cm AV Vmax:           387.80 cm/s AV Vmean:          303.500 cm/s AV VTI:            0.968 m AV Peak Grad:      60.2 mmHg AV Mean Grad:      40.0  mmHg LVOT Vmax:         92.90 cm/s LVOT Vmean:        61.700 cm/s LVOT VTI:          0.205 m LVOT/AV VTI ratio: 0.21  AORTA Ao Asc diam: 2.50 cm MITRAL VALVE MV Area (PHT): 3.95 cm     SHUNTS MV Area VTI:   1.46 cm     Systemic VTI:  0.20 m MV Peak grad:  8.2 mmHg     Systemic Diam: 1.80 cm MV Mean grad:  5.0 mmHg MV Vmax:       1.43 m/s MV Vmean:      107.0 cm/s MV Decel Time: 192 msec MV E velocity: 129.50 cm/s MV A velocity: 142.00 cm/s MV E/A ratio:  0.91 Kirk Ruths MD Electronically signed by Kirk Ruths MD Signature Date/Time: 10/10/2021/12:33:40 PM    Final    CT Angio Abd/Pel w/ and/or w/o  Result Date: 10/12/2021 CLINICAL DATA:  70 year old female with history of severe aortic stenosis. Preprocedural study prior to potential transcatheter aortic valve replacement (TAVR) procedure. EXAM: CT ANGIOGRAPHY CHEST, ABDOMEN AND PELVIS TECHNIQUE:  Multidetector CT imaging through the chest, abdomen and pelvis was performed using the standard protocol during bolus administration of intravenous contrast. Multiplanar reconstructed images and MIPs were obtained and reviewed to evaluate the vascular anatomy. CONTRAST:  135m OMNIPAQUE IOHEXOL 350 MG/ML SOLN COMPARISON:  Chest CT 01/15/2021. CT the abdomen and pelvis 12/30/2009. FINDINGS: CTA CHEST FINDINGS Cardiovascular: Heart size is mildly enlarged. There is no significant pericardial fluid, thickening or pericardial calcification. There is aortic atherosclerosis, as well as atherosclerosis of the great vessels of the mediastinum and the coronary arteries, including calcified atherosclerotic plaque in the left main, left anterior descending, left circumflex and right coronary arteries. Severe thickening and calcification of the aortic valve. Calcifications of the mitral annulus. Mediastinum/Lymph Nodes: No pathologically enlarged mediastinal or hilar lymph nodes. Esophagus is unremarkable in appearance. No axillary lymphadenopathy. Lungs/Pleura: Chronic  area of ground-glass attenuation, septal thickening and regional architectural distortion in the apex of the right upper lobe, similar to the prior study, likely some chronic fibrosis related to prior right axillary radiation therapy. 3 mm right upper lobe pulmonary nodule (axial image 44 of series 5). No acute consolidative airspace disease. Trace bilateral pleural effusions lying dependently (left-greater-than-right). Musculoskeletal/Soft Tissues: There are no aggressive appearing lytic or blastic lesions noted in the visualized portions of the skeleton. CTA ABDOMEN AND PELVIS FINDINGS Hepatobiliary: No suspicious cystic or solid hepatic lesions. No intra or extrahepatic biliary ductal dilatation. Status post cholecystectomy. Pancreas: No pancreatic mass. No pancreatic ductal dilatation. No pancreatic or peripancreatic fluid collections or inflammatory changes. Spleen: Unremarkable. Adrenals/Urinary Tract: There are 2 heterogeneously enhancing lesions in the left kidney which are unusual in appearance, but concerning for potential neoplasm, largest of which is in the lateral aspect of the upper pole of the left kidney (axial image 122 of series 4) measuring 4.0 x 2.3 cm. The smaller lesion is in the posterolateral aspect of the interpolar region of the left kidney (axial image 132 of series 4) measuring 1.8 x 1.1 cm. 1.3 cm low-attenuation lesion in the lateral aspect of the interpolar region of the right kidney is compatible with a simple cyst. Bilateral adrenal glands are normal in appearance. No hydroureteronephrosis. Urinary bladder is nearly decompressed, but otherwise unremarkable in appearance. Stomach/Bowel: The appearance of the stomach is normal. There is no pathologic dilatation of small bowel or colon. Numerous colonic diverticulae are noted, without surrounding inflammatory changes to suggest an acute diverticulitis at this time. Normal appendix. Vascular/Lymphatic: Aortic atherosclerosis, without  evidence of aneurysm or dissection in the abdominal or pelvic vasculature. Vascular findings and measurements pertinent to potential TAVR procedure, as detailed below. No lymphadenopathy noted in the abdomen or pelvis. Reproductive: Status post hysterectomy.  Ovaries are atrophic. Other: No significant volume of ascites.  No pneumoperitoneum. Musculoskeletal: There are no aggressive appearing lytic or blastic lesions noted in the visualized portions of the skeleton. VASCULAR MEASUREMENTS PERTINENT TO TAVR: AORTA: Minimal Aortic Diameter-7 x 8 mm Severity of Aortic Calcification-severe RIGHT PELVIS: Right Common Iliac Artery - Minimal Diameter-7.4 x 8.5 mm Tortuosity-mild Calcification-mild Right External Iliac Artery - Minimal Diameter-6.5 x 6.8 mm Tortuosity-mild Calcification-minimal Right Common Femoral Artery - Minimal Diameter-6.7 x 5.8 mm Tortuosity-mild Calcification-mild LEFT PELVIS: Left Common Iliac Artery - Minimal Diameter-6.6 x 7.3 mm Tortuosity-mild Calcification-mild Left External Iliac Artery - Minimal Diameter-7.3 x 6.5 mm Tortuosity-mild Calcification-minimal Left Common Femoral Artery - Minimal Diameter-6.0 x 6.2 mm Tortuosity-mild Calcification-mild Review of the MIP images confirms the above findings. IMPRESSION: 1. Vascular findings and measurements pertinent to potential TAVR  procedure, as detailed above. 2. Severe thickening calcification of the aortic valve, compatible with reported clinical history of severe aortic stenosis. 3. 2 unusual appearing heterogeneously enhancing lesions in the left kidney, concerning for neoplasm. Further evaluation with nonemergent abdominal MRI with and without IV gadolinium is strongly recommended in the near future to provide more definitive characterization. 4. 3 mm right upper lobe pulmonary nodule, nonspecific, but statistically likely benign. No follow-up needed if patient is low-risk. Non-contrast chest CT can be considered in 12 months if patient is  high-risk. This recommendation follows the consensus statement: Guidelines for Management of Incidental Pulmonary Nodules Detected on CT Images: From the Fleischner Society 2017; Radiology 2017; 284:228-243. 5. Trace bilateral pleural effusions (left-greater-than-right). 6. Colonic diverticulosis without evidence of acute diverticulitis at this time. Electronically Signed   By: Vinnie Langton M.D.   On: 10/12/2021 13:04   Disposition   Pt is being discharged home today in good condition.  Follow-up Plans & Appointments     Discharge Instructions     Diet - low sodium heart healthy   Complete by: As directed    Diet Carb Modified   Complete by: As directed    Increase activity slowly   Complete by: As directed        Discharge Medications   Allergies as of 10/14/2021       Reactions   Codeine    Hyper with vomiting   Onion Diarrhea, Nausea And Vomiting, Other (See Comments)   syncope   Reglan [metoclopramide]    "pulls muscle to side" "feel paralyzed"        Medication List     STOP taking these medications    amLODipine 2.5 MG tablet Commonly known as: NORVASC   celecoxib 200 MG capsule Commonly known as: CELEBREX   Efinaconazole 10 % Soln   losartan 50 MG tablet Commonly known as: COZAAR       TAKE these medications    acetaminophen 500 MG tablet Commonly known as: TYLENOL Take 1,000 mg by mouth every 6 (six) hours as needed for moderate pain or headache.   Advair HFA 230-21 MCG/ACT inhaler Generic drug: fluticasone-salmeterol Inhale 2 puffs into the lungs 2 (two) times daily.   aspirin EC 81 MG tablet Take 81 mg by mouth at bedtime.   atorvastatin 80 MG tablet Commonly known as: LIPITOR Take 1 tablet (80 mg total) by mouth at bedtime. What changed:  medication strength how much to take   bisacodyl 5 MG EC tablet Commonly known as: DULCOLAX Take 2 tablets (10 mg total) by mouth daily as needed for moderate constipation.   CALCIUM 600 +  D PO Take 1 tablet by mouth daily.   cephALEXin 500 MG capsule Commonly known as: KEFLEX Take 1 capsule (500 mg total) by mouth every 6 (six) hours.   cholecalciferol 25 MCG (1000 UNIT) tablet Commonly known as: VITAMIN D Take 1,000 Units by mouth daily.   clopidogrel 75 MG tablet Commonly known as: PLAVIX Take 1 tablet (75 mg total) by mouth daily. Start taking on: October 15, 2021   docusate sodium 100 MG capsule Commonly known as: COLACE Take 2 capsules (200 mg total) by mouth daily. Start taking on: October 15, 2021   fexofenadine 180 MG tablet Commonly known as: ALLEGRA Take 180 mg by mouth daily as needed for allergies.   fluticasone 50 MCG/ACT nasal spray Commonly known as: FLONASE Place into both nostrils as needed.   furosemide 40 MG tablet Commonly known as:  LASIX Take 1 tablet (40 mg total) by mouth daily. Start taking on: October 15, 2021   glucose blood test strip 1 each by Other route 4 (four) times daily -  before meals and at bedtime. Use as instructed   HAIR/SKIN/NAILS PO Take 1 capsule by mouth daily.   insulin NPH Human 100 UNIT/ML injection Commonly known as: NOVOLIN N Inject 18 Units into the skin 2 (two) times daily before a meal.   insulin regular 100 units/mL injection Commonly known as: NOVOLIN R Inject 3-5 Units into the skin See admin instructions. Per sliding scale 3 times daily with meals   loperamide 1 MG/5ML solution Commonly known as: IMODIUM Take by mouth as needed for diarrhea or loose stools.   metoprolol succinate 25 MG 24 hr tablet Commonly known as: Toprol XL Take 1 tablet (25 mg total) by mouth daily. What changed: when to take this   nitroGLYCERIN 0.4 MG SL tablet Commonly known as: NITROSTAT Place 0.4 mg under the tongue every 5 (five) minutes as needed for chest pain.   vitamin B-12 1000 MCG tablet Commonly known as: CYANOCOBALAMIN Take 1,000 mcg by mouth daily.           Outstanding Labs/Studies    None  Duration of Discharge Encounter   Greater than 30 minutes including physician time.  Signed, Rosaria Ferries, PA-C 10/14/2021, 11:10 AM   Patient seen and examined and agree with Rosaria Ferries, PA-C as detailed above. Please see progress note from today for full details.  Will return next week for out-patient PCI and TAVR in the near future once completes course of ABX from E.Coli bacteremia. She will be discharged on lasix 34m PO daily as euvolemic on discharge. Dry weight ~146lbs.  HGwyndolyn Kaufman MD

## 2021-10-14 NOTE — Progress Notes (Signed)
Inpatient Diabetes Program Recommendations  AACE/ADA: New Consensus Statement on Inpatient Glycemic Control (2015)  Target Ranges:  Prepandial:   less than 140 mg/dL      Peak postprandial:   less than 180 mg/dL (1-2 hours)      Critically ill patients:  140 - 180 mg/dL   Lab Results  Component Value Date   GLUCAP 265 (H) 10/14/2021   HGBA1C 7.8 (H) 10/09/2021    Review of Glycemic Control  Latest Reference Range & Units 10/13/21 13:04 10/13/21 16:13 10/13/21 22:03 10/14/21 06:24 10/14/21 08:21  Glucose-Capillary 70 - 99 mg/dL 67 (L) 86 234 (H) 220 (H) 265 (H)  (L): Data is abnormally low (H): Data is abnormally high Diabetes history: DM Outpatient Diabetes medications: NPH 18 units BID, Novolin R 3-5 units TID Current orders for Inpatient glycemic control: NPH 20 units BID, Novolog 0-9 units tid and Novolog 2 units TID with meals   Inpatient Diabetes Program Recommendations:   Since patient is now eating, please consider: -Increase NPH back to 18 units bid  Thank you, [name] [name], RN, MSN, CDE  Diabetes Coordinator Inpatient Glycemic Control Team Team Pager [phone] (8am-5pm) 10/14/2021 9:33 AM

## 2021-10-14 NOTE — Progress Notes (Signed)
Progress Note  Patient Name: Molly Hill Date of Encounter: 10/14/2021  [facility] Cardiologist: Kirk Ruths, MD   Subjective   Patient feels well this morning. Weaned off oxygen. Was able to do a couple of laps around the unit without significant SOB. Hoping to go home soon.  MRI showed no focal mass lesion or abnormal enhancement in the left kidney. Mild restricted diffusion in these areas which may suggest pyelonephritis which could have been source of E. Coli bacteremia  Seen by Dr. Burt Knack. Started on plavix. Plan to hopefully discharge home with plans to bring back for PCI next week. Will need to complete course of ABX prior to TAVR.  Inpatient Medications    Scheduled Meds:  amLODipine  2.5 mg Oral Daily   aspirin  81 mg Oral Daily   atorvastatin  80 mg Oral Daily   B-complex with vitamin C  1 tablet Oral Daily   calcium-vitamin D  1 tablet Oral Q breakfast   Chlorhexidine Gluconate Cloth  6 each Topical Daily   cholecalciferol  1,000 Units Oral Daily   clopidogrel  75 mg Oral Daily   docusate sodium  200 mg Oral Daily   enoxaparin (LOVENOX) injection  40 mg Subcutaneous Q24H   furosemide  40 mg Oral Daily   insulin aspart  0-5 Units Subcutaneous QHS   insulin aspart  0-9 Units Subcutaneous TID WC   insulin aspart  2 Units Subcutaneous TID WC   insulin NPH Human  10 Units Subcutaneous BID AC & HS   mouth rinse  15 mL Mouth Rinse BID   metoprolol succinate  25 mg Oral Daily   mometasone-formoterol  2 puff Inhalation BID   polyethylene glycol  17 g Oral BID   sodium chloride flush  3 mL Intravenous Q12H   vitamin B-12  1,000 mcg Oral Daily   Continuous Infusions:  sodium chloride Stopped (10/12/21 0607)    ceFAZolin (ANCEF) IV 2 g (10/14/21 0521)   PRN Meds: sodium chloride, acetaminophen, bisacodyl, nitroGLYCERIN, ondansetron (ZOFRAN) IV, sodium chloride, sodium chloride flush   Vital Signs    Vitals:   10/14/21 0300 10/14/21 0500 10/14/21 0600  10/14/21 0700  BP: (!) 107/43  (!) 116/51 (!) 121/53  Pulse: 63 87 73 74  Resp:   15 17  Temp: 99 F (37.2 C)     TempSrc: Oral     SpO2: 94% 95% 92% 94%  Weight:  65.7 kg    Height:        Intake/Output Summary (Last 24 hours) at 10/14/2021 0749 Last data filed at 10/14/2021 0200 Gross per 24 hour  Intake 525.67 ml  Output 1200 ml  Net -674.33 ml    Last 3 Weights 10/14/2021 10/13/2021 10/12/2021  Weight (lbs) 144 lb 13.5 oz 147 lb 11.3 oz 150 lb 12.7 oz  Weight (kg) 65.7 kg 67 kg 68.4 kg      Telemetry    NSR - Personally Reviewed  ECG    No new tracing today - Personally Reviewed  Physical Exam   GEN: No acute distress.   Neck: No JVD Cardiac: RRR, 3/6 late peaking systolic murmur  Respiratory: Crackles at the bases bilaterally.  GI: Soft, nontender, non-distended  MS: Trace edema in the hands; no LE edema. Warm Neuro:  Nonfocal  Psych: Normal affect   Labs    High Sensitivity Troponin:   Recent Labs  Lab 10/09/21 1837 10/09/21 2209 10/10/21 0605 10/10/21 1015 10/11/21 0022  TROPONINIHS 15  45* 154* 199* 199*      Chemistry Recent Labs  Lab 10/12/21 0928 10/13/21 0312 10/13/21 1816 10/14/21 0042  NA 132* 131*  --  134*  K 3.6 3.9  --  4.5  CL 96* 96*  --  95*  CO2 25 25  --  27  GLUCOSE 268* 160*  --  264*  BUN 28* 27*  --  31*  CREATININE 1.18* 1.10* 1.17* 1.25*  CALCIUM 8.2* 8.3*  --  8.6*  MG 2.0 2.0  --  1.9  PROT 6.1*  --   --   --   ALBUMIN 2.5*  --   --   --   AST 38  --   --   --   ALT 31  --   --   --   ALKPHOS 76  --   --   --   BILITOT 0.7  --   --   --   GFRNONAA 49* 54* 50* 46*  ANIONGAP 11 10  --  12     Lipids  Recent Labs  Lab 10/10/21 0605  CHOL 99  TRIG 122  HDL 39*  LDLCALC 36  CHOLHDL 2.5     Hematology Recent Labs  Lab 10/12/21 0124 10/13/21 0312 10/13/21 1816  WBC 6.7 7.6 8.3  RBC 2.80* 2.77* 3.36*  HGB 8.7* 8.7* 10.5*  HCT 26.1* 25.8* 31.4*  MCV 93.2 93.1 93.5  MCH 31.1 31.4 31.3  MCHC  33.3 33.7 33.4  RDW 13.6 13.6 13.6  PLT 145* 171 200    Thyroid No results for input(s): TSH, FREET4 in the last 168 hours.  BNP Recent Labs  Lab 10/09/21 2210  BNP 511.4*     DDimer No results for input(s): DDIMER in the last 168 hours.   Radiology    MR ABDOMEN W WO CONTRAST  Result Date: 10/13/2021 CLINICAL DATA:  Indeterminate heterogeneously enhancing left renal masses on CTA. EXAM: MRI ABDOMEN WITHOUT AND WITH CONTRAST TECHNIQUE: Multiplanar multisequence MR imaging of the abdomen was performed both before and after the administration of intravenous contrast. CONTRAST:  36m GADAVIST GADOBUTROL 1 MMOL/ML IV SOLN COMPARISON:  Abdominal CTA 10/12/2021. Chest CT 01/15/2021 and abdominopelvic CT 12/30/2009. FINDINGS: Lower chest: Mild atelectasis at the lung bases. No significant pleural effusion. Hepatobiliary: The liver is normal in signal without focal abnormality or abnormal enhancement. There is stable mild extrahepatic biliary dilatation status post cholecystectomy. No evidence of choledocholithiasis. Pancreas: Mild atrophy. No evidence of focal mass lesion, ductal dilatation or surrounding inflammation. Spleen: Normal in size without focal abnormality. Adrenals/Urinary Tract: Both adrenal glands appear normal. Corresponding with the areas questioned on recent CT is mild restricted diffusion in the upper pole of the left kidney measuring 2.7 cm on image 91/9 and in the posterior interpolar region, measuring 1.8 cm on image 96/9. However, no abnormal T2 signal or enhancement is seen in these areas. There are small renal cortical cysts bilaterally, largest in the interpolar region of the right kidney measuring 1.1 cm. No evidence of hydronephrosis or perinephric soft tissue stranding. Stomach/Bowel: Mild wall thickening and mucosal hyperenhancement of the distal esophagus. The stomach and visualized small bowel appear unremarkable. There is moderate stool throughout the colon.  Vascular/Lymphatic: There are no enlarged abdominal lymph nodes. Aortic and branch vessel atherosclerosis, better seen on CT. No evidence of large vessel occlusion or aneurysm. The portal, superior mesenteric, splenic and renal veins appear normal. Other: No ascites. Musculoskeletal: No acute or significant osseous findings. There is  a hemangioma in the T9 vertebral body. IMPRESSION: 1. No focal mass lesion or abnormal enhancement in the left kidney to correspond with the recent findings on CT. There is mild restricted diffusion in these areas, and focal pyelonephritis is the favored etiology. Small renal cortical cysts are present. Recommend follow-up abdominopelvic CT without and with contrast in 3-6 months. 2. Stable extrahepatic biliary dilatation status post cholecystectomy. 3. Mild wall thickening and mucosal hyperenhancement of the distal esophagus, suggesting reflux-mediated esophagitis. 4.  Aortic Atherosclerosis (ICD10-I70.0). Electronically Signed   By: Richardean Sale M.D.   On: 10/13/2021 18:04   CT CORONARY MORPH W/CTA COR W/SCORE W/CA W/CM &/OR WO/CM  Addendum Date: 10/12/2021   ADDENDUM REPORT: 10/12/2021 21:44 CLINICAL DATA:  Severe Aortic Stenosis. EXAM: Cardiac TAVR CT TECHNIQUE: A non-contrast, gated CT scan was obtained with axial slices of 3 mm through the heart for aortic valve calcium scoring. A 90 kV retrospective, gated, contrast cardiac scan was obtained. Gantry rotation speed was 250 msecs and collimation was 0.6 mm. Nitroglycerin was not given. The 3D data set was reconstructed in 5% intervals of the 0-95% of the R-R cycle. Systolic and diastolic phases were analyzed on a dedicated workstation using MPR, MIP, and VRT modes. The patient received 100 cc of contrast. FINDINGS: Image quality: Excellent. Noise artifact is: Limited. Valve Morphology: The aortic valve is tricuspid with severe diffuse calcifications. There is acquired fusion of the RCC/LCC. Aortic Valve Calcium score: 798  Aortic annular dimension: Phase assessed: 30% Annular area: 329 mm2 Annular perimeter: 66.0 mm Max diameter: 23.3 mm Min diameter: 18.8 mm Membranous septum length: 8.4 mm Annular and subannular calcification: Trace, layered single calcification under the [place]. Optimal coplanar projection: LAO 21 CRA 6 Coronary Artery Height above Annulus: Left Main: 13.5 mm Right Coronary: 16.7 mm Sinus of Valsalva Measurements: Non-coronary: 28.3 mm Right-coronary: 27.0 mm Left-coronary: 28.6 mm Sinus of Valsalva Height: Non-coronary: 17.6 mm Right-coronary: 21.2 mm Left-coronary: 19.9 mm Sinotubular Junction: 24.1 mm.  Mild calcifications. Ascending Thoracic Aorta: 25.9 mm.  Mild calcifications. Coronary Arteries: Normal coronary origin. Right dominance. The study was performed without use of NTG and is insufficient for plaque evaluation. Please refer to recent cardiac catheterization for coronary assessment. Severe 3-vessel coronary calcifications noted. Cardiac Morphology: Right Atrium: Right atrial size is within normal limits. Right Ventricle: The right ventricular cavity is within normal limits. Left Atrium: Left atrial size is dilated with no left atrial appendage filling defect. Small PFO present. Left Ventricle: The ventricular cavity size is within normal limits. There are no stigmata of prior infarction. There is no abnormal filling defect. Normal left ventricular function, EF=71%. No regional wall motion abnormalities. Pulmonary arteries: Normal in size without proximal filling defect. Pulmonary veins: Normal pulmonary venous drainage. Pericardium: Normal thickness with no significant effusion or calcium present. Mitral Valve: The mitral valve is degenerative with moderate to severe mitral annular calcification. Extra-cardiac findings: See attached radiology report for non-cardiac structures. IMPRESSION: 1. Tricuspid aortic valve with severe diffuse calcifications. There is acquired fusion of the RCC/LCC. 2. Small  aortic valve annulus (329 mm2). Would consider a 26 mm Evolut Pro as sinus measurements are appropriate and good sinus heights noted. No significant annular or subannular calcifications. 3. Sufficient coronary to annulus distance. 4. Optimal Fluoroscopic Angle for Delivery: LAO 21 CRA 6 [place] T. Audie Box, MD Electronically Signed   By: Eleonore Chiquito M.D.   On: 10/12/2021 21:44   Result Date: 10/12/2021 EXAM: OVER-READ INTERPRETATION  CT CHEST The following report is  an over-read performed by radiologist Dr. Vinnie Langton of [facility], [facility] on 10/12/2021. This over-read does not include interpretation of cardiac or coronary anatomy or pathology. The coronary calcium score/coronary CTA interpretation by the cardiologist is attached. COMPARISON:  Chest CT 01/15/2021. FINDINGS: Extracardiac findings will be described separately under dictation for contemporaneously obtained CTA chest, abdomen and pelvis. IMPRESSION: Please see separate dictation for contemporaneously obtained CTA chest, abdomen and pelvis dated 10/12/2021 for full description of relevant extracardiac findings. Electronically Signed: By: Vinnie Langton M.D. On: 10/12/2021 09:21   CT ANGIO CHEST AORTA W/CM & OR WO/CM  Result Date: 10/12/2021 CLINICAL DATA:  70 year old female with history of severe aortic stenosis. Preprocedural study prior to potential transcatheter aortic valve replacement (TAVR) procedure. EXAM: CT ANGIOGRAPHY CHEST, ABDOMEN AND PELVIS TECHNIQUE: Multidetector CT imaging through the chest, abdomen and pelvis was performed using the standard protocol during bolus administration of intravenous contrast. Multiplanar reconstructed images and MIPs were obtained and reviewed to evaluate the vascular anatomy. CONTRAST:  143m OMNIPAQUE IOHEXOL 350 MG/ML SOLN COMPARISON:  Chest CT 01/15/2021. CT the abdomen and pelvis 12/30/2009. FINDINGS: CTA CHEST FINDINGS Cardiovascular: Heart size is mildly enlarged. There is no  significant pericardial fluid, thickening or pericardial calcification. There is aortic atherosclerosis, as well as atherosclerosis of the great vessels of the mediastinum and the coronary arteries, including calcified atherosclerotic plaque in the left main, left anterior descending, left circumflex and right coronary arteries. Severe thickening and calcification of the aortic valve. Calcifications of the mitral annulus. Mediastinum/Lymph Nodes: No pathologically enlarged mediastinal or hilar lymph nodes. Esophagus is unremarkable in appearance. No axillary lymphadenopathy. Lungs/Pleura: Chronic area of ground-glass attenuation, septal thickening and regional architectural distortion in the apex of the right upper lobe, similar to the prior study, likely some chronic fibrosis related to prior right axillary radiation therapy. 3 mm right upper lobe pulmonary nodule (axial image 44 of series 5). No acute consolidative airspace disease. Trace bilateral pleural effusions lying dependently (left-greater-than-right). Musculoskeletal/Soft Tissues: There are no aggressive appearing lytic or blastic lesions noted in the visualized portions of the skeleton. CTA ABDOMEN AND PELVIS FINDINGS Hepatobiliary: No suspicious cystic or solid hepatic lesions. No intra or extrahepatic biliary ductal dilatation. Status post cholecystectomy. Pancreas: No pancreatic mass. No pancreatic ductal dilatation. No pancreatic or peripancreatic fluid collections or inflammatory changes. Spleen: Unremarkable. Adrenals/Urinary Tract: There are 2 heterogeneously enhancing lesions in the left kidney which are unusual in appearance, but concerning for potential neoplasm, largest of which is in the lateral aspect of the upper pole of the left kidney (axial image 122 of series 4) measuring 4.0 x 2.3 cm. The smaller lesion is in the posterolateral aspect of the interpolar region of the left kidney (axial image 132 of series 4) measuring 1.8 x 1.1 cm. 1.3  cm low-attenuation lesion in the lateral aspect of the interpolar region of the right kidney is compatible with a simple cyst. Bilateral adrenal glands are normal in appearance. No hydroureteronephrosis. Urinary bladder is nearly decompressed, but otherwise unremarkable in appearance. Stomach/Bowel: The appearance of the stomach is normal. There is no pathologic dilatation of small bowel or colon. Numerous colonic diverticulae are noted, without surrounding inflammatory changes to suggest an acute diverticulitis at this time. Normal appendix. Vascular/Lymphatic: Aortic atherosclerosis, without evidence of aneurysm or dissection in the abdominal or pelvic vasculature. Vascular findings and measurements pertinent to potential TAVR procedure, as detailed below. No lymphadenopathy noted in the abdomen or pelvis. Reproductive: Status post hysterectomy.  Ovaries are atrophic. Other: No  significant volume of ascites.  No pneumoperitoneum. Musculoskeletal: There are no aggressive appearing lytic or blastic lesions noted in the visualized portions of the skeleton. VASCULAR MEASUREMENTS PERTINENT TO TAVR: AORTA: Minimal Aortic Diameter-7 x 8 mm Severity of Aortic Calcification-severe RIGHT PELVIS: Right Common Iliac Artery - Minimal Diameter-7.4 x 8.5 mm Tortuosity-mild Calcification-mild Right External Iliac Artery - Minimal Diameter-6.5 x 6.8 mm Tortuosity-mild Calcification-minimal Right Common Femoral Artery - Minimal Diameter-6.7 x 5.8 mm Tortuosity-mild Calcification-mild LEFT PELVIS: Left Common Iliac Artery - Minimal Diameter-6.6 x 7.3 mm Tortuosity-mild Calcification-mild Left External Iliac Artery - Minimal Diameter-7.3 x 6.5 mm Tortuosity-mild Calcification-minimal Left Common Femoral Artery - Minimal Diameter-6.0 x 6.2 mm Tortuosity-mild Calcification-mild Review of the MIP images confirms the above findings. IMPRESSION: 1. Vascular findings and measurements pertinent to potential TAVR procedure, as detailed  above. 2. Severe thickening calcification of the aortic valve, compatible with reported clinical history of severe aortic stenosis. 3. 2 unusual appearing heterogeneously enhancing lesions in the left kidney, concerning for neoplasm. Further evaluation with nonemergent abdominal MRI with and without IV gadolinium is strongly recommended in the near future to provide more definitive characterization. 4. 3 mm right upper lobe pulmonary nodule, nonspecific, but statistically likely benign. No follow-up needed if patient is low-risk. Non-contrast chest CT can be considered in 12 months if patient is high-risk. This recommendation follows the consensus statement: Guidelines for Management of Incidental Pulmonary Nodules Detected on CT Images: From the Fleischner Society 2017; Radiology 2017; 284:228-243. 5. Trace bilateral pleural effusions (left-greater-than-right). 6. Colonic diverticulosis without evidence of acute diverticulitis at this time. Electronically Signed   By: Vinnie Langton M.D.   On: 10/12/2021 13:04   CT Angio Abd/Pel w/ and/or w/o  Result Date: 10/12/2021 CLINICAL DATA:  70 year old female with history of severe aortic stenosis. Preprocedural study prior to potential transcatheter aortic valve replacement (TAVR) procedure. EXAM: CT ANGIOGRAPHY CHEST, ABDOMEN AND PELVIS TECHNIQUE: Multidetector CT imaging through the chest, abdomen and pelvis was performed using the standard protocol during bolus administration of intravenous contrast. Multiplanar reconstructed images and MIPs were obtained and reviewed to evaluate the vascular anatomy. CONTRAST:  14m OMNIPAQUE IOHEXOL 350 MG/ML SOLN COMPARISON:  Chest CT 01/15/2021. CT the abdomen and pelvis 12/30/2009. FINDINGS: CTA CHEST FINDINGS Cardiovascular: Heart size is mildly enlarged. There is no significant pericardial fluid, thickening or pericardial calcification. There is aortic atherosclerosis, as well as atherosclerosis of the great vessels of  the mediastinum and the coronary arteries, including calcified atherosclerotic plaque in the left main, left anterior descending, left circumflex and right coronary arteries. Severe thickening and calcification of the aortic valve. Calcifications of the mitral annulus. Mediastinum/Lymph Nodes: No pathologically enlarged mediastinal or hilar lymph nodes. Esophagus is unremarkable in appearance. No axillary lymphadenopathy. Lungs/Pleura: Chronic area of ground-glass attenuation, septal thickening and regional architectural distortion in the apex of the right upper lobe, similar to the prior study, likely some chronic fibrosis related to prior right axillary radiation therapy. 3 mm right upper lobe pulmonary nodule (axial image 44 of series 5). No acute consolidative airspace disease. Trace bilateral pleural effusions lying dependently (left-greater-than-right). Musculoskeletal/Soft Tissues: There are no aggressive appearing lytic or blastic lesions noted in the visualized portions of the skeleton. CTA ABDOMEN AND PELVIS FINDINGS Hepatobiliary: No suspicious cystic or solid hepatic lesions. No intra or extrahepatic biliary ductal dilatation. Status post cholecystectomy. Pancreas: No pancreatic mass. No pancreatic ductal dilatation. No pancreatic or peripancreatic fluid collections or inflammatory changes. Spleen: Unremarkable. Adrenals/Urinary Tract: There are 2 heterogeneously enhancing lesions in the  left kidney which are unusual in appearance, but concerning for potential neoplasm, largest of which is in the lateral aspect of the upper pole of the left kidney (axial image 122 of series 4) measuring 4.0 x 2.3 cm. The smaller lesion is in the posterolateral aspect of the interpolar region of the left kidney (axial image 132 of series 4) measuring 1.8 x 1.1 cm. 1.3 cm low-attenuation lesion in the lateral aspect of the interpolar region of the right kidney is compatible with a simple cyst. Bilateral adrenal glands are  normal in appearance. No hydroureteronephrosis. Urinary bladder is nearly decompressed, but otherwise unremarkable in appearance. Stomach/Bowel: The appearance of the stomach is normal. There is no pathologic dilatation of small bowel or colon. Numerous colonic diverticulae are noted, without surrounding inflammatory changes to suggest an acute diverticulitis at this time. Normal appendix. Vascular/Lymphatic: Aortic atherosclerosis, without evidence of aneurysm or dissection in the abdominal or pelvic vasculature. Vascular findings and measurements pertinent to potential TAVR procedure, as detailed below. No lymphadenopathy noted in the abdomen or pelvis. Reproductive: Status post hysterectomy.  Ovaries are atrophic. Other: No significant volume of ascites.  No pneumoperitoneum. Musculoskeletal: There are no aggressive appearing lytic or blastic lesions noted in the visualized portions of the skeleton. VASCULAR MEASUREMENTS PERTINENT TO TAVR: AORTA: Minimal Aortic Diameter-7 x 8 mm Severity of Aortic Calcification-severe RIGHT PELVIS: Right Common Iliac Artery - Minimal Diameter-7.4 x 8.5 mm Tortuosity-mild Calcification-mild Right External Iliac Artery - Minimal Diameter-6.5 x 6.8 mm Tortuosity-mild Calcification-minimal Right Common Femoral Artery - Minimal Diameter-6.7 x 5.8 mm Tortuosity-mild Calcification-mild LEFT PELVIS: Left Common Iliac Artery - Minimal Diameter-6.6 x 7.3 mm Tortuosity-mild Calcification-mild Left External Iliac Artery - Minimal Diameter-7.3 x 6.5 mm Tortuosity-mild Calcification-minimal Left Common Femoral Artery - Minimal Diameter-6.0 x 6.2 mm Tortuosity-mild Calcification-mild Review of the MIP images confirms the above findings. IMPRESSION: 1. Vascular findings and measurements pertinent to potential TAVR procedure, as detailed above. 2. Severe thickening calcification of the aortic valve, compatible with reported clinical history of severe aortic stenosis. 3. 2 unusual appearing  heterogeneously enhancing lesions in the left kidney, concerning for neoplasm. Further evaluation with nonemergent abdominal MRI with and without IV gadolinium is strongly recommended in the near future to provide more definitive characterization. 4. 3 mm right upper lobe pulmonary nodule, nonspecific, but statistically likely benign. No follow-up needed if patient is low-risk. Non-contrast chest CT can be considered in 12 months if patient is high-risk. This recommendation follows the consensus statement: Guidelines for Management of Incidental Pulmonary Nodules Detected on CT Images: From the Fleischner Society 2017; Radiology 2017; 284:228-243. 5. Trace bilateral pleural effusions (left-greater-than-right). 6. Colonic diverticulosis without evidence of acute diverticulitis at this time. Electronically Signed   By: Vinnie Langton M.D.   On: 10/12/2021 13:04    Cardiac Studies   MRI Abdomen/Pelvis 10/13/21: IMPRESSION: 1. No focal mass lesion or abnormal enhancement in the left kidney to correspond with the recent findings on CT. There is mild restricted diffusion in these areas, and focal pyelonephritis is the favored etiology. Small renal cortical cysts are present. Recommend follow-up abdominopelvic CT without and with contrast in 3-6 months. 2. Stable extrahepatic biliary dilatation status post cholecystectomy. 3. Mild wall thickening and mucosal hyperenhancement of the distal esophagus, suggesting reflux-mediated esophagitis. 4.  Aortic Atherosclerosis (ICD10-I70.0).   TTE 10/10/21: IMPRESSIONS   1. Normal LV function; calcified aortic valve with severe AS (mean  gradient 40 mmHg; DI 0.21); mac with mild MS (mean gradient 5 mmHg) and  mild MR.  2. Left ventricular ejection fraction, by estimation, is 60 to 65%. The  left ventricle has normal function. The left ventricle has no regional  wall motion abnormalities. There is mild left ventricular hypertrophy of  the basal-septal  segment. Left  ventricular diastolic parameters are consistent with Grade I diastolic  dysfunction (impaired relaxation). Elevated left atrial pressure. The  average left ventricular global longitudinal strain is -18.4 %. The global  longitudinal strain is normal.   3. Right ventricular systolic function is normal. The right ventricular  size is normal.   4. The mitral valve is normal in structure. Mild mitral valve  regurgitation. Mild mitral stenosis. Moderate mitral annular  calcification.   5. The aortic valve is calcified. Aortic valve regurgitation is mild.  Severe aortic valve stenosis.   6. The inferior vena cava is normal in size with greater than 50%  respiratory variability, suggesting right atrial pressure of 3 mmHg.   Cath 10/10/21:   Ost RCA lesion is 75% stenosed.   RPAV lesion is 90% stenosed.   Ost LAD to Prox LAD lesion is 40% stenosed.   Previously placed Prox LAD to Mid LAD stent (unknown type) is  widely patent.  Patient Profile     70 y.o. female with past medical history of coronary artery disease status post PCI of the LAD 2013, mild to moderate aortic stenosis, diabetes mellitus, hypertension, hyperlipidemia, breast cancer admitted with chest pain and dyspnea.  Electrocardiogram showed diffuse ST depression. Cardiac catheterization revealed 75% ostial right coronary artery, 90% RP AV and 40% ostial to proximal LAD; left ventricular end-diastolic pressure 8 mmHg. TTE with normal EF 60-65%. severe AS with mean gradient 35mHg, DI 0.21. Now being worked up for possible PCI/TAVR vs CABG/AVR (high risk surgery candidate due to history of XRT). Course has been complicated by E. Coli bacteremia now on CTX.   Assessment & Plan    #NSTEMI: #Multivessel CAD: Patient presented with progressive chest pain consistent with classic angina. Cardiac catheterization 12/26 revealed 75% ostial right coronary artery, 90% RP AV and 40% ostial to proximal LAD; left ventricular  end-diastolic pressure 8 mmHg. Was recommended for medical therapy, however, patient continued to have chest pain with minimal movement and intermittently at rest. TTE with LVEF 60-65% with severe aortic stenosis. Re-evaluation of cath films with interventional team suggestive that ostial RCA and prox LAD lesions likely hemodynamically significant. Seen by CV surgery and deemed high risk for CABG/AVR due to history of chest XRT. Now planned for PCI followed by TAVR. -Plan for PCI next week with Dr. CBurt Knackfollowed by TAVR in the near future once completes course of ABX for bacteremia -Deemed high risk for CABG/AVR due to history of chest XRT -Continue ASA 876mdaily -Continue plavix 7568maily -Continue lipitor 69m35mily -Continue metoprolol 25mg53mly -Transitioned to lasix 40mg 26my today  #Severe Aortic Stenosis: TTE 12/26 with EF 60-65%, AVA 0.54, mean gradient 40mmHg52m 0.21. Determined to be high risk surgical candidate due to history of XRT to the chest for breast cancer.  -Plan for TAVR in near future once treated for E.Coli bacteremia -Deemed high risk for CABG/AVR due to chest XRT  #E.Coli Bacteremia: #Suspected Pyelonephritis on MRI: #Fevers: Patient with fever to 102 with blood culture from 12/27 positive for E.Coli. While she denied any urinary symptoms/flank pain, MRI abdomen suggestive of pyelonephritis. Initially on CTX now transitioned to cefazolin. -ID consulted, appreciate recommendations -Transitioned from CTX to cefazolin -Will discuss dispo planning with ID  #Acute on  Chronic Diastolic Heart Failure Exacerbation: Doing much better and euvolemic on exam. Has required intermittent diuresis over the week now transitioned to po lasix. Dry weight ~146lbs -Transitioned to lasix 51m PO daily -Need to be judicious with diuresis given severe AS as patient preload dependent -Continue metoprolol 236mdaily -Will add spiro/SGLT2i as able post-cath -Low Na Diet -Monitor I/Os  and daily weights  #Acute Hypoxic Respiratory Failure: Improved with diuresis.  -Wean O2 as able today -Continue diuresis as above  #Left Renal Enhancing Lesions on CTA: Noted on CT TAVR. However, MRI without evidence of focal mass lesion but rather evidence of possible pyelonephritis. On ABX as above.  #Anemia: Stable -Continue to monitor with daily CBC  #HTN: Controlled.  -Continue amlodipine 2.36m73maily -Continue metoprolol 236m84m daily  #HLD: -Continue lipitor 80mg2mly  #DMII: -ISS  #Constipation: Resolved.  -Continue colace -Continue miralax -Bisacodyl prn   For questions or updates, please contact [facility] Please consult [email or web address] for contact info under        Signed, HeathFreada Bergeron 10/14/2021, 7:49 AM

## 2021-10-20 ENCOUNTER — Telehealth: Payer: Self-pay | Admitting: *Deleted

## 2021-10-20 NOTE — Telephone Encounter (Addendum)
Coronary Stent scheduled at [facility] for: Friday October 21, 2021 [facility] Main Entrance A [facility]) at: 10 AM   Diet-no solid food after midnight prior to cath, clear liquids until 5 AM day of procedure.  Medication instructions for procedure: -Hold:  Insulin-AM of procedure/1/2 Insulin HS prior to procedure  Lasix-AM of procedure -Except hold medications usual morning medications can be taken pre-cath with sips of water including aspirin 81 mg and Plavix 75 mg    Confirmed patient has responsible adult to drive home post procedure and be with patient first 24 hours after arriving home.  [facility] does allow one visitor to accompany you and wait in the hospital waiting room while you are there for your procedure. You and your visitor will be asked to wear a mask once you enter the hospital.   Patient reports does not currently have any new symptoms concerning for COVID-19 and no household members with COVID-19 like illness.    Reviewed procedure/mask/visitor instructions with patient.

## 2021-10-21 ENCOUNTER — Ambulatory Visit (HOSPITAL_COMMUNITY)
Admission: RE | Disposition: A | Discharge status: Home / Self Care | Payer: Self-pay | Source: Home / Self Care | Attending: Cardiovascular Disease

## 2021-10-21 ENCOUNTER — Other Ambulatory Visit: Payer: Self-pay

## 2021-10-21 ENCOUNTER — Ambulatory Visit (HOSPITAL_COMMUNITY)
Admission: RE | Admit: 2021-10-21 | Discharge: 2021-10-22 | Disposition: A | Discharge status: Home / Self Care | Payer: Medicare HMO | Attending: Cardiovascular Disease | Admitting: Cardiovascular Disease

## 2021-10-21 ENCOUNTER — Encounter (HOSPITAL_COMMUNITY): Payer: Self-pay | Admitting: Cardiovascular Disease

## 2021-10-21 DIAGNOSIS — I2510 Atherosclerotic heart disease of native coronary artery without angina pectoris: Secondary | ICD-10-CM | POA: Diagnosis present

## 2021-10-21 DIAGNOSIS — I5031 Acute diastolic (congestive) heart failure: Secondary | ICD-10-CM | POA: Insufficient documentation

## 2021-10-21 DIAGNOSIS — E785 Hyperlipidemia, unspecified: Secondary | ICD-10-CM | POA: Insufficient documentation

## 2021-10-21 DIAGNOSIS — I209 Angina pectoris, unspecified: Secondary | ICD-10-CM | POA: Diagnosis present

## 2021-10-21 DIAGNOSIS — I110 Hypertensive heart disease with heart failure: Secondary | ICD-10-CM | POA: Insufficient documentation

## 2021-10-21 DIAGNOSIS — I080 Rheumatic disorders of both mitral and aortic valves: Secondary | ICD-10-CM | POA: Insufficient documentation

## 2021-10-21 DIAGNOSIS — Z955 Presence of coronary angioplasty implant and graft: Secondary | ICD-10-CM | POA: Insufficient documentation

## 2021-10-21 DIAGNOSIS — E109 Type 1 diabetes mellitus without complications: Secondary | ICD-10-CM | POA: Diagnosis not present

## 2021-10-21 DIAGNOSIS — Z20822 Contact with and (suspected) exposure to covid-19: Secondary | ICD-10-CM | POA: Diagnosis not present

## 2021-10-21 DIAGNOSIS — I25119 Atherosclerotic heart disease of native coronary artery with unspecified angina pectoris: Secondary | ICD-10-CM | POA: Diagnosis not present

## 2021-10-21 DIAGNOSIS — Z923 Personal history of irradiation: Secondary | ICD-10-CM | POA: Diagnosis not present

## 2021-10-21 DIAGNOSIS — I350 Nonrheumatic aortic (valve) stenosis: Secondary | ICD-10-CM

## 2021-10-21 DIAGNOSIS — Z853 Personal history of malignant neoplasm of breast: Secondary | ICD-10-CM | POA: Insufficient documentation

## 2021-10-21 HISTORY — PX: CORONARY PRESSURE/FFR STUDY: CATH118243

## 2021-10-21 HISTORY — PX: CORONARY STENT INTERVENTION: CATH118234

## 2021-10-21 LAB — GLUCOSE, CAPILLARY
Glucose-Capillary: 165 mg/dL — ABNORMAL HIGH (ref 70–99)
Glucose-Capillary: 228 mg/dL — ABNORMAL HIGH (ref 70–99)
Glucose-Capillary: 384 mg/dL — ABNORMAL HIGH (ref 70–99)
Glucose-Capillary: 355 mg/dL — ABNORMAL HIGH (ref 70–99)

## 2021-10-21 LAB — SARS CORONAVIRUS 2 BY RT PCR (HOSPITAL ORDER, PERFORMED IN ~~LOC~~ HOSPITAL LAB): SARS Coronavirus 2: NEGATIVE

## 2021-10-21 LAB — POCT ACTIVATED CLOTTING TIME
Activated Clotting Time: 287 seconds
Activated Clotting Time: 251 seconds
Activated Clotting Time: 299 seconds

## 2021-10-21 SURGERY — CORONARY STENT INTERVENTION
Anesthesia: LOCAL

## 2021-10-21 MED ORDER — SODIUM CHLORIDE 0.9% FLUSH: 3.0000 mL | INTRAVENOUS | Status: DC | PRN

## 2021-10-21 MED ORDER — INSULIN ASPART 100 UNIT/ML IJ SOLN
0.0000 [IU] | Freq: Every day | INTRAMUSCULAR | Status: DC
  Administered 2021-10-21: 5 [IU] via SUBCUTANEOUS

## 2021-10-21 MED ORDER — SODIUM CHLORIDE 0.9 % IV SOLN: 250.0000 mL | INTRAVENOUS | Status: DC | PRN

## 2021-10-21 MED ORDER — METOPROLOL SUCCINATE ER 25 MG PO TB24
25.0000 mg | ORAL_TABLET | Freq: Every day | ORAL | Status: DC
Start: 2021-10-22 — End: 2021-10-22
  Administered 2021-10-22: 25 mg via ORAL
  Filled 2021-10-21: qty 1

## 2021-10-21 MED ORDER — BISACODYL 5 MG PO TBEC: 10.0000 mg | DELAYED_RELEASE_TABLET | Freq: Every day | ORAL | Status: DC | PRN

## 2021-10-21 MED ORDER — SODIUM CHLORIDE 0.9% FLUSH
3.0000 mL | Freq: Two times a day (BID) | INTRAVENOUS | Status: DC
  Administered 2021-10-21 – 2021-10-22 (×2): 3 mL via INTRAVENOUS

## 2021-10-21 MED ORDER — SODIUM CHLORIDE 0.9% FLUSH
3.0000 mL | Freq: Two times a day (BID) | INTRAVENOUS | Status: DC
  Administered 2021-10-21: 3 mL via INTRAVENOUS

## 2021-10-21 MED ORDER — ACETAMINOPHEN 500 MG PO TABS: 1000.0000 mg | ORAL_TABLET | Freq: Four times a day (QID) | ORAL | Status: DC | PRN

## 2021-10-21 MED ORDER — HEPARIN SODIUM (PORCINE) 1000 UNIT/ML IJ SOLN
INTRAMUSCULAR | Status: DC | PRN
  Administered 2021-10-21: 3000 [IU] via INTRAVENOUS
  Administered 2021-10-21: 6000 [IU] via INTRAVENOUS

## 2021-10-21 MED ORDER — HYDRALAZINE HCL 20 MG/ML IJ SOLN: 10.0000 mg | INTRAMUSCULAR | Status: AC | PRN

## 2021-10-21 MED ORDER — VERAPAMIL HCL 2.5 MG/ML IV SOLN
INTRAVENOUS | Status: DC | PRN
  Administered 2021-10-21: 10 mL via INTRA_ARTERIAL

## 2021-10-21 MED ORDER — DOCUSATE SODIUM 100 MG PO CAPS
200.0000 mg | ORAL_CAPSULE | Freq: Every day | ORAL | Status: DC
  Administered 2021-10-22: 200 mg via ORAL
  Filled 2021-10-21: qty 2

## 2021-10-21 MED ORDER — INSULIN ASPART 100 UNIT/ML IJ SOLN: 0.0000 [IU] | Freq: Three times a day (TID) | INTRAMUSCULAR | Status: DC

## 2021-10-21 MED ORDER — ASPIRIN EC 81 MG PO TBEC
81.0000 mg | DELAYED_RELEASE_TABLET | Freq: Every day | ORAL | Status: DC
  Filled 2021-10-21: qty 1

## 2021-10-21 MED ORDER — IOHEXOL 350 MG/ML SOLN
INTRAVENOUS | Status: DC | PRN
  Administered 2021-10-21: 75 mL via INTRACARDIAC

## 2021-10-21 MED ORDER — MIDAZOLAM HCL 2 MG/2ML IJ SOLN
INTRAMUSCULAR | Status: DC | PRN
  Administered 2021-10-21 (×3): 1 mg via INTRAVENOUS

## 2021-10-21 MED ORDER — CLOPIDOGREL BISULFATE 75 MG PO TABS: 75.0000 mg | ORAL_TABLET | ORAL | Status: DC

## 2021-10-21 MED ORDER — HEPARIN SODIUM (PORCINE) 1000 UNIT/ML IJ SOLN
INTRAMUSCULAR | Status: AC
  Filled 2021-10-21: qty 10

## 2021-10-21 MED ORDER — FENTANYL CITRATE (PF) 100 MCG/2ML IJ SOLN
INTRAMUSCULAR | Status: AC
  Filled 2021-10-21: qty 2

## 2021-10-21 MED ORDER — INSULIN ASPART 100 UNIT/ML IJ SOLN
0.0000 [IU] | Freq: Three times a day (TID) | INTRAMUSCULAR | Status: DC
  Administered 2021-10-21: 9 [IU] via SUBCUTANEOUS

## 2021-10-21 MED ORDER — FENTANYL CITRATE (PF) 100 MCG/2ML IJ SOLN
INTRAMUSCULAR | Status: DC | PRN
  Administered 2021-10-21 (×3): 25 ug via INTRAVENOUS

## 2021-10-21 MED ORDER — CEPHALEXIN 500 MG PO CAPS
500.0000 mg | ORAL_CAPSULE | Freq: Four times a day (QID) | ORAL | Status: DC
Start: 2021-10-21 — End: 2021-10-22
  Administered 2021-10-21 – 2021-10-22 (×4): 500 mg via ORAL
  Filled 2021-10-21 (×4): qty 1

## 2021-10-21 MED ORDER — FUROSEMIDE 40 MG PO TABS
40.0000 mg | ORAL_TABLET | Freq: Every day | ORAL | Status: DC
  Administered 2021-10-22: 40 mg via ORAL
  Filled 2021-10-21: qty 1

## 2021-10-21 MED ORDER — LABETALOL HCL 5 MG/ML IV SOLN: 10.0000 mg | INTRAVENOUS | Status: AC | PRN

## 2021-10-21 MED ORDER — HEPARIN (PORCINE) IN NACL 1000-0.9 UT/500ML-% IV SOLN
INTRAVENOUS | Status: AC
  Filled 2021-10-21: qty 500

## 2021-10-21 MED ORDER — SODIUM CHLORIDE 0.9 % IV SOLN: INTRAVENOUS | Status: AC

## 2021-10-21 MED ORDER — SODIUM CHLORIDE 0.9 % WEIGHT BASED INFUSION
3.0000 mL/kg/h | INTRAVENOUS | Status: DC
  Administered 2021-10-21: 3 mL/kg/h via INTRAVENOUS

## 2021-10-21 MED ORDER — LOPERAMIDE HCL 1 MG/5ML PO LIQD: 2.0000 mg | ORAL | Status: DC | PRN

## 2021-10-21 MED ORDER — LORATADINE 10 MG PO TABS: 10.0000 mg | ORAL_TABLET | Freq: Every day | ORAL | Status: DC

## 2021-10-21 MED ORDER — INSULIN ASPART 100 UNIT/ML IJ SOLN
0.0000 [IU] | Freq: Three times a day (TID) | INTRAMUSCULAR | Status: DC
  Administered 2021-10-22: 3 [IU] via SUBCUTANEOUS

## 2021-10-21 MED ORDER — MOMETASONE FURO-FORMOTEROL FUM 200-5 MCG/ACT IN AERO
2.0000 | INHALATION_SPRAY | Freq: Two times a day (BID) | RESPIRATORY_TRACT | Status: DC
  Administered 2021-10-21 – 2021-10-22 (×2): 2 via RESPIRATORY_TRACT
  Filled 2021-10-21: qty 8.8

## 2021-10-21 MED ORDER — VERAPAMIL HCL 2.5 MG/ML IV SOLN
INTRAVENOUS | Status: AC
  Filled 2021-10-21: qty 2

## 2021-10-21 MED ORDER — SODIUM CHLORIDE 0.9 % WEIGHT BASED INFUSION: 1.0000 mL/kg/h | INTRAVENOUS | Status: DC

## 2021-10-21 MED ORDER — NITROGLYCERIN 1 MG/10 ML FOR IR/CATH LAB
INTRA_ARTERIAL | Status: DC | PRN
  Administered 2021-10-21 (×3): 100 ug via INTRACORONARY

## 2021-10-21 MED ORDER — ONDANSETRON HCL 4 MG/2ML IJ SOLN: 4.0000 mg | Freq: Four times a day (QID) | INTRAMUSCULAR | Status: DC | PRN

## 2021-10-21 MED ORDER — ASPIRIN 81 MG PO CHEW: 81.0000 mg | CHEWABLE_TABLET | ORAL | Status: DC

## 2021-10-21 MED ORDER — NITROGLYCERIN 0.4 MG SL SUBL: 0.4000 mg | SUBLINGUAL_TABLET | SUBLINGUAL | Status: DC | PRN

## 2021-10-21 MED ORDER — MIDAZOLAM HCL 2 MG/2ML IJ SOLN
INTRAMUSCULAR | Status: AC
  Filled 2021-10-21: qty 2

## 2021-10-21 MED ORDER — INSULIN NPH (HUMAN) (ISOPHANE) 100 UNIT/ML ~~LOC~~ SUSP
18.0000 [IU] | Freq: Two times a day (BID) | SUBCUTANEOUS | Status: DC
  Administered 2021-10-21 – 2021-10-22 (×2): 18 [IU] via SUBCUTANEOUS
  Filled 2021-10-21 (×2): qty 10

## 2021-10-21 MED ORDER — NITROGLYCERIN 1 MG/10 ML FOR IR/CATH LAB
INTRA_ARTERIAL | Status: AC
  Filled 2021-10-21: qty 10

## 2021-10-21 MED ORDER — FLUTICASONE PROPIONATE 50 MCG/ACT NA SUSP: 1.0000 | NASAL | Status: DC | PRN

## 2021-10-21 MED ORDER — LIDOCAINE HCL (PF) 1 % IJ SOLN
INTRAMUSCULAR | Status: DC | PRN
  Administered 2021-10-21: 4 mL via SUBCUTANEOUS

## 2021-10-21 MED ORDER — LIDOCAINE HCL (PF) 1 % IJ SOLN
INTRAMUSCULAR | Status: AC
  Filled 2021-10-21: qty 30

## 2021-10-21 MED ORDER — ATORVASTATIN CALCIUM 80 MG PO TABS
80.0000 mg | ORAL_TABLET | Freq: Every day | ORAL | Status: DC
  Administered 2021-10-21: 80 mg via ORAL
  Filled 2021-10-21: qty 1

## 2021-10-21 MED ORDER — CLOPIDOGREL BISULFATE 75 MG PO TABS
75.0000 mg | ORAL_TABLET | Freq: Every day | ORAL | Status: DC
  Administered 2021-10-22: 75 mg via ORAL
  Filled 2021-10-21: qty 1

## 2021-10-21 SURGICAL SUPPLY — 19 items
BALLN SAPPHIRE ~~LOC~~ 2.5X15 (BALLOONS) ×1 IMPLANT
BALLN SAPPHIRE ~~LOC~~ 2.75X12 (BALLOONS) ×1 IMPLANT
CATH OPTICROSS HD (CATHETERS) ×1 IMPLANT
CATH VISTA GUIDE 6FR JR4 SH (CATHETERS) ×1 IMPLANT
CATH VISTA GUIDE 6FR XBLAD3.0 (CATHETERS) ×1 IMPLANT
DEVICE RAD COMP TR BAND LRG (VASCULAR PRODUCTS) ×1 IMPLANT
ELECT DEFIB PAD ADLT CADENCE (PAD) ×1 IMPLANT
GLIDESHEATH SLEND SS 6F .021 (SHEATH) ×1 IMPLANT
GUIDEWIRE INQWIRE 1.5J.035X260 (WIRE) IMPLANT
GUIDEWIRE PRESSURE X 175 (WIRE) ×1 IMPLANT
INQWIRE 1.5J .035X260CM (WIRE) ×2
KIT HEART LEFT (KITS) ×2 IMPLANT
PACK CARDIAC CATHETERIZATION (CUSTOM PROCEDURE TRAY) ×2 IMPLANT
SLED PULL BACK IVUS (MISCELLANEOUS) ×1 IMPLANT
STENT ONYX FRONTIER 2.5X26 (Permanent Stent) ×1 IMPLANT
TRANSDUCER W/STOPCOCK (MISCELLANEOUS) ×2 IMPLANT
TUBING CIL FLEX 10 FLL-RA (TUBING) ×2 IMPLANT
VALVE COPILOT STAT (MISCELLANEOUS) ×1 IMPLANT
WIRE COUGAR XT STRL 190CM (WIRE) ×1 IMPLANT

## 2021-10-21 NOTE — Progress Notes (Signed)
°  Transition of Care (TOC) Screening Note   Patient Details  Name: Lyana Asbill Motl Date of Birth: December 15, 1950   Transition of Care [facility]) CM/SW Contact:    Milas Gain, [place] Phone Number: 10/21/2021, 4:07 PM    Transition of Care Department [facility]) has reviewed patient and no TOC needs have been identified at this time. We will continue to monitor patient advancement through interdisciplinary progression rounds. If new patient transition needs arise, please place a TOC consult.

## 2021-10-21 NOTE — Progress Notes (Addendum)
Patient c/o pain at surgical site without touch. Patient arrive with minimum bruising at the site.Now the brusng has spreaded and was more swollen and fingers is turning cyanoti.   Molly Deforest PA was at bedside advised not to remove air and reassess in 10 mins. 12 cc Present. Charge nurse released 1 cc and applied pressure for 1+/- 10 mins. Cath lab called. Kermit RN (cath Financial risk analyst) came to bedside and advised to remove 3 cc in 30-45 mins.

## 2021-10-21 NOTE — Progress Notes (Signed)
Inpatient Diabetes Program Recommendations  AACE/ADA: New Consensus Statement on Inpatient Glycemic Control (2015)  Target Ranges:  Prepandial:   less than 140 mg/dL      Peak postprandial:   less than 180 mg/dL (1-2 hours)      Critically ill patients:  140 - 180 mg/dL   Lab Results  Component Value Date   GLUCAP 228 (H) 10/21/2021   HGBA1C 7.8 (H) 10/09/2021    Review of Glycemic Control  Latest Reference Range & Units 10/21/21 10:12 10/21/21 12:57  Glucose-Capillary 70 - 99 mg/dL 165 (H) 228 (H)   Diabetes history: DM  Outpatient Diabetes medications:  NPH 18 units bid, Novolin R 3-5 units per SSI Current orders for Inpatient glycemic control:  NPH 18 units bid Inpatient Diabetes Program Recommendations:   Referral received. Recommend changing NPH to 10 units bid (8 am and 10 pm) and add Novolog sensitive correction tid with meals and HS.   Thanks,  Adah Perl, RN, BC-ADM Inpatient Diabetes Coordinator Pager [phone]  (8a-5p)

## 2021-10-21 NOTE — Interval H&P Note (Signed)
Cath Lab Visit (complete for each Cath Lab visit)  Clinical Evaluation Leading to the Procedure:   ACS: No.  Non-ACS:    Anginal Classification: CCS III  Anti-ischemic medical therapy: Minimal Therapy (1 class of medications)  Non-Invasive Test Results: No non-invasive testing performed  Prior CABG: No previous CABG      History and Physical Interval Note:  10/21/2021 10:54 AM  Molly Hill  has presented today for surgery, with the diagnosis of cad.  The various methods of treatment have been discussed with the patient and family. After consideration of risks, benefits and other options for treatment, the patient has consented to  Procedure(s): CORONARY STENT INTERVENTION (N/A) as a surgical intervention.  The patient's history has been reviewed, patient examined, no change in status, stable for surgery.  I have reviewed the patient's chart and labs.  Questions were answered to the patient's satisfaction.    The patient returns for PCI today. She has done well since hospital DC on medical therapy, complex situation with severe aortic stenosis and multivessel CAD. Again reviewed plans to proceed with PCI of severe stenosis in the ostium of the RCA and perform FFR-guided PCI of the LAD if the lesion is found to be hemodynamically significant.   Sherren Mocha

## 2021-10-21 NOTE — Progress Notes (Addendum)
Paged Molly Deforest PA regarding. Accu checks and insulin coverage. I was advised to disregard sliding scale order at this time and continue with Home med NPH 18 units BID. Diabetes coordinator consulted. Patient states that her normal amount coverage after glucose monitoring is 6-8 units sliding scale before she eats.

## 2021-10-22 DIAGNOSIS — I080 Rheumatic disorders of both mitral and aortic valves: Secondary | ICD-10-CM | POA: Diagnosis not present

## 2021-10-22 DIAGNOSIS — I209 Angina pectoris, unspecified: Secondary | ICD-10-CM

## 2021-10-22 DIAGNOSIS — I25119 Atherosclerotic heart disease of native coronary artery with unspecified angina pectoris: Secondary | ICD-10-CM | POA: Diagnosis not present

## 2021-10-22 DIAGNOSIS — Z20822 Contact with and (suspected) exposure to covid-19: Secondary | ICD-10-CM | POA: Diagnosis not present

## 2021-10-22 DIAGNOSIS — I350 Nonrheumatic aortic (valve) stenosis: Secondary | ICD-10-CM

## 2021-10-22 DIAGNOSIS — Z955 Presence of coronary angioplasty implant and graft: Secondary | ICD-10-CM | POA: Diagnosis not present

## 2021-10-22 LAB — BASIC METABOLIC PANEL
Anion gap: 7 (ref 5–15)
BUN: 30 mg/dL — ABNORMAL HIGH (ref 8–23)
CO2: 30 mmol/L (ref 22–32)
Calcium: 9.4 mg/dL (ref 8.9–10.3)
Chloride: 98 mmol/L (ref 98–111)
Creatinine, Ser: 1.32 mg/dL — ABNORMAL HIGH (ref 0.44–1.00)
GFR, Estimated: 43 mL/min — ABNORMAL LOW (ref >60)
Glucose, Bld: 185 mg/dL — ABNORMAL HIGH (ref 70–99)
Potassium: 4.1 mmol/L (ref 3.5–5.1)
Sodium: 135 mmol/L (ref 135–145)

## 2021-10-22 LAB — GLUCOSE, CAPILLARY
Glucose-Capillary: 202 mg/dL — ABNORMAL HIGH (ref 70–99)
Glucose-Capillary: 331 mg/dL — ABNORMAL HIGH (ref 70–99)

## 2021-10-22 LAB — CBC
HCT: 28.5 % — ABNORMAL LOW (ref 36.0–46.0)
Hemoglobin: 9.4 g/dL — ABNORMAL LOW (ref 12.0–15.0)
MCH: 31.4 pg (ref 26.0–34.0)
MCHC: 33 g/dL (ref 30.0–36.0)
MCV: 95.3 fL (ref 80.0–100.0)
Platelets: 308 10*3/uL (ref 150–400)
RBC: 2.99 MIL/uL — ABNORMAL LOW (ref 3.87–5.11)
RDW: 14.2 % (ref 11.5–15.5)
WBC: 8.3 10*3/uL (ref 4.0–10.5)
nRBC: 0 % (ref 0.0–0.2)

## 2021-10-22 NOTE — Progress Notes (Signed)
Progress Note  Patient Name: Molly Hill Date of Encounter: 10/22/2021  [facility] Cardiologist: Kirk Ruths, MD   Subjective   " Im ready to go home"  No CP  no SOB   Inpatient Medications    Scheduled Meds:  aspirin EC  81 mg Oral QHS   atorvastatin  80 mg Oral QHS   cephALEXin  500 mg Oral Q6H   clopidogrel  75 mg Oral Daily   docusate sodium  200 mg Oral Daily   furosemide  40 mg Oral Daily   insulin aspart  0-5 Units Subcutaneous QHS   insulin aspart  0-9 Units Subcutaneous TID WC   insulin NPH Human  18 Units Subcutaneous BID AC   metoprolol succinate  25 mg Oral Daily   mometasone-formoterol  2 puff Inhalation BID   sodium chloride flush  3 mL Intravenous Q12H   sodium chloride flush  3 mL Intravenous Q12H   Continuous Infusions:  sodium chloride     PRN Meds: sodium chloride, acetaminophen, bisacodyl, nitroGLYCERIN, ondansetron (ZOFRAN) IV, sodium chloride flush   Vital Signs    Vitals:   10/21/21 2047 10/21/21 2101 10/22/21 0016 10/22/21 0505  BP:  (!) 104/44 (!) 107/49 (!) 131/46  Pulse:  83 73 78  Resp:  _0 Temp:  98.4 F (36.9 C) 98.7 F (37.1 C) 98.6 F (37 C)  TempSrc:  Oral Oral Oral  SpO2: 97% 94% 91% 95%  Weight:      Height:       No intake or output data in the 24 hours ending 10/22/21 0734 Last 3 Weights 10/21/2021 10/14/2021 10/13/2021  Weight (lbs) 140 lb 144 lb 13.5 oz 147 lb 11.3 oz  Weight (kg) 63.504 kg 65.7 kg 67 kg      Telemetry    SR- Personally Reviewed  ECG     No new - Personally Reviewed  Physical Exam   GEN: No acute distress.   Neck: No JVD Cardiac: RRR,Gr III/VI sys mumrur LSB to base  Respiratory: Clear to auscultation bilaterally. GI: Soft, nontender, non-distended  MS: No edema; No deformity.   R wrist with bruising   Soft  No hematoma  Neuro:  Nonfocal  Psych: Normal affect   Labs    High Sensitivity Troponin:   Recent Labs  Lab 10/09/21 1837 10/09/21 2209 10/10/21 0605  10/10/21 1015 10/11/21 0022  TROPONINIHS 15 45* 154* 199* 199*     Chemistry Recent Labs  Lab 10/22/21 0228  NA 135  K 4.1  CL 98  CO2 30  GLUCOSE 185*  BUN 30*  CREATININE 1.32*  CALCIUM 9.4  GFRNONAA 43*  ANIONGAP 7    Lipids No results for input(s): CHOL, TRIG, HDL, LABVLDL, LDLCALC, CHOLHDL in the last 168 hours.  Hematology Recent Labs  Lab 10/22/21 0228  WBC 8.3  RBC 2.99*  HGB 9.4*  HCT 28.5*  MCV 95.3  MCH 31.4  MCHC 33.0  RDW 14.2  PLT 308   Thyroid No results for input(s): TSH, FREET4 in the last 168 hours.  BNPNo results for input(s): BNP, PROBNP in the last 168 hours.  DDimer No results for input(s): DDIMER in the last 168 hours.   Radiology    CARDIAC CATHETERIZATION  Result Date: 10/21/2021   RPAV lesion is 90% stenosed.   Ost RCA lesion is 50% stenosed.   Ost LAD to Prox LAD lesion is 75% stenosed.   Non-stenotic Prox LAD to Mid LAD lesion  was previously treated.   A drug-eluting stent was successfully placed using a STENT ONYX FRONTIER 2.5X26.   Post intervention, there is a 10% residual stenosis. 1.  Negative FFR evaluation of the RCA, moderate nonobstructive lesion 2.  Severe flow-limiting diffuse calcific proximal LAD stenosis of 75%, strongly positive RFR of 0.75, treated successfully with high-pressure noncompliant balloon angioplasty and stenting using a 2.75 x 26 mm Onyx frontier DES optimized with IVUS imaging. Overnight observation in the setting of severe aortic stenosis and proximal LAD intervention. Home tomorrow am if no complications arise. DAPT with ASA and plavix at least 12 months. Staged TAVR after recovery from PCI.    Cardiac Studies    Cardiac cath   10/21/20    RPAV lesion is 90% stenosed.   Ost RCA lesion is 50% stenosed.   Ost LAD to Prox LAD lesion is 75% stenosed.   Non-stenotic Prox LAD to Mid LAD lesion was previously treated.   A drug-eluting stent was successfully placed using a STENT ONYX FRONTIER 2.5X26.   Post  intervention, there is a 10% residual stenosis.   1.  Negative FFR evaluation of the RCA, moderate nonobstructive lesion 2.  Severe flow-limiting diffuse calcific proximal LAD stenosis of 75%, strongly positive RFR of 0.75, treated successfully with high-pressure noncompliant balloon angioplasty and stenting using a 2.75 x 26 mm Onyx frontier DES optimized with IVUS imaging.   Overnight observation in the setting of severe aortic stenosis and proximal LAD intervention. Home tomorrow am if no complications arise. DAPT with ASA and plavix at least 12 months. Staged TAVR after recovery from PCI.    TTE 10/10/21: IMPRESSIONS   1. Normal LV function; calcified aortic valve with severe AS (mean  gradient 40 mmHg; DI 0.21); mac with mild MS (mean gradient 5 mmHg) and  mild MR.   2. Left ventricular ejection fraction, by estimation, is 60 to 65%. The  left ventricle has normal function. The left ventricle has no regional  wall motion abnormalities. There is mild left ventricular hypertrophy of  the basal-septal segment. Left  ventricular diastolic parameters are consistent with Grade I diastolic  dysfunction (impaired relaxation). Elevated left atrial pressure. The  average left ventricular global longitudinal strain is -18.4 %. The global  longitudinal strain is normal.   3. Right ventricular systolic function is normal. The right ventricular  size is normal.   4. The mitral valve is normal in structure. Mild mitral valve  regurgitation. Mild mitral stenosis. Moderate mitral annular  calcification.   5. The aortic valve is calcified. Aortic valve regurgitation is mild.  Severe aortic valve stenosis.   6. The inferior vena cava is normal in size with greater than 50%  respiratory variability, suggesting right atrial pressure of 3 mmHg.    Cath 10/10/21:   Ost RCA lesion is 75% stenosed.   RPAV lesion is 90% stenosed.   Ost LAD to Prox LAD lesion is 40% stenosed.   Previously placed Prox LAD  to Mid LAD stent (unknown type) is  widely patent. _____________  Coronary CTA 10/12/21: IMPRESSION: 1. Tricuspid aortic valve with severe diffuse calcifications. There is acquired fusion of the RCC/LCC.   2. Small aortic valve annulus (329 mm2). Would consider a 26 mm Evolut Pro as sinus measurements are appropriate and good sinus heights noted. No significant annular or subannular calcifications.   3. Sufficient coronary to annulus distance.   4. Optimal Fluoroscopic Angle for Delivery: LAO 21 CRA 6   CT abdomen 10/12/21:  IMPRESSION: 1. Vascular findings and measurements pertinent to potential TAVR procedure, as detailed above. 2. Severe thickening calcification of the aortic valve, compatible with reported clinical history of severe aortic stenosis. 3. 2 unusual appearing heterogeneously enhancing lesions in the left kidney, concerning for neoplasm. Further evaluation with nonemergent abdominal MRI with and without IV gadolinium is strongly recommended in the near future to provide more definitive characterization. 4. 3 mm right upper lobe pulmonary nodule, nonspecific, but statistically likely benign. No follow-up needed if patient is low-risk. Non-contrast chest CT can be considered in 12 months if patient is high-risk. This recommendation follows the consensus statement: Guidelines for Management of Incidental Pulmonary Nodules Detected on CT Images: From the Fleischner Society 2017; Radiology 2017; 284:228-243. 5. Trace bilateral pleural effusions (left-greater-than-right). 6. Colonic diverticulosis without evidence of acute diverticulitis at this time. Patient Profile     Molly Hill is a 71 y.o. female with a hx of breast cancer s/p radiation to the right and bilateral mastectomy, hypertension, hyperlipidemia, DM Type I on insulin, remote tobacco abuse, anemia, mild carotid artery stenosis, CAD with prior stenting to the mLAD 2013, and bicuspid aortic valve  disease who is being seen today for the evaluation of severe aortic stenosis at the request of Dr. Johney Frame  Assessment & Plan    1  CAD     Pt iwht multivessel CAD as noted above   Underwent intervention to LAD as noted above   Plan for DAPT (ASA and Plavix ) for 12 months      OK to d/c today  No bowling for 2 wks    2  Aortic stenosis.  Pt with severe AS   Plan for TAVR after recovery from PCI    3 . Diastolic CHF   Volume status looks good   4.  HTN  BP overall OK    5.  DMII  Insulin at home   Was having pancakes and oatmeal this AM   Needs Low carb      For questions or updates, please contact [place] Please consult [email or web address] for contact info under        Signed, Dorris Carnes, MD  10/22/2021, 7:34 AM

## 2021-10-22 NOTE — Progress Notes (Signed)
CARDIAC REHAB PHASE I   PRE:  Rate/Rhythm: Sinus Rhythm 73  BP:  Supine: 131/40     SaO2: 96% RA  MODE:  Ambulation: 400  ft   POST:  Rate/Rhythem: 80  BP:  Supine: 130/54     SaO2: 98% RA  1030-1130 Ambulated independently without complaints or chest pain. Tolerated well.  Patient assisted back to bed with call bell within reach. Patient was given stent card. Discuss there importance of taking Plavix and aspirin. Reviewed  exercise instructions, temperature precautions. Kseniya is interested in doing phase 2 cardiac rehab going to stay with mother in [place] post PCI. Will place referral for [facility]. Patient may consider cardiac rehab in [place] if she goes back home. Heart healthy diet information. Use of sublingual nitro and when to call 911 reviewed.  Harrell Gave RN

## 2021-10-22 NOTE — Discharge Summary (Signed)
Discharge Summary    Patient ID: Molly Hill MRN: [id number]; DOB: 22-Sep-1951  Admit date: 10/21/2021 Discharge date: 10/22/2021  PCP:  Ernestene Kiel, MD   [facility] Prov[facility] Cardiologist:  Kirk Ruths, MD  Structural Heart:  Sherren Mocha, MD {  Discharge Diagnoses    Principal Problem:   Angina pectoris syndrome [facility]) Active Problems:   Coronary arteriosclerosis in native artery   Angina pectoris [facility])   Aortic stenosis   Diagnostic Studies/Procedures    Cath: 10/21/2021  RPAV lesion is 90% stenosed.   Ost RCA lesion is 50% stenosed.   Ost LAD to Prox LAD lesion is 75% stenosed.   Non-stenotic Prox LAD to Mid LAD lesion was previously treated.   A drug-eluting stent was successfully placed using a STENT ONYX FRONTIER 2.5X26.   Post intervention, there is a 10% residual stenosis.   1.  Negative FFR evaluation of the RCA, moderate nonobstructive lesion 2.  Severe flow-limiting diffuse calcific proximal LAD stenosis of 75%, strongly positive RFR of 0.75, treated successfully with high-pressure noncompliant balloon angioplasty and stenting using a 2.75 x 26 mm Onyx frontier DES optimized with IVUS imaging.   Overnight observation in the setting of severe aortic stenosis and proximal LAD intervention. Home tomorrow am if no complications arise. DAPT with ASA and plavix at least 12 months. Staged TAVR after recovery from PCI.   Diagnostic Dominance: Right Intervention   _____________   History of Present Illness     Molly Hill is a 71 y.o. female with hx of breast CA s/p radiation to the right and bilateral mastectomy, HTN, HLD, DM, tobacco use, anemia, CAD with prior stenting to the mLAD and bicuspid aortic valve disease.   Recently noticed her stamia to carry heavy luggage and groceries began to diminish. She also noticed increased fatigue and some exertional SOB however contributed this to staying very busy and active. She denies LE edema,  orthopnea, dizziness or syncope. She began to have chest pain about 2-3 weeks ago prior to recent visit. Symptoms were brief in duration therefore she did not seek further evaluation. She was seen by a pulmonologist for her SOB and was prescribed several inhalers which only marginally worked. On Christmas day she was at her daughters house. She was walking to the restroom and found herself unusually short of breath. She therefore drove herself and her mother back to [place], dropped her mother off and called EMS for transport to the ED for evaluation. On arrival, she was found to have unstable angina with subtle ST changes on her EKG in aVR with inferior depressions. Initial HsT was 45 with a peak at 199. She was placed on IV Heparin and underwent cardiac catheterization 10/11/21 which showed high grade ostial RCA with a 75% stenosis, RPAV with a 90% stenosis, and 40% stenosis of ostial LAD to proximal LAD, and previous stent proximal to mid LAD widely patent. Echo performed 10/11/2021 showed LVEF 60-65%, LV with no region wall abnormalities, mild MR and MS, mild AI and severe AS with a peak gradient of 60.2 mmHg and aortic valve area by VTI 0.54 cm2. Cardiac surgery was consulted for possible CABG/AVR versus PCI/TAVR. Prior chest imaging shows radiation changes in the right apex along with ascending aorta calcifications extending from the root to the arch felt to be secondary to chest radiation. Per Dr. Cyndia Bent, this can cause significant pericardial adhesions and render the internal mammary arteries unsuitable for use as bypass conduits. Given this, she was felt to  be higher risk for conventional surgery due to the need to replace both ascending aorta and aortic root. Case was discussed with Dr. Burt Knack for possible PCI followed by TAVR. Unfortunately the patient became febrile and blood cultures were obtained which were positive for E.Coli. ID was consulted and she was placed on IV antibiotics with continued  surveillance. She was also treated for acute CHF with IV Lasix due to increased weight, and mildly elevated LVEDP on cath. On Dr. Vivi Martens evaluation, plan was to move forward with TAVR imaging for further evaluation. CT imaging 10/12/21 showed a small aortic valve annulus (329 mm2) felt to be suitable for 26 mm Evolut Pro. There were 2 left kidney lesions concerning for neoplasm. Follow up MRI was ordered and performed showing no focal mass or lesion on the left kidney to correspond to CT findings. Small renal cortical cysts present. Recommendation for f/u abdominopelvic CT with/without contrast in 3-6 months.   She was started on plavix and set up for outpatient cardiac cath with Dr. Burt Knack.  Hospital Course     CAD: Underwent cardiac cath noted above with severe flow limiting diffuse calcific pLAD stenosis of 75% with RFR 0.75 treated with PCI/DES x1. Plan for DAPT with ASA/plavix for at least one year. Does have residual non-flow limiting disease of the RCA which was negative via FFR. Seen by CR -- on ASA, plavix, statin, metoprolol  Severe AS: following with structural team, continue work up for TAVR  HLD: on high dose statin  IDDM: will resume home insulin regimen at discharge   Patient was seen by Dr. Harrington Challenger and deemed stable for discharge home. Follow up in the office to be arranged.   Did the patient have an acute coronary syndrome (MI, NSTEMI, STEMI, etc) this admission?:  No                               Did the patient have a percutaneous coronary intervention (stent / angioplasty)?:  Yes.     Cath/PCI Registry Performance & Quality Measures: Aspirin prescribed? - Yes ADP Receptor Inhibitor (Plavix/Clopidogrel, Brilinta/Ticagrelor or Effient/Prasugrel) prescribed (includes medically managed patients)? - Yes High Intensity Statin (Lipitor 40-88m or Crestor 20-411m prescribed? - Yes For EF <40%, was ACEI/ARB prescribed? - Not Applicable (EF >/= 4080%For EF <40%, Aldosterone  Antagonist (Spironolactone or Eplerenone) prescribed? - Not Applicable (EF >/= 4003%Cardiac Rehab Phase II ordered? - Yes       The patient will be scheduled for a TOC follow up appointment in 10-14 days.  A message [facility] at the office where the patient should be seen for follow up.  _____________  Discharge Vitals Blood pressure (!) 105/43, pulse 80, temperature 98.6 F (37 C), temperature source Oral, resp. rate 16, height 5' (1.524 m), weight 63.5 kg, SpO2 95 %.  Filed Weights   10/21/21 1008  Weight: 63.5 kg    Labs & Radiologic Studies    CBC Recent Labs    10/22/21 0228  WBC 8.3  HGB 9.4*  HCT 28.5*  MCV 95.3  PLT 30491 Basic Metabolic Panel Recent Labs    10/22/21 0228  NA 135  K 4.1  CL 98  CO2 30  GLUCOSE 185*  BUN 30*  CREATININE 1.32*  CALCIUM 9.4   Liver Function Tests No results for input(s): AST, ALT, ALKPHOS, BILITOT, PROT, ALBUMIN in the last 72  hours. No results for input(s): LIPASE, AMYLASE in the last 72 hours. High Sensitivity Troponin:   Recent Labs  Lab 10/09/21 1837 10/09/21 2209 10/10/21 0605 10/10/21 1015 10/11/21 0022  TROPONINIHS 15 45* 154* 199* 199*    BNP Invalid input(s): POCBNP D-Dimer No results for input(s): DDIMER in the last 72 hours. Hemoglobin A1C No results for input(s): HGBA1C in the last 72 hours. Fasting Lipid Panel No results for input(s): CHOL, HDL, LDLCALC, TRIG, CHOLHDL, LDLDIRECT in the last 72 hours. Thyroid Function Tests No results for input(s): TSH, T4TOTAL, T3FREE, THYROIDAB in the last 72 hours.  Invalid input(s): FREET3 _____________  DG Chest 2 View  Result Date: 10/09/2021 CLINICAL DATA:  Chest pain, 1 week of shortness of breath. EXAM: CHEST - 2 VIEW COMPARISON:  Chest CT January 15, 2021 and chest radiograph November 22, 2020. FINDINGS: The heart size and mediastinal contours are within normal limits. Aortic atherosclerosis. Linear opacity in the  right apex appears stable likely reflecting post radiation change. No new focal airspace consolidation. No visible pleural effusion or pneumothorax. No acute osseous abnormality. Cholecystectomy clips. IMPRESSION: 1. No active cardiopulmonary disease. 2. Linear opacity in the right apex appears stable likely reflecting post treatment scarring. Electronically Signed   By: Dahlia Bailiff M.D.   On: 10/09/2021 19:10   MR ABDOMEN W WO CONTRAST  Result Date: 10/13/2021 CLINICAL DATA:  Indeterminate heterogeneously enhancing left renal masses on CTA. EXAM: MRI ABDOMEN WITHOUT AND WITH CONTRAST TECHNIQUE: Multiplanar multisequence MR imaging of the abdomen was performed both before and after the administration of intravenous contrast. CONTRAST:  59m GADAVIST GADOBUTROL 1 MMOL/ML IV SOLN COMPARISON:  Abdominal CTA 10/12/2021. Chest CT 01/15/2021 and abdominopelvic CT 12/30/2009. FINDINGS: Lower chest: Mild atelectasis at the lung bases. No significant pleural effusion. Hepatobiliary: The liver is normal in signal without focal abnormality or abnormal enhancement. There is stable mild extrahepatic biliary dilatation status post cholecystectomy. No evidence of choledocholithiasis. Pancreas: Mild atrophy. No evidence of focal mass lesion, ductal dilatation or surrounding inflammation. Spleen: Normal in size without focal abnormality. Adrenals/Urinary Tract: Both adrenal glands appear normal. Corresponding with the areas questioned on recent CT is mild restricted diffusion in the upper pole of the left kidney measuring 2.7 cm on image 91/9 and in the posterior interpolar region, measuring 1.8 cm on image 96/9. However, no abnormal T2 signal or enhancement is seen in these areas. There are small renal cortical cysts bilaterally, largest in the interpolar region of the right kidney measuring 1.1 cm. No evidence of hydronephrosis or perinephric soft tissue stranding. Stomach/Bowel: Mild wall thickening and mucosal  hyperenhancement of the distal esophagus. The stomach and visualized small bowel appear unremarkable. There is moderate stool throughout the colon. Vascular/Lymphatic: There are no enlarged abdominal lymph nodes. Aortic and branch vessel atherosclerosis, better seen on CT. No evidence of large vessel occlusion or aneurysm. The portal, superior mesenteric, splenic and renal veins appear normal. Other: No ascites. Musculoskeletal: No acute or significant osseous findings. There is a hemangioma in the T9 vertebral body. IMPRESSION: 1. No focal mass lesion or abnormal enhancement in the left kidney to correspond with the recent findings on CT. There is mild restricted diffusion in these areas, and focal pyelonephritis is the favored etiology. Small renal cortical cysts are present. Recommend follow-up abdominopelvic CT without and with contrast in 3-6 months. 2. Stable extrahepatic biliary dilatation status post cholecystectomy. 3. Mild wall thickening and mucosal hyperenhancement of the distal esophagus, suggesting reflux-mediated esophagitis. 4.  Aortic Atherosclerosis (ICD10-I70.0). Electronically  Signed   By: Richardean Sale M.D.   On: 10/13/2021 18:04   CARDIAC CATHETERIZATION  Result Date: 10/21/2021   RPAV lesion is 90% stenosed.   Ost RCA lesion is 50% stenosed.   Ost LAD to Prox LAD lesion is 75% stenosed.   Non-stenotic Prox LAD to Mid LAD lesion was previously treated.   A drug-eluting stent was successfully placed using a STENT ONYX FRONTIER 2.5X26.   Post intervention, there is a 10% residual stenosis. 1.  Negative FFR evaluation of the RCA, moderate nonobstructive lesion 2.  Severe flow-limiting diffuse calcific proximal LAD stenosis of 75%, strongly positive RFR of 0.75, treated successfully with high-pressure noncompliant balloon angioplasty and stenting using a 2.75 x 26 mm Onyx frontier DES optimized with IVUS imaging. Overnight observation in the setting of severe aortic stenosis and proximal LAD  intervention. Home tomorrow am if no complications arise. DAPT with ASA and plavix at least 12 months. Staged TAVR after recovery from PCI.   CARDIAC CATHETERIZATION  Result Date: 10/10/2021 Images from the original result were not included.   Ost RCA lesion is 75% stenosed.   RPAV lesion is 90% stenosed.   Ost LAD to Prox LAD lesion is 40% stenosed.   Previously placed Prox LAD to Mid LAD stent (unknown type) is  widely patent. Molly Hill is a 71 y.o. female  [id number] LOCATION:  FACILITY: [place] PHYSICIAN: Quay Burow, M.D. Apr 17, 1951 DATE OF PROCEDURE:  10/10/2021 DATE OF DISCHARGE: CARDIAC CATHETERIZATION History obtained from chart review.  71 year old female with a history of hypertension, hyperlipidemia, diabetes and prior history of LAD stenting in 2013.  She does have mild to moderate aortic stenosis.  She has had several weeks of chest pain when lying down.  She had worse pain tonight and came to the emergency room where she had a millimeter of ST segment elevation in lead V1 and ST segment depression in the inferior lateral leads.  She was brought to the Cath Lab for urgent cath and potential intervention.   Ms. Standifer  has a widely patent LAD stent and moderate segmental disease in the proximal LAD.  There is no other disease in the left system.  She does have a 75% true ostial RCA with mild damping of a 5 [place] catheter.  I reviewed her angiograms with Dr. De Nurse who agrees that there is no culprit lesion in the LAD territory.  In addition, her LVEDP was only 8.  We will treat her medically at this time, cycle her enzymes and heparinize her for the time being.  The radial sheath was removed and a TR band was placed on the right wrist to achieve patent hemostasis.  The patient left lab in stable condition. Quay Burow. MD, [facility] 10/10/2021 1:19 AM    CT CORONARY MORPH W/CTA COR W/SCORE W/CA W/CM &/OR WO/CM  Addendum Date: 10/12/2021   ADDENDUM REPORT: 10/12/2021 21:44 CLINICAL DATA:   Severe Aortic Stenosis. EXAM: Cardiac TAVR CT TECHNIQUE: A non-contrast, gated CT scan was obtained with axial slices of 3 mm through the heart for aortic valve calcium scoring. A 90 kV retrospective, gated, contrast cardiac scan was obtained. Gantry rotation speed was 250 msecs and collimation was 0.6 mm. Nitroglycerin was not given. The 3D data set was reconstructed in 5% intervals of the 0-95% of the R-R cycle. Systolic and diastolic phases were analyzed on a dedicated workstation using MPR, MIP, and VRT modes. The patient received 100 cc of contrast. FINDINGS: Image quality: Excellent. Noise artifact  is: Limited. Valve Morphology: The aortic valve is tricuspid with severe diffuse calcifications. There is acquired fusion of the RCC/LCC. Aortic Valve Calcium score: 798 Aortic annular dimension: Phase assessed: 30% Annular area: 329 mm2 Annular perimeter: 66.0 mm Max diameter: 23.3 mm Min diameter: 18.8 mm Membranous septum length: 8.4 mm Annular and subannular calcification: Trace, layered single calcification under the [place]. Optimal coplanar projection: LAO 21 CRA 6 Coronary Artery Height above Annulus: Left Main: 13.5 mm Right Coronary: 16.7 mm Sinus of Valsalva Measurements: Non-coronary: 28.3 mm Right-coronary: 27.0 mm Left-coronary: 28.6 mm Sinus of Valsalva Height: Non-coronary: 17.6 mm Right-coronary: 21.2 mm Left-coronary: 19.9 mm Sinotubular Junction: 24.1 mm.  Mild calcifications. Ascending Thoracic Aorta: 25.9 mm.  Mild calcifications. Coronary Arteries: Normal coronary origin. Right dominance. The study was performed without use of NTG and is insufficient for plaque evaluation. Please refer to recent cardiac catheterization for coronary assessment. Severe 3-vessel coronary calcifications noted. Cardiac Morphology: Right Atrium: Right atrial size is within normal limits. Right Ventricle: The right ventricular cavity is within normal limits. Left Atrium: Left atrial size is dilated with no left atrial  appendage filling defect. Small PFO present. Left Ventricle: The ventricular cavity size is within normal limits. There are no stigmata of prior infarction. There is no abnormal filling defect. Normal left ventricular function, EF=71%. No regional wall motion abnormalities. Pulmonary arteries: Normal in size without proximal filling defect. Pulmonary veins: Normal pulmonary venous drainage. Pericardium: Normal thickness with no significant effusion or calcium present. Mitral Valve: The mitral valve is degenerative with moderate to severe mitral annular calcification. Extra-cardiac findings: See attached radiology report for non-cardiac structures. IMPRESSION: 1. Tricuspid aortic valve with severe diffuse calcifications. There is acquired fusion of the RCC/LCC. 2. Small aortic valve annulus (329 mm2). Would consider a 26 mm Evolut Pro as sinus measurements are appropriate and good sinus heights noted. No significant annular or subannular calcifications. 3. Sufficient coronary to annulus distance. 4. Optimal Fluoroscopic Angle for Delivery: LAO 21 CRA 6 [place] T. Audie Box, MD Electronically Signed   By: Eleonore Chiquito M.D.   On: 10/12/2021 21:44   Result Date: 10/12/2021 EXAM: OVER-READ INTERPRETATION  CT CHEST The following report is an over-read performed by radiologist Dr. Vinnie Langton of [facility], [place] on 10/12/2021. This over-read does not include interpretation of cardiac or coronary anatomy or pathology. The coronary calcium score/coronary CTA interpretation by the cardiologist is attached. COMPARISON:  Chest CT 01/15/2021. FINDINGS: Extracardiac findings will be described separately under dictation for contemporaneously obtained CTA chest, abdomen and pelvis. IMPRESSION: Please see separate dictation for contemporaneously obtained CTA chest, abdomen and pelvis dated 10/12/2021 for full description of relevant extracardiac findings. Electronically Signed: By: Vinnie Langton M.D. On: 10/12/2021  09:21   DG CHEST PORT 1 VIEW  Result Date: 10/10/2021 CLINICAL DATA:  Hypoxia EXAM: PORTABLE CHEST 1 VIEW COMPARISON:  10/09/2021, CT chest 01/15/2021, chest x-ray 12/02/2020 FINDINGS: fibrosis at the right apex. No acute consolidation or pleural effusion. Mild diffuse bronchitic changes. Stable cardiomediastinal silhouette with aortic atherosclerosis. IMPRESSION: No active disease. Mild diffuse bronchitic changes with probable fibrosis at right apex Electronically Signed   By: Donavan Foil M.D.   On: 10/10/2021 23:50   CT ANGIO CHEST AORTA W/CM & OR WO/CM  Result Date: 10/12/2021 CLINICAL DATA:  71 year old female with history of severe aortic stenosis. Preprocedural study prior to potential transcatheter aortic valve replacement (TAVR) procedure. EXAM: CT ANGIOGRAPHY CHEST, ABDOMEN AND PELVIS TECHNIQUE: Multidetector CT imaging through the chest, abdomen and pelvis was  performed using the standard protocol during bolus administration of intravenous contrast. Multiplanar reconstructed images and MIPs were obtained and reviewed to evaluate the vascular anatomy. CONTRAST:  17m OMNIPAQUE IOHEXOL 350 MG/ML SOLN COMPARISON:  Chest CT 01/15/2021. CT the abdomen and pelvis 12/30/2009. FINDINGS: CTA CHEST FINDINGS Cardiovascular: Heart size is mildly enlarged. There is no significant pericardial fluid, thickening or pericardial calcification. There is aortic atherosclerosis, as well as atherosclerosis of the great vessels of the mediastinum and the coronary arteries, including calcified atherosclerotic plaque in the left main, left anterior descending, left circumflex and right coronary arteries. Severe thickening and calcification of the aortic valve. Calcifications of the mitral annulus. Mediastinum/Lymph Nodes: No pathologically enlarged mediastinal or hilar lymph nodes. Esophagus is unremarkable in appearance. No axillary lymphadenopathy. Lungs/Pleura: Chronic area of ground-glass attenuation, septal  thickening and regional architectural distortion in the apex of the right upper lobe, similar to the prior study, likely some chronic fibrosis related to prior right axillary radiation therapy. 3 mm right upper lobe pulmonary nodule (axial image 44 of series 5). No acute consolidative airspace disease. Trace bilateral pleural effusions lying dependently (left-greater-than-right). Musculoskeletal/Soft Tissues: There are no aggressive appearing lytic or blastic lesions noted in the visualized portions of the skeleton. CTA ABDOMEN AND PELVIS FINDINGS Hepatobiliary: No suspicious cystic or solid hepatic lesions. No intra or extrahepatic biliary ductal dilatation. Status post cholecystectomy. Pancreas: No pancreatic mass. No pancreatic ductal dilatation. No pancreatic or peripancreatic fluid collections or inflammatory changes. Spleen: Unremarkable. Adrenals/Urinary Tract: There are 2 heterogeneously enhancing lesions in the left kidney which are unusual in appearance, but concerning for potential neoplasm, largest of which is in the lateral aspect of the upper pole of the left kidney (axial image 122 of series 4) measuring 4.0 x 2.3 cm. The smaller lesion is in the posterolateral aspect of the interpolar region of the left kidney (axial image 132 of series 4) measuring 1.8 x 1.1 cm. 1.3 cm low-attenuation lesion in the lateral aspect of the interpolar region of the right kidney is compatible with a simple cyst. Bilateral adrenal glands are normal in appearance. No hydroureteronephrosis. Urinary bladder is nearly decompressed, but otherwise unremarkable in appearance. Stomach/Bowel: The appearance of the stomach is normal. There is no pathologic dilatation of small bowel or colon. Numerous colonic diverticulae are noted, without surrounding inflammatory changes to suggest an acute diverticulitis at this time. Normal appendix. Vascular/Lymphatic: Aortic atherosclerosis, without evidence of aneurysm or dissection in the  abdominal or pelvic vasculature. Vascular findings and measurements pertinent to potential TAVR procedure, as detailed below. No lymphadenopathy noted in the abdomen or pelvis. Reproductive: Status post hysterectomy.  Ovaries are atrophic. Other: No significant volume of ascites.  No pneumoperitoneum. Musculoskeletal: There are no aggressive appearing lytic or blastic lesions noted in the visualized portions of the skeleton. VASCULAR MEASUREMENTS PERTINENT TO TAVR: AORTA: Minimal Aortic Diameter-7 x 8 mm Severity of Aortic Calcification-severe RIGHT PELVIS: Right Common Iliac Artery - Minimal Diameter-7.4 x 8.5 mm Tortuosity-mild Calcification-mild Right External Iliac Artery - Minimal Diameter-6.5 x 6.8 mm Tortuosity-mild Calcification-minimal Right Common Femoral Artery - Minimal Diameter-6.7 x 5.8 mm Tortuosity-mild Calcification-mild LEFT PELVIS: Left Common Iliac Artery - Minimal Diameter-6.6 x 7.3 mm Tortuosity-mild Calcification-mild Left External Iliac Artery - Minimal Diameter-7.3 x 6.5 mm Tortuosity-mild Calcification-minimal Left Common Femoral Artery - Minimal Diameter-6.0 x 6.2 mm Tortuosity-mild Calcification-mild Review of the MIP images confirms the above findings. IMPRESSION: 1. Vascular findings and measurements pertinent to potential TAVR procedure, as detailed above. 2. Severe thickening calcification of the  aortic valve, compatible with reported clinical history of severe aortic stenosis. 3. 2 unusual appearing heterogeneously enhancing lesions in the left kidney, concerning for neoplasm. Further evaluation with nonemergent abdominal MRI with and without IV gadolinium is strongly recommended in the near future to provide more definitive characterization. 4. 3 mm right upper lobe pulmonary nodule, nonspecific, but statistically likely benign. No follow-up needed if patient is low-risk. Non-contrast chest CT can be considered in 12 months if patient is high-risk. This recommendation follows the  consensus statement: Guidelines for Management of Incidental Pulmonary Nodules Detected on CT Images: From the Fleischner Society 2017; Radiology 2017; 284:228-243. 5. Trace bilateral pleural effusions (left-greater-than-right). 6. Colonic diverticulosis without evidence of acute diverticulitis at this time. Electronically Signed   By: Vinnie Langton M.D.   On: 10/12/2021 13:04   VAS US DOPPLER PRE CABG  Result Date: 10/11/2021 PREOPERATIVE VASCULAR EVALUATION Patient Name:  Molly Hill  Date of Exam:   10/11/2021 Medical Rec #: [id number]       Accession #:    [id number] Date of Birth: 02-10-1951      Patient Gender: F Patient Age:   25 years Exam Location:  [facility] Procedure:      VAS US DOPPLER PRE CABG Referring Phys: Gilford Raid --------------------------------------------------------------------------------  Indications:      Pre-CABG. Risk Factors:     Hypertension, hyperlipidemia, Diabetes. Comparison Study: carotid duplex done 07/25/21 Performing Technologist: Archie Patten RVS  Examination Guidelines: A complete evaluation includes B-mode imaging, spectral Doppler, color Doppler, and power Doppler as needed of all accessible portions of each vessel. Bilateral testing is considered an integral part of a complete examination. Limited examinations for reoccurring indications may be performed as noted.  ABI Findings: +---------+------------------+-----+---------+--------+  Right     Rt Pressure (mmHg) Index Waveform  Comment   +---------+------------------+-----+---------+--------+  Brachial  130                      triphasic           +---------+------------------+-----+---------+--------+  PTA       212                1.63  triphasic           +---------+------------------+-----+---------+--------+  DP        255                1.96  triphasic           +---------+------------------+-----+---------+--------+  Great Toe 104                0.80  Normal               +---------+------------------+-----+---------+--------+ +---------+------------------+-----+---------+-------+  Left      Lt Pressure (mmHg) Index Waveform  Comment  +---------+------------------+-----+---------+-------+  Brachial  118                      triphasic          +---------+------------------+-----+---------+-------+  PTA       255                1.96  triphasic          +---------+------------------+-----+---------+-------+  DP        195                1.50  triphasic          +---------+------------------+-----+---------+-------+  Great Toe 112  0.86  Normal             +---------+------------------+-----+---------+-------+ +-------+---------------+----------------+  ABI/TBI Today's ABI/TBI Previous ABI/TBI  +-------+---------------+----------------+  Right   0.96            0.80              +-------+---------------+----------------+  Left    0.96            0.86              +-------+---------------+----------------+  Right Doppler Findings: +--------+--------+-----+---------+--------+  Site     Pressure Index Doppler   Comments  +--------+--------+-----+---------+--------+  Brachial 130            triphasic           +--------+--------+-----+---------+--------+  Radial                  triphasic           +--------+--------+-----+---------+--------+  Ulnar                   triphasic           +--------+--------+-----+---------+--------+  Left Doppler Findings: +--------+--------+-----+---------+--------+  Site     Pressure Index Doppler   Comments  +--------+--------+-----+---------+--------+  Brachial 118            triphasic           +--------+--------+-----+---------+--------+  Radial                  triphasic           +--------+--------+-----+---------+--------+  Ulnar                   triphasic           +--------+--------+-----+---------+--------+  Right ABI: Resting right ankle-brachial index indicates noncompressible right lower extremity arteries. The right toe-brachial  index is normal. Left ABI: Resting left ankle-brachial index indicates noncompressible left lower extremity arteries. The left toe-brachial index is normal. Right Upper Extremity: Doppler waveforms remain within normal limits with right radial compression. Doppler waveforms decrease 50% with right ulnar compression. Left Upper Extremity: Doppler waveforms decrease 50% with left radial compression. Doppler waveforms remain within normal limits with left ulnar compression.  Electronically signed by Harold Barban MD on 10/11/2021 at 6:43:09 PM.    Final    ECHOCARDIOGRAM LIMITED  Result Date: 10/10/2021    ECHOCARDIOGRAM LIMITED REPORT   Patient Name:   Molly Hill Date of Exam: 10/10/2021 Medical Rec #:  [id number]      Height:       61.3 in Accession #:    [id number]     Weight:       155.9 lb Date of Birth:  Jul 18, 1951     BSA:          1.704 m Patient Age:    25 years       BP:           137/56 mmHg Patient Gender: F              HR:           83 bpm. Exam Location:  Inpatient Procedure: Limited Echo, 3D Echo, Cardiac Doppler, Color Doppler and Strain            Analysis Indications:    CAD Native Vessel I25.10  History:        Patient has prior history of Echocardiogram examinations, most  recent 11/06/2020. CAD, Carotid Disease; Aortic Valve Disease and                 Mitral Valve Disease. Past history of breast cancer.  Sonographer:    Darlina Sicilian RDCS Referring Phys: [place]  1. Normal LV function; calcified aortic valve with severe AS (mean gradient 40 mmHg; DI 0.21); mac with mild MS (mean gradient 5 mmHg) and mild MR.  2. Left ventricular ejection fraction, by estimation, is 60 to 65%. The left ventricle has normal function. The left ventricle has no regional wall motion abnormalities. There is mild left ventricular hypertrophy of the basal-septal segment. Left ventricular diastolic parameters are consistent with Grade I diastolic dysfunction (impaired  relaxation). Elevated left atrial pressure. The average left ventricular global longitudinal strain is -18.4 %. The global longitudinal strain is normal.  3. Right ventricular systolic function is normal. The right ventricular size is normal.  4. The mitral valve is normal in structure. Mild mitral valve regurgitation. Mild mitral stenosis. Moderate mitral annular calcification.  5. The aortic valve is calcified. Aortic valve regurgitation is mild. Severe aortic valve stenosis.  6. The inferior vena cava is normal in size with greater than 50% respiratory variability, suggesting right atrial pressure of 3 mmHg. FINDINGS  Left Ventricle: Left ventricular ejection fraction, by estimation, is 60 to 65%. The left ventricle has normal function. The left ventricle has no regional wall motion abnormalities. The average left ventricular global longitudinal strain is -18.4 %. The global longitudinal strain is normal. The left ventricular internal cavity size was normal in size. There is mild left ventricular hypertrophy of the basal-septal segment. Left ventricular diastolic parameters are consistent with Grade I diastolic dysfunction (impaired relaxation). Elevated left atrial pressure. Right Ventricle: The right ventricular size is normal. Right ventricular systolic function is normal. Left Atrium: Left atrial size was normal in size. Right Atrium: Right atrial size was normal in size. Pericardium: There is no evidence of pericardial effusion. Mitral Valve: The mitral valve is normal in structure. Moderate mitral annular calcification. Mild mitral valve regurgitation. Mild mitral valve stenosis. MV peak gradient, 8.2 mmHg. The mean mitral valve gradient is 5.0 mmHg. Tricuspid Valve: The tricuspid valve is normal in structure. Tricuspid valve regurgitation is trivial. No evidence of tricuspid stenosis. Aortic Valve: The aortic valve is calcified. Aortic valve regurgitation is mild. Severe aortic stenosis is present. Aortic  valve mean gradient measures 40.0 mmHg. Aortic valve peak gradient measures 60.2 mmHg. Aortic valve area, by VTI measures 0.54 cm. Pulmonic Valve: The pulmonic valve was not well visualized. Pulmonic valve regurgitation is not visualized. No evidence of pulmonic stenosis. Aorta: The aortic root is normal in size and structure. Venous: The inferior vena cava is normal in size with greater than 50% respiratory variability, suggesting right atrial pressure of 3 mmHg. IAS/Shunts: No atrial level shunt detected by color flow Doppler. Additional Comments: Normal LV function; calcified aortic valve with severe AS (mean gradient 40 mmHg; DI 0.21); mac with mild MS (mean gradient 5 mmHg) and mild MR. LEFT VENTRICLE PLAX 2D LVIDd:         4.25 cm   Diastology LVIDs:         3.60 cm   LV e' medial:    4.74 cm/s LV PW:         0.80 cm   LV E/e' medial:  27.3 LV IVS:        1.30 cm   LV e' lateral:  2.95 cm/s LVOT diam:     1.80 cm   LV E/e' lateral: 43.9 LV SV:         52 LV SV Index:   31        2D Longitudinal Strain LVOT Area:     2.54 cm  2D Strain GLS Avg:     -18.4 %                           3D Volume EF:                          3D EF:        60 %                          LV EDV:       116 ml                          LV ESV:       47 ml                          LV SV:        69 ml LEFT ATRIUM         Index LA diam:    4.40 cm 2.58 cm/m  AORTIC VALVE AV Area (Vmax):    0.61 cm AV Area (Vmean):   0.52 cm AV Area (VTI):     0.54 cm AV Vmax:           387.80 cm/s AV Vmean:          303.500 cm/s AV VTI:            0.968 m AV Peak Grad:      60.2 mmHg AV Mean Grad:      40.0 mmHg LVOT Vmax:         92.90 cm/s LVOT Vmean:        61.700 cm/s LVOT VTI:          0.205 m LVOT/AV VTI ratio: 0.21  AORTA Ao Asc diam: 2.50 cm MITRAL VALVE MV Area (PHT): 3.95 cm     SHUNTS MV Area VTI:   1.46 cm     Systemic VTI:  0.20 m MV Peak grad:  8.2 mmHg     Systemic Diam: 1.80 cm MV Mean grad:  5.0 mmHg MV Vmax:       1.43 m/s MV  Vmean:      107.0 cm/s MV Decel Time: 192 msec MV E velocity: 129.50 cm/s MV A velocity: 142.00 cm/s MV E/A ratio:  0.91 Kirk Ruths MD Electronically signed by Kirk Ruths MD Signature Date/Time: 10/10/2021/12:33:40 PM    Final    CT Angio Abd/Pel w/ and/or w/o  Result Date: 10/12/2021 CLINICAL DATA:  72 year old female with history of severe aortic stenosis. Preprocedural study prior to potential transcatheter aortic valve replacement (TAVR) procedure. EXAM: CT ANGIOGRAPHY CHEST, ABDOMEN AND PELVIS TECHNIQUE: Multidetector CT imaging through the chest, abdomen and pelvis was performed using the standard protocol during bolus administration of intravenous contrast. Multiplanar reconstructed images and MIPs were obtained and reviewed to evaluate the vascular anatomy. CONTRAST:  140m OMNIPAQUE IOHEXOL 350 MG/ML SOLN COMPARISON:  Chest CT 01/15/2021. CT the abdomen and pelvis 12/30/2009. FINDINGS: CTA CHEST FINDINGS Cardiovascular: Heart size is mildly  enlarged. There is no significant pericardial fluid, thickening or pericardial calcification. There is aortic atherosclerosis, as well as atherosclerosis of the great vessels of the mediastinum and the coronary arteries, including calcified atherosclerotic plaque in the left main, left anterior descending, left circumflex and right coronary arteries. Severe thickening and calcification of the aortic valve. Calcifications of the mitral annulus. Mediastinum/Lymph Nodes: No pathologically enlarged mediastinal or hilar lymph nodes. Esophagus is unremarkable in appearance. No axillary lymphadenopathy. Lungs/Pleura: Chronic area of ground-glass attenuation, septal thickening and regional architectural distortion in the apex of the right upper lobe, similar to the prior study, likely some chronic fibrosis related to prior right axillary radiation therapy. 3 mm right upper lobe pulmonary nodule (axial image 44 of series 5). No acute consolidative airspace disease.  Trace bilateral pleural effusions lying dependently (left-greater-than-right). Musculoskeletal/Soft Tissues: There are no aggressive appearing lytic or blastic lesions noted in the visualized portions of the skeleton. CTA ABDOMEN AND PELVIS FINDINGS Hepatobiliary: No suspicious cystic or solid hepatic lesions. No intra or extrahepatic biliary ductal dilatation. Status post cholecystectomy. Pancreas: No pancreatic mass. No pancreatic ductal dilatation. No pancreatic or peripancreatic fluid collections or inflammatory changes. Spleen: Unremarkable. Adrenals/Urinary Tract: There are 2 heterogeneously enhancing lesions in the left kidney which are unusual in appearance, but concerning for potential neoplasm, largest of which is in the lateral aspect of the upper pole of the left kidney (axial image 122 of series 4) measuring 4.0 x 2.3 cm. The smaller lesion is in the posterolateral aspect of the interpolar region of the left kidney (axial image 132 of series 4) measuring 1.8 x 1.1 cm. 1.3 cm low-attenuation lesion in the lateral aspect of the interpolar region of the right kidney is compatible with a simple cyst. Bilateral adrenal glands are normal in appearance. No hydroureteronephrosis. Urinary bladder is nearly decompressed, but otherwise unremarkable in appearance. Stomach/Bowel: The appearance of the stomach is normal. There is no pathologic dilatation of small bowel or colon. Numerous colonic diverticulae are noted, without surrounding inflammatory changes to suggest an acute diverticulitis at this time. Normal appendix. Vascular/Lymphatic: Aortic atherosclerosis, without evidence of aneurysm or dissection in the abdominal or pelvic vasculature. Vascular findings and measurements pertinent to potential TAVR procedure, as detailed below. No lymphadenopathy noted in the abdomen or pelvis. Reproductive: Status post hysterectomy.  Ovaries are atrophic. Other: No significant volume of ascites.  No pneumoperitoneum.  Musculoskeletal: There are no aggressive appearing lytic or blastic lesions noted in the visualized portions of the skeleton. VASCULAR MEASUREMENTS PERTINENT TO TAVR: AORTA: Minimal Aortic Diameter-7 x 8 mm Severity of Aortic Calcification-severe RIGHT PELVIS: Right Common Iliac Artery - Minimal Diameter-7.4 x 8.5 mm Tortuosity-mild Calcification-mild Right External Iliac Artery - Minimal Diameter-6.5 x 6.8 mm Tortuosity-mild Calcification-minimal Right Common Femoral Artery - Minimal Diameter-6.7 x 5.8 mm Tortuosity-mild Calcification-mild LEFT PELVIS: Left Common Iliac Artery - Minimal Diameter-6.6 x 7.3 mm Tortuosity-mild Calcification-mild Left External Iliac Artery - Minimal Diameter-7.3 x 6.5 mm Tortuosity-mild Calcification-minimal Left Common Femoral Artery - Minimal Diameter-6.0 x 6.2 mm Tortuosity-mild Calcification-mild Review of the MIP images confirms the above findings. IMPRESSION: 1. Vascular findings and measurements pertinent to potential TAVR procedure, as detailed above. 2. Severe thickening calcification of the aortic valve, compatible with reported clinical history of severe aortic stenosis. 3. 2 unusual appearing heterogeneously enhancing lesions in the left kidney, concerning for neoplasm. Further evaluation with nonemergent abdominal MRI with and without IV gadolinium is strongly recommended in the near future to provide more definitive characterization. 4. 3 mm right upper  lobe pulmonary nodule, nonspecific, but statistically likely benign. No follow-up needed if patient is low-risk. Non-contrast chest CT can be considered in 12 months if patient is high-risk. This recommendation follows the consensus statement: Guidelines for Management of Incidental Pulmonary Nodules Detected on CT Images: From the Fleischner Society 2017; Radiology 2017; 284:228-243. 5. Trace bilateral pleural effusions (left-greater-than-right). 6. Colonic diverticulosis without evidence of acute diverticulitis at this  time. Electronically Signed   By: Vinnie Langton M.D.   On: 10/12/2021 13:04   Disposition   Pt is being discharged home today in good condition.  Follow-up Plans & Appointments     Follow-up Information     Sherren Mocha, MD Follow up.   Specialty: Cardiology Why: Office will be in touch regarding follow up for next steps in your valve replacement work up. Contact information: [address] [address] [phone]         Loel Dubonnet, NP Follow up on 11/04/2021.   Specialty: Cardiology Why: at 10:05am for your follow up with Dr. Lonia Skinner' NP Contact information: [place] [address] [phone]                Discharge Instructions     Amb Referral to Cardiac Rehabilitation   Complete by: As directed    Diagnosis: Coronary Stents   After initial evaluation and assessments completed: Virtual Based Care may be provided alone or in conjunction with Phase 2 Cardiac Rehab based on patient barriers.: Yes   Diet - low sodium heart healthy   Complete by: As directed    Discharge instructions   Complete by: As directed    Radial Site Care Refer to this sheet in the next few weeks. These instructions provide you with information on caring for yourself after your procedure. Your caregiver may also give you more specific instructions. Your treatment has been planned according to current medical practices, but problems sometimes occur. Call your caregiver if you have any problems or questions after your procedure. HOME CARE INSTRUCTIONS You may shower the day after the procedure. Remove the bandage (dressing) and gently wash the site with plain soap and water. Gently pat the site dry.  Do not apply powder or lotion to the site.  Do not submerge the affected site in water for 3 to 5 days.  Inspect the site at least twice daily.  Do not flex or bend the affected arm for 24 hours.  No lifting over 5 pounds (2.3 kg)  for 5 days after your procedure.  Do not drive home if you are discharged the same day of the procedure. Have someone else drive you.  You may drive 24 hours after the procedure unless otherwise instructed by your caregiver.  What to expect: Any bruising will usually fade within 1 to 2 weeks.  Blood that collects in the tissue (hematoma) may be painful to the touch. It should usually decrease in size and tenderness within 1 to 2 weeks.  SEEK IMMEDIATE MEDICAL CARE IF: You have unusual pain at the radial site.  You have redness, warmth, swelling, or pain at the radial site.  You have drainage (other than a small amount of blood on the dressing).  You have chills.  You have a fever or persistent symptoms for more than 72 hours.  You have a fever and your symptoms suddenly get worse.  Your arm becomes pale, cool, tingly, or numb.  You have heavy bleeding from the site. Hold pressure  on the site.   PLEASE DO NOT MISS ANY DOSES OF YOUR PLAVIX!!!!! Also keep a log of you blood pressures and bring back to your follow up appt. Please call the office with any questions.   Patients taking blood thinners should generally stay away from medicines like ibuprofen, Advil, Motrin, naproxen, and Aleve due to risk of stomach bleeding. You may take Tylenol as directed or talk to your primary doctor about alternatives.   PLEASE ENSURE THAT YOU DO NOT RUN OUT OF YOUR PLAVIX. This medication is very important to remain on for at least one year. IF you have issues obtaining this medication due to cost please CALL the office 3-5 business days prior to running out in order to prevent missing doses of this medication.   Increase activity slowly   Complete by: As directed        Discharge Medications   Allergies as of 10/22/2021       Reactions   Codeine    Hyper with vomiting   Onion Diarrhea, Nausea And Vomiting, Other (See Comments)   syncope   Reglan [metoclopramide]    "pulls muscle to side" "feel  paralyzed"        Medication List     TAKE these medications    acetaminophen 500 MG tablet Commonly known as: TYLENOL Take 1,000 mg by mouth every 6 (six) hours as needed for moderate pain or headache.   Advair HFA 230-21 MCG/ACT inhaler Generic drug: fluticasone-salmeterol Inhale 2 puffs into the lungs 2 (two) times daily.   aspirin EC 81 MG tablet Take 81 mg by mouth at bedtime.   atorvastatin 80 MG tablet Commonly known as: LIPITOR Take 1 tablet (80 mg total) by mouth at bedtime.   bisacodyl 5 MG EC tablet Generic drug: bisacodyl Take 2 tablets (10 mg total) by mouth daily as needed for moderate constipation.   CALCIUM 600 + D PO Take 1 tablet by mouth daily.   cephALEXin 500 MG capsule Commonly known as: KEFLEX Take 1 capsule (500 mg total) by mouth every 6 (six) hours.   cholecalciferol 25 MCG (1000 UNIT) tablet Commonly known as: VITAMIN D Take 1,000 Units by mouth daily.   clopidogrel 75 MG tablet Commonly known as: PLAVIX Take 1 tablet (75 mg total) by mouth daily.   docusate sodium 100 MG capsule Commonly known as: COLACE Take 2 capsules (200 mg total) by mouth daily.   fexofenadine 180 MG tablet Commonly known as: ALLEGRA Take 180 mg by mouth daily as needed for allergies.   fluticasone 50 MCG/ACT nasal spray Commonly known as: FLONASE Place into both nostrils as needed.   furosemide 40 MG tablet Commonly known as: LASIX Take 1 tablet (40 mg total) by mouth daily.   glucose blood test strip 1 each by Other route 4 (four) times daily -  before meals and at bedtime. Use as instructed   HAIR/SKIN/NAILS PO Take 1 capsule by mouth daily.   insulin NPH Human 100 UNIT/ML injection Commonly known as: NOVOLIN N Inject 18 Units into the skin 2 (two) times daily before a meal.   insulin regular 100 units/mL injection Commonly known as: NOVOLIN R Inject 3-5 Units into the skin See admin instructions. Per sliding scale 3 times daily with meals    loperamide 1 MG/5ML solution Commonly known as: IMODIUM Take by mouth as needed for diarrhea or loose stools.   metoprolol succinate 25 MG 24 hr tablet Commonly known as: Toprol XL Take 1 tablet (25  mg total) by mouth daily. What changed: when to take this   nitroGLYCERIN 0.4 MG SL tablet Commonly known as: NITROSTAT Place 1 tablet (0.4 mg total) under the tongue every 5 (five) minutes as needed for chest pain.   vitamin B-12 1000 MCG tablet Commonly known as: CYANOCOBALAMIN Take 1,000 mcg by mouth daily.           Outstanding Labs/Studies   N/a   Duration of Discharge Encounter   Greater than 30 minutes including physician time.  Signed, Reino Bellis, NP 10/22/2021, 12:14 PM

## 2021-10-24 ENCOUNTER — Telehealth: Payer: Self-pay | Admitting: Cardiology

## 2021-10-24 ENCOUNTER — Telehealth: Payer: Self-pay | Admitting: Cardiovascular Disease

## 2021-10-24 ENCOUNTER — Other Ambulatory Visit (HOSPITAL_COMMUNITY): Payer: Medicare HMO

## 2021-10-24 MED ORDER — CLOPIDOGREL BISULFATE 75 MG PO TABS: 75.0000 mg | ORAL_TABLET | Freq: Every day | ORAL | 11 refills | Status: DC

## 2021-10-24 MED FILL — Heparin Sod (Porcine)-NaCl IV Soln 1000 Unit/500ML-0.9%: INTRAVENOUS | Qty: 1000 | Status: AC

## 2021-10-24 NOTE — Telephone Encounter (Signed)
°*  STAT* If patient is at the pharmacy, call can be transferred to refill team.   1. Which medications need to be refilled? (please list name of each medication and dose if known) clopidogrel (PLAVIX) 75 MG tablet  2. Which pharmacy/location (including street and city if local pharmacy) is medication to be sent to? [id number] - [place], [place] [address] AT [address]  3. Do they need a 30 day or 90 day supply? [place]

## 2021-10-24 NOTE — Telephone Encounter (Signed)
Patient called and stated the she needed to talk with Dr. Burt Knack or nurse about procedure she is supposed to have. Please call back to discuss

## 2021-10-24 NOTE — Telephone Encounter (Signed)
Pt called stating that she would like to schedule her aortic valve replacement procedure to be done on 11/08/21. Spoke with patient and instructed her that a structural nurse navigator will be in touch with her and that I will be messaging them to let them know. Pt endorses understanding. (Pt saw Dr Burt Knack in hospital and had a cath done with stent placement) Judson Roch, RN

## 2021-10-24 NOTE — Telephone Encounter (Signed)
I spoke with the pt and made her aware that we can plan for TAVR on 1/24.  I have moved her post hospital visit to 1/18 with Nell Range PA-C and already scheduled her for PAT on 1/20.

## 2021-10-26 ENCOUNTER — Other Ambulatory Visit (HOSPITAL_COMMUNITY): Payer: Self-pay

## 2021-10-26 ENCOUNTER — Telehealth (HOSPITAL_COMMUNITY): Payer: Self-pay | Admitting: Pharmacy Technician

## 2021-10-26 NOTE — Telephone Encounter (Signed)
Transitions of Care Pharmacy   Call attempted for a pharmacy transitions of care follow-up. HIPAA appropriate voicemail was left.   Call attempt #1. Will follow-up in 2-3 days.   Parthenia Ames, PharmD

## 2021-10-27 ENCOUNTER — Telehealth (HOSPITAL_COMMUNITY): Payer: Self-pay

## 2021-10-27 ENCOUNTER — Other Ambulatory Visit (HOSPITAL_COMMUNITY): Payer: Self-pay

## 2021-10-27 NOTE — Telephone Encounter (Signed)
Pharmacy Transitions of Care Follow-up Telephone Call  Date of discharge: 10/14/2021   Discharge Diagnosis: Multivessel CAD s/p STEMI  How have you been since you were released from the hospital?  Patient doing well. Patient has not needed Advair since her discharge and has not struggled with breathing, Patient believes she was misdiagnosed with adult onset asthma and that her heart condition was making breathing difficult. Encouraged patient to call pulmonologist and tell them she no longer needs her Advair.  Medication changes made at discharge: START taking: bisacodyl (bisacodyl)  cephALEXin (KEFLEX)  docusate sodium (COLACE)  furosemide (LASIX)  CHANGE how you take: atorvastatin (LIPITOR)  STOP taking: amLODipine 2.5 MG tablet (NORVASC)  celecoxib 200 MG capsule (CELEBREX)  Efinaconazole 10 % Soln  losartan 50 MG tablet (COZAAR)   Medication changes verified by the patient?  Yes    Medication Accessibility:  [facility] [place]   Was the patient provided with refills on discharged medications? Yes   Have all prescriptions been transferred from [facility] to home pharmacy?  No, patient aware we have meds on hold but is unsure which pharmacy she will be near at time of next refill. Patient knows to call [facility] pharmacy for transfer when she needs next refill.  Is the patient able to afford medications? Patient has insurance through [place]: Clopidogrel $1.05 Eligible patient assistance: N/A    Medication Review:  CLOPIDOGREL (PLAVIX) Clopidogrel 75 mg once daily.  - Educated patient on expected duration of therapy of 81 mg ASA with clopidogrel as 1 year per cardio. - Advised patient of medications to avoid (NSAIDs, ASA)  - Educated that Tylenol (acetaminophen) will be the preferred analgesic to prevent risk of bleeding  - Emphasized importance of monitoring for signs and symptoms of bleeding (abnormal bruising, prolonged bleeding, nose bleeds, bleeding from  gums, discolored urine, black tarry stools)  - Advised patient to alert all providers of anticoagulation therapy prior to starting a new medication or having a procedure   Follow-up Appointments:  PCP Hospital f/u appt confirmed?  Patient has primary care doctor and pulmonologist but is putting follow ups on hold until valve surgery.    If their condition worsens, is the pt aware to call PCP or go to the Emergency Dept.? Yes  Final Patient Assessment:  Patient has procedure scheduled and knows to get refills sent to home pharmacy

## 2021-10-28 NOTE — Progress Notes (Signed)
Molly Hill                                     Cardiology Office Note:    Date:  11/02/2021   ID:  Molly Hill, DOB [birth year], MRN [id number]  PCP:  Ernestene Kiel, MD  [facility] Cardiologist:  Fransico Him, MD  [place] Electrophysiologist:  None   Referring MD: Ernestene Kiel, MD   Follow up PCI and discuss setting up TAVR.   History of Present Illness:    Molly Hill is a 71 y.o. female with a hx of breast CA s/p radiation to the right and bilateral mastectomy (prior imaging showed radiation changes in the right apex along with ascending aorta calcifications extending from the root to the arch felt to be secondary to chest radiation), DMT1, HTN, HLD, tobacco abuse, anemia, recent admission for NSTEMI and acute CHF in the setting of E.Coli bacteremia and pyelonephritis (09/2021), CAD s/p remote PCI of LAD in 2013 and NSTEMI with staged DES to pLAD (10/21/21), bicuspid AoV disease with severe AS/mild AI who presents to clinic for follow up.   The patient had recently noticed decreased exercise tolerance, fatigue, DOE and chest pain. She was admitted 12/25-12/30/22 for chest pain and above symptoms. She was found to have an NSTEMI and acute CHF in the setting of E.Coli bacteremia and pyelonephritis.  Echo showed LVEF 60-65% with severe aortic stenosis with a mean gradient of 40 mm hg, AVA 0.61 and DVI of 0.21. Cardiac catheterization 12/26 revealed 75% ostial right coronary artery, 90% RP AV and 40% ostial to proximal LAD; left ventricular end-diastolic pressure 8 mmHg. She was evaluated for [facility] but felt to be too high risk given radiation changes in the right apex along with ascending aorta calcifications extending from the root to the arch felt to be secondary to chest radiation. She underwent pre TAVR scans while admitted. Cardiac gated CTA of the heart revealed anatomical characteristics consistent with aortic  stenosis suitable for treatment by transcatheter aortic valve replacement without any significant complicating features and CTA of the aorta and iliac vessels demonstrated what appear to be adequate pelvic vascular access to facilitate a transfemoral approach. Plan was for discharge home with ABx and to return for staged PCI and FFR.   Staged cath 10/21/21 showed negative FFR evaluation of the RCA, moderate nonobstructive lesion and severe flow-limiting diffuse calcific proximal LAD stenosis of 75%, strongly positive RFR of 0.75, treated successfully with high-pressure noncompliant balloon angioplasty and stenting using a 2.75 x 26 mm Onyx frontier DES optimized with IVUS imaging. She was discharged on aspirin and plavix.  Today the patient presents to clinic for follow up. Here alone. She is doing much better since PCI.  She has had complete resolution of chest pain and dyspnea.  She still feels like has mild exercise intolerance but otherwise feels totally back to normal.  She has no signs or symptoms of infection including fever, chills or general malaise.  She has no lower extremity edema, orthopnea or PND.  Orthopnea has also resolved since recent admission.  She is feeling well and ready to proceed with TAVR next week.     Past Medical History:  Diagnosis Date   Alopecia    Anemia    Bicuspid aortic valve    mild to moderate AS by echo 2022   Coronary  arteriosclerosis in native artery 2013   s/p PCI of LAD   DM (diabetes mellitus), type 1 ([place])    History of breast cancer    Estrogen receptor negative. s/p chemo   Hyperglycemia    Hyperlipidemia    Hypertension    Mitral regurgitation    mild to moderate by echo 2020   Swelling of joint of both wrists    Tendinitis of left wrist     Past Surgical History:  Procedure Laterality Date   ANKLE SURGERY Bilateral    CATARACT EXTRACTION, BILATERAL     CHOLECYSTECTOMY     CORONARY STENT INTERVENTION     CORONARY STENT INTERVENTION N/A  10/21/2021   Procedure: CORONARY STENT INTERVENTION;  Surgeon: Sherren Mocha, MD;  Location: [facility];  Service: Cardiovascular;  Laterality: N/A;   CORONARY/GRAFT ACUTE MI REVASCULARIZATION N/A 10/10/2021   Procedure: CORONARY/GRAFT ACUTE MI REVASCULARIZATION;  Surgeon: Lorretta Harp, MD;  Location: [facility];  Service: Cardiovascular;  Laterality: N/A;   CTR     ELBOW SURGERY     HAND SURGERY     BOTH THUMBS AND MIDDLE FINGERS   INTRAVASCULAR PRESSURE WIRE/FFR STUDY N/A 10/21/2021   Procedure: INTRAVASCULAR PRESSURE WIRE/FFR STUDY;  Surgeon: Sherren Mocha, MD;  Location: [facility];  Service: Cardiovascular;  Laterality: N/A;   LEFT BREAST MASTECTOMY     MASCULAR  REPAIR     PORT-A-CATH REMOVAL     PORTACATH PLACEMENT     RIGHT MODIFIED MASTECTOMY Right    UPPER LIP SURGERY      Current Medications: Current Meds  Medication Sig   acetaminophen (TYLENOL) 500 MG tablet Take 1,000 mg by mouth every 6 (six) hours as needed for moderate pain or headache.   aspirin EC 81 MG tablet Take 81 mg by mouth at bedtime.   atorvastatin (LIPITOR) 80 MG tablet Take 1 tablet (80 mg total) by mouth at bedtime.   Biotin w/ Vitamins C & E (HAIR/SKIN/NAILS PO) Take 1 capsule by mouth daily.   bisacodyl (DULCOLAX) 5 MG EC tablet Take 2 tablets (10 mg total) by mouth daily as needed for moderate constipation.   Calcium Carb-Cholecalciferol (CALCIUM 600 + D PO) Take 1 tablet by mouth daily.   cholecalciferol (VITAMIN D) 25 MCG (1000 UNIT) tablet Take 1,000 Units by mouth daily.   clopidogrel (PLAVIX) 75 MG tablet Take 1 tablet (75 mg total) by mouth daily.   docusate sodium (COLACE) 100 MG capsule Take 2 capsules (200 mg total) by mouth daily.   fexofenadine (ALLEGRA) 180 MG tablet Take 180 mg by mouth daily as needed for allergies.   fluticasone (FLONASE) 50 MCG/ACT nasal spray Place into both nostrils as needed.    fluticasone-salmeterol (ADVAIR HFA) 230-21 MCG/ACT inhaler  Inhale 2 puffs into the lungs 2 (two) times daily.   furosemide (LASIX) 40 MG tablet Take 1 tablet (40 mg total) by mouth daily.   glucose blood test strip 1 each by Other route 4 (four) times daily -  before meals and at bedtime. Use as instructed   insulin NPH Human (NOVOLIN N) 100 UNIT/ML injection Inject 18 Units into the skin 2 (two) times daily before a meal.   insulin regular (NOVOLIN R) 100 units/mL injection Inject 3-5 Units into the skin See admin instructions. Per sliding scale 3 times daily with meals   loperamide (IMODIUM) 1 MG/5ML solution Take by mouth as needed for diarrhea or loose stools.   metoprolol succinate (TOPROL XL)  25 MG 24 hr tablet Take 1 tablet (25 mg total) by mouth daily. (Patient taking differently: Take 25 mg by mouth at bedtime.)   nitroGLYCERIN (NITROSTAT) 0.4 MG SL tablet Place 1 tablet (0.4 mg total) under the tongue every 5 (five) minutes as needed for chest pain.   vitamin B-12 (CYANOCOBALAMIN) 1000 MCG tablet Take 1,000 mcg by mouth daily.     Allergies:   Codeine, Onion, and Reglan [metoclopramide]   Social History   Socioeconomic History   Marital status: Widowed    Spouse name: Not on file   Number of children: Not on file   Years of education: Not on file   Highest education level: Not on file  Occupational History   Not on file  Tobacco Use   Smoking status: Former    Years: 3.00    Types: Cigarettes    Quit date: 08/15/1979    Years since quitting: 42.2   Smokeless tobacco: Never   Tobacco comments:    1 pack would last a week.  Substance and Sexual Activity   Alcohol use: Never   Drug use: Never   Sexual activity: Never  Other Topics Concern   Not on file  Social History Narrative   Not on file   Social Determinants of Health   Financial Resource Strain: Not on file  Food Insecurity: Not on file  Transportation Needs: Not on file  Physical Activity: Not on file  Stress: Not on file  Social Connections: Not on file      Family History: The patient's family history includes Breast cancer in her maternal aunt; CAD in her father; Colon cancer in her father; Diabetes in her brother, father, and paternal grandfather; Heart attack in her mother and paternal grandfather; Stroke in her father.  ROS:   Please see the history of present illness.    All other systems reviewed and are negative.  EKGs/Labs/Other Studies Reviewed:    The following studies were reviewed today:  TTE 10/10/21: IMPRESSIONS   1. Normal LV function; calcified aortic valve with severe AS (mean  gradient 40 mmHg; DI 0.21); mac with mild MS (mean gradient 5 mmHg) and  mild MR.   2. Left ventricular ejection fraction, by estimation, is 60 to 65%. The  left ventricle has normal function. The left ventricle has no regional  wall motion abnormalities. There is mild left ventricular hypertrophy of  the basal-septal segment. Left  ventricular diastolic parameters are consistent with Grade I diastolic  dysfunction (impaired relaxation). Elevated left atrial pressure. The  average left ventricular global longitudinal strain is -18.4 %. The global  longitudinal strain is normal.   3. Right ventricular systolic function is normal. The right ventricular  size is normal.   4. The mitral valve is normal in structure. Mild mitral valve  regurgitation. Mild mitral stenosis. Moderate mitral annular  calcification.   5. The aortic valve is calcified. Aortic valve regurgitation is mild.  Severe aortic valve stenosis.   6. The inferior vena cava is normal in size with greater than 50%  respiratory variability, suggesting right atrial pressure of 3 mmHg.     _____________    Cath 10/10/21:   Ost RCA lesion is 75% stenosed.   RPAV lesion is 90% stenosed.   Ost LAD to Prox LAD lesion is 40% stenosed.   Previously placed Prox LAD to Mid LAD stent (unknown type) is  widely patent.  _____________   Cath: 10/21/2021   RPAV lesion is 90%  stenosed.   Ost RCA lesion is 50% stenosed.   Ost LAD to Prox LAD lesion is 75% stenosed.   Non-stenotic Prox LAD to Mid LAD lesion was previously treated.   A drug-eluting stent was successfully placed using a STENT ONYX FRONTIER [id number].   Post intervention, there is a 10% residual stenosis.   1.  Negative FFR evaluation of the RCA, moderate nonobstructive lesion 2.  Severe flow-limiting diffuse calcific proximal LAD stenosis of 75%, strongly positive RFR of 0.75, treated successfully with high-pressure noncompliant balloon angioplasty and stenting using a 2.75 x 26 mm Onyx frontier DES optimized with IVUS imaging.   Overnight observation in the setting of severe aortic stenosis and proximal LAD intervention. Home tomorrow am if no complications arise. DAPT with ASA and plavix at least 12 months. Staged TAVR after recovery from PCI.   _____________   Cardiac CT 10/12/21 IMPRESSION: 1. Tricuspid aortic valve with severe diffuse calcifications. There is acquired fusion of the RCC/LCC.   2. Small aortic valve annulus (329 mm2). Would consider a 26 mm Evolut Pro as sinus measurements are appropriate and good sinus heights noted. No significant annular or subannular calcifications.   3. Sufficient coronary to annulus distance.   4. Optimal Fluoroscopic Angle for Delivery: LAO 21 CRA 6  _______________________   CTA chest/abd pelvis 10/12/21 IMPRESSION: 1. Vascular findings and measurements pertinent to potential TAVR procedure, as detailed above. 2. Severe thickening calcification of the aortic valve, compatible with reported clinical history of severe aortic stenosis. 3. 2 unusual appearing heterogeneously enhancing lesions in the left kidney, concerning for neoplasm. Further evaluation with nonemergent abdominal MRI with and without IV gadolinium is strongly recommended in the near future to provide more definitive characterization. 4. 3 mm right upper lobe pulmonary nodule,  nonspecific, but statistically likely benign. No follow-up needed if patient is low-risk. Non-contrast chest CT can be considered in 12 months if patient is high-risk. This recommendation follows the consensus statement: Guidelines for Management of Incidental Pulmonary Nodules Detected on CT Images: From the Fleischner Society 2017; Radiology 2017; 284:228-243. 5. Trace bilateral pleural effusions (left-greater-than-right). 6. Colonic diverticulosis without evidence of acute diverticulitis at this time.    EKG:  EKG is NOT ordered today.    Recent Labs: 11/24/2020: TSH 3.484 10/09/2021: B Natriuretic Peptide 511.4 10/12/2021: ALT 31 10/14/2021: Magnesium 1.9 10/22/2021: BUN 30; Creatinine, Ser 1.32; Hemoglobin 9.4; Platelets 308; Potassium 4.1; Sodium 135  Recent Lipid Panel    Component Value Date/Time   CHOL 99 10/10/2021 0605   CHOL 117 07/11/2021 1049   TRIG 122 10/10/2021 0605   HDL 39 (L) 10/10/2021 0605   HDL 56 07/11/2021 1049   CHOLHDL 2.5 10/10/2021 0605   VLDL 24 10/10/2021 0605   LDLCALC 36 10/10/2021 0605   LDLCALC 45 07/11/2021 1049     Risk Assessment/Calculations:       Physical Exam:    VS:  BP 124/64    Pulse 73    Ht 5' (1.524 m)    Wt 146 lb (66.2 kg)    SpO2 99%    BMI 28.51 kg/m     Wt Readings from Last 3 Encounters:  11/02/21 146 lb (66.2 kg)  10/21/21 140 lb (63.5 kg)  10/14/21 144 lb 13.5 oz (65.7 kg)     GEN:  Well nourished, well developed in no acute distress HEENT: Normal NECK: No JVD LYMPHATICS: No lymphadenopathy CARDIAC: RRR, 3/6 SEM. No rubs, gallops RESPIRATORY:  Clear to auscultation without rales, wheezing or  rhonchi  ABDOMEN: Soft, non-tender, non-distended MUSCULOSKELETAL:  No edema; No deformity  SKIN: Warm and dry  NEUROLOGIC:  Alert and oriented x 3 PSYCHIATRIC:  Normal affect   ASSESSMENT:    1. Bicuspid aortic valve   2. CAD S/P percutaneous coronary angioplasty   3. Type 1 diabetes mellitus with complications  ([place])   4. Essential hypertension   5. History of breast cancer   6. E coli bacteremia   7. Chronic diastolic CHF (congestive heart failure) (HCC)    PLAN:    In order of problems listed above:  Severe AS: she has persistent NYHA class II symptoms with exercise intolerance. Patient has completed pre-TAVR work-up and sizes to a 26 Medtronic evolute pro with transfemoral access.  Her case has been posted for 11/08/2021 with Dr. Burt Knack and Dr. Cyndia Bent.  Surgery instructions have been given.  CAD: recently admitted for NSTEMI in the setting of bacteremia/pyelonephritis. Staged cath 10/21/21 showed negative FFR evaluation of the RCA, moderate nonobstructive lesion and severe flow-limiting diffuse calcific proximal LAD stenosis of 75%, strongly positive RFR of 0.75, treated successfully with high-pressure noncompliant balloon angioplasty and stenting using a 2.75 x 26 mm Onyx frontier DES optimized with IVUS imaging. She was discharged on aspirin and plavix.  Her chest pain and dyspnea have completely resolved since PCI.  T1DM: continue sliding scale insulin  HTN: Blood pressure well controlled today however patient reports some mildly elevated blood pressures at home.  We will continue to monitor this after her TAVR.  No changes were made today  Hx of breast cancer: In remission  Recent pyelonephritis with ecoi bacteremia: treated with antibiotics and completely resolved.  Chronic diastolic heart failure: She appears euvolemic today.  Continue Lasix 40 mg daily.  We will get a BMP as a part of her preadmission work-up on Friday.    Cardiac Rehabilitation Eligibility Assessment  The patient is NOT ready to start cardiac rehabilitation due to: Other    She needs TAVR before she can start CRPII  Medication Adjustments/Labs and Tests Ordered: Current medicines are reviewed at length with the patient today.  Concerns regarding medicines are outlined above.  No orders of the defined types were  placed in this encounter.  No orders of the defined types were placed in this encounter.   Patient Instructions  Medication Instructions:  Your physician recommends that you continue on your current medications as directed. Please refer to the Current Medication list given to you today.  *If you need a refill on your cardiac medications before your next appointment, please call your pharmacy*   Lab Work: None ordered   If you have labs (blood work) drawn today and your tests are completely normal, you will receive your results only by: [place] (if you have MyChart) OR A paper copy in the mail If you have any lab test that is abnormal or we need to change your treatment, we will call you to review the results.   Testing/Procedures: None ordered    Follow-Up: Someone from out structural team will contact you with a follow up appointment    Other Instructions None     Signed, Angelena Form, PA-C  11/02/2021 2:18 PM    [facility]

## 2021-10-31 ENCOUNTER — Encounter: Payer: Self-pay | Admitting: Physician Assistant

## 2021-10-31 ENCOUNTER — Telehealth (HOSPITAL_COMMUNITY): Payer: Self-pay

## 2021-10-31 NOTE — Telephone Encounter (Signed)
Pt insurance is active and benefits verified through [place] $10, DED 0/0 met, out of pocket $3,400/0 met, co-insurance 0%. no pre-authorization required. Passport, 10/31/2021@4 :08pm, REF# [id number]  Will contact patient to see if she is interested in the Cardiac Rehab Program. If interested, patient will need to complete follow up appt. Once completed, patient will be contacted for scheduling upon review by the RN Navigator.

## 2021-11-02 ENCOUNTER — Ambulatory Visit: Payer: Medicare HMO | Admitting: Physician Assistant

## 2021-11-02 ENCOUNTER — Encounter: Payer: Self-pay | Admitting: Physician Assistant

## 2021-11-02 ENCOUNTER — Other Ambulatory Visit: Payer: Self-pay

## 2021-11-02 VITALS — BP 124/64 | HR 73 | Ht 60.0 in | Wt 146.0 lb

## 2021-11-02 DIAGNOSIS — Z9861 Coronary angioplasty status: Secondary | ICD-10-CM

## 2021-11-02 DIAGNOSIS — Q231 Congenital insufficiency of aortic valve: Secondary | ICD-10-CM | POA: Diagnosis not present

## 2021-11-02 DIAGNOSIS — I10 Essential (primary) hypertension: Secondary | ICD-10-CM | POA: Diagnosis not present

## 2021-11-02 DIAGNOSIS — E108 Type 1 diabetes mellitus with unspecified complications: Secondary | ICD-10-CM | POA: Diagnosis not present

## 2021-11-02 DIAGNOSIS — I2510 Atherosclerotic heart disease of native coronary artery without angina pectoris: Secondary | ICD-10-CM

## 2021-11-02 DIAGNOSIS — R7881 Bacteremia: Secondary | ICD-10-CM

## 2021-11-02 DIAGNOSIS — I350 Nonrheumatic aortic (valve) stenosis: Secondary | ICD-10-CM

## 2021-11-02 DIAGNOSIS — B9620 Unspecified Escherichia coli [E. coli] as the cause of diseases classified elsewhere: Secondary | ICD-10-CM

## 2021-11-02 DIAGNOSIS — Z853 Personal history of malignant neoplasm of breast: Secondary | ICD-10-CM

## 2021-11-02 DIAGNOSIS — I5032 Chronic diastolic (congestive) heart failure: Secondary | ICD-10-CM

## 2021-11-02 NOTE — Progress Notes (Signed)
Pre Surgical Assessment: 5 M Walk Test  58M=16.14ft  5 Meter Walk Test- trial 1: 6.10 seconds 5 Meter Walk Test- trial 2: 5.60 seconds 5 Meter Walk Test- trial 3: 5.88 seconds 5 Meter Walk Test Average: 5.86 seconds

## 2021-11-02 NOTE — Patient Instructions (Signed)
Medication Instructions:  Your physician recommends that you continue on your current medications as directed. Please refer to the Current Medication list given to you today.  *If you need a refill on your cardiac medications before your next appointment, please call your pharmacy*   Lab Work: None ordered   If you have labs (blood work) drawn today and your tests are completely normal, you will receive your results only by: [place] (if you have MyChart) OR A paper copy in the mail If you have any lab test that is abnormal or we need to change your treatment, we will call you to review the results.   Testing/Procedures: None ordered    Follow-Up: Someone from out structural team will contact you with a follow up appointment    Other Instructions None

## 2021-11-02 NOTE — Progress Notes (Deleted)
Needs TAVR then can do CRII

## 2021-11-03 NOTE — Pre-Procedure Instructions (Signed)
Surgical Instructions    Your procedure is scheduled on Tuesday, January 24th.  Report to [facility] Main Entrance "A" at 7:15 A.M., then check in with the Admitting office.  Call this number if you have problems the morning of surgery:  [phone]   If you have any questions prior to your surgery date call [phone]: Open Monday-Friday 8am-4pm    Remember:  Do not eat or drink after midnight the night before your surgery  As of today, STOP now taking any Aleve, Naproxen, Ibuprofen, Motrin, Advil, Goody's, BC's, all herbal medications, fish oil, and all non-prescription vitamins.   Continue taking all other medications including your Aspirin and Plavix without change through the day before surgery.   On the morning of surgery do not take any medications.  WHAT DO I DO ABOUT MY DIABETES MEDICATION?  THE NIGHT BEFORE SURGERY at [facility], TAKE 13 units of insulin NPH Human (NOVOLIN N) insulin. DO NOT TAKE your bedtime dose of insulin regular (NOVOLIN R).       THE MORNING OF SURGERY, TAKE 15  units of insulin NPH Human (NOVOLIN N). If your CBG is greater than 220 mg/dL, you may take  of your sliding scale (correction) dose of insulin regular (NOVOLIN R) insulin.   HOW TO MANAGE YOUR DIABETES BEFORE AND AFTER SURGERY  Why is it important to control my blood sugar before and after surgery? Improving blood sugar levels before and after surgery helps healing and can limit problems. A way of improving blood sugar control is eating a healthy diet by:  Eating less sugar and carbohydrates  Increasing activity/exercise  Talking with your doctor about reaching your blood sugar goals High blood sugars (greater than 180 mg/dL) can raise your risk of infections and slow your recovery, so you will need to focus on controlling your diabetes during the weeks before surgery. Make sure that the doctor who takes care of your diabetes knows about your planned surgery including the date and  location.  How do I manage my blood sugar before surgery? Check your blood sugar at least 4 times a day, starting 2 days before surgery, to make sure that the level is not too high or low.  Check your blood sugar the morning of your surgery when you wake up and every 2 hours until you get to the Short Stay unit.  If your blood sugar is less than 70 mg/dL, you will need to treat for low blood sugar: Do not take insulin. Treat a low blood sugar (less than 70 mg/dL) with  cup of clear juice (cranberry or apple), 4 glucose tablets, OR glucose gel. Recheck blood sugar in 15 minutes after treatment (to make sure it is greater than 70 mg/dL). If your blood sugar is not greater than 70 mg/dL on recheck, call [phone] for further instructions. Report your blood sugar to the short stay nurse when you get to Short Stay.  If you are admitted to the hospital after surgery: Your blood sugar will be checked by the staff and you will probably be given insulin after surgery (instead of oral diabetes medicines) to make sure you have good blood sugar levels. The goal for blood sugar control after surgery is 80-180 mg/dL.             Do NOT Smoke (Tobacco/Vaping) for 24 hours prior to your procedure.  If you use a CPAP at night, you may bring all equipment for your overnight stay.   Contacts, glasses, piercing's, hearing aid's,  dentures or partials may not be worn into surgery, please bring cases for these belongings.    For patients admitted to the hospital, discharge time will be determined by your treatment team.   Patients discharged the day of surgery will not be allowed to drive home, and someone needs to stay with them for 24 hours.  NO VISITORS WILL BE ALLOWED IN PRE-OP WHERE PATIENTS ARE PREPPED FOR SURGERY.  ONLY 1 SUPPORT PERSON MAY BE PRESENT IN THE WAITING ROOM WHILE YOU ARE IN SURGERY.  IF YOU ARE TO BE ADMITTED, ONCE YOU ARE IN YOUR ROOM YOU WILL BE ALLOWED TWO (2) VISITORS. (1) VISITOR  MAY STAY OVERNIGHT BUT MUST ARRIVE TO THE ROOM BY 8pm.  Minor children may have two parents present. Special consideration for safety and communication needs will be reviewed on a case by case basis.   Special instructions:   [place]- Preparing For Surgery  Before surgery, you can play an important role. Because skin is not sterile, your skin needs to be as free of germs as possible. You can reduce the number of germs on your skin by washing with CHG (chlorahexidine gluconate) Soap before surgery.  CHG is an antiseptic cleaner which kills germs and bonds with the skin to continue killing germs even after washing.    Oral Hygiene is also important to reduce your risk of infection.  Remember - BRUSH YOUR TEETH THE MORNING OF SURGERY WITH YOUR REGULAR TOOTHPASTE  Please do not use if you have an allergy to CHG or antibacterial soaps. If your skin becomes reddened/irritated stop using the CHG.  Do not shave (including legs and underarms) for at least 48 hours prior to first CHG shower. It is OK to shave your face.  Please follow these instructions carefully.   Shower the NIGHT BEFORE SURGERY and the MORNING OF SURGERY  If you chose to wash your hair, wash your hair first as usual with your normal shampoo.  After you shampoo, rinse your hair and body thoroughly to remove the shampoo.  Use CHG Soap as you would any other liquid soap. You can apply CHG directly to the skin and wash gently with a scrungie or a clean washcloth.   Apply the CHG Soap to your body ONLY FROM THE NECK DOWN.  Do not use on open wounds or open sores. Avoid contact with your eyes, ears, mouth and genitals (private parts). Wash Face and genitals (private parts)  with your normal soap.   Wash thoroughly, paying special attention to the area where your surgery will be performed.  Thoroughly rinse your body with warm water from the neck down.  DO NOT shower/wash with your normal soap after using and rinsing off the CHG  Soap.  Pat yourself dry with a CLEAN TOWEL.  Wear CLEAN PAJAMAS to bed the night before surgery  Place CLEAN SHEETS on your bed the night before your surgery  DO NOT SLEEP WITH PETS.   Day of Surgery: Shower with CHG soap. Do not wear jewelry, make up, nail polish, gel polish, artificial nails, or any other type of covering on natural nails including finger and toenails. If patients have artificial nails, gel coating, etc. that need to be removed by a nail salon please have this removed prior to surgery. Surgery may need to be canceled/delayed if the surgeon/anesthesiologist feels like the patient is unable to be adequately monitored. Do not wear lotions, powders, perfumes, or deodorant. Do not shave 48 hours prior to surgery.  Do not bring valuables to the hospital. [facility] is not responsible for any belongings or valuables. Wear Clean/Comfortable clothing the morning of surgery Remember to brush your teeth WITH YOUR REGULAR TOOTHPASTE.   Please read over the following fact sheets that you were given.   3 days prior to your procedure or After your COVID test   You are not required to quarantine however you are required to wear a well-fitting mask when you are out and around people not in your household. If your mask becomes wet or soiled, replace with a new one.   Wash your hands often with soap and water for 20 seconds or clean your hands with an alcohol-based hand sanitizer that contains at least 60% alcohol.   Do not share personal items.   Notify your provider:  o if you are in close contact with someone who has COVID  o or if you develop a fever of 100.4 or greater, sneezing, cough, sore throat, shortness of breath or body aches.

## 2021-11-04 ENCOUNTER — Ambulatory Visit (HOSPITAL_BASED_OUTPATIENT_CLINIC_OR_DEPARTMENT_OTHER): Payer: Medicare HMO | Admitting: Family

## 2021-11-04 ENCOUNTER — Encounter (HOSPITAL_COMMUNITY)
Admission: RE | Admit: 2021-11-04 | Discharge: 2021-11-04 | Disposition: A | Discharge status: Home / Self Care | Payer: Medicare HMO | Source: Ambulatory Visit | Attending: Cardiovascular Disease | Admitting: Cardiovascular Disease

## 2021-11-04 ENCOUNTER — Encounter (HOSPITAL_COMMUNITY): Payer: Self-pay

## 2021-11-04 ENCOUNTER — Ambulatory Visit (HOSPITAL_COMMUNITY)
Admission: RE | Admit: 2021-11-04 | Discharge: 2021-11-04 | Disposition: A | Discharge status: Home / Self Care | Payer: Medicare HMO | Source: Ambulatory Visit | Attending: Cardiovascular Disease | Admitting: Cardiovascular Disease

## 2021-11-04 ENCOUNTER — Other Ambulatory Visit: Payer: Self-pay

## 2021-11-04 VITALS — BP 134/60 | HR 71 | Temp 98.6°F | Resp 17 | Ht 60.0 in | Wt 146.8 lb

## 2021-11-04 DIAGNOSIS — Z20822 Contact with and (suspected) exposure to covid-19: Secondary | ICD-10-CM | POA: Insufficient documentation

## 2021-11-04 DIAGNOSIS — I252 Old myocardial infarction: Secondary | ICD-10-CM | POA: Diagnosis not present

## 2021-11-04 DIAGNOSIS — I350 Nonrheumatic aortic (valve) stenosis: Secondary | ICD-10-CM | POA: Diagnosis not present

## 2021-11-04 DIAGNOSIS — Z955 Presence of coronary angioplasty implant and graft: Secondary | ICD-10-CM | POA: Diagnosis not present

## 2021-11-04 DIAGNOSIS — I2510 Atherosclerotic heart disease of native coronary artery without angina pectoris: Secondary | ICD-10-CM | POA: Insufficient documentation

## 2021-11-04 DIAGNOSIS — Z01818 Encounter for other preprocedural examination: Secondary | ICD-10-CM | POA: Insufficient documentation

## 2021-11-04 HISTORY — DX: Acute myocardial infarction, unspecified: I21.9

## 2021-11-04 HISTORY — DX: Gastro-esophageal reflux disease without esophagitis: K21.9

## 2021-11-04 HISTORY — DX: Unspecified osteoarthritis, unspecified site: M19.90

## 2021-11-04 LAB — COMPREHENSIVE METABOLIC PANEL
ALT: 18 U/L (ref 0–44)
AST: 35 U/L (ref 15–41)
Albumin: 3.8 g/dL (ref 3.5–5.0)
Alkaline Phosphatase: 97 U/L (ref 38–126)
Anion gap: 10 (ref 5–15)
BUN: 25 mg/dL — ABNORMAL HIGH (ref 8–23)
CO2: 26 mmol/L (ref 22–32)
Calcium: 9.3 mg/dL (ref 8.9–10.3)
Chloride: 99 mmol/L (ref 98–111)
Creatinine, Ser: 1.57 mg/dL — ABNORMAL HIGH (ref 0.44–1.00)
GFR, Estimated: 35 mL/min — ABNORMAL LOW (ref >60)
Glucose, Bld: 243 mg/dL — ABNORMAL HIGH (ref 70–99)
Potassium: 4.7 mmol/L (ref 3.5–5.1)
Sodium: 135 mmol/L (ref 135–145)
Total Bilirubin: 0.5 mg/dL (ref 0.3–1.2)
Total Protein: 7.5 g/dL (ref 6.5–8.1)

## 2021-11-04 LAB — BLOOD GAS, ARTERIAL
Acid-Base Excess: 4.3 mmol/L — ABNORMAL HIGH (ref 0.0–2.0)
Bicarbonate: 28.5 mmol/L — ABNORMAL HIGH (ref 20.0–28.0)
Drawn by: 60286
FIO2: 21
O2 Saturation: 98.2 %
Patient temperature: 37
pCO2 arterial: 44.9 mmHg (ref 32.0–48.0)
pH, Arterial: 7.419 (ref 7.350–7.450)
pO2, Arterial: 102 mmHg (ref 83.0–108.0)

## 2021-11-04 LAB — URINALYSIS, ROUTINE W REFLEX MICROSCOPIC
Bilirubin Urine: NEGATIVE
Glucose, UA: NEGATIVE mg/dL
Hgb urine dipstick: NEGATIVE
Ketones, ur: NEGATIVE mg/dL
Nitrite: NEGATIVE
Protein, ur: NEGATIVE mg/dL
Specific Gravity, Urine: 1.008 (ref 1.005–1.030)
pH: 6 (ref 5.0–8.0)

## 2021-11-04 LAB — CBC
HCT: 35.1 % — ABNORMAL LOW (ref 36.0–46.0)
Hemoglobin: 11.4 g/dL — ABNORMAL LOW (ref 12.0–15.0)
MCH: 31.4 pg (ref 26.0–34.0)
MCHC: 32.5 g/dL (ref 30.0–36.0)
MCV: 96.7 fL (ref 80.0–100.0)
Platelets: 230 10*3/uL (ref 150–400)
RBC: 3.63 MIL/uL — ABNORMAL LOW (ref 3.87–5.11)
RDW: 13.9 % (ref 11.5–15.5)
WBC: 7.7 10*3/uL (ref 4.0–10.5)
nRBC: 0 % (ref 0.0–0.2)

## 2021-11-04 LAB — TYPE AND SCREEN
ABO/RH(D): O POS
Antibody Screen: NEGATIVE

## 2021-11-04 LAB — BRAIN NATRIURETIC PEPTIDE: B Natriuretic Peptide: 103.7 pg/mL — ABNORMAL HIGH (ref 0.0–100.0)

## 2021-11-04 LAB — GLUCOSE, CAPILLARY: Glucose-Capillary: 191 mg/dL — ABNORMAL HIGH (ref 70–99)

## 2021-11-04 LAB — SURGICAL PCR SCREEN
MRSA, PCR: NEGATIVE
Staphylococcus aureus: POSITIVE — AB

## 2021-11-04 LAB — PROTIME-INR
INR: 0.9 (ref 0.8–1.2)
Prothrombin Time: 12.6 seconds (ref 11.4–15.2)

## 2021-11-04 LAB — SARS CORONAVIRUS 2 (TAT 6-24 HRS): SARS Coronavirus 2: NEGATIVE

## 2021-11-04 NOTE — Progress Notes (Signed)
PCP - Dr. Ernestene Kiel Cardiologist - Dr. Fransico Him  PPM/ICD - n/a  Chest x-ray - 11/04/21 EKG - 10/22/21 Stress Test - 05/03/20 ECHO - 10/10/21 Cardiac Cath - 10/10/21  Sleep Study - denies CPAP - denies  Fasting Blood Sugar - 45-160 Checks Blood Sugar __5__ times a day CBG at PAT 191 Last A1C: 7.8 on 10/09/21  Blood Thinner Instructions: Plavix; cont up until the day before surgery Aspirin Instructions: Cont up until the day before surgery  NPO at MD  COVID TEST- 11/04/21; done in PAT.  Anesthesia review: Yes, heart history  Patient denies shortness of breath, fever, cough and chest pain at PAT appointment   All instructions explained to the patient, with a verbal understanding of the material. Patient agrees to go over the instructions while at home for a better understanding. Patient also instructed to self quarantine after being tested for COVID-19. The opportunity to ask questions was provided.

## 2021-11-07 ENCOUNTER — Other Ambulatory Visit: Payer: Self-pay

## 2021-11-07 MED ORDER — CLOPIDOGREL BISULFATE 75 MG PO TABS: 75.0000 mg | ORAL_TABLET | Freq: Every day | ORAL | 3 refills | Status: DC

## 2021-11-07 MED ORDER — CEFAZOLIN SODIUM-DEXTROSE 2-4 GM/100ML-% IV SOLN
2.0000 g | INTRAVENOUS | Status: AC
  Administered 2021-11-08: 10:00:00 2 g via INTRAVENOUS
  Filled 2021-11-07: qty 100

## 2021-11-07 MED ORDER — DEXMEDETOMIDINE HCL IN NACL 400 MCG/100ML IV SOLN
0.1000 ug/kg/h | INTRAVENOUS | Status: AC
  Administered 2021-11-08: 10:00:00 1 ug/kg/h via INTRAVENOUS
  Administered 2021-11-08: 10:00:00 66.6 ug via INTRAVENOUS
  Filled 2021-11-07: qty 100

## 2021-11-07 MED ORDER — POTASSIUM CHLORIDE 2 MEQ/ML IV SOLN
80.0000 meq | INTRAVENOUS | Status: DC
  Filled 2021-11-07: qty 40

## 2021-11-07 MED ORDER — MAGNESIUM SULFATE 50 % IJ SOLN
40.0000 meq | INTRAMUSCULAR | Status: DC
  Filled 2021-11-07: qty 9.85

## 2021-11-07 MED ORDER — ATORVASTATIN CALCIUM 80 MG PO TABS: 80.0000 mg | ORAL_TABLET | Freq: Every day | ORAL | 3 refills | Status: DC

## 2021-11-07 MED ORDER — HEPARIN 30,000 UNITS/1000 ML (OHS) CELLSAVER SOLUTION
Status: DC
  Filled 2021-11-07: qty 1000

## 2021-11-07 MED ORDER — NOREPINEPHRINE 4 MG/250ML-% IV SOLN
0.0000 ug/min | INTRAVENOUS | Status: DC
  Filled 2021-11-07: qty 250

## 2021-11-07 NOTE — Anesthesia Preprocedure Evaluation (Addendum)
Anesthesia Evaluation  Patient identified by MRN, date of birth, ID band Patient awake    Reviewed: Allergy & Precautions, NPO status , Patient's Chart, lab work & pertinent test results, reviewed documented beta blocker date and time   History of Anesthesia Complications Negative for: history of anesthetic complications  Airway Mallampati: II  TM Distance: >3 FB Neck ROM: Full    Dental  (+) Dental Advisory Given, Partial Upper   Pulmonary asthma , former smoker,    Pulmonary exam normal        Cardiovascular hypertension, Pt. on home beta blockers and Pt. on medications + CAD, + Past MI, + Cardiac Stents and + Peripheral Vascular Disease  + Valvular Problems/Murmurs AS and MR  Rhythm:Regular Rate:Normal + Systolic murmurs  '23 Cath - RPAV lesion is 90% stenosed.   Ost RCA lesion is 50% stenosed.   Ost LAD to Prox LAD lesion is 75% stenosed.   Non-stenotic Prox LAD to Mid LAD lesion was previously treated.   A drug-eluting stent was successfully placed using a STENT ONYX FRONTIER [id number].   Post intervention, there is a 10% residual stenosis.  '22 TTE - . EF is 60 to 65%. Mild left ventricular hypertrophy of the basal-septal segment. Grade I diastolic dysfunction (impaired relaxation). Mild MR and MS. Mild AI, severe AS.    Neuro/Psych negative neurological ROS  negative psych ROS   GI/Hepatic Neg liver ROS, GERD  Controlled,  Endo/Other  diabetes, Type 2, Insulin Dependent  Renal/GU negative Renal ROS     Musculoskeletal  (+) Arthritis ,   Abdominal   Peds  Hematology  On plavix    Anesthesia Other Findings   Reproductive/Obstetrics                            Anesthesia Physical Anesthesia Plan  ASA: 4  Anesthesia Plan: MAC   Post-op Pain Management: Tylenol PO (pre-op)   Induction:   PONV Risk Score and Plan: 2 and Propofol infusion, Treatment may vary due to age or  medical condition and Ondansetron  Airway Management Planned: Natural Airway and Simple Face Mask  Additional Equipment: None  Intra-op Plan:   Post-operative Plan:   Informed Consent: I have reviewed the patients History and Physical, chart, labs and discussed the procedure including the risks, benefits and alternatives for the proposed anesthesia with the patient or authorized representative who has indicated his/her understanding and acceptance.       Plan Discussed with: CRNA and Anesthesiologist  Anesthesia Plan Comments:        Anesthesia Quick Evaluation

## 2021-11-07 NOTE — H&P (Signed)
[address]       [place] [address]             [phone]      Cardiothoracic Surgery Admission History and Physical  [facility] Medical Record #[id number] Date of Birth: 06/26/1951   Referring: Dr. Gwenlyn Found, MD Primary Care: No primary care provider on file. Primary Cardiologist: Kirk Ruths, MD   Reason for admission: Severe aortic stenosis   History of Present Illness:       This is a 71 year old female with a past medical history of hypertension, hyperlipidemia, diabetes mellitus, remote tobacco abuse, bicuspid aortic valve, breast cancer (s/p radiation mostly on right and bilateral mastectomy) who presented to [facility] ED on 10/09/2021 with complaints of chest pain. She states pain radiated to both arms and she had shortness of breath. These symptoms have been happening off and on for the past month. She denies nausea, diaphoresis, syncope. EKG showed subtle ST elevation in aVR with inferior ST depression. Initial Troponin I (high sensitivity) was 45 and max'd to 199. Cardiac catheterization done 12/27/20222 showed ostial RCA with a 75% stenosis, RPAV with a 90% stenosis, and 40% stenosis of ostial LAD to proximal LAD, and previous stent proximal to mid LAD widely patent. Echo done 10/11/2021 showed LVEF 60-65%, LV with no region wall abnormalities, mild MR and MS, mild AI and severe AS with a peak gradient of 60.2 mmHg and aortic valve area by VTI 0.54 cm2.       Past Medical History:  Diagnosis Date   Alopecia     Anemia     Bicuspid aortic valve      mild to moderate AS by echo 2022   Carotid stenosis      1-39% bilateral carotid stenosis by dopplers 07/2021   Convalescence after chemotherapy     Coronary arteriosclerosis in native artery 2013    s/p PCI of LAD   DM (diabetes mellitus), type 1 (HCC)     Estrogen receptor negative     History of breast cancer     Hyperglycemia     Hyperlipidemia     Hypertension     Mild  dehydration     Mitral regurgitation      mild to moderate by echo 2020   Murmur, heart     Swelling of joint of both wrists     Tendinitis of left wrist             Past Surgical History:  Procedure Laterality Date   ANKLE SURGERY Bilateral     CATARACT EXTRACTION, BILATERAL       CHOLECYSTECTOMY       CORONARY STENT INTERVENTION       CORONARY/GRAFT ACUTE MI REVASCULARIZATION N/A 10/10/2021    Procedure: CORONARY/GRAFT ACUTE MI REVASCULARIZATION;  Surgeon: Lorretta Harp, MD;  Location: [facility];  Service: Cardiovascular;  Laterality: N/A;   CTR       ELBOW SURGERY       HAND SURGERY        BOTH THUMBS AND MIDDLE FINGERS   LEFT BREAST MASTECTOMY       MASCULAR  REPAIR       PORT-A-CATH REMOVAL       PORTACATH PLACEMENT       RIGHT MODIFIED MASTECTOMY Right     UPPER LIP SURGERY          Social History  Tobacco Use  Smoking Status Former   Years: 3.00   Types: Cigarettes   Quit date: 08/15/1979   Years since quitting: 42.1  Smokeless Tobacco Never  Tobacco Comments    1 pack would last a week.    Social History       Substance and Sexual Activity  Alcohol Use Never             Allergies  Allergen Reactions   Codeine        Hyper with vomiting   Onion Diarrhea, Nausea And Vomiting and Other (See Comments)      syncope   Reglan [Metoclopramide]        "pulls muscle to side" "feel paralyzed"               Current Facility-Administered Medications  Medication Dose Route Frequency Provider Last Rate Last Admin   0.9 %  sodium chloride infusion  250 mL Intravenous PRN Lorretta Harp, MD       acetaminophen (TYLENOL) tablet 650 mg  650 mg Oral Q4H PRN Lorretta Harp, MD   650 mg at 10/11/21 1009   amLODipine (NORVASC) tablet 2.5 mg  2.5 mg Oral Daily Lorretta Harp, MD   2.5 mg at 10/11/21 1003   aspirin chewable tablet 81 mg  81 mg Oral Daily Lorretta Harp, MD   81 mg at 10/11/21 1006   atorvastatin (LIPITOR) tablet 80 mg   80 mg Oral Daily Lorretta Harp, MD   80 mg at 10/11/21 1004   B-complex with vitamin C tablet 1 tablet  1 tablet Oral Daily Lorretta Harp, MD   1 tablet at 10/11/21 1003   calcium-vitamin D (OSCAL WITH D) 500-5 MG-MCG per tablet 1 tablet  1 tablet Oral Q breakfast Lorretta Harp, MD   1 tablet at 10/11/21 1610   celecoxib (CELEBREX) capsule 200 mg  200 mg Oral Daily Almyra Deforest, PA   200 mg at 10/11/21 1003   Chlorhexidine Gluconate Cloth 2 % PADS 6 each  6 each Topical Daily Lorretta Harp, MD   6 each at 10/10/21 0146   cholecalciferol (VITAMIN D3) tablet 1,000 Units  1,000 Units Oral Daily Almyra Deforest, PA   1,000 Units at 10/11/21 1004   heparin ADULT infusion 100 units/mL (25000 units/266m)  950 Units/hr Intravenous Continuous Carney, JGay Filler R[place]at 10/11/21 0859   insulin aspart (novoLOG) injection 0-5 Units  0-5 Units Subcutaneous QHS MAlmyra Deforest PA       insulin aspart (novoLOG) injection 0-9 Units  0-9 Units Subcutaneous TID WC MAlmyra Deforest PA   3 Units at 10/11/21 0838   insulin aspart (novoLOG) injection 2 Units  2 Units Subcutaneous TID WC MAlmyra Deforest PA   2 Units at 10/11/21 0839   insulin NPH Human (NOVOLIN N) injection 18 Units  18 Units Subcutaneous BID AC & HS BLorretta Harp MD   18 Units at 10/11/21 0840   MEDLINE mouth rinse  15 mL Mouth Rinse BID BLorretta Harp MD   15 mL at 10/10/21 2236   metoprolol succinate (TOPROL-XL) 24 hr tablet 25 mg  25 mg Oral Daily BLorretta Harp MD   25 mg at 10/11/21 1003   mometasone-formoterol (DULERA) 200-5 MCG/ACT inhaler 2 puff  2 puff Inhalation BID MAlmyra Deforest PA   2 puff at 10/11/21 0745   nitroGLYCERIN (NITROSTAT) SL tablet 0.4 mg  0.4 mg Sublingual Q5 Min x 3  PRN Meade Maw, MD   0.4 mg at 10/10/21 2247   ondansetron [facility]) injection 4 mg  4 mg Intravenous Q6H PRN Lorretta Harp, MD       sodium chloride flush (NS) 0.9 % injection 3 mL  3 mL Intravenous Q12H Lorretta Harp, MD   3 mL at 10/10/21  2236   sodium chloride flush (NS) 0.9 % injection 3 mL  3 mL Intravenous PRN Lorretta Harp, MD       vitamin B-12 (CYANOCOBALAMIN) tablet 1,000 mcg  1,000 mcg Oral Daily Almyra Deforest, PA   1,000 mcg at 10/11/21 1004             Medications Prior to Admission  Medication Sig Dispense Refill Last Dose   acetaminophen (TYLENOL) 500 MG tablet Take 1,000 mg by mouth every 6 (six) hours as needed for moderate pain or headache.     Past Month   amLODipine (NORVASC) 2.5 MG tablet TAKE 1 TABLET EVERY DAY (Patient taking differently: Take 2.5 mg by mouth at bedtime.) 90 tablet 2 10/08/2021   aspirin EC 81 MG tablet Take 81 mg by mouth at bedtime.     10/08/2021   atorvastatin (LIPITOR) 40 MG tablet Take 40 mg by mouth at bedtime.     10/08/2021   Biotin w/ Vitamins C & E (HAIR/SKIN/NAILS PO) Take 1 capsule by mouth daily.     10/09/2021   Calcium Carb-Cholecalciferol (CALCIUM 600 + D PO) Take 1 tablet by mouth daily.     10/09/2021   celecoxib (CELEBREX) 200 MG capsule Take 200 mg by mouth daily.     10/09/2021   cholecalciferol (VITAMIN D) 25 MCG (1000 UNIT) tablet Take 1,000 Units by mouth daily.     10/09/2021   fexofenadine (ALLEGRA) 180 MG tablet Take 180 mg by mouth daily as needed for allergies.     Past Month   fluticasone (FLONASE) 50 MCG/ACT nasal spray Place into both nostrils as needed.      unk   fluticasone-salmeterol (ADVAIR HFA) 230-21 MCG/ACT inhaler Inhale 2 puffs into the lungs 2 (two) times daily. 1 each 12 10/09/2021   glucose blood test strip 1 each by Other route 4 (four) times daily -  before meals and at bedtime. Use as instructed         insulin NPH Human (NOVOLIN N) 100 UNIT/ML injection Inject 18 Units into the skin 2 (two) times daily before a meal.     10/09/2021   insulin regular (NOVOLIN R) 100 units/mL injection Inject 3-5 Units into the skin See admin instructions. Per sliding scale 3 times daily with meals     10/09/2021   loperamide (IMODIUM) 1 MG/5ML solution Take by  mouth as needed for diarrhea or loose stools.     unk   losartan (COZAAR) 50 MG tablet Take 1 tablet (50 mg total) by mouth daily. 90 tablet 3 10/09/2021   metoprolol succinate (TOPROL XL) 25 MG 24 hr tablet Take 1 tablet (25 mg total) by mouth daily. (Patient taking differently: Take 25 mg by mouth at bedtime.) 90 tablet 3 10/08/2021 at 2000   nitroGLYCERIN (NITROSTAT) 0.4 MG SL tablet Place 0.4 mg under the tongue every 5 (five) minutes as needed for chest pain.     unk   vitamin B-12 (CYANOCOBALAMIN) 1000 MCG tablet Take 1,000 mcg by mouth daily.     10/09/2021   Efinaconazole 10 % SOLN Apply 1 drop topically daily. (Patient not taking: Reported  on 10/09/2021) 4 mL 11 Not Taking           Family History  Problem Relation Age of Onset   Heart attack Mother     CAD Father          CABG   Diabetes Father     Stroke Father     Colon cancer Father          74s   Diabetes Brother     Diabetes Paternal Grandfather     Heart attack Paternal Grandfather     Breast cancer Maternal Aunt          58s      Review of Systems:    ROS             Cardiac Review of Systems: Y or  [ N   ]= no             Chest Pain [  Y-upon admission  ]   Resting SOB [N]         Exertional SOB  [  Y]               Pedal Edema [  N ]     Syncope  [  N]  Presyncope [  N ]             General Review of Systems: [Y] = yes [ N ]=no Constitional: nausea [ N ]; night sweats [ N ]; fever [ N ]; or chills [  N]                                                                           Eye : blurred vision [  ]; diplopia [   ]; vision changes [  ];  Amaurosis fugax[ N]; Resp: cough [ N ];  wheezing[ N ];  hemoptysis[ N ]; s GI:  vomiting[ N ];  melena[N  ];  hematochezia Aqua.Slicker  ];  GU: hematuria[ N ];                Skin: [place], swelling[ N ];,              Heme/Lymph:  anemia[ Y ];  Neuro: TIA[  N];  headaches[  ];  stroke[ N ];  vertigo[N  ];  seizures[  N];                           Endocrine: diabetes[ Y ];  thyroid  dysfunction[  N];            Last dental visit: November 2022 (she has several crowns, several teeth have been pulled                             Physical Exam: BP (!) 124/53    Pulse 99    Temp 99.2 F (37.3 C) (Axillary)    Resp 19    Ht 5' 1.25" (1.556 m)    Wt 70.7 kg    SpO2 96%    BMI 29.21 kg/m      General appearance: alert, cooperative, and no distress Head:  Normocephalic, without obvious abnormality, atraumatic Neck: no adenopathy, no JVD, and supple, symmetrical, trachea midline Resp: clear to auscultation bilaterally Cardio:  RRR, grade III/VI murmur heard best along sternal border and radiating to both carotid arteries GI:  Soft, mildly obese, non tender Extremities:  No LE edema. Palpable pulses. Feet warm Neurologic: Grossly normal   Diagnostic Studies & Laboratory data:     Recent Radiology Findings:   DG Chest 2 View   Result Date: 10/09/2021 CLINICAL DATA:  Chest pain, 1 week of shortness of breath. EXAM: CHEST - 2 VIEW COMPARISON:  Chest CT January 15, 2021 and chest radiograph November 22, 2020. FINDINGS: The heart size and mediastinal contours are within normal limits. Aortic atherosclerosis. Linear opacity in the right apex appears stable likely reflecting post radiation change. No new focal airspace consolidation. No visible pleural effusion or pneumothorax. No acute osseous abnormality. Cholecystectomy clips. IMPRESSION: 1. No active cardiopulmonary disease. 2. Linear opacity in the right apex appears stable likely reflecting post treatment scarring. Electronically Signed   By: Dahlia Bailiff M.D.   On: 10/09/2021 19:10    CARDIAC CATHETERIZATION   Result Date: 10/10/2021 Images from the original result were not included.   Ost RCA lesion is 75% stenosed.   RPAV lesion is 90% stenosed.   Ost LAD to Prox LAD lesion is 40% stenosed.   Previously placed Prox LAD to Mid LAD stent (unknown type) is  widely patent. [facility] is a 71 y.o. female  [id number] LOCATION:   FACILITY: [facility] PHYSICIAN: Quay Burow, M.D. 1951-06-07 DATE OF PROCEDURE:  10/10/2021 DATE OF DISCHARGE: CARDIAC CATHETERIZATION History obtained from chart review.  71 year old female with a history of hypertension, hyperlipidemia, diabetes and prior history of LAD stenting in 2013.  She does have mild to moderate aortic stenosis.  She has had several weeks of chest pain when lying down.  She had worse pain tonight and came to the emergency room where she had a millimeter of ST segment elevation in lead V1 and ST segment depression in the inferior lateral leads.  She was brought to the Cath Lab for urgent cath and potential intervention.    Ms. Kwolek  has a widely patent LAD stent and moderate segmental disease in the proximal LAD.  There is no other disease in the left system.  She does have a 75% true ostial RCA with mild damping of a 5 [place] catheter.  I reviewed her angiograms with Dr. De Nurse who agrees that there is no culprit lesion in the LAD territory.  In addition, her LVEDP was only 8.  We will treat her medically at this time, cycle her enzymes and heparinize her for the time being.  The radial sheath was removed and a TR band was placed on the right wrist to achieve patent hemostasis.  The patient left lab in stable condition. Quay Burow. MD, [facility] 10/10/2021 1:19 AM   Diagnostic Dominance: Right Intervention   DG CHEST PORT 1 VIEW   Result Date: 10/10/2021 CLINICAL DATA:  Hypoxia EXAM: PORTABLE CHEST 1 VIEW COMPARISON:  10/09/2021, CT chest 01/15/2021, chest x-ray 12/02/2020 FINDINGS: fibrosis at the right apex. No acute consolidation or pleural effusion. Mild diffuse bronchitic changes. Stable cardiomediastinal silhouette with aortic atherosclerosis. IMPRESSION: No active disease. Mild diffuse bronchitic changes with probable fibrosis at right apex Electronically Signed   By: Donavan Foil M.D.   On: 10/10/2021 23:50    ECHOCARDIOGRAM LIMITED   Result Date: 10/10/2021     ECHOCARDIOGRAM LIMITED REPORT  Patient Name:   [facility] Date of Exam: 10/10/2021 Medical Rec #:  [id number]      Height:       61.3 in Accession #:    [id number]     Weight:       155.9 lb Date of Birth:  Jan 07, 1951     BSA:          1.704 m Patient Age:    31 years       BP:           137/56 mmHg Patient Gender: F              HR:           83 bpm. Exam Location:  Inpatient Procedure: Limited Echo, 3D Echo, Cardiac Doppler, Color Doppler and Strain            Analysis Indications:    CAD Native Vessel I25.10  History:        Patient has prior history of Echocardiogram examinations, most                 recent 11/06/2020. CAD, Carotid Disease; Aortic Valve Disease and                 Mitral Valve Disease. Past history of breast cancer.  Sonographer:    Darlina Sicilian RDCS Referring Phys: [place]  1. Normal LV function; calcified aortic valve with severe AS (mean gradient 40 mmHg; DI 0.21); mac with mild MS (mean gradient 5 mmHg) and mild MR.  2. Left ventricular ejection fraction, by estimation, is 60 to 65%. The left ventricle has normal function. The left ventricle has no regional wall motion abnormalities. There is mild left ventricular hypertrophy of the basal-septal segment. Left ventricular diastolic parameters are consistent with Grade I diastolic dysfunction (impaired relaxation). Elevated left atrial pressure. The average left ventricular global longitudinal strain is -18.4 %. The global longitudinal strain is normal.  3. Right ventricular systolic function is normal. The right ventricular size is normal.  4. The mitral valve is normal in structure. Mild mitral valve regurgitation. Mild mitral stenosis. Moderate mitral annular calcification.  5. The aortic valve is calcified. Aortic valve regurgitation is mild. Severe aortic valve stenosis.  6. The inferior vena cava is normal in size with greater than 50% respiratory variability, suggesting right atrial pressure of 3 mmHg.  FINDINGS  Left Ventricle: Left ventricular ejection fraction, by estimation, is 60 to 65%. The left ventricle has normal function. The left ventricle has no regional wall motion abnormalities. The average left ventricular global longitudinal strain is -18.4 %. The global longitudinal strain is normal. The left ventricular internal cavity size was normal in size. There is mild left ventricular hypertrophy of the basal-septal segment. Left ventricular diastolic parameters are consistent with Grade I diastolic dysfunction (impaired relaxation). Elevated left atrial pressure. Right Ventricle: The right ventricular size is normal. Right ventricular systolic function is normal. Left Atrium: Left atrial size was normal in size. Right Atrium: Right atrial size was normal in size. Pericardium: There is no evidence of pericardial effusion. Mitral Valve: The mitral valve is normal in structure. Moderate mitral annular calcification. Mild mitral valve regurgitation. Mild mitral valve stenosis. MV peak gradient, 8.2 mmHg. The mean mitral valve gradient is 5.0 mmHg. Tricuspid Valve: The tricuspid valve is normal in structure. Tricuspid valve regurgitation is trivial. No evidence of tricuspid stenosis. Aortic Valve: The aortic valve is calcified. Aortic valve regurgitation is mild.  Severe aortic stenosis is present. Aortic valve mean gradient measures 40.0 mmHg. Aortic valve peak gradient measures 60.2 mmHg. Aortic valve area, by VTI measures 0.54 cm. Pulmonic Valve: The pulmonic valve was not well visualized. Pulmonic valve regurgitation is not visualized. No evidence of pulmonic stenosis. Aorta: The aortic root is normal in size and structure. Venous: The inferior vena cava is normal in size with greater than 50% respiratory variability, suggesting right atrial pressure of 3 mmHg. IAS/Shunts: No atrial level shunt detected by color flow Doppler. Additional Comments: Normal LV function; calcified aortic valve with severe AS  (mean gradient 40 mmHg; DI 0.21); mac with mild MS (mean gradient 5 mmHg) and mild MR. LEFT VENTRICLE PLAX 2D LVIDd:         4.25 cm   Diastology LVIDs:         3.60 cm   LV e' medial:    4.74 cm/s LV PW:         0.80 cm   LV E/e' medial:  27.3 LV IVS:        1.30 cm   LV e' lateral:   2.95 cm/s LVOT diam:     1.80 cm   LV E/e' lateral: 43.9 LV SV:         52 LV SV Index:   31        2D Longitudinal Strain LVOT Area:     2.54 cm  2D Strain GLS Avg:     -18.4 %                           3D Volume EF:                          3D EF:        60 %                          LV EDV:       116 ml                          LV ESV:       47 ml                          LV SV:        69 ml LEFT ATRIUM         Index LA diam:    4.40 cm 2.58 cm/m  AORTIC VALVE AV Area (Vmax):    0.61 cm AV Area (Vmean):   0.52 cm AV Area (VTI):     0.54 cm AV Vmax:           387.80 cm/s AV Vmean:          303.500 cm/s AV VTI:            0.968 m AV Peak Grad:      60.2 mmHg AV Mean Grad:      40.0 mmHg LVOT Vmax:         92.90 cm/s LVOT Vmean:        61.700 cm/s LVOT VTI:          0.205 m LVOT/AV VTI ratio: 0.21  AORTA Ao Asc diam: 2.50 cm MITRAL VALVE MV Area (PHT): 3.95 cm     SHUNTS MV Area VTI:  1.46 cm     Systemic VTI:  0.20 m MV Peak grad:  8.2 mmHg     Systemic Diam: 1.80 cm MV Mean grad:  5.0 mmHg MV Vmax:       1.43 m/s MV Vmean:      107.0 cm/s MV Decel Time: 192 msec MV E velocity: 129.50 cm/s MV A velocity: 142.00 cm/s MV E/A ratio:  0.91 Kirk Ruths MD Electronically signed by Kirk Ruths MD Signature Date/Time: 10/10/2021/12:33:40 PM    Final      I have independently reviewed the above radiologic studies and discussed with the patient    Recent Lab Findings:      Lab Results  Component Value Date    WBC 9.2 10/11/2021    HGB 8.9 (L) 10/11/2021    HCT 26.5 (L) 10/11/2021    PLT 159 10/11/2021    GLUCOSE 215 (H) 10/11/2021    CHOL 99 10/10/2021    TRIG 122 10/10/2021    HDL 39 (L) 10/10/2021    LDLCALC 36  10/10/2021    ALT 16 07/11/2021    AST 28 07/11/2021    NA 130 (L) 10/11/2021    K 4.1 10/11/2021    CL 98 10/11/2021    CREATININE 1.20 (H) 10/11/2021    BUN 30 (H) 10/11/2021    CO2 24 10/11/2021    TSH 3.484 11/24/2020    INR 1.1 10/10/2021    HGBA1C 7.8 (H) 10/09/2021    The patient is a 71 year old woman with a hx of CAD s/p LAD stenting in past, breast cancer s/p bilateral mod rad mastectomy and right chest radiation 8+ years ago who presents with a 2-3 wk hx of progressive exertional SOB and chest discomfort as well as orthopnea associated with chest pain radiating down both arms to the hands. She presented with unstable angina and HS troponins of 15>45>154>199. BNP 511. Cath shows high grade ostial RCA stenosis, 90% distal RPAV stenosis and what looks like it may be a significant proximal LAD stenosis just beyond the septal perforator. The previous LAD stent is patent.  There is also some stenosis of the  proximal Ramus.  Her echo shows severe AS with a mean gradient of 40 mm Hg. AVA 0.54 cm2. LVEF 60-65%. Mild MS with mean gradient 5, peak 8.2. She had a CT in 01/2021 for chronic SOB which showed some radiation changes in the right apex. The ascending aorta had significant calcification extending from the root up to the arch particularly medially and posteriorly.  This calcification is likely due to her radiation therapy which may also cause significant pericardial adhesions and render the internal mammary arteries unsuitable for use as bypass conduits.  Radiation may also impede wound healing.  I reviewed the case with Dr. Burt Knack and he felt that the coronary lesions may be suitable for PCI.  She subsequently underwent complex PCI of the LAD lesion on 10/21/2021. The RCA had a negative FFR evaluation and was not treated. She did well with that and now presents for TAVR.   The patient was counseled at length regarding treatment alternatives for management of severe symptomatic aortic stenosis.  The risks and benefits of surgical intervention has been discussed in detail. Long-term prognosis with medical therapy was discussed. Alternative approaches such as conventional surgical aortic valve replacement, transcatheter aortic valve replacement, and palliative medical therapy were compared and contrasted at length. This discussion was placed in the context of the patient's own specific clinical presentation and  past medical history. All of her questions have been addressed.   Following the decision to proceed with transcatheter aortic valve replacement, a discussion was held regarding what types of management strategies would be attempted intraoperatively in the event of life-threatening complications, including whether or not the patient would be considered a candidate for the use of cardiopulmonary bypass and/or conversion to open sternotomy for attempted surgical intervention. Due to her calcified aorta I don't think she would be a candidate for emergent median sternotomy except to treat tamponade from a wire perforation.The patient is aware of the fact that transient use of cardiopulmonary bypass may be necessary. The patient has been advised of a variety of complications that might develop including but not limited to risks of death, stroke, paravalvular leak, aortic dissection or other major vascular complications, aortic annulus rupture, device embolization, cardiac rupture or perforation, mitral regurgitation, acute myocardial infarction, arrhythmia, heart block or bradycardia requiring permanent pacemaker placement, congestive heart failure, respiratory failure, renal failure, pneumonia, infection, other late complications related to structural valve deterioration or migration, or other complications that might ultimately cause a temporary or permanent loss of functional independence or other long term morbidity. The patient provides full informed consent for the procedure as described and all  questions were answered.       Gaye Pollack, MD

## 2021-11-08 ENCOUNTER — Inpatient Hospital Stay (HOSPITAL_COMMUNITY): Payer: Medicare HMO | Admitting: Anesthesiology

## 2021-11-08 ENCOUNTER — Inpatient Hospital Stay (HOSPITAL_COMMUNITY)
Admission: RE | Admit: 2021-11-08 | Discharge: 2021-11-09 | DRG: 267 | Disposition: A | Discharge status: Home / Self Care | Payer: Medicare HMO | Attending: Cardiovascular Disease | Admitting: Cardiovascular Disease

## 2021-11-08 ENCOUNTER — Inpatient Hospital Stay (HOSPITAL_COMMUNITY)
Admission: RE | Admit: 2021-11-08 | Discharge: 2021-11-08 | Disposition: A | Discharge status: Home / Self Care | Payer: Medicare HMO | Source: Home / Self Care | Attending: Cardiovascular Disease | Admitting: Cardiovascular Disease

## 2021-11-08 ENCOUNTER — Encounter (HOSPITAL_COMMUNITY): Payer: Self-pay | Admitting: Cardiovascular Disease

## 2021-11-08 ENCOUNTER — Other Ambulatory Visit: Payer: Self-pay | Admitting: Physician Assistant

## 2021-11-08 ENCOUNTER — Inpatient Hospital Stay (HOSPITAL_COMMUNITY): Payer: Medicare HMO | Admitting: Physician Assistant

## 2021-11-08 ENCOUNTER — Encounter (HOSPITAL_COMMUNITY)
Admission: RE | Disposition: A | Discharge status: Home / Self Care | Payer: Medicare HMO | Source: Home / Self Care | Attending: Cardiovascular Disease

## 2021-11-08 DIAGNOSIS — Z79899 Other long term (current) drug therapy: Secondary | ICD-10-CM

## 2021-11-08 DIAGNOSIS — Z952 Presence of prosthetic heart valve: Secondary | ICD-10-CM

## 2021-11-08 DIAGNOSIS — Z87891 Personal history of nicotine dependence: Secondary | ICD-10-CM | POA: Diagnosis not present

## 2021-11-08 DIAGNOSIS — Z006 Encounter for examination for normal comparison and control in clinical research program: Secondary | ICD-10-CM

## 2021-11-08 DIAGNOSIS — Z955 Presence of coronary angioplasty implant and graft: Secondary | ICD-10-CM | POA: Diagnosis not present

## 2021-11-08 DIAGNOSIS — I350 Nonrheumatic aortic (valve) stenosis: Secondary | ICD-10-CM

## 2021-11-08 DIAGNOSIS — R011 Cardiac murmur, unspecified: Secondary | ICD-10-CM | POA: Diagnosis present

## 2021-11-08 DIAGNOSIS — I252 Old myocardial infarction: Secondary | ICD-10-CM

## 2021-11-08 DIAGNOSIS — E785 Hyperlipidemia, unspecified: Secondary | ICD-10-CM | POA: Diagnosis present

## 2021-11-08 DIAGNOSIS — Z87440 Personal history of urinary (tract) infections: Secondary | ICD-10-CM

## 2021-11-08 DIAGNOSIS — I5032 Chronic diastolic (congestive) heart failure: Secondary | ICD-10-CM | POA: Diagnosis present

## 2021-11-08 DIAGNOSIS — E109 Type 1 diabetes mellitus without complications: Secondary | ICD-10-CM | POA: Diagnosis present

## 2021-11-08 DIAGNOSIS — L659 Nonscarring hair loss, unspecified: Secondary | ICD-10-CM | POA: Diagnosis present

## 2021-11-08 DIAGNOSIS — I6523 Occlusion and stenosis of bilateral carotid arteries: Secondary | ICD-10-CM | POA: Diagnosis present

## 2021-11-08 DIAGNOSIS — K219 Gastro-esophageal reflux disease without esophagitis: Secondary | ICD-10-CM

## 2021-11-08 DIAGNOSIS — I25110 Atherosclerotic heart disease of native coronary artery with unstable angina pectoris: Secondary | ICD-10-CM | POA: Diagnosis present

## 2021-11-08 DIAGNOSIS — Z888 Allergy status to other drugs, medicaments and biological substances status: Secondary | ICD-10-CM

## 2021-11-08 DIAGNOSIS — Z7982 Long term (current) use of aspirin: Secondary | ICD-10-CM

## 2021-11-08 DIAGNOSIS — Z9013 Acquired absence of bilateral breasts and nipples: Secondary | ICD-10-CM | POA: Diagnosis not present

## 2021-11-08 DIAGNOSIS — Z803 Family history of malignant neoplasm of breast: Secondary | ICD-10-CM | POA: Diagnosis not present

## 2021-11-08 DIAGNOSIS — I2510 Atherosclerotic heart disease of native coronary artery without angina pectoris: Secondary | ICD-10-CM | POA: Diagnosis present

## 2021-11-08 DIAGNOSIS — Z833 Family history of diabetes mellitus: Secondary | ICD-10-CM | POA: Diagnosis not present

## 2021-11-08 DIAGNOSIS — Z853 Personal history of malignant neoplasm of breast: Secondary | ICD-10-CM

## 2021-11-08 DIAGNOSIS — Z8249 Family history of ischemic heart disease and other diseases of the circulatory system: Secondary | ICD-10-CM

## 2021-11-08 DIAGNOSIS — Z885 Allergy status to narcotic agent status: Secondary | ICD-10-CM

## 2021-11-08 DIAGNOSIS — Z794 Long term (current) use of insulin: Secondary | ICD-10-CM

## 2021-11-08 DIAGNOSIS — I340 Nonrheumatic mitral (valve) insufficiency: Secondary | ICD-10-CM | POA: Diagnosis present

## 2021-11-08 DIAGNOSIS — I700 Atherosclerosis of aorta: Secondary | ICD-10-CM | POA: Diagnosis present

## 2021-11-08 DIAGNOSIS — I110 Hypertensive heart disease with heart failure: Secondary | ICD-10-CM | POA: Diagnosis present

## 2021-11-08 DIAGNOSIS — Z9049 Acquired absence of other specified parts of digestive tract: Secondary | ICD-10-CM | POA: Diagnosis not present

## 2021-11-08 DIAGNOSIS — Z923 Personal history of irradiation: Secondary | ICD-10-CM

## 2021-11-08 DIAGNOSIS — Z91018 Allergy to other foods: Secondary | ICD-10-CM

## 2021-11-08 DIAGNOSIS — Z7951 Long term (current) use of inhaled steroids: Secondary | ICD-10-CM

## 2021-11-08 HISTORY — DX: Chronic diastolic (congestive) heart failure: I50.32

## 2021-11-08 HISTORY — PX: TRANSCATHETER AORTIC VALVE REPLACEMENT, TRANSFEMORAL: SHX6400

## 2021-11-08 HISTORY — PX: INTRAOPERATIVE TRANSTHORACIC ECHOCARDIOGRAM: SHX6523

## 2021-11-08 HISTORY — DX: Presence of prosthetic heart valve: Z95.2

## 2021-11-08 LAB — POCT I-STAT, CHEM 8
BUN: 27 mg/dL — ABNORMAL HIGH (ref 8–23)
BUN: 27 mg/dL — ABNORMAL HIGH (ref 8–23)
Calcium, Ion: 1.24 mmol/L (ref 1.15–1.40)
Calcium, Ion: 1.21 mmol/L (ref 1.15–1.40)
Chloride: 103 mmol/L (ref 98–111)
Chloride: 105 mmol/L (ref 98–111)
Creatinine, Ser: 1.3 mg/dL — ABNORMAL HIGH (ref 0.44–1.00)
Creatinine, Ser: 1.1 mg/dL — ABNORMAL HIGH (ref 0.44–1.00)
Glucose, Bld: 157 mg/dL — ABNORMAL HIGH (ref 70–99)
Glucose, Bld: 99 mg/dL (ref 70–99)
HCT: 29 % — ABNORMAL LOW (ref 36.0–46.0)
HCT: 29 % — ABNORMAL LOW (ref 36.0–46.0)
Hemoglobin: 9.9 g/dL — ABNORMAL LOW (ref 12.0–15.0)
Hemoglobin: 9.9 g/dL — ABNORMAL LOW (ref 12.0–15.0)
Potassium: 4.1 mmol/L (ref 3.5–5.1)
Potassium: 4 mmol/L (ref 3.5–5.1)
Sodium: 139 mmol/L (ref 135–145)
Sodium: 141 mmol/L (ref 135–145)
TCO2: 29 mmol/L (ref 22–32)
TCO2: 27 mmol/L (ref 22–32)

## 2021-11-08 LAB — GLUCOSE, CAPILLARY
Glucose-Capillary: 106 mg/dL — ABNORMAL HIGH (ref 70–99)
Glucose-Capillary: 105 mg/dL — ABNORMAL HIGH (ref 70–99)
Glucose-Capillary: 34 mg/dL — CL (ref 70–99)
Glucose-Capillary: 113 mg/dL — ABNORMAL HIGH (ref 70–99)
Glucose-Capillary: 52 mg/dL — ABNORMAL LOW (ref 70–99)
Glucose-Capillary: 323 mg/dL — ABNORMAL HIGH (ref 70–99)

## 2021-11-08 LAB — ECHOCARDIOGRAM LIMITED
AR max vel: 2.33 cm2
AV Area VTI: 2.41 cm2
AV Area mean vel: 2.73 cm2
AV Mean grad: 4 mmHg
AV Peak grad: 7.4 mmHg
Ao pk vel: 1.36 m/s

## 2021-11-08 LAB — POCT ACTIVATED CLOTTING TIME
Activated Clotting Time: 126 seconds
Activated Clotting Time: 120 seconds
Activated Clotting Time: 320 seconds

## 2021-11-08 LAB — ABO/RH: ABO/RH(D): O POS

## 2021-11-08 SURGERY — IMPLANTATION, AORTIC VALVE, TRANSCATHETER, FEMORAL APPROACH
Anesthesia: Monitor Anesthesia Care

## 2021-11-08 MED ORDER — CLOPIDOGREL BISULFATE 75 MG PO TABS
75.0000 mg | ORAL_TABLET | Freq: Every day | ORAL | Status: DC
  Administered 2021-11-09: 09:00:00 75 mg via ORAL
  Filled 2021-11-08: qty 1

## 2021-11-08 MED ORDER — ACETAMINOPHEN 500 MG PO TABS
ORAL_TABLET | ORAL | Status: AC
  Administered 2021-11-08: 07:00:00 1000 mg via ORAL
  Filled 2021-11-08: qty 2

## 2021-11-08 MED ORDER — SODIUM CHLORIDE 0.9% FLUSH
3.0000 mL | Freq: Two times a day (BID) | INTRAVENOUS | Status: DC
  Administered 2021-11-08 – 2021-11-09 (×2): 3 mL via INTRAVENOUS

## 2021-11-08 MED ORDER — LIDOCAINE-EPINEPHRINE 1 %-1:100000 IJ SOLN
INTRAMUSCULAR | Status: AC
  Filled 2021-11-08: qty 1

## 2021-11-08 MED ORDER — ASPIRIN EC 81 MG PO TBEC
81.0000 mg | DELAYED_RELEASE_TABLET | Freq: Every day | ORAL | Status: DC
  Administered 2021-11-08: 21:00:00 81 mg via ORAL
  Filled 2021-11-08: qty 1

## 2021-11-08 MED ORDER — ONDANSETRON HCL 4 MG/2ML IJ SOLN
INTRAMUSCULAR | Status: DC | PRN
  Administered 2021-11-08: 4 mg via INTRAVENOUS

## 2021-11-08 MED ORDER — CEFAZOLIN SODIUM-DEXTROSE 2-4 GM/100ML-% IV SOLN
2.0000 g | Freq: Three times a day (TID) | INTRAVENOUS | Status: AC
  Administered 2021-11-08 – 2021-11-09 (×2): 2 g via INTRAVENOUS
  Filled 2021-11-08 (×2): qty 100

## 2021-11-08 MED ORDER — PHENYLEPHRINE 40 MCG/ML (10ML) SYRINGE FOR IV PUSH (FOR BLOOD PRESSURE SUPPORT)
PREFILLED_SYRINGE | INTRAVENOUS | Status: DC | PRN
  Administered 2021-11-08: 80 ug via INTRAVENOUS
  Administered 2021-11-08: 40 ug via INTRAVENOUS

## 2021-11-08 MED ORDER — DOCUSATE SODIUM 100 MG PO CAPS
200.0000 mg | ORAL_CAPSULE | Freq: Every day | ORAL | Status: DC
  Administered 2021-11-09: 09:00:00 200 mg via ORAL
  Filled 2021-11-08: qty 2

## 2021-11-08 MED ORDER — HEPARIN SODIUM (PORCINE) 1000 UNIT/ML IJ SOLN
INTRAMUSCULAR | Status: AC
  Filled 2021-11-08: qty 30

## 2021-11-08 MED ORDER — PROPOFOL 10 MG/ML IV BOLUS
INTRAVENOUS | Status: DC | PRN
  Administered 2021-11-08: 30 mg via INTRAVENOUS
  Administered 2021-11-08: 10 mg via INTRAVENOUS
  Administered 2021-11-08 (×2): 20 mg via INTRAVENOUS

## 2021-11-08 MED ORDER — CHLORHEXIDINE GLUCONATE 4 % EX LIQD: 30.0000 mL | CUTANEOUS | Status: DC

## 2021-11-08 MED ORDER — TRAMADOL HCL 50 MG PO TABS: 50.0000 mg | ORAL_TABLET | Freq: Four times a day (QID) | ORAL | Status: DC | PRN

## 2021-11-08 MED ORDER — PROTAMINE SULFATE 10 MG/ML IV SOLN
INTRAVENOUS | Status: DC | PRN
  Administered 2021-11-08: 100 mg via INTRAVENOUS

## 2021-11-08 MED ORDER — MOMETASONE FURO-FORMOTEROL FUM 200-5 MCG/ACT IN AERO
2.0000 | INHALATION_SPRAY | Freq: Two times a day (BID) | RESPIRATORY_TRACT | Status: DC
  Filled 2021-11-08: qty 8.8

## 2021-11-08 MED ORDER — ACETAMINOPHEN 325 MG PO TABS
650.0000 mg | ORAL_TABLET | Freq: Four times a day (QID) | ORAL | Status: DC | PRN
  Administered 2021-11-08 – 2021-11-09 (×2): 650 mg via ORAL
  Filled 2021-11-08 (×2): qty 2

## 2021-11-08 MED ORDER — HEPARIN SODIUM (PORCINE) 1000 UNIT/ML IJ SOLN
INTRAMUSCULAR | Status: DC | PRN
  Administered 2021-11-08: 10000 [IU] via INTRAVENOUS

## 2021-11-08 MED ORDER — INSULIN ASPART 100 UNIT/ML IJ SOLN
0.0000 [IU] | Freq: Three times a day (TID) | INTRAMUSCULAR | Status: DC
  Administered 2021-11-09: 14:00:00 5 [IU] via SUBCUTANEOUS
  Administered 2021-11-09: 07:00:00 8 [IU] via SUBCUTANEOUS

## 2021-11-08 MED ORDER — SODIUM CHLORIDE 0.9 % IV SOLN: 250.0000 mL | INTRAVENOUS | Status: DC | PRN

## 2021-11-08 MED ORDER — IOHEXOL 350 MG/ML SOLN
INTRAVENOUS | Status: DC | PRN
  Administered 2021-11-08: 11:00:00 40 mL

## 2021-11-08 MED ORDER — CHLORHEXIDINE GLUCONATE 4 % EX LIQD: 60.0000 mL | Freq: Once | CUTANEOUS | Status: DC

## 2021-11-08 MED ORDER — SODIUM CHLORIDE 0.9 % IV SOLN: INTRAVENOUS | Status: DC

## 2021-11-08 MED ORDER — NITROGLYCERIN IN D5W 200-5 MCG/ML-% IV SOLN: 0.0000 ug/min | INTRAVENOUS | Status: DC

## 2021-11-08 MED ORDER — LIDOCAINE-EPINEPHRINE 1 %-1:100000 IJ SOLN
30.0000 mL | Freq: Once | INTRAMUSCULAR | Status: AC
  Administered 2021-11-08: 13:00:00 2 mL

## 2021-11-08 MED ORDER — LACTATED RINGERS IV SOLN: INTRAVENOUS | Status: DC | PRN

## 2021-11-08 MED ORDER — ONDANSETRON HCL 4 MG/2ML IJ SOLN: 4.0000 mg | Freq: Four times a day (QID) | INTRAMUSCULAR | Status: DC | PRN

## 2021-11-08 MED ORDER — ACETAMINOPHEN 650 MG RE SUPP: 650.0000 mg | Freq: Four times a day (QID) | RECTAL | Status: DC | PRN

## 2021-11-08 MED ORDER — LIDOCAINE HCL (PF) 1 % IJ SOLN
INTRAMUSCULAR | Status: DC | PRN
  Administered 2021-11-08 (×2): 12 mL

## 2021-11-08 MED ORDER — SODIUM CHLORIDE 0.9% FLUSH: 3.0000 mL | INTRAVENOUS | Status: DC | PRN

## 2021-11-08 MED ORDER — CHLORHEXIDINE GLUCONATE 0.12 % MT SOLN
OROMUCOSAL | Status: AC
  Administered 2021-11-08: 07:00:00 15 mL via OROMUCOSAL
  Filled 2021-11-08: qty 15

## 2021-11-08 MED ORDER — LACTATED RINGERS IV SOLN: INTRAVENOUS | Status: DC

## 2021-11-08 MED ORDER — HEPARIN (PORCINE) IN NACL 1000-0.9 UT/500ML-% IV SOLN
INTRAVENOUS | Status: DC | PRN
  Administered 2021-11-08 (×3): 500 mL

## 2021-11-08 MED ORDER — PROPOFOL 500 MG/50ML IV EMUL
INTRAVENOUS | Status: DC | PRN
Start: 2021-11-08 — End: 2021-11-08
  Administered 2021-11-08: 20 ug/kg/min via INTRAVENOUS

## 2021-11-08 MED ORDER — ACETAMINOPHEN 500 MG PO TABS: 1000.0000 mg | ORAL_TABLET | Freq: Once | ORAL | Status: AC

## 2021-11-08 MED ORDER — CHLORHEXIDINE GLUCONATE 0.12 % MT SOLN: 15.0000 mL | Freq: Once | OROMUCOSAL | Status: AC

## 2021-11-08 MED ORDER — SODIUM CHLORIDE 0.9 % IV SOLN: INTRAVENOUS | Status: AC

## 2021-11-08 MED ORDER — FENTANYL CITRATE (PF) 100 MCG/2ML IJ SOLN
INTRAMUSCULAR | Status: AC
  Filled 2021-11-08: qty 2

## 2021-11-08 MED ORDER — ATORVASTATIN CALCIUM 80 MG PO TABS
80.0000 mg | ORAL_TABLET | Freq: Every day | ORAL | Status: DC
Start: 2021-11-08 — End: 2021-11-09
  Administered 2021-11-08: 21:00:00 80 mg via ORAL
  Filled 2021-11-08: qty 1

## 2021-11-08 MED ORDER — FENTANYL CITRATE (PF) 100 MCG/2ML IJ SOLN
INTRAMUSCULAR | Status: DC | PRN
  Administered 2021-11-08: 50 ug via INTRAVENOUS

## 2021-11-08 MED ORDER — INSULIN ASPART 100 UNIT/ML IJ SOLN
4.0000 [IU] | Freq: Three times a day (TID) | INTRAMUSCULAR | Status: DC
  Administered 2021-11-09 (×2): 4 [IU] via SUBCUTANEOUS

## 2021-11-08 MED ORDER — INSULIN ASPART 100 UNIT/ML IJ SOLN
0.0000 [IU] | Freq: Every day | INTRAMUSCULAR | Status: DC
  Administered 2021-11-08: 23:00:00 4 [IU] via SUBCUTANEOUS

## 2021-11-08 MED ORDER — HEPARIN (PORCINE) IN NACL 1000-0.9 UT/500ML-% IV SOLN
INTRAVENOUS | Status: AC
  Filled 2021-11-08: qty 2000

## 2021-11-08 MED ORDER — LIDOCAINE HCL 1 % IJ SOLN
INTRAMUSCULAR | Status: AC
  Filled 2021-11-08: qty 40

## 2021-11-08 SURGICAL SUPPLY — 32 items
BAG SNAP BAND KOVER 36X36 (MISCELLANEOUS) ×12 IMPLANT
BLANKET WARM UNDERBOD FULL ACC (MISCELLANEOUS) ×3 IMPLANT
CABLE ADAPT PACING TEMP 12FT (ADAPTER) ×2 IMPLANT
CATH 23 ULTRA DELIVERY (CATHETERS) ×2 IMPLANT
CATH DIAG 6FR PIGTAIL ANGLED (CATHETERS) ×4 IMPLANT
CATH INFINITI 6F AL1 (CATHETERS) ×2 IMPLANT
CATH S G BIP PACING (CATHETERS) ×2 IMPLANT
CLOSURE MYNX CONTROL 6F/7F (Vascular Products) ×2 IMPLANT
CLOSURE PERCLOSE PROSTYLE (VASCULAR PRODUCTS) ×4 IMPLANT
COVER SURGICAL LIGHT HANDLE (MISCELLANEOUS) ×2 IMPLANT
CRIMPER (MISCELLANEOUS) ×2 IMPLANT
DEVICE INFLATION ATRION QL2530 (MISCELLANEOUS) ×2 IMPLANT
ELECT DEFIB PAD ADLT CADENCE (PAD) ×2 IMPLANT
GUIDEWIRE SAFE TJ AMPLATZ EXST (WIRE) ×2 IMPLANT
KIT HEART LEFT (KITS) ×3 IMPLANT
KIT SAPIAN 3 ULTRA RESILIA 23 (Valve) ×2 IMPLANT
PACK CARDIAC CATHETERIZATION (CUSTOM PROCEDURE TRAY) ×3 IMPLANT
SHEATH BRITE TIP 7FR 35CM (SHEATH) ×2 IMPLANT
SHEATH INTRODUCER SET 20-26 (SHEATH) ×2 IMPLANT
SHEATH PINNACLE 6F 10CM (SHEATH) ×2 IMPLANT
SHEATH PINNACLE 8F 10CM (SHEATH) ×2 IMPLANT
SHEATH PROBE COVER 6X72 (BAG) ×2 IMPLANT
SHIELD RADPAD SCOOP 12X17 (MISCELLANEOUS) ×2 IMPLANT
SLEEVE REPOSITIONING LENGTH 30 (MISCELLANEOUS) ×2 IMPLANT
STOPCOCK MORSE 400PSI 3WAY (MISCELLANEOUS) ×6 IMPLANT
TRANSDUCER W/STOPCOCK (MISCELLANEOUS) ×6 IMPLANT
TUBING CONTRAST HIGH PRESS 48 (TUBING) ×2 IMPLANT
WIRE AMPLATZ SS-J .035X180CM (WIRE) ×2 IMPLANT
WIRE EMERALD 3MM-J .035X150CM (WIRE) ×2 IMPLANT
WIRE EMERALD 3MM-J .035X260CM (WIRE) ×2 IMPLANT
WIRE EMERALD ST .035X260CM (WIRE) ×2 IMPLANT
WIRE MICRO SET SILHO 5FR 7 (SHEATH) ×2 IMPLANT

## 2021-11-08 NOTE — Progress Notes (Signed)
Katie PA in to see patient

## 2021-11-08 NOTE — Discharge Instructions (Signed)

## 2021-11-08 NOTE — Progress Notes (Signed)
Mobility Specialist Progress Note   11/08/21 1700  Mobility  Activity Ambulated with assistance in hallway  Level of Assistance Standby assist, set-up cues, supervision of patient - no hands on  Assistive Device None  Distance Ambulated (ft) 450 ft  Activity Response Tolerated well  $Mobility charge 1 Mobility   Received pt in bed w/ no complaints and agreeable. Asymptomatic throughout ambulation w/ dressing and incision looking well pre and post mobility. Returned back to chair w/ needs met and family in room.    Pre Mobility:  68 HR During Mobility: 98 HR Post Mobility: 87 HR  Tacey Heap Phone Number [phone]

## 2021-11-08 NOTE — Anesthesia Procedure Notes (Signed)
Procedure Name: MAC Date/Time: 11/08/2021 10:00 AM Performed by: Inda Coke, CRNA Pre-anesthesia Checklist: Patient identified, Emergency Drugs available, Suction available, Patient being monitored and Timeout performed Patient Re-evaluated:Patient Re-evaluated prior to induction Oxygen Delivery Method: Simple face mask Induction Type: IV induction Airway Equipment and Method: Oral airway Dental Injury: Teeth and Oropharynx as per pre-operative assessment

## 2021-11-08 NOTE — Op Note (Signed)
[facility]   MULTIDISCIPLINARY HEART VALVE TEAM   TAVR OPERATIVE NOTE   Date of Procedure:  11/08/2021  Preoperative Diagnosis: Severe Aortic Stenosis   Postoperative Diagnosis: Same   Procedure:   Transcatheter Aortic Valve Replacement - Percutaneous Right Transfemoral Approach  Edwards Sapien 3 Ultra ResiliaTHV (size 23 mm, model # 9755RSL, serial # [id number])   Co-Surgeons:  Gaye Pollack, MD and Sherren Mocha, MD     Anesthesiologist:  Raechel Ache, MD  Echocardiographer:  Osborne Oman, MD  Pre-operative Echo Findings: Severe aortic stenosis Normal left ventricular systolic function  Post-operative Echo Findings: No paravalvular leak Normal left ventricular systolic function   BRIEF CLINICAL NOTE AND INDICATIONS FOR SURGERY  The patient is a 71 year old woman with a hx of CAD s/p LAD stenting in past, breast cancer s/p bilateral mod rad mastectomy and right chest radiation 8+ years ago who presents with a 2-3 wk hx of progressive exertional SOB and chest discomfort as well as orthopnea associated with chest pain radiating down both arms to the hands. She presented with unstable angina and HS troponins of 15>45>154>199. BNP 511. Cath shows high grade ostial RCA stenosis, 90% distal RPAV stenosis and what looks like it may be a significant proximal LAD stenosis just beyond the septal perforator. The previous LAD stent is patent.  There is also some stenosis of the  proximal Ramus.  Her echo shows severe AS with a mean gradient of 40 mm Hg. AVA 0.54 cm2. LVEF 60-65%. Mild MS with mean gradient 5, peak 8.2. She had a CT in 01/2021 for chronic SOB which showed some radiation changes in the right apex. The ascending aorta had significant calcification extending from the root up to the arch particularly medially and posteriorly.  This calcification is likely due to her radiation therapy which may also cause significant pericardial adhesions and render the internal  mammary arteries unsuitable for use as bypass conduits.  Radiation may also impede wound healing.  I reviewed the case with Dr. Burt Knack and he felt that the coronary lesions may be suitable for PCI.  She subsequently underwent complex PCI of the LAD lesion on 10/21/2021. The RCA had a negative FFR evaluation and was not treated. She did well with that and now presents for TAVR.    The patient was counseled at length regarding treatment alternatives for management of severe symptomatic aortic stenosis. The risks and benefits of surgical intervention has been discussed in detail. Long-term prognosis with medical therapy was discussed. Alternative approaches such as conventional surgical aortic valve replacement, transcatheter aortic valve replacement, and palliative medical therapy were compared and contrasted at length. This discussion was placed in the context of the patient's own specific clinical presentation and past medical history. All of her questions have been addressed.    Following the decision to proceed with transcatheter aortic valve replacement, a discussion was held regarding what types of management strategies would be attempted intraoperatively in the event of life-threatening complications, including whether or not the patient would be considered a candidate for the use of cardiopulmonary bypass and/or conversion to open sternotomy for attempted surgical intervention. Due to her calcified aorta I don't think she would be a candidate for emergent median sternotomy except to treat tamponade from a wire perforation.The patient is aware of the fact that transient use of cardiopulmonary bypass may be necessary. The patient has been advised of a variety of complications that might develop including but not limited to risks of death,  stroke, paravalvular leak, aortic dissection or other major vascular complications, aortic annulus rupture, device embolization, cardiac rupture or perforation, mitral  regurgitation, acute myocardial infarction, arrhythmia, heart block or bradycardia requiring permanent pacemaker placement, congestive heart failure, respiratory failure, renal failure, pneumonia, infection, other late complications related to structural valve deterioration or migration, or other complications that might ultimately cause a temporary or permanent loss of functional independence or other long term morbidity. The patient provides full informed consent for the procedure as described and all questions were answered.      DETAILS OF THE OPERATIVE PROCEDURE  PREPARATION:    The patient was brought to the operating room on the above mentioned date and appropriate monitoring was established by the anesthesia team. The patient was placed in the supine position on the operating table.  Intravenous antibiotics were administered. The patient was monitored closely throughout the procedure under conscious sedation.    Baseline transthoracic echocardiogram was performed. The patient's abdomen and both groins were prepped and draped in a sterile manner. A time out procedure was performed.   PERIPHERAL ACCESS:    Using the modified Seldinger technique, femoral arterial and venous access was obtained with placement of 6 Fr sheaths on the left side.  A pigtail diagnostic catheter was passed through the left arterial sheath under fluoroscopic guidance into the aortic root.  A temporary transvenous pacemaker catheter was passed through the left femoral venous sheath under fluoroscopic guidance into the right ventricle.  The pacemaker was tested to ensure stable lead placement and pacemaker capture. Aortic root angiography was performed in order to determine the optimal angiographic angle for valve deployment.   TRANSFEMORAL ACCESS:   Percutaneous transfemoral access and sheath placement was performed using ultrasound guidance.  The right common femoral artery was cannulated using a micropuncture  needle and appropriate location was verified using hand injection angiogram.  A pair of Abbott Perclose percutaneous closure devices were placed and a 6 French sheath replaced into the femoral artery.  The patient was heparinized systemically and ACT verified > 250 seconds.    A 14 Fr transfemoral E-sheath was introduced into the right common femoral artery after progressively dilating over an Amplatz superstiff wire. An AL-1 catheter was used to direct a straight-tip exchange length wire across the native aortic valve into the left ventricle. This was exchanged out for a pigtail catheter and position was confirmed in the LV apex. Simultaneous LV and Ao pressures were recorded.  The pigtail catheter was exchanged for an Amplatz Extra-stiff wire in the LV apex.    BALLOON AORTIC VALVULOPLASTY:    TRANSCATHETER HEART VALVE DEPLOYMENT:   An Edwards Sapien 3 Ultra transcatheter heart valve (size 23 mm) was prepared and crimped per manufacturer's guidelines, and the proper orientation of the valve is confirmed on the [employer] delivery system. The valve was advanced through the introducer sheath using normal technique until in an appropriate position in the abdominal aorta beyond the sheath tip. The balloon was then retracted and using the fine-tuning wheel was centered on the valve. The valve was then advanced across the aortic arch using appropriate flexion of the catheter. The valve was carefully positioned across the aortic valve annulus. The Commander catheter was retracted using normal technique. Once final position of the valve has been confirmed by angiographic assessment, the valve is deployed while temporarily holding ventilation and during rapid ventricular pacing to maintain systolic blood pressure < 50 mmHg and pulse pressure < 10 mmHg. The balloon inflation is held for >  3 seconds after reaching full deployment volume. Once the balloon has fully deflated the balloon is retracted into the  ascending aorta and valve function is assessed using echocardiography. There is felt to be no paravalvular leak and no central aortic insufficiency.  The patient's hemodynamic recovery following valve deployment is good.  The deployment balloon and guidewire are both removed.    PROCEDURE COMPLETION:   The sheath was removed and femoral artery closure performed.  Protamine was administered once femoral arterial repair was complete. The temporary pacemaker, pigtail catheters and femoral sheaths were removed with manual pressure used for hemostasis.  A Mynx femoral closure device was utilized following removal of the diagnostic sheath in the left femoral artery.  The patient tolerated the procedure well and is transported to the cath lab recovery area in stable condition. There were no immediate intraoperative complications. All sponge instrument and needle counts are verified correct at completion of the operation.   No blood products were administered during the operation.  The patient received a total of 40 mL of intravenous contrast during the procedure.   Gaye Pollack, MD 11/08/2021

## 2021-11-08 NOTE — Anesthesia Postprocedure Evaluation (Signed)
Anesthesia Post Note  Patient: Ellagrace L Krinsky  Procedure(s) Performed: TRANSCATHETER AORTIC VALVE REPLACEMENT, TRANSFEMORAL INTRAOPERATIVE TRANSTHORACIC ECHOCARDIOGRAM     Patient location during evaluation: PACU Anesthesia Type: MAC Level of consciousness: awake and alert Pain management: pain level controlled Vital Signs Assessment: post-procedure vital signs reviewed and stable Respiratory status: spontaneous breathing, nonlabored ventilation, respiratory function stable and patient connected to nasal cannula oxygen Cardiovascular status: blood pressure returned to baseline and stable Postop Assessment: no apparent nausea or vomiting Anesthetic complications: no   No notable events documented.  Last Vitals:  Vitals:   11/08/21 1315 11/08/21 1320  BP: (!) 100/31 (!) 95/31  Pulse: 62 (!) 57  Resp: 15 11  Temp:    SpO2: 93% 96%    Last Pain:  Vitals:   11/08/21 0659  TempSrc: Oral                 Audry Pili

## 2021-11-08 NOTE — Transfer of Care (Signed)
Immediate Anesthesia Transfer of Care Note  Patient: Molly Hill  Procedure(s) Performed: TRANSCATHETER AORTIC VALVE REPLACEMENT, TRANSFEMORAL INTRAOPERATIVE TRANSTHORACIC ECHOCARDIOGRAM  Patient Location: PACU  Anesthesia Type:MAC  Level of Consciousness: awake, alert  and oriented  Airway & Oxygen Therapy: Patient Spontanous Breathing and Patient connected to nasal cannula oxygen  Post-op Assessment: Report given to RN and Post -op Vital signs reviewed and stable  Post vital signs: Reviewed and stable  Last Vitals:  Vitals Value Taken Time  BP 106/35 11/08/21 1137  Temp    Pulse 64 11/08/21 1140  Resp 14 11/08/21 1140  SpO2 99 % 11/08/21 1140  Vitals shown include unvalidated device data.  Last Pain:  Vitals:   11/08/21 0659  TempSrc: Oral         Complications: No notable events documented.

## 2021-11-08 NOTE — Anesthesia Procedure Notes (Signed)
Arterial Line Insertion Start/End1/24/2023 8:00 AM Performed by: Inda Coke, CRNA  Patient location: Pre-op. Preanesthetic checklist: patient identified, IV checked, site marked, risks and benefits discussed, surgical consent, monitors and equipment checked, pre-op evaluation, timeout performed and anesthesia consent Lidocaine 1% used for infiltration Left, radial was placed Catheter size: 20 G Hand hygiene performed , maximum sterile barriers used  and Seldinger technique used  Attempts: 1 Procedure performed without using ultrasound guided technique. Following insertion, dressing applied and Biopatch. Post procedure assessment: normal  Patient tolerated the procedure well with no immediate complications. Additional procedure comments: Placed by S. Buerk, SRNA.

## 2021-11-08 NOTE — Interval H&P Note (Signed)
History and Physical Interval Note:  11/08/2021 6:47 AM  Molly Hill  has presented today for surgery, with the diagnosis of Severe Aortic Stenosis.  The various methods of treatment have been discussed with the patient and family. After consideration of risks, benefits and other options for treatment, the patient has consented to  Procedure(s): TRANSCATHETER AORTIC VALVE REPLACEMENT, TRANSFEMORAL (N/A) INTRAOPERATIVE TRANSTHORACIC ECHOCARDIOGRAM (N/A) as a surgical intervention.  The patient's history has been reviewed, patient examined, no change in status, stable for surgery.  I have reviewed the patient's chart and labs.  Questions were answered to the patient's satisfaction.     Gaye Pollack

## 2021-11-08 NOTE — Progress Notes (Signed)
°  [facility] VALVE TEAM  Patient doing well s/p TAVR. He is hemodynamically stable. Groin sites stable. ECG with sinus but no LBBB but no high grade block. Diastolic pressure noted to be a little low in high 30s. No diastolic murmur. Arterial line discontinued and transferred to 4E.  Plan for early ambulation after bedrest completed and hopeful discharge over the next 24-48 hours.   Angelena Form PA-C  [facility]  Pager [phone]

## 2021-11-08 NOTE — Op Note (Signed)
[facility]   MULTIDISCIPLINARY HEART VALVE TEAM   TAVR OPERATIVE NOTE   Date of Procedure:  11/08/2021  Preoperative Diagnosis: Severe Aortic Stenosis   Postoperative Diagnosis: Same   Procedure:   Transcatheter Aortic Valve Replacement - Percutaneous Transfemoral Approach  Edwards Sapien 3 Resilia THV (size 23 mm, serial # [id number])   Co-Surgeons:  Gaye Pollack, MD and Sherren Mocha, MD  Anesthesiologist:  Renold Don, MD  Echocardiographer:  Rudean Haskell, MD  Pre-operative Echo Findings: Severe aortic stenosis Normal left ventricular systolic function  Post-operative Echo Findings: No paravalvular leak Normal/unchanged left ventricular systolic function  BRIEF CLINICAL NOTE AND INDICATIONS FOR SURGERY  71 year old woman with history of breast cancer status post radiation therapy and bilateral mastectomy, calcified ascending aorta, type 1 diabetes, coronary artery disease status post PCI, and bicuspid aortic valve disease with severe aortic stenosis and mild aortic insufficiency.  The patient is undergone multidisciplinary heart team review after presenting with acute congestive heart failure in December 2022.  She was felt to be a poor candidate for conventional surgical aortic valve replacement because of comorbid conditions and calcification of the aorta at high risk of aortic injury.  She underwent FFR guided PCI of the LAD.  Her residual coronary disease will be treated medically.  She presents today for TAVR via a transfemoral approach.  During the course of the patient's preoperative work up they have been evaluated comprehensively by a multidisciplinary team of specialists coordinated through the [facility] in the [facility].  They have been demonstrated to suffer from symptomatic severe aortic stenosis as noted above. The patient has been counseled extensively as to the relative risks and  benefits of all options for the treatment of severe aortic stenosis including long term medical therapy, conventional surgery for aortic valve replacement, and transcatheter aortic valve replacement.  The patient has been independently evaluated in formal cardiac surgical consultation by Dr Cyndia Bent, who deemed the patient appropriate for TAVR. Based upon review of all of the patient's preoperative diagnostic tests they are felt to be candidate for transcatheter aortic valve replacement using the transfemoral approach as an alternative to conventional surgery.    Following the decision to proceed with transcatheter aortic valve replacement, a discussion has been held regarding what types of management strategies would be attempted intraoperatively in the event of life-threatening complications, including whether or not the patient would be considered a candidate for the use of cardiopulmonary bypass and/or conversion to open sternotomy for attempted surgical intervention.  The patient has been advised of a variety of complications that might develop peculiar to this approach including but not limited to risks of death, stroke, paravalvular leak, aortic dissection or other major vascular complications, aortic annulus rupture, device embolization, cardiac rupture or perforation, acute myocardial infarction, arrhythmia, heart block or bradycardia requiring permanent pacemaker placement, congestive heart failure, respiratory failure, renal failure, pneumonia, infection, other late complications related to structural valve deterioration or migration, or other complications that might ultimately cause a temporary or permanent loss of functional independence or other long term morbidity.  The patient provides full informed consent for the procedure as described and all questions were answered preoperatively.  DETAILS OF THE OPERATIVE PROCEDURE  PREPARATION:   The patient is brought to the operating room on the above  mentioned date and central monitoring was established by the anesthesia team including placement of a radial arterial line. The patient is placed in the supine position on the  operating table.  Intravenous antibiotics are administered. The patient is monitored closely throughout the procedure under conscious sedation.  Baseline transthoracic echocardiogram is performed. The patient's chest, abdomen, both groins, and both lower extremities are prepared and draped in a sterile manner. A time out procedure is performed.   PERIPHERAL ACCESS:   Using ultrasound guidance, femoral arterial and venous access is obtained with placement of 6 Fr sheaths on the left side.  [place] images are digitally captured and stored in the patient's chart. A pigtail diagnostic catheter was passed through the femoral arterial sheath under fluoroscopic guidance into the aortic root.  A temporary transvenous pacemaker catheter was passed through the femoral venous sheath under fluoroscopic guidance into the right ventricle.  The pacemaker was tested to ensure stable lead placement and pacemaker capture. Aortic root angiography was performed in order to determine the optimal angiographic angle for valve deployment.  TRANSFEMORAL ACCESS:  A micropuncture technique is used to access the right femoral artery under fluoroscopic and ultrasound guidance.  Ultrasound images demonstrate mobile atheroma in the distal common femoral artery.  Caution is taken to access the artery above this.  2 Perclose devices are deployed at 10' and 2' positions to 'PreClose' the femoral artery. An 8 French sheath is placed and then an Amplatz Superstiff wire is advanced through the sheath. This is changed out for a 14 French transfemoral E-Sheath after progressively dilating over the Superstiff wire.  An AL-1 catheter was used to direct a straight-tip exchange length wire across the native aortic valve into the left ventricle. This was exchanged out for a pigtail  catheter and position was confirmed in the LV apex. Simultaneous LV and Ao pressures were recorded.  The pigtail catheter was exchanged for an Amplatz Extra-stiff wire in the LV apex.    BALLOON AORTIC VALVULOPLASTY:  Not performed  TRANSCATHETER HEART VALVE DEPLOYMENT:  An Edwards Sapien 3 transcatheter heart valve (size 23 mm) was prepared and crimped per manufacturer's guidelines, and the proper orientation of the valve is confirmed on the [employer] delivery system. The valve was advanced through the introducer sheath using normal technique until in an appropriate position in the abdominal aorta beyond the sheath tip. The balloon was then retracted and using the fine-tuning wheel was centered on the valve. The valve was then advanced across the aortic arch using appropriate flexion of the catheter. The valve was carefully positioned across the aortic valve annulus. The Commander catheter was retracted using normal technique. Once final position of the valve has been confirmed by angiographic assessment, the valve is deployed while temporarily holding ventilation and during rapid ventricular pacing to maintain systolic blood pressure < 50 mmHg and pulse pressure < 10 mmHg. The balloon inflation is held for >3 seconds after reaching full deployment volume. Once the balloon has fully deflated the balloon is retracted into the ascending aorta and valve function is assessed using echocardiography. The patient's hemodynamic recovery following valve deployment is good.  The deployment balloon and guidewire are both removed. Echo demostrated acceptable post-procedural gradients, stable mitral valve function, and no aortic insufficiency.    PROCEDURE COMPLETION:  The sheath was removed and femoral artery closure is performed using the 2 previously deployed Perclose devices.  Protamine is administered once femoral arterial repair was complete. The site is clear with no evidence of bleeding or hematoma  after the sutures are tightened. The temporary pacemaker and pigtail catheters are removed. Mynx closure  is used for contralateral femoral arterial hemostasis for the 6  Fr sheath.  The patient tolerated the procedure well and is transported to the recovery area in stable condition. There were no immediate intraoperative complications. All sponge instrument and needle counts are verified correct at completion of the operation.   The patient received a total of 40 mL of intravenous contrast during the procedure.   Sherren Mocha, MD 11/08/2021 2:12 PM

## 2021-11-09 ENCOUNTER — Inpatient Hospital Stay (HOSPITAL_COMMUNITY): Payer: Medicare HMO

## 2021-11-09 DIAGNOSIS — Z952 Presence of prosthetic heart valve: Secondary | ICD-10-CM | POA: Diagnosis not present

## 2021-11-09 LAB — ECHOCARDIOGRAM COMPLETE
AR max vel: 2.01 cm2
AV Area VTI: 1.88 cm2
AV Area mean vel: 2.52 cm2
AV Mean grad: 7 mmHg
AV Peak grad: 14.1 mmHg
Ao pk vel: 1.88 m/s
Area-P 1/2: 2.67 cm2
MV VTI: 1.86 cm2
S' Lateral: 2.1 cm
Weight: 2350.4 oz

## 2021-11-09 LAB — CBC
HCT: 33.2 % — ABNORMAL LOW (ref 36.0–46.0)
Hemoglobin: 10.6 g/dL — ABNORMAL LOW (ref 12.0–15.0)
MCH: 30.9 pg (ref 26.0–34.0)
MCHC: 31.9 g/dL (ref 30.0–36.0)
MCV: 96.8 fL (ref 80.0–100.0)
Platelets: 163 10*3/uL (ref 150–400)
RBC: 3.43 MIL/uL — ABNORMAL LOW (ref 3.87–5.11)
RDW: 13.9 % (ref 11.5–15.5)
WBC: 6.4 10*3/uL (ref 4.0–10.5)
nRBC: 0 % (ref 0.0–0.2)

## 2021-11-09 LAB — BASIC METABOLIC PANEL
Anion gap: 8 (ref 5–15)
BUN: 17 mg/dL (ref 8–23)
CO2: 26 mmol/L (ref 22–32)
Calcium: 8.8 mg/dL — ABNORMAL LOW (ref 8.9–10.3)
Chloride: 102 mmol/L (ref 98–111)
Creatinine, Ser: 1.35 mg/dL — ABNORMAL HIGH (ref 0.44–1.00)
GFR, Estimated: 42 mL/min — ABNORMAL LOW (ref >60)
Glucose, Bld: 277 mg/dL — ABNORMAL HIGH (ref 70–99)
Potassium: 4.4 mmol/L (ref 3.5–5.1)
Sodium: 136 mmol/L (ref 135–145)

## 2021-11-09 LAB — MAGNESIUM: Magnesium: 1.9 mg/dL (ref 1.7–2.4)

## 2021-11-09 LAB — GLUCOSE, CAPILLARY
Glucose-Capillary: 289 mg/dL — ABNORMAL HIGH (ref 70–99)
Glucose-Capillary: 243 mg/dL — ABNORMAL HIGH (ref 70–99)

## 2021-11-09 MED ORDER — METOPROLOL SUCCINATE ER 25 MG PO TB24
25.0000 mg | ORAL_TABLET | Freq: Every day | ORAL | Status: DC
  Administered 2021-11-09: 09:00:00 25 mg via ORAL
  Filled 2021-11-09: qty 1

## 2021-11-09 MED FILL — Heparin Sod (Porcine)-NaCl IV Soln 1000 Unit/500ML-0.9%: INTRAVENOUS | Qty: 500 | Status: AC

## 2021-11-09 NOTE — Progress Notes (Signed)
Echocardiogram 2D Echocardiogram has been performed.  Oneal Deputy Nia Nathaniel RDCS 11/09/2021, 9:51 AM

## 2021-11-09 NOTE — Discharge Summary (Addendum)
[facility] VALVE TEAM  Discharge Summary    Patient ID: Molly Hill MRN: [id number]; DOB: 1951-04-11  Admit date: 11/08/2021 Discharge date: 11/09/2021  Primary Care Provider: Ernestene Kiel, MD  Primary Cardiologist: Fransico Him, MD / Dr. Burt Knack & Dr. Cyndia Bent (TAVR)  Discharge Diagnoses    Principal Problem:   S/P TAVR (transcatheter aortic valve replacement) Active Problems:   Coronary arteriosclerosis in native artery   Severe aortic stenosis   History of breast cancer   GERD (gastroesophageal reflux disease)   DM (diabetes mellitus), type 1 (HCC)   Chronic diastolic (congestive) heart failure (HCC)   Allergies Allergies  Allergen Reactions   Codeine     Hyper with vomiting   Onion Diarrhea, Nausea And Vomiting and Other (See Comments)    syncope   Reglan [Metoclopramide]     "pulls muscle to side" "feel paralyzed"    Diagnostic Studies/Procedures    TAVR OPERATIVE NOTE     Date of Procedure:                11/08/2021   Preoperative Diagnosis:      Severe Aortic Stenosis    Postoperative Diagnosis:    Same    Procedure:        Transcatheter Aortic Valve Replacement - Percutaneous Right Transfemoral Approach             Edwards Sapien 3 Ultra ResiliaTHV (size 23 mm, model # 9755RSL, serial # [id number])              Co-Surgeons:                        Gaye Pollack, MD and Sherren Mocha, MD       Anesthesiologist:                  Raechel Ache, MD   Echocardiographer:              Osborne Oman, MD   Pre-operative Echo Findings: Severe aortic stenosis Normal left ventricular systolic function   Post-operative Echo Findings: No paravalvular leak Normal left ventricular systolic function _____________    Echo 11/09/21: completed but pending formal read at the time of discharge   History of Present Illness     Molly Hill is a 71 y.o. female with a history of breast CA s/p radiation to the right  and bilateral mastectomy, DMT1, HTN, HLD, tobacco abuse, anemia, recent admission for NSTEMI and acute CHF in the setting of E.Coli bacteremia and pyelonephritis (09/2021), CAD s/p remote PCI of LAD in 2013 and NSTEMI with staged DES to pLAD (10/21/21), bicuspid AoV disease with severe AS/mild AI who presented to [facility] on 11/08/21 for planned TAVR.   The patient had recently noticed decreased exercise tolerance, fatigue, DOE and chest pain. She was admitted 12/25-12/30/22 for chest pain and above symptoms. She was found to have an NSTEMI and acute CHF in the setting of E.Coli bacteremia and pyelonephritis.  Echo showed LVEF 60-65% with severe aortic stenosis with a mean gradient of 40 mm hg, AVA 0.61 and DVI of 0.21. Cardiac catheterization 12/26 revealed 75% ostial right coronary artery, 90% RP AV and 40% ostial to proximal LAD; left ventricular end-diastolic pressure 8 mmHg. She was evaluated for [facility] but felt to be too high risk given radiation changes in the right apex along with ascending aorta calcifications extending from the root to the arch felt to  be secondary to chest radiation. She underwent pre TAVR scans while admitted. Plan was for discharge home with ABx and to return for staged PCI and FFR followed by eventual TAVR.   Staged cath 10/21/21 showed negative FFR evaluation of the RCA, moderate nonobstructive lesion and severe flow-limiting diffuse calcific proximal LAD stenosis of 75%, strongly positive RFR of 0.75, treated successfully with high-pressure noncompliant balloon angioplasty and stenting using a 2.75 x 26 mm Onyx frontier DES optimized with IVUS imaging. She was discharged on aspirin and plavix.  She was seen back in the office and felt to be stable and set up for TAVR, 11/08/21.  Hospital Course     Consultants: none   Severe AS: s/p successful TAVR with a 23 mm Edwards Sapien 3 Ultra Resilia THV via the TF approach on 11/08/21. One CC was removed with deployment. Post operative  echo completed but pending formal read, but I personally reviewed and she has vigorous LV function, normal valve with mean gradient of 7 mm hg and no PVL. There is mild MR. Groin sites are stable. ECG with sinus (initial post op LBBB has resolved). There has been no evidence of high grade heart block. Continue Asprin and Plavix. Plan for discharge home today with close follow up in the outpatient setting.   Sinus tach: likely related to hold home Toprol. This has been resumed.   CAD: recently admitted for NSTEMI in the setting of bacteremia/pyelonephritis. Staged cath 10/21/21 showed negative FFR evaluation of the RCA, moderate nonobstructive lesion and severe flow-limiting diffuse calcific proximal LAD stenosis of 75%, strongly positive RFR of 0.75, treated successfully with high-pressure noncompliant balloon angioplasty and stenting using a 2.75 x 26 mm Onyx frontier DES optimized with IVUS imaging. She was discharged on aspirin and plavix.  Her chest pain and dyspnea have completely resolved since PCI.   T1DM:  treated with SSI while admitted. Resume home meds insulin regimen at discharge.   HTN: Bp well controlled. Resume home meds   Hx of breast cancer: in remission   Recent pyelonephritis with ecoi bacteremia: treated with antibiotics and completely resolved.    Chronic diastolic heart failure: She appears euvolemic.  Continue Lasix 40 mg daily.    Incidental findings: these were uncovered during the course of her TAVR work up and will be addressed as an outpatient.  Renal cysts: MRI showed small renal cortical cysts are present. Recommend follow-up abdominopelvic CT without and with contrast in 3-6 months. Pulmonary nodule: 3 mm right upper lobe pulmonary nodule, nonspecific, but statistically likely benign. No follow-up needed if patient is low-risk. Non-contrast chest CT can be considered in 12 months if patient is high-risk.   _____________  Discharge Vitals Blood pressure (!) 116/41,  pulse 69, temperature 98.4 F (36.9 C), temperature source Oral, resp. rate 18, weight 66.6 kg, SpO2 98 %.  Filed Weights   11/09/21 0400  Weight: 66.6 kg    GEN: Well nourished, well developed, in no acute distress HEENT: normal Neck: no JVD or masses Cardiac: RRR; no murmurs, rubs, or gallops,no edema  Respiratory:  clear to auscultation bilaterally, normal work of breathing GI: soft, nontender, nondistended, + BS MS: no deformity or atrophy Skin: warm and dry, no Nieto.  Groin sites clear without hematoma or ecchymosis  Neuro:  Alert and Oriented x 3, Strength and sensation are intact Psych: euthymic mood, full affect   Labs & Radiologic Studies    CBC Recent Labs    11/08/21 1156 11/09/21 0607  WBC  --  6.4  HGB 9.9* 10.6*  HCT 29.0* 33.2*  MCV  --  96.8  PLT  --  476   Basic Metabolic Panel Recent Labs    11/08/21 1156 11/09/21 0607  NA 141 136  K 4.0 4.4  CL 105 102  CO2  --  26  GLUCOSE 99 277*  BUN 27* 17  CREATININE 1.10* 1.35*  CALCIUM  --  8.8*  MG  --  1.9   Liver Function Tests No results for input(s): AST, ALT, ALKPHOS, BILITOT, PROT, ALBUMIN in the last 72 hours. No results for input(s): LIPASE, AMYLASE in the last 72 hours. Cardiac Enzymes No results for input(s): CKTOTAL, CKMB, CKMBINDEX, TROPONINI in the last 72 hours. BNP Invalid input(s): POCBNP D-Dimer No results for input(s): DDIMER in the last 72 hours. Hemoglobin A1C No results for input(s): HGBA1C in the last 72 hours. Fasting Lipid Panel No results for input(s): CHOL, HDL, LDLCALC, TRIG, CHOLHDL, LDLDIRECT in the last 72 hours. Thyroid Function Tests No results for input(s): TSH, T4TOTAL, T3FREE, THYROIDAB in the last 72 hours.  Invalid input(s): FREET3 _____________  DG Chest 2 View  Result Date: 11/04/2021 CLINICAL DATA:  Preoperative evaluation for transcatheter aortic valve replacement procedure. History of severe aortic stenosis, cardiac stenting, hypertension and  myocardial infarction. EXAM: CHEST - 2 VIEW COMPARISON:  X-ray chest 10/10/2021; CT angio chest 10/12/2021. FINDINGS: Subtle stable right upper lobe scarring. No focal consolidation. No pleural effusion or pneumothorax. Heart and mediastinal contours are unremarkable. Coronary artery atherosclerosis. No acute osseous abnormality. IMPRESSION: No active cardiopulmonary disease. Electronically Signed   By: Kathreen Devoid M.D.   On: 11/04/2021 13:35   MR ABDOMEN W WO CONTRAST  Result Date: 10/13/2021 CLINICAL DATA:  Indeterminate heterogeneously enhancing left renal masses on CTA. EXAM: MRI ABDOMEN WITHOUT AND WITH CONTRAST TECHNIQUE: Multiplanar multisequence MR imaging of the abdomen was performed both before and after the administration of intravenous contrast. CONTRAST:  33mL GADAVIST GADOBUTROL 1 MMOL/ML IV SOLN COMPARISON:  Abdominal CTA 10/12/2021. Chest CT 01/15/2021 and abdominopelvic CT 12/30/2009. FINDINGS: Lower chest: Mild atelectasis at the lung bases. No significant pleural effusion. Hepatobiliary: The liver is normal in signal without focal abnormality or abnormal enhancement. There is stable mild extrahepatic biliary dilatation status post cholecystectomy. No evidence of choledocholithiasis. Pancreas: Mild atrophy. No evidence of focal mass lesion, ductal dilatation or surrounding inflammation. Spleen: Normal in size without focal abnormality. Adrenals/Urinary Tract: Both adrenal glands appear normal. Corresponding with the areas questioned on recent CT is mild restricted diffusion in the upper pole of the left kidney measuring 2.7 cm on image 91/9 and in the posterior interpolar region, measuring 1.8 cm on image 96/9. However, no abnormal T2 signal or enhancement is seen in these areas. There are small renal cortical cysts bilaterally, largest in the interpolar region of the right kidney measuring 1.1 cm. No evidence of hydronephrosis or perinephric soft tissue stranding. Stomach/Bowel: Mild wall  thickening and mucosal hyperenhancement of the distal esophagus. The stomach and visualized small bowel appear unremarkable. There is moderate stool throughout the colon. Vascular/Lymphatic: There are no enlarged abdominal lymph nodes. Aortic and branch vessel atherosclerosis, better seen on CT. No evidence of large vessel occlusion or aneurysm. The portal, superior mesenteric, splenic and renal veins appear normal. Other: No ascites. Musculoskeletal: No acute or significant osseous findings. There is a hemangioma in the T9 vertebral body. IMPRESSION: 1. No focal mass lesion or abnormal enhancement in the left kidney to correspond with the recent findings on CT. There  is mild restricted diffusion in these areas, and focal pyelonephritis is the favored etiology. Small renal cortical cysts are present. Recommend follow-up abdominopelvic CT without and with contrast in 3-6 months. 2. Stable extrahepatic biliary dilatation status post cholecystectomy. 3. Mild wall thickening and mucosal hyperenhancement of the distal esophagus, suggesting reflux-mediated esophagitis. 4.  Aortic Atherosclerosis (ICD10-I70.0). Electronically Signed   By: Richardean Sale M.D.   On: 10/13/2021 18:04   CARDIAC CATHETERIZATION  Result Date: 10/21/2021   RPAV lesion is 90% stenosed.   Ost RCA lesion is 50% stenosed.   Ost LAD to Prox LAD lesion is 75% stenosed.   Non-stenotic Prox LAD to Mid LAD lesion was previously treated.   A drug-eluting stent was successfully placed using a STENT ONYX FRONTIER 2.5X26.   Post intervention, there is a 10% residual stenosis. 1.  Negative FFR evaluation of the RCA, moderate nonobstructive lesion 2.  Severe flow-limiting diffuse calcific proximal LAD stenosis of 75%, strongly positive RFR of 0.75, treated successfully with high-pressure noncompliant balloon angioplasty and stenting using a 2.75 x 26 mm Onyx frontier DES optimized with IVUS imaging. Overnight observation in the setting of severe aortic  stenosis and proximal LAD intervention. Home tomorrow am if no complications arise. DAPT with ASA and plavix at least 12 months. Staged TAVR after recovery from PCI.   CT CORONARY MORPH W/CTA COR W/SCORE W/CA W/CM &/OR WO/CM  Addendum Date: 10/12/2021   ADDENDUM REPORT: 10/12/2021 21:44 CLINICAL DATA:  Severe Aortic Stenosis. EXAM: Cardiac TAVR CT TECHNIQUE: A non-contrast, gated CT scan was obtained with axial slices of 3 mm through the heart for aortic valve calcium scoring. A 90 kV retrospective, gated, contrast cardiac scan was obtained. Gantry rotation speed was 250 msecs and collimation was 0.6 mm. Nitroglycerin was not given. The 3D data set was reconstructed in 5% intervals of the 0-95% of the R-R cycle. Systolic and diastolic phases were analyzed on a dedicated workstation using MPR, MIP, and VRT modes. The patient received 100 cc of contrast. FINDINGS: Image quality: Excellent. Noise artifact is: Limited. Valve Morphology: The aortic valve is tricuspid with severe diffuse calcifications. There is acquired fusion of the RCC/LCC. Aortic Valve Calcium score: 798 Aortic annular dimension: Phase assessed: 30% Annular area: 329 mm2 Annular perimeter: 66.0 mm Max diameter: 23.3 mm Min diameter: 18.8 mm Membranous septum length: 8.4 mm Annular and subannular calcification: Trace, layered single calcification under the [place]. Optimal coplanar projection: LAO 21 CRA 6 Coronary Artery Height above Annulus: Left Main: 13.5 mm Right Coronary: 16.7 mm Sinus of Valsalva Measurements: Non-coronary: 28.3 mm Right-coronary: 27.0 mm Left-coronary: 28.6 mm Sinus of Valsalva Height: Non-coronary: 17.6 mm Right-coronary: 21.2 mm Left-coronary: 19.9 mm Sinotubular Junction: 24.1 mm.  Mild calcifications. Ascending Thoracic Aorta: 25.9 mm.  Mild calcifications. Coronary Arteries: Normal coronary origin. Right dominance. The study was performed without use of NTG and is insufficient for plaque evaluation. Please refer to recent  cardiac catheterization for coronary assessment. Severe 3-vessel coronary calcifications noted. Cardiac Morphology: Right Atrium: Right atrial size is within normal limits. Right Ventricle: The right ventricular cavity is within normal limits. Left Atrium: Left atrial size is dilated with no left atrial appendage filling defect. Small PFO present. Left Ventricle: The ventricular cavity size is within normal limits. There are no stigmata of prior infarction. There is no abnormal filling defect. Normal left ventricular function, EF=71%. No regional wall motion abnormalities. Pulmonary arteries: Normal in size without proximal filling defect. Pulmonary veins: Normal pulmonary venous drainage. Pericardium: Normal thickness  with no significant effusion or calcium present. Mitral Valve: The mitral valve is degenerative with moderate to severe mitral annular calcification. Extra-cardiac findings: See attached radiology report for non-cardiac structures. IMPRESSION: 1. Tricuspid aortic valve with severe diffuse calcifications. There is acquired fusion of the RCC/LCC. 2. Small aortic valve annulus (329 mm2). Would consider a 26 mm Evolut Pro as sinus measurements are appropriate and good sinus heights noted. No significant annular or subannular calcifications. 3. Sufficient coronary to annulus distance. 4. Optimal Fluoroscopic Angle for Delivery: LAO 21 CRA 6 [place] T. Audie Box, MD Electronically Signed   By: Eleonore Chiquito M.D.   On: 10/12/2021 21:44   Result Date: 10/12/2021 EXAM: OVER-READ INTERPRETATION  CT CHEST The following report is an over-read performed by radiologist Dr. Vinnie Langton of [facility], [place] on 10/12/2021. This over-read does not include interpretation of cardiac or coronary anatomy or pathology. The coronary calcium score/coronary CTA interpretation by the cardiologist is attached. COMPARISON:  Chest CT 01/15/2021. FINDINGS: Extracardiac findings will be described separately under  dictation for contemporaneously obtained CTA chest, abdomen and pelvis. IMPRESSION: Please see separate dictation for contemporaneously obtained CTA chest, abdomen and pelvis dated 10/12/2021 for full description of relevant extracardiac findings. Electronically Signed: By: Vinnie Langton M.D. On: 10/12/2021 09:21   DG CHEST PORT 1 VIEW  Result Date: 10/10/2021 CLINICAL DATA:  Hypoxia EXAM: PORTABLE CHEST 1 VIEW COMPARISON:  10/09/2021, CT chest 01/15/2021, chest x-ray 12/02/2020 FINDINGS: fibrosis at the right apex. No acute consolidation or pleural effusion. Mild diffuse bronchitic changes. Stable cardiomediastinal silhouette with aortic atherosclerosis. IMPRESSION: No active disease. Mild diffuse bronchitic changes with probable fibrosis at right apex Electronically Signed   By: Donavan Foil M.D.   On: 10/10/2021 23:50   CT ANGIO CHEST AORTA W/CM & OR WO/CM  Result Date: 10/12/2021 CLINICAL DATA:  71 year old female with history of severe aortic stenosis. Preprocedural study prior to potential transcatheter aortic valve replacement (TAVR) procedure. EXAM: CT ANGIOGRAPHY CHEST, ABDOMEN AND PELVIS TECHNIQUE: Multidetector CT imaging through the chest, abdomen and pelvis was performed using the standard protocol during bolus administration of intravenous contrast. Multiplanar reconstructed images and MIPs were obtained and reviewed to evaluate the vascular anatomy. CONTRAST:  179mL OMNIPAQUE IOHEXOL 350 MG/ML SOLN COMPARISON:  Chest CT 01/15/2021. CT the abdomen and pelvis 12/30/2009. FINDINGS: CTA CHEST FINDINGS Cardiovascular: Heart size is mildly enlarged. There is no significant pericardial fluid, thickening or pericardial calcification. There is aortic atherosclerosis, as well as atherosclerosis of the great vessels of the mediastinum and the coronary arteries, including calcified atherosclerotic plaque in the left main, left anterior descending, left circumflex and right coronary arteries. Severe  thickening and calcification of the aortic valve. Calcifications of the mitral annulus. Mediastinum/Lymph Nodes: No pathologically enlarged mediastinal or hilar lymph nodes. Esophagus is unremarkable in appearance. No axillary lymphadenopathy. Lungs/Pleura: Chronic area of ground-glass attenuation, septal thickening and regional architectural distortion in the apex of the right upper lobe, similar to the prior study, likely some chronic fibrosis related to prior right axillary radiation therapy. 3 mm right upper lobe pulmonary nodule (axial image 44 of series 5). No acute consolidative airspace disease. Trace bilateral pleural effusions lying dependently (left-greater-than-right). Musculoskeletal/Soft Tissues: There are no aggressive appearing lytic or blastic lesions noted in the visualized portions of the skeleton. CTA ABDOMEN AND PELVIS FINDINGS Hepatobiliary: No suspicious cystic or solid hepatic lesions. No intra or extrahepatic biliary ductal dilatation. Status post cholecystectomy. Pancreas: No pancreatic mass. No pancreatic ductal dilatation. No pancreatic or peripancreatic fluid collections  or inflammatory changes. Spleen: Unremarkable. Adrenals/Urinary Tract: There are 2 heterogeneously enhancing lesions in the left kidney which are unusual in appearance, but concerning for potential neoplasm, largest of which is in the lateral aspect of the upper pole of the left kidney (axial image 122 of series 4) measuring 4.0 x 2.3 cm. The smaller lesion is in the posterolateral aspect of the interpolar region of the left kidney (axial image 132 of series 4) measuring 1.8 x 1.1 cm. 1.3 cm low-attenuation lesion in the lateral aspect of the interpolar region of the right kidney is compatible with a simple cyst. Bilateral adrenal glands are normal in appearance. No hydroureteronephrosis. Urinary bladder is nearly decompressed, but otherwise unremarkable in appearance. Stomach/Bowel: The appearance of the stomach is  normal. There is no pathologic dilatation of small bowel or colon. Numerous colonic diverticulae are noted, without surrounding inflammatory changes to suggest an acute diverticulitis at this time. Normal appendix. Vascular/Lymphatic: Aortic atherosclerosis, without evidence of aneurysm or dissection in the abdominal or pelvic vasculature. Vascular findings and measurements pertinent to potential TAVR procedure, as detailed below. No lymphadenopathy noted in the abdomen or pelvis. Reproductive: Status post hysterectomy.  Ovaries are atrophic. Other: No significant volume of ascites.  No pneumoperitoneum. Musculoskeletal: There are no aggressive appearing lytic or blastic lesions noted in the visualized portions of the skeleton. VASCULAR MEASUREMENTS PERTINENT TO TAVR: AORTA: Minimal Aortic Diameter-7 x 8 mm Severity of Aortic Calcification-severe RIGHT PELVIS: Right Common Iliac Artery - Minimal Diameter-7.4 x 8.5 mm Tortuosity-mild Calcification-mild Right External Iliac Artery - Minimal Diameter-6.5 x 6.8 mm Tortuosity-mild Calcification-minimal Right Common Femoral Artery - Minimal Diameter-6.7 x 5.8 mm Tortuosity-mild Calcification-mild LEFT PELVIS: Left Common Iliac Artery - Minimal Diameter-6.6 x 7.3 mm Tortuosity-mild Calcification-mild Left External Iliac Artery - Minimal Diameter-7.3 x 6.5 mm Tortuosity-mild Calcification-minimal Left Common Femoral Artery - Minimal Diameter-6.0 x 6.2 mm Tortuosity-mild Calcification-mild Review of the MIP images confirms the above findings. IMPRESSION: 1. Vascular findings and measurements pertinent to potential TAVR procedure, as detailed above. 2. Severe thickening calcification of the aortic valve, compatible with reported clinical history of severe aortic stenosis. 3. 2 unusual appearing heterogeneously enhancing lesions in the left kidney, concerning for neoplasm. Further evaluation with nonemergent abdominal MRI with and without IV gadolinium is strongly recommended  in the near future to provide more definitive characterization. 4. 3 mm right upper lobe pulmonary nodule, nonspecific, but statistically likely benign. No follow-up needed if patient is low-risk. Non-contrast chest CT can be considered in 12 months if patient is high-risk. This recommendation follows the consensus statement: Guidelines for Management of Incidental Pulmonary Nodules Detected on CT Images: From the Fleischner Society 2017; Radiology 2017; 284:228-243. 5. Trace bilateral pleural effusions (left-greater-than-right). 6. Colonic diverticulosis without evidence of acute diverticulitis at this time. Electronically Signed   By: Vinnie Langton M.D.   On: 10/12/2021 13:04   VAS US DOPPLER PRE CABG  Result Date: 10/11/2021 PREOPERATIVE VASCULAR EVALUATION Patient Name:  Molly Hill  Date of Exam:   10/11/2021 Medical Rec #: [id number]       Accession #:    [id number] Date of Birth: 01-Oct-1951      Patient Gender: F Patient Age:   67 years Exam Location:  [facility] Procedure:      VAS US DOPPLER PRE CABG Referring Phys: Gilford Raid --------------------------------------------------------------------------------  Indications:      Pre-CABG. Risk Factors:     Hypertension, hyperlipidemia, Diabetes. Comparison Study: carotid duplex done 07/25/21 Performing Technologist: Archie Patten RVS  Examination Guidelines: A complete evaluation includes B-mode imaging, spectral Doppler, color Doppler, and power Doppler as needed of all accessible portions of each vessel. Bilateral testing is considered an integral part of a complete examination. Limited examinations for reoccurring indications may be performed as noted.  ABI Findings: +---------+------------------+-----+---------+--------+  Right     Rt Pressure (mmHg) Index Waveform  Comment   +---------+------------------+-----+---------+--------+  Brachial  130                      triphasic            +---------+------------------+-----+---------+--------+  PTA       212                1.63  triphasic           +---------+------------------+-----+---------+--------+  DP        255                1.96  triphasic           +---------+------------------+-----+---------+--------+  Great Toe 104                0.80  Normal              +---------+------------------+-----+---------+--------+ +---------+------------------+-----+---------+-------+  Left      Lt Pressure (mmHg) Index Waveform  Comment  +---------+------------------+-----+---------+-------+  Brachial  118                      triphasic          +---------+------------------+-----+---------+-------+  PTA       255                1.96  triphasic          +---------+------------------+-----+---------+-------+  DP        195                1.50  triphasic          +---------+------------------+-----+---------+-------+  Great Toe 112                0.86  Normal             +---------+------------------+-----+---------+-------+ +-------+---------------+----------------+  ABI/TBI Today's ABI/TBI Previous ABI/TBI  +-------+---------------+----------------+  Right   0.96            0.80              +-------+---------------+----------------+  Left    0.96            0.86              +-------+---------------+----------------+  Right Doppler Findings: +--------+--------+-----+---------+--------+  Site     Pressure Index Doppler   Comments  +--------+--------+-----+---------+--------+  Brachial 130            triphasic           +--------+--------+-----+---------+--------+  Radial                  triphasic           +--------+--------+-----+---------+--------+  Ulnar                   triphasic           +--------+--------+-----+---------+--------+  Left Doppler Findings: +--------+--------+-----+---------+--------+  Site     Pressure Index Doppler   Comments  +--------+--------+-----+---------+--------+  Brachial 118            triphasic            +--------+--------+-----+---------+--------+  Radial                  triphasic           +--------+--------+-----+---------+--------+  Ulnar                   triphasic           +--------+--------+-----+---------+--------+  Right ABI: Resting right ankle-brachial index indicates noncompressible right lower extremity arteries. The right toe-brachial index is normal. Left ABI: Resting left ankle-brachial index indicates noncompressible left lower extremity arteries. The left toe-brachial index is normal. Right Upper Extremity: Doppler waveforms remain within normal limits with right radial compression. Doppler waveforms decrease 50% with right ulnar compression. Left Upper Extremity: Doppler waveforms decrease 50% with left radial compression. Doppler waveforms remain within normal limits with left ulnar compression.  Electronically signed by Harold Barban MD on 10/11/2021 at 6:43:09 PM.    Final    ECHOCARDIOGRAM LIMITED  Result Date: 11/08/2021    ECHOCARDIOGRAM LIMITED REPORT   Patient Name:   Molly Hill Date of Exam: 11/08/2021 Medical Rec #:  [id number]      Height:       60.0 in Accession #:    [id number]     Weight:       146.8 lb Date of Birth:  [birth year]     BSA:          1.637 m Patient Age:    54 years       BP:           148/60 mmHg Patient Gender: F              HR:           78 bpm. Exam Location:  Inpatient Procedure: Limited Echo, Color Doppler and Cardiac Doppler Indications:     Aortic Stenosis i35.0  History:         Patient has prior history of Echocardiogram examinations, most                  recent 10/10/2021. Risk Factors:Hypertension, Diabetes and                  Dyslipidemia. Bicuspid Aortic Valve.  Sonographer:     Raquel Sarna Senior RDCS Referring Phys:  [place] Diagnosing Phys: Rudean Haskell MD  Sonographer Comments: 31mm Edwards S3U TAVR Implanted IMPRESSIONS  1. Interventional TTE.  2. Prior to procedure, the aortic valve was calcified- functionally bicuspid.  Aortic valve regurgitation was trivial, but the aortic valve had moderate to severe stenotis. Mean gradient 35 mmHg, Peak gradient 57 mm Hg, AVA 0.81 cm2. DVI 0.26.  3. After the procedure, there aortic valve was replaced with a 23 mm Edwards Sapien valve. No paravalular leak. Mean gradient 4 mm Hg, Peak gradient 7 mm Hg, EOA 2.42 cm2, DVI 0.82.  4. Left ventricular ejection fraction, by estimation, is 60 to 65%. The left ventricle has normal function. The left ventricle has no regional wall motion abnormalities. There is moderate concentric left ventricular hypertrophy. Left ventricular diastolic function could not be evaluated.  5. Right ventricular systolic function is normal. The right ventricular size is normal.  6. The mitral valve is grossly normal. Mild mitral valve regurgitation. Moderate mitral annular calcification. Comparison(s): Well seated prosthetic aortic valve. FINDINGS  Left Ventricle: Left ventricular ejection fraction, by estimation, is 60 to 65%. The left ventricle has normal function. The left ventricle has no regional wall motion abnormalities. The left ventricular  internal cavity size was normal in size. There is  moderate concentric left ventricular hypertrophy. Left ventricular diastolic function could not be evaluated. Right Ventricle: The right ventricular size is normal. No increase in right ventricular wall thickness. Right ventricular systolic function is normal. Mitral Valve: The mitral valve is grossly normal. Moderate mitral annular calcification. Mild mitral valve regurgitation. Aortic Valve: The aortic valve is calcified. Aortic valve regurgitation is trivial. Aortic valve mean gradient measures 4.0 mmHg. Aortic valve peak gradient measures 7.4 mmHg. Aortic valve area, by VTI measures 2.41 cm. LEFT VENTRICLE PLAX 2D LVOT diam:     1.90 cm LV SV:         68 LV SV Index:   42 LVOT Area:     2.84 cm  AORTIC VALVE AV Area (Vmax):    2.33 cm AV Area (Vmean):   2.73 cm AV Area  (VTI):     2.41 cm AV Vmax:           136.00 cm/s AV Vmean:          93.700 cm/s AV VTI:            0.283 m AV Peak Grad:      7.4 mmHg AV Mean Grad:      4.0 mmHg LVOT Vmax:         112.00 cm/s LVOT Vmean:        90.200 cm/s LVOT VTI:          0.241 m LVOT/AV VTI ratio: 0.85  SHUNTS Systemic VTI:  0.24 m Systemic Diam: 1.90 cm Rudean Haskell MD Electronically signed by Rudean Haskell MD Signature Date/Time: 11/08/2021/5:06:41 PM    Final    Structural Heart Procedure  Result Date: 11/08/2021 See surgical note for result.  CT Angio Abd/Pel w/ and/or w/o  Result Date: 10/12/2021 CLINICAL DATA:  71 year old female with history of severe aortic stenosis. Preprocedural study prior to potential transcatheter aortic valve replacement (TAVR) procedure. EXAM: CT ANGIOGRAPHY CHEST, ABDOMEN AND PELVIS TECHNIQUE: Multidetector CT imaging through the chest, abdomen and pelvis was performed using the standard protocol during bolus administration of intravenous contrast. Multiplanar reconstructed images and MIPs were obtained and reviewed to evaluate the vascular anatomy. CONTRAST:  160mL OMNIPAQUE IOHEXOL 350 MG/ML SOLN COMPARISON:  Chest CT 01/15/2021. CT the abdomen and pelvis 12/30/2009. FINDINGS: CTA CHEST FINDINGS Cardiovascular: Heart size is mildly enlarged. There is no significant pericardial fluid, thickening or pericardial calcification. There is aortic atherosclerosis, as well as atherosclerosis of the great vessels of the mediastinum and the coronary arteries, including calcified atherosclerotic plaque in the left main, left anterior descending, left circumflex and right coronary arteries. Severe thickening and calcification of the aortic valve. Calcifications of the mitral annulus. Mediastinum/Lymph Nodes: No pathologically enlarged mediastinal or hilar lymph nodes. Esophagus is unremarkable in appearance. No axillary lymphadenopathy. Lungs/Pleura: Chronic area of ground-glass attenuation,  septal thickening and regional architectural distortion in the apex of the right upper lobe, similar to the prior study, likely some chronic fibrosis related to prior right axillary radiation therapy. 3 mm right upper lobe pulmonary nodule (axial image 44 of series 5). No acute consolidative airspace disease. Trace bilateral pleural effusions lying dependently (left-greater-than-right). Musculoskeletal/Soft Tissues: There are no aggressive appearing lytic or blastic lesions noted in the visualized portions of the skeleton. CTA ABDOMEN AND PELVIS FINDINGS Hepatobiliary: No suspicious cystic or solid hepatic lesions. No intra or extrahepatic biliary ductal dilatation. Status post cholecystectomy. Pancreas: No pancreatic mass. No pancreatic ductal dilatation. No pancreatic  or peripancreatic fluid collections or inflammatory changes. Spleen: Unremarkable. Adrenals/Urinary Tract: There are 2 heterogeneously enhancing lesions in the left kidney which are unusual in appearance, but concerning for potential neoplasm, largest of which is in the lateral aspect of the upper pole of the left kidney (axial image 122 of series 4) measuring 4.0 x 2.3 cm. The smaller lesion is in the posterolateral aspect of the interpolar region of the left kidney (axial image 132 of series 4) measuring 1.8 x 1.1 cm. 1.3 cm low-attenuation lesion in the lateral aspect of the interpolar region of the right kidney is compatible with a simple cyst. Bilateral adrenal glands are normal in appearance. No hydroureteronephrosis. Urinary bladder is nearly decompressed, but otherwise unremarkable in appearance. Stomach/Bowel: The appearance of the stomach is normal. There is no pathologic dilatation of small bowel or colon. Numerous colonic diverticulae are noted, without surrounding inflammatory changes to suggest an acute diverticulitis at this time. Normal appendix. Vascular/Lymphatic: Aortic atherosclerosis, without evidence of aneurysm or dissection in  the abdominal or pelvic vasculature. Vascular findings and measurements pertinent to potential TAVR procedure, as detailed below. No lymphadenopathy noted in the abdomen or pelvis. Reproductive: Status post hysterectomy.  Ovaries are atrophic. Other: No significant volume of ascites.  No pneumoperitoneum. Musculoskeletal: There are no aggressive appearing lytic or blastic lesions noted in the visualized portions of the skeleton. VASCULAR MEASUREMENTS PERTINENT TO TAVR: AORTA: Minimal Aortic Diameter-7 x 8 mm Severity of Aortic Calcification-severe RIGHT PELVIS: Right Common Iliac Artery - Minimal Diameter-7.4 x 8.5 mm Tortuosity-mild Calcification-mild Right External Iliac Artery - Minimal Diameter-6.5 x 6.8 mm Tortuosity-mild Calcification-minimal Right Common Femoral Artery - Minimal Diameter-6.7 x 5.8 mm Tortuosity-mild Calcification-mild LEFT PELVIS: Left Common Iliac Artery - Minimal Diameter-6.6 x 7.3 mm Tortuosity-mild Calcification-mild Left External Iliac Artery - Minimal Diameter-7.3 x 6.5 mm Tortuosity-mild Calcification-minimal Left Common Femoral Artery - Minimal Diameter-6.0 x 6.2 mm Tortuosity-mild Calcification-mild Review of the MIP images confirms the above findings. IMPRESSION: 1. Vascular findings and measurements pertinent to potential TAVR procedure, as detailed above. 2. Severe thickening calcification of the aortic valve, compatible with reported clinical history of severe aortic stenosis. 3. 2 unusual appearing heterogeneously enhancing lesions in the left kidney, concerning for neoplasm. Further evaluation with nonemergent abdominal MRI with and without IV gadolinium is strongly recommended in the near future to provide more definitive characterization. 4. 3 mm right upper lobe pulmonary nodule, nonspecific, but statistically likely benign. No follow-up needed if patient is low-risk. Non-contrast chest CT can be considered in 12 months if patient is high-risk. This recommendation follows  the consensus statement: Guidelines for Management of Incidental Pulmonary Nodules Detected on CT Images: From the Fleischner Society 2017; Radiology 2017; 284:228-243. 5. Trace bilateral pleural effusions (left-greater-than-right). 6. Colonic diverticulosis without evidence of acute diverticulitis at this time. Electronically Signed   By: Vinnie Langton M.D.   On: 10/12/2021 13:04   Disposition   Pt is being discharged home today in good condition.  Follow-up Plans & Appointments     Follow-up Information     Eileen Stanford, PA-C. Go on 11/16/2021.   Specialties: Cardiology, Radiology Why: @ 3pm, please arrive at least 10 minutes early Contact information: [place] [address] [phone]                Discharge Instructions     Amb Referral to Cardiac Rehabilitation   Complete by: As directed    Diagnosis:  Valve Replacement Coronary Stents  Valve: Aortic Comment - TAVR   After initial evaluation and assessments completed: Virtual Based Care may be provided alone or in conjunction with Phase 2 Cardiac Rehab based on patient barriers.: Yes       Discharge Medications   Allergies as of 11/09/2021       Reactions   Codeine    Hyper with vomiting   Onion Diarrhea, Nausea And Vomiting, Other (See Comments)   syncope   Reglan [metoclopramide]    "pulls muscle to side" "feel paralyzed"        Medication List     TAKE these medications    acetaminophen 500 MG tablet Commonly known as: TYLENOL Take 1,000 mg by mouth every 6 (six) hours as needed for moderate pain or headache.   Advair HFA 230-21 MCG/ACT inhaler Generic drug: fluticasone-salmeterol Inhale 2 puffs into the lungs 2 (two) times daily.   aspirin EC 81 MG tablet Take 81 mg by mouth at bedtime.   atorvastatin 80 MG tablet Commonly known as: LIPITOR Take 1 tablet (80 mg total) by mouth at bedtime.   bisacodyl 5 MG EC tablet Generic drug:  bisacodyl Take 2 tablets (10 mg total) by mouth daily as needed for moderate constipation.   CALCIUM 600 + D PO Take 1 tablet by mouth daily.   cholecalciferol 25 MCG (1000 UNIT) tablet Commonly known as: VITAMIN D Take 1,000 Units by mouth daily.   clopidogrel 75 MG tablet Commonly known as: PLAVIX Take 1 tablet (75 mg total) by mouth daily.   docusate sodium 100 MG capsule Commonly known as: COLACE Take 2 capsules (200 mg total) by mouth daily.   EYE ALLERGY RELIEF OP Place 1 drop into both eyes daily as needed (allergies).   fexofenadine 180 MG tablet Commonly known as: ALLEGRA Take 180 mg by mouth daily as needed for allergies.   fluticasone 50 MCG/ACT nasal spray Commonly known as: FLONASE Place 1 spray into both nostrils as needed for allergies.   furosemide 40 MG tablet Commonly known as: LASIX Take 1 tablet (40 mg total) by mouth daily.   glucose blood test strip 1 each by Other route 4 (four) times daily -  before meals and at bedtime. Use as instructed   HAIR/SKIN/NAILS PO Take 1 capsule by mouth daily.   insulin NPH Human 100 UNIT/ML injection Commonly known as: NOVOLIN N Inject 18 Units into the skin 2 (two) times daily before a meal.   insulin regular 100 units/mL injection Commonly known as: NOVOLIN R Inject 4-9 Units into the skin 3 (three) times daily before meals.   loperamide 2 MG tablet Commonly known as: IMODIUM A-D Take 1-2 mg by mouth 4 (four) times daily as needed for diarrhea or loose stools.   metoprolol succinate 25 MG 24 hr tablet Commonly known as: Toprol XL Take 1 tablet (25 mg total) by mouth daily. What changed: when to take this   nitroGLYCERIN 0.4 MG SL tablet Commonly known as: NITROSTAT Place 1 tablet (0.4 mg total) under the tongue every 5 (five) minutes as needed for chest pain.   vitamin B-12 1000 MCG tablet Commonly known as: CYANOCOBALAMIN Take 1,000 mcg by mouth daily.          Outstanding Labs/Studies    none  Duration of Discharge Encounter   Greater than 30 minutes including physician time.  Mable Fill, PA-C 11/09/2021, 10:22 AM [phone]   Patient seen, examined. Available data reviewed. Agree with findings, assessment, and plan as outlined by  Nell Range, PA-C.  The patient is independently interviewed and examined.  She is alert, oriented, in no distress.  HEENT is normal, neck is normal with normal JVP, lungs are clear, heart is regular rate and rhythm with a soft ejection murmur at the right upper sternal border, abdomen is soft and nontender, extremities have no edema, bilateral groin sites are clear.  Telemetry shows normal sinus rhythm with no significant arrhythmia.  The patient's postoperative day #1 echo is reviewed and shows normal function of her transcatheter aortic valve, normal LV function, and no pericardial effusion.  There is no paravalvular regurgitation of the TAVR valve and the mean gradient is 7 mmHg.  Patient is clinically stable for discharge today.  Sherren Mocha, M.D. 11/09/2021 11:32 AM

## 2021-11-09 NOTE — Progress Notes (Signed)
CARDIAC REHAB PHASE I   PRE:  Rate/Rhythm: 94 SR    BP: sitting 163/54    SaO2: 100 RA  MODE:  Ambulation: 470 ft   POST:  Rate/Rhythm: 115 ST    BP: sitting 176/65     SaO2: 97 RA  Ambulated without c/o, no assist. HR and BP elevated.  Discussed restrictions, exercise, and CRPII. Pt very receptive. Reviewed NTG and Plavix. Will refer to [place] (previously referred to [facility] but [facility] will be more appropriate now). [place], ACSM 11/09/2021 8:45 AM

## 2021-11-09 NOTE — Progress Notes (Signed)
Pt to be discharged from 4E to home. D/c instructions and med education provided. Tele and IV removed.   Raelyn Number, RN

## 2021-11-10 ENCOUNTER — Telehealth: Payer: Self-pay | Admitting: Physician Assistant

## 2021-11-10 ENCOUNTER — Other Ambulatory Visit: Payer: Self-pay | Admitting: Physician Assistant

## 2021-11-10 MED ORDER — FUROSEMIDE 40 MG PO TABS: 40.0000 mg | ORAL_TABLET | Freq: Every day | ORAL | 3 refills | Status: DC

## 2021-11-10 NOTE — Telephone Encounter (Signed)
°*  STAT* If patient is at the pharmacy, call can be transferred to refill team.   1. Which medications need to be refilled? (please list name of each medication and dose if known) Furosemide  2. Which pharmacy/location (including street and city if local pharmacy) is medication to be sent to? [facility] [place], [place]  3. Do they need a 30 day or 90 day supply? 90 days

## 2021-11-10 NOTE — Telephone Encounter (Signed)
Molly Hill called stating [employer] faxed over FLMA forms to be completed by [place] for her on 10/31/21, if not completed by within 30 days from today they will deny her leave.  She wants to make sure they were received.

## 2021-11-10 NOTE — Telephone Encounter (Signed)
°  [place] VALVE TEAM   Patient contacted regarding discharge from [facility] on 1/25  Patient understands to follow up with a structural heart APP on 2/1 at [place].  Patient understands discharge instructions? yes Patient understands medications and regimen? yes Patient understands to bring all medications to this visit? yes  Angelena Form PA-C  [facility]

## 2021-11-16 ENCOUNTER — Ambulatory Visit: Payer: Medicare HMO | Admitting: Physician Assistant

## 2021-11-16 ENCOUNTER — Other Ambulatory Visit: Payer: Self-pay

## 2021-11-16 VITALS — BP 136/60 | HR 80 | Ht 60.0 in | Wt 147.0 lb

## 2021-11-16 DIAGNOSIS — I10 Essential (primary) hypertension: Secondary | ICD-10-CM

## 2021-11-16 DIAGNOSIS — I2510 Atherosclerotic heart disease of native coronary artery without angina pectoris: Secondary | ICD-10-CM

## 2021-11-16 DIAGNOSIS — Z9861 Coronary angioplasty status: Secondary | ICD-10-CM

## 2021-11-16 DIAGNOSIS — E108 Type 1 diabetes mellitus with unspecified complications: Secondary | ICD-10-CM

## 2021-11-16 DIAGNOSIS — Z952 Presence of prosthetic heart valve: Secondary | ICD-10-CM

## 2021-11-16 DIAGNOSIS — R911 Solitary pulmonary nodule: Secondary | ICD-10-CM

## 2021-11-16 DIAGNOSIS — N281 Cyst of kidney, acquired: Secondary | ICD-10-CM

## 2021-11-16 DIAGNOSIS — Z853 Personal history of malignant neoplasm of breast: Secondary | ICD-10-CM

## 2021-11-16 MED ORDER — AMOXICILLIN 500 MG PO CAPS: ORAL_CAPSULE | ORAL | 3 refills | Status: DC

## 2021-11-16 NOTE — Patient Instructions (Signed)
Medication Instructions:  1.Start amoxicillin 500 mg, take 4 capsules by mouth prior 1 hour to dental appointments *If you need a refill on your cardiac medications before your next appointment, please call your pharmacy*   Lab Work: BMET today If you have labs (blood work) drawn today and your tests are completely normal, you will receive your results only by: [place] (if you have MyChart) OR A paper copy in the mail If you have any lab test that is abnormal or we need to change your treatment, we will call you to review the results.  Follow-Up: At [facility], you and your health needs are our priority.  As part of our continuing mission to provide you with exceptional heart care, we have created designated Provider Care Teams.  These Care Teams include your primary Cardiologist (physician) and Advanced Practice Providers (APPs -  Physician Assistants and Nurse Practitioners) who all work together to provide you with the care you need, when you need it.   Your next appointment:   12/09/2021 at 10:30 AM  The format for your next appointment:   In Person  Provider:   Nell Range, PA-C{

## 2021-11-16 NOTE — Progress Notes (Signed)
[facility] [place]                                     Cardiology Office Note:    Date:  11/17/2021   ID:  Molly Hill, DOB 22-Nov-1950, MRN [id number]  PCP:  Ernestene Kiel, MD  [facility] Cardiologist:  Fransico Him, MD / Dr. Burt Knack & Dr. Cyndia Bent (TAVR) [place] Electrophysiologist:  None   Referring MD: Ernestene Kiel, MD   [facility] s/p TAVR  History of Present Illness:    Molly Hill is a 71 y.o. female with a hx of breast CA s/p radiation to the right and bilateral mastectomy, DMT1, HTN, HLD, tobacco abuse, anemia, recent admission for NSTEMI and acute CHF in the setting of E.Coli bacteremia and pyelonephritis (09/2021), CAD s/p remote PCI of LAD in 2013 and NSTEMI with staged DES to pLAD (10/21/21), bicuspid AoV disease with severe AS/mild AI s/p TAVR (11/08/21) who presents to clinic for follow up.   The patient had recently noticed decreased exercise tolerance, fatigue, DOE and chest pain. She was admitted 12/25-12/30/22 for chest pain and above symptoms. She was found to have an NSTEMI and acute CHF in the setting of E.Coli bacteremia and pyelonephritis.  Echo showed LVEF 60-65% with severe aortic stenosis with a mean gradient of 40 mm hg, AVA 0.61 and DVI of 0.21. Cardiac catheterization 12/26 revealed 75% ostial right coronary artery, 90% RP AV and 40% ostial to proximal LAD; left ventricular end-diastolic pressure 8 mmHg. She was evaluated for [facility] but felt to be too high risk given radiation changes in the right apex along with ascending aorta calcifications extending from the root to the arch felt to be secondary to chest radiation. She underwent pre TAVR scans while admitted. Plan was for discharge home with ABx and to return for staged PCI and FFR followed by eventual TAVR.   Staged cath 10/21/21 showed negative FFR evaluation of the RCA, moderate nonobstructive lesion and severe flow-limiting diffuse calcific  proximal LAD stenosis of 75%, strongly positive RFR of 0.75, treated successfully with high-pressure noncompliant balloon angioplasty and stenting using a 2.75 x 26 mm Onyx frontier DES optimized with IVUS imaging. She was discharged on aspirin and plavix.   She underwent successful TAVR with a 23 mm Edwards Sapien 3 Ultra Resilia THV via the TF approach on 11/08/21. One CC was removed with deployment. Post operative echo showed EF 65%, normally functioning TAVR with a mean gradient of 7 mmHg and no PVL and mild mitral stenosis. She was discharged on continued aspirin and Plavix.   Today the patient presents to clinic for follow up. No CP or SOB. No LE edema, orthopnea or PND. No dizziness or syncope. No blood in stool or urine. No palpitations.   Past Medical History:  Diagnosis Date   Alopecia    Anemia    Arthritis    Bicuspid aortic valve    mild to moderate AS by echo 2022   Chronic diastolic (congestive) heart failure (HCC)    Coronary arteriosclerosis in native artery    s/p PCI of LAD in 2013 & NSTEMI with staged DES to pLAD (10/2021)   DM (diabetes mellitus), type 1 (HCC)    GERD (gastroesophageal reflux disease)    History of breast cancer    Estrogen receptor negative. s/p chemo   Hyperglycemia    Hyperlipidemia  Hypertension    Mitral regurgitation    mild to moderate by echo 2020   S/P TAVR (transcatheter aortic valve replacement) 11/08/2021   s/p TAVR with a 23 mm Edwards S3UR via the TF approach by Dr. Burt Knack and Dr. Cyndia Bent   Tendinitis of left wrist     Past Surgical History:  Procedure Laterality Date   ANKLE SURGERY Bilateral    CATARACT EXTRACTION, BILATERAL     CHOLECYSTECTOMY     CORONARY STENT INTERVENTION     CORONARY STENT INTERVENTION N/A 10/21/2021   Procedure: CORONARY STENT INTERVENTION;  Surgeon: Sherren Mocha, MD;  Location: [place] [facility];  Service: Cardiovascular;  Laterality: N/A;   CORONARY/GRAFT ACUTE MI REVASCULARIZATION N/A 10/10/2021    Procedure: CORONARY/GRAFT ACUTE MI REVASCULARIZATION;  Surgeon: Lorretta Harp, MD;  Location: [place] [facility];  Service: Cardiovascular;  Laterality: N/A;   CTR     ELBOW SURGERY     HAND SURGERY     BOTH THUMBS AND MIDDLE FINGERS   INTRAOPERATIVE TRANSTHORACIC ECHOCARDIOGRAM N/A 11/08/2021   Procedure: INTRAOPERATIVE TRANSTHORACIC ECHOCARDIOGRAM;  Surgeon: Sherren Mocha, MD;  Location: [place] [facility];  Service: Open Heart Surgery;  Laterality: N/A;   INTRAVASCULAR PRESSURE WIRE/FFR STUDY N/A 10/21/2021   Procedure: INTRAVASCULAR PRESSURE WIRE/FFR STUDY;  Surgeon: Sherren Mocha, MD;  Location: [place] [facility];  Service: Cardiovascular;  Laterality: N/A;   LEFT BREAST MASTECTOMY     MASCULAR  REPAIR     PORT-A-CATH REMOVAL     PORTACATH PLACEMENT     RIGHT MODIFIED MASTECTOMY Right    TRANSCATHETER AORTIC VALVE REPLACEMENT, TRANSFEMORAL N/A 11/08/2021   Procedure: TRANSCATHETER AORTIC VALVE REPLACEMENT, TRANSFEMORAL;  Surgeon: Sherren Mocha, MD;  Location: [place] [facility];  Service: Open Heart Surgery;  Laterality: N/A;   UPPER LIP SURGERY      Current Medications: Current Meds  Medication Sig   acetaminophen (TYLENOL) 500 MG tablet Take 1,000 mg by mouth every 6 (six) hours as needed for moderate pain or headache.   amoxicillin (AMOXIL) 500 MG capsule Take 4 capsules by mouth 1 hour prior to dental appointments   aspirin EC 81 MG tablet Take 81 mg by mouth at bedtime.   atorvastatin (LIPITOR) 80 MG tablet Take 1 tablet (80 mg total) by mouth at bedtime.   Biotin w/ Vitamins C & E (HAIR/SKIN/NAILS PO) Take 1 capsule by mouth daily.   bisacodyl (DULCOLAX) 5 MG EC tablet Take 2 tablets (10 mg total) by mouth daily as needed for moderate constipation.   Calcium Carb-Cholecalciferol (CALCIUM 600 + D PO) Take 1 tablet by mouth daily.   cholecalciferol (VITAMIN D) 25 MCG (1000 UNIT) tablet Take 1,000 Units by mouth daily.   clopidogrel (PLAVIX) 75 MG tablet Take 1 tablet (75  mg total) by mouth daily.   docusate sodium (COLACE) 100 MG capsule Take 2 capsules (200 mg total) by mouth daily.   fexofenadine (ALLEGRA) 180 MG tablet Take 180 mg by mouth daily as needed for allergies.   fluticasone (FLONASE) 50 MCG/ACT nasal spray Place 1 spray into both nostrils as needed for allergies.   furosemide (LASIX) 40 MG tablet Take 1 tablet (40 mg total) by mouth daily.   glucose blood test strip 1 each by Other route 4 (four) times daily -  before meals and at bedtime. Use as instructed   insulin NPH Human (NOVOLIN N) 100 UNIT/ML injection Inject 18 Units into the skin 2 (two) times daily before a meal.  insulin regular (NOVOLIN R) 100 units/mL injection Inject 4-9 Units into the skin 3 (three) times daily before meals.   loperamide (IMODIUM A-D) 2 MG tablet Take 1-2 mg by mouth 4 (four) times daily as needed for diarrhea or loose stools.   metoprolol succinate (TOPROL XL) 25 MG 24 hr tablet Take 1 tablet (25 mg total) by mouth daily.   Naphazoline-Pheniramine (EYE ALLERGY RELIEF OP) Place 1 drop into both eyes daily as needed (allergies).   nitroGLYCERIN (NITROSTAT) 0.4 MG SL tablet Place 1 tablet (0.4 mg total) under the tongue every 5 (five) minutes as needed for chest pain.   vitamin B-12 (CYANOCOBALAMIN) 1000 MCG tablet Take 1,000 mcg by mouth daily.     Allergies:   Codeine, Onion, and Reglan [metoclopramide]   Social History   Socioeconomic History   Marital status: Widowed    Spouse name: Not on file   Number of children: Not on file   Years of education: Not on file   Highest education level: Not on file  Occupational History   Not on file  Tobacco Use   Smoking status: Former    Years: 3.00    Types: Cigarettes    Quit date: 08/15/1979    Years since quitting: 42.2   Smokeless tobacco: Never   Tobacco comments:    1 pack would last a week.  Vaping Use   Vaping Use: Never used  Substance and Sexual Activity   Alcohol use: Never   Drug use: Never    Sexual activity: Never  Other Topics Concern   Not on file  Social History Narrative   Not on file   Social Determinants of Health   Financial Resource Strain: Not on file  Food Insecurity: Not on file  Transportation Needs: Not on file  Physical Activity: Not on file  Stress: Not on file  Social Connections: Not on file     Family History: The patient's family history includes Breast cancer in her maternal aunt; CAD in her father; Colon cancer in her father; Diabetes in her brother, father, and paternal grandfather; Heart attack in her mother and paternal grandfather; Stroke in her father.  ROS:   Please see the history of present illness.    All other systems reviewed and are negative.  EKGs/Labs/Other Studies Reviewed:    The following studies were reviewed today:   TAVR OPERATIVE NOTE     Date of Procedure:                11/08/2021   Preoperative Diagnosis:      Severe Aortic Stenosis    Postoperative Diagnosis:    Same    Procedure:        Transcatheter Aortic Valve Replacement - Percutaneous Right Transfemoral Approach             Edwards Sapien 3 Ultra ResiliaTHV (size 23 mm, model # 9755RSL, serial # [id number])              Co-Surgeons:                        Gaye Pollack, MD and Sherren Mocha, MD       Anesthesiologist:                  Raechel Ache, MD   Echocardiographer:              Osborne Oman, MD   Pre-operative Echo Findings: Severe aortic stenosis Normal  left ventricular systolic function   Post-operative Echo Findings: No paravalvular leak Normal left ventricular systolic function _____________     Echo 11/09/21: IMPRESSIONS:  1. The aortic valve has been replaced with a 23 mm Sapien valve. Aortic  valve regurgitation is not visualized. Effective orifice area, by VTI  measures 1.88 cm. Aortic valve mean gradient measures 7.0 mmHg. Peak  gradient 14 mm Hg. DVI 0.64. Acceleration   time 81 ms.   2. Left ventricular ejection  fraction, by estimation, is 65 to 70%. The  left ventricle has normal function. The left ventricle has no regional  wall motion abnormalities. There is mild concentric left ventricular  hypertrophy. Left ventricular diastolic  parameters are indeterminate.   3. The mitral valve is abnormal. No evidence of mitral valve  regurgitation. Moderate mitral annular calcification. Mitral valve area by  continuity equation 1.79 cm2 consisent with mild mitral stenosis. Mean  gradient 6 at a heart rate of 86 bpm.   4. Right ventricular systolic function is normal. The right ventricular  size is normal. Tricuspid regurgitation signal is inadequate for assessing  PA pressure.   Comparison(s): No significant change from prior study. Stable prosthetic  function.   EKG:  EKG is  ordered today.  The ekg ordered today demonstrates sinus HR 80  Recent Labs: 11/24/2020: TSH 3.484 11/04/2021: ALT 18; B Natriuretic Peptide 103.7 11/09/2021: BUN 17; Creatinine, Ser 1.35; Hemoglobin 10.6; Magnesium 1.9; Platelets 163; Potassium 4.4; Sodium 136  Recent Lipid Panel    Component Value Date/Time   CHOL 99 10/10/2021 0605   CHOL 117 07/11/2021 1049   TRIG 122 10/10/2021 0605   HDL 39 (L) 10/10/2021 0605   HDL 56 07/11/2021 1049   CHOLHDL 2.5 10/10/2021 0605   VLDL 24 10/10/2021 0605   LDLCALC 36 10/10/2021 0605   LDLCALC 45 07/11/2021 1049     Risk Assessment/Calculations:       Physical Exam:    VS:  BP 136/60 (BP Location: Left Arm, Patient Position: Sitting, Cuff Size: Normal)    Pulse 80    Ht 5' (1.524 m)    Wt 147 lb (66.7 kg)    SpO2 99%    BMI 28.71 kg/m     Wt Readings from Last 3 Encounters:  11/16/21 147 lb (66.7 kg)  11/09/21 146 lb 14.4 oz (66.6 kg)  11/04/21 146 lb 12.8 oz (66.6 kg)     GEN:  Well nourished, well developed in no acute distress HEENT: Normal NECK: No JVD LYMPHATICS: No lymphadenopathy CARDIAC: RRR, no murmurs, rubs, gallops RESPIRATORY:  Clear to auscultation  without rales, wheezing or rhonchi  ABDOMEN: Soft, non-tender, non-distended MUSCULOSKELETAL:  No edema; No deformity  SKIN: Warm and dry.  Groin sites clear without hematoma or ecchymosis  NEUROLOGIC:  Alert and oriented x 3 PSYCHIATRIC:  Normal affect   ASSESSMENT:    1. S/P TAVR (transcatheter aortic valve replacement)   2. CAD S/P percutaneous coronary angioplasty   3. Type 1 diabetes mellitus with complications ([place])   4. Essential hypertension   5. History of breast cancer   6. Renal cyst   7. Pulmonary nodule    PLAN:    In order of problems listed above:  Severe AS s/p TAVR: doing great 1 week out from TAVR. ECG with no HAVB. Groin sites healing well. Continue on aspirin and plavix. SBE prophylaxis discussed; I have RX'd amoxicillin. I will see her back for 1 month follow up and echo.   CAD: admitted  09/2021 for NSTEMI in the setting of bacteremia/pyelonephritis. Staged cath 10/21/21 showed negative FFR evaluation of the RCA, moderate nonobstructive lesion and severe flow-limiting diffuse calcific proximal LAD stenosis of 75%, strongly positive RFR of 0.75, treated successfully with high-pressure noncompliant balloon angioplasty and stenting using a 2.75 x 26 mm Onyx frontier DES optimized with IVUS imaging. She was discharged on aspirin and plavix.  Her chest pain and dyspnea have completely resolved since PCI.   T1DM: continue insulin  HTN: Bp well controlled today. No changes made   Hx of breast cancer: in remission   Chronic diastolic heart failure: she appears euvolemic.  Continue Lasix 40 mg daily.  Check BMET   Renal cysts: MRI showed small renal cortical cysts are present. Recommend follow-up abdominopelvic CT without and with contrast in 3-6 months. This was not discussed today   Pulmonary nodule: 3 mm right upper lobe pulmonary nodule, nonspecific, but statistically likely benign. No follow-up needed if patient is low-risk. Non-contrast chest CT can be considered in  12 months if patient is high-risk. This was not discussed today      Cardiac Rehabilitation Eligibility Assessment  The patient is ready to start cardiac rehabilitation from a cardiac standpoint. Other    Medication Adjustments/Labs and Tests Ordered: Current medicines are reviewed at length with the patient today.  Concerns regarding medicines are outlined above.  Orders Placed This Encounter  Procedures   Basic metabolic panel   EKG [id number]   Meds ordered this encounter  Medications   amoxicillin (AMOXIL) 500 MG capsule    Sig: Take 4 capsules by mouth 1 hour prior to dental appointments    Dispense:  4 capsule    Refill:  3    Patient Instructions  Medication Instructions:  1.Start amoxicillin 500 mg, take 4 capsules by mouth prior 1 hour to dental appointments *If you need a refill on your cardiac medications before your next appointment, please call your pharmacy*   Lab Work: BMET today If you have labs (blood work) drawn today and your tests are completely normal, you will receive your results only by: [place] (if you have MyChart) OR A paper copy in the mail If you have any lab test that is abnormal or we need to change your treatment, we will call you to review the results.  Follow-Up: At [facility], you and your health needs are our priority.  As part of our continuing mission to provide you with exceptional heart care, we have created designated Provider Care Teams.  These Care Teams include your primary Cardiologist (physician) and Advanced Practice Providers (APPs -  Physician Assistants and Nurse Practitioners) who all work together to provide you with the care you need, when you need it.   Your next appointment:   12/09/2021 at 10:30 AM  The format for your next appointment:   In Person  Provider:   Nell Range, PA-C{   Signed, Angelena Form, PA-C  11/17/2021 3:07 AM    [place]

## 2021-11-17 LAB — BASIC METABOLIC PANEL
BUN/Creatinine Ratio: 26 (ref 12–28)
BUN: 37 mg/dL — ABNORMAL HIGH (ref 8–27)
CO2: 30 mmol/L — ABNORMAL HIGH (ref 20–29)
Calcium: 10.6 mg/dL — ABNORMAL HIGH (ref 8.7–10.3)
Chloride: 94 mmol/L — ABNORMAL LOW (ref 96–106)
Creatinine, Ser: 1.42 mg/dL — ABNORMAL HIGH (ref 0.57–1.00)
Glucose: 157 mg/dL — ABNORMAL HIGH (ref 70–99)
Potassium: 4.1 mmol/L (ref 3.5–5.2)
Sodium: 140 mmol/L (ref 134–144)
eGFR: 40 mL/min/{1.73_m2} — ABNORMAL LOW (ref >59)

## 2021-11-18 ENCOUNTER — Encounter: Payer: Self-pay | Admitting: *Deleted

## 2021-11-18 ENCOUNTER — Encounter: Payer: Medicare HMO | Attending: Cardiology | Admitting: *Deleted

## 2021-11-18 ENCOUNTER — Other Ambulatory Visit: Payer: Self-pay

## 2021-11-18 DIAGNOSIS — Z955 Presence of coronary angioplasty implant and graft: Secondary | ICD-10-CM | POA: Insufficient documentation

## 2021-11-18 DIAGNOSIS — Z952 Presence of prosthetic heart valve: Secondary | ICD-10-CM | POA: Insufficient documentation

## 2021-11-18 NOTE — Progress Notes (Signed)
Virtual orientation call completed today. shehas an appointment on Date: 12/05/2021  for EP eval and gym Orientation.  Documentation of diagnosis can be found in [facility] Date: 11/08/2021 .

## 2021-12-02 ENCOUNTER — Encounter (HOSPITAL_COMMUNITY): Payer: Self-pay | Admitting: Cardiovascular Disease

## 2021-12-05 ENCOUNTER — Encounter: Payer: Medicare HMO | Admitting: *Deleted

## 2021-12-05 ENCOUNTER — Other Ambulatory Visit: Payer: Self-pay

## 2021-12-05 VITALS — Ht 60.25 in | Wt 148.4 lb

## 2021-12-05 DIAGNOSIS — Z955 Presence of coronary angioplasty implant and graft: Secondary | ICD-10-CM | POA: Diagnosis not present

## 2021-12-05 DIAGNOSIS — Z952 Presence of prosthetic heart valve: Secondary | ICD-10-CM | POA: Diagnosis not present

## 2021-12-05 NOTE — Progress Notes (Signed)
Cardiac Individual Treatment Plan  Patient Details  Name: Deshea Pooley Wetherby MRN: [id number] Date of Birth: [birth year] Referring Provider:   Flowsheet Row Cardiac Rehab from 12/05/2021 in [facility] Cardiac and Pulmonary Rehab  Referring Provider Dr. Fransico Him       Initial Encounter Date:  Flowsheet Row Cardiac Rehab from 12/05/2021 in [facility] Cardiac and Pulmonary Rehab  Date 12/05/21       Visit Diagnosis: S/P TAVR (transcatheter aortic valve replacement)  Patient's Home Medications on Admission:  Current Outpatient Medications:    acetaminophen (TYLENOL) 500 MG tablet, Take 1,000 mg by mouth every 6 (six) hours as needed for moderate pain or headache., Disp: , Rfl:    amoxicillin (AMOXIL) 500 MG capsule, Take 4 capsules by mouth 1 hour prior to dental appointments, Disp: 4 capsule, Rfl: 3   aspirin EC 81 MG tablet, Take 81 mg by mouth at bedtime., Disp: , Rfl:    atorvastatin (LIPITOR) 80 MG tablet, Take 1 tablet (80 mg total) by mouth at bedtime., Disp: 90 tablet, Rfl: 3   BIOTIN PO, Take by mouth daily., Disp: , Rfl:    Biotin w/ Vitamins C & E (HAIR/SKIN/NAILS PO), Take 1 capsule by mouth daily. (Patient not taking: Reported on 11/18/2021), Disp: , Rfl:    bisacodyl (DULCOLAX) 5 MG EC tablet, Take 2 tablets (10 mg total) by mouth daily as needed for moderate constipation., Disp: 30 tablet, Rfl: 0   Calcium Carb-Cholecalciferol (CALCIUM 600 + D PO), Take 1 tablet by mouth daily., Disp: , Rfl:    cholecalciferol (VITAMIN D) 25 MCG (1000 UNIT) tablet, Take 1,000 Units by mouth daily., Disp: , Rfl:    clopidogrel (PLAVIX) 75 MG tablet, Take 1 tablet (75 mg total) by mouth daily., Disp: 90 tablet, Rfl: 3   docusate sodium (COLACE) 100 MG capsule, Take 2 capsules (200 mg total) by mouth daily., Disp: 10 capsule, Rfl: 0   fexofenadine (ALLEGRA) 180 MG tablet, Take 180 mg by mouth daily as needed for allergies., Disp: , Rfl:    fluticasone (FLONASE) 50 MCG/ACT nasal spray, Place 1 spray into  both nostrils as needed for allergies., Disp: , Rfl:    furosemide (LASIX) 40 MG tablet, Take 1 tablet (40 mg total) by mouth daily., Disp: 90 tablet, Rfl: 3   glucose blood test strip, 1 each by Other route 4 (four) times daily -  before meals and at bedtime. Use as instructed, Disp: , Rfl:    insulin NPH Human (NOVOLIN N) 100 UNIT/ML injection, Inject 18 Units into the skin 2 (two) times daily before a meal., Disp: , Rfl:    insulin regular (NOVOLIN R) 100 units/mL injection, Inject 4-9 Units into the skin 3 (three) times daily before meals., Disp: , Rfl:    loperamide (IMODIUM A-D) 2 MG tablet, Take 1-2 mg by mouth 4 (four) times daily as needed for diarrhea or loose stools., Disp: , Rfl:    metoprolol succinate (TOPROL XL) 25 MG 24 hr tablet, Take 1 tablet (25 mg total) by mouth daily., Disp: 90 tablet, Rfl: 3   Naphazoline-Pheniramine (EYE ALLERGY RELIEF OP), Place 1 drop into both eyes daily as needed (allergies)., Disp: , Rfl:    nitroGLYCERIN (NITROSTAT) 0.4 MG SL tablet, Place 1 tablet (0.4 mg total) under the tongue every 5 (five) minutes as needed for chest pain., Disp: 25 tablet, Rfl: 3   vitamin B-12 (CYANOCOBALAMIN) 1000 MCG tablet, Take 1,000 mcg by mouth daily., Disp: , Rfl:    vitamin C (  ASCORBIC ACID) 500 MG tablet, Take 500 mg by mouth daily., Disp: , Rfl:    Zinc 50 MG CAPS, Take 50 mg by mouth daily., Disp: , Rfl:   Past Medical History: Past Medical History:  Diagnosis Date   Alopecia    Anemia    Arthritis    Bicuspid aortic valve    mild to moderate AS by echo 2022   Chronic diastolic (congestive) heart failure (HCC)    Coronary arteriosclerosis in native artery    s/p PCI of LAD in 2013 & NSTEMI with staged DES to pLAD (10/2021)   DM (diabetes mellitus), type 1 (HCC)    GERD (gastroesophageal reflux disease)    History of breast cancer    Estrogen receptor negative. s/p chemo   Hyperglycemia    Hyperlipidemia    Hypertension    Mitral regurgitation    mild to  moderate by echo 2020   S/P TAVR (transcatheter aortic valve replacement) 11/08/2021   s/p TAVR with a 23 mm Edwards S3UR via the TF approach by Dr. Burt Knack and Dr. Cyndia Bent   Tendinitis of left wrist     Tobacco Use: Social History   Tobacco Use  Smoking Status Former   Years: 3.00   Types: Cigarettes   Quit date: 08/15/1979   Years since quitting: 42.3  Smokeless Tobacco Never  Tobacco Comments   1 pack would last a week.    Labs: Recent Review Flowsheet Data     Labs for ITP Cardiac and Pulmonary Rehab Latest Ref Rng & Units 10/10/2021 10/11/2021 11/04/2021 11/08/2021 11/08/2021   Cholestrol 0 - 200 mg/dL 99 - - - -   LDLCALC 0 - 99 mg/dL 36 - - - -   HDL >40 mg/dL 39(L) - - - -   Trlycerides <150 mg/dL 122 - - - -   Hemoglobin A1c 4.8 - 5.6 % - - - - -   PHART 7.350 - 7.450 - 7.448 7.419 - -   PCO2ART 32.0 - 48.0 mmHg - 37.7 44.9 - -   HCO3 20.0 - 28.0 mmol/L - 25.9 28.5(H) - -   TCO2 22 - 32 mmol/L - 27 - 29 27   O2SAT % - 93.0 98.2 - -        Exercise Target Goals: Exercise Program Goal: Individual exercise prescription set using results from initial 6 min walk test and THRR while considering  patients activity barriers and safety.   Exercise Prescription Goal: Initial exercise prescription builds to 30-45 minutes a day of aerobic activity, 2-3 days per week.  Home exercise guidelines will be given to patient during program as part of exercise prescription that the participant will acknowledge.   Education: Aerobic Exercise: - Group verbal and visual presentation on the components of exercise prescription. Introduces F.I.T.T principle from ACSM for exercise prescriptions.  Reviews F.I.T.T. principles of aerobic exercise including progression. Written material given at graduation.   Education: Resistance Exercise: - Group verbal and visual presentation on the components of exercise prescription. Introduces F.I.T.T principle from ACSM for exercise prescriptions   Reviews F.I.T.T. principles of resistance exercise including progression. Written material given at graduation.    Education: Exercise & Equipment Safety: - Individual verbal instruction and demonstration of equipment use and safety with use of the equipment. Flowsheet Row Cardiac Rehab from 12/05/2021 in [facility] Cardiac and Pulmonary Rehab  Date 12/05/21  Educator [facility]  Instruction Review Code 1- [employer] Understanding       Education: Exercise Physiology & General  Exercise Guidelines: - Group verbal and written instruction with models to review the exercise physiology of the cardiovascular system and associated critical values. Provides general exercise guidelines with specific guidelines to those with heart or lung disease.    Education: Flexibility, Balance, Mind/Body Relaxation: - Group verbal and visual presentation with interactive activity on the components of exercise prescription. Introduces F.I.T.T principle from ACSM for exercise prescriptions. Reviews F.I.T.T. principles of flexibility and balance exercise training including progression. Also discusses the mind body connection.  Reviews various relaxation techniques to help reduce and manage stress (i.e. Deep breathing, progressive muscle relaxation, and visualization). Balance handout provided to take home. Written material given at graduation.   Activity Barriers & Risk Stratification:  Activity Barriers & Cardiac Risk Stratification - 11/18/21 1011       Activity Barriers & Cardiac Risk Stratification   Activity Barriers Joint Problems   has broken both ankles in past,  arthritis in fingers   Cardiac Risk Stratification Moderate             6 Minute Walk:  6 Minute Walk     Row Name 12/05/21 1535         6 Minute Walk   Phase Initial     Distance 1040 feet     Walk Time 6 minutes     # of Rest Breaks 0     MPH 1.97     METS 2.41     RPE 7     Perceived Dyspnea  0     VO2 Peak 8.43     Symptoms No      Resting HR 69 bpm     Resting BP 132/82     Resting Oxygen Saturation  100 %     Exercise Oxygen Saturation  during 6 min walk 99 %     Max Ex. HR 105 bpm     Max Ex. BP 152/80     2 Minute Post BP 138/82              Oxygen Initial Assessment:   Oxygen Re-Evaluation:   Oxygen Discharge (Final Oxygen Re-Evaluation):   Initial Exercise Prescription:  Initial Exercise Prescription - 12/05/21 1500       Date of Initial Exercise RX and Referring Provider   Date 12/05/21    Referring Provider Dr. Fransico Him      Oxygen   Maintain Oxygen Saturation 88% or higher      Recumbant Bike   Level 2    RPM 60    Minutes 15    METs 2.41      NuStep   Level 3    SPM 80    Minutes 15    METs 2.41      Arm Ergometer   Level 1    RPM 30    Minutes 15    METs 2.41      REL-XR   Level 2    Speed 50    Minutes 15    METs 2.41      Biostep-RELP   Level 2    SPM 50    Minutes 15    METs 2.41      Track   Laps 20    Minutes 15    METs 2.09      Prescription Details   Frequency (times per week) 3    Duration Progress to 30 minutes of continuous aerobic without signs/symptoms of physical distress  Intensity   THRR 40-80% of Max Heartrate 101-133    Ratings of Perceived Exertion 11-13    Perceived Dyspnea 0-4      Progression   Progression Continue to progress workloads to maintain intensity without signs/symptoms of physical distress.      Resistance Training   Training Prescription Yes    Weight 5    Reps 10-15             Perform Capillary Blood Glucose checks as needed.  Exercise Prescription Changes:   Exercise Prescription Changes     Row Name 12/05/21 1500             Response to Exercise   Blood Pressure (Admit) 132/82       Blood Pressure (Exercise) 152/80       Blood Pressure (Exit) 120/82       Heart Rate (Admit) 69 bpm       Heart Rate (Exercise) 105 bpm       Heart Rate (Exit) 71 bpm       Oxygen Saturation (Admit)  100 %       Oxygen Saturation (Exercise) 99 %       Oxygen Saturation (Exit) 100 %       Rating of Perceived Exertion (Exercise) 7       Perceived Dyspnea (Exercise) 0       Symptoms none       Comments 6 MWT results                Exercise Comments:   Exercise Goals and Review:   Exercise Goals     Row Name 12/05/21 1546             Exercise Goals   Increase Physical Activity Yes       Intervention Provide advice, education, support and counseling about physical activity/exercise needs.;Develop an individualized exercise prescription for aerobic and resistive training based on initial evaluation findings, risk stratification, comorbidities and participant's personal goals.       Expected Outcomes Short Term: Attend rehab on a regular basis to increase amount of physical activity.;Long Term: Add in home exercise to make exercise part of routine and to increase amount of physical activity.;Long Term: Exercising regularly at least 3-5 days a week.       Increase Strength and Stamina Yes       Intervention Provide advice, education, support and counseling about physical activity/exercise needs.;Develop an individualized exercise prescription for aerobic and resistive training based on initial evaluation findings, risk stratification, comorbidities and participant's personal goals.       Expected Outcomes Short Term: Increase workloads from initial exercise prescription for resistance, speed, and METs.;Short Term: Perform resistance training exercises routinely during rehab and add in resistance training at home;Long Term: Improve cardiorespiratory fitness, muscular endurance and strength as measured by increased METs and functional capacity (6MWT)       Able to understand and use rate of perceived exertion (RPE) scale Yes       Intervention Provide education and explanation on how to use RPE scale       Expected Outcomes Short Term: Able to use RPE daily in rehab to express  subjective intensity level;Long Term:  Able to use RPE to guide intensity level when exercising independently       Able to understand and use Dyspnea scale Yes       Intervention Provide education and explanation on how to use Dyspnea scale  Expected Outcomes Short Term: Able to use Dyspnea scale daily in rehab to express subjective sense of shortness of breath during exertion;Long Term: Able to use Dyspnea scale to guide intensity level when exercising independently       Knowledge and understanding of Target Heart Rate Range (THRR) Yes       Intervention Provide education and explanation of THRR including how the numbers were predicted and where they are located for reference       Expected Outcomes Short Term: Able to state/look up THRR;Long Term: Able to use THRR to govern intensity when exercising independently;Short Term: Able to use daily as guideline for intensity in rehab       Able to check pulse independently Yes       Intervention Provide education and demonstration on how to check pulse in carotid and radial arteries.;Review the importance of being able to check your own pulse for safety during independent exercise       Expected Outcomes Short Term: Able to explain why pulse checking is important during independent exercise;Long Term: Able to check pulse independently and accurately       Understanding of Exercise Prescription Yes       Intervention Provide education, explanation, and written materials on patient's individual exercise prescription       Expected Outcomes Short Term: Able to explain program exercise prescription;Long Term: Able to explain home exercise prescription to exercise independently                Exercise Goals Re-Evaluation :   Discharge Exercise Prescription (Final Exercise Prescription Changes):  Exercise Prescription Changes - 12/05/21 1500       Response to Exercise   Blood Pressure (Admit) 132/82    Blood Pressure (Exercise) 152/80     Blood Pressure (Exit) 120/82    Heart Rate (Admit) 69 bpm    Heart Rate (Exercise) 105 bpm    Heart Rate (Exit) 71 bpm    Oxygen Saturation (Admit) 100 %    Oxygen Saturation (Exercise) 99 %    Oxygen Saturation (Exit) 100 %    Rating of Perceived Exertion (Exercise) 7    Perceived Dyspnea (Exercise) 0    Symptoms none    Comments 6 MWT results             Nutrition:  Target Goals: Understanding of nutrition guidelines, daily intake of sodium 1500mg , cholesterol 200mg , calories 30% from fat and 7% or less from saturated fats, daily to have 5 or more servings of fruits and vegetables.  Education: All About Nutrition: -Group instruction provided by verbal, written material, interactive activities, discussions, models, and posters to present general guidelines for heart healthy nutrition including fat, fiber, MyPlate, the role of sodium in heart healthy nutrition, utilization of the nutrition label, and utilization of this knowledge for meal planning. Follow up email sent as well. Written material given at graduation. Flowsheet Row Cardiac Rehab from 12/05/2021 in [facility] Cardiac and Pulmonary Rehab  Education need identified 12/05/21       Biometrics:  Pre Biometrics - 12/05/21 1548       Pre Biometrics   Height 5' 0.25" (1.53 m)    Weight 148 lb 6.4 oz (67.3 kg)    BMI (Calculated) 28.76    Single Leg Stand 2.93 seconds              Nutrition Therapy Plan and Nutrition Goals:  Nutrition Therapy & Goals - 12/05/21 1555  Intervention Plan   Intervention Prescribe, educate and counsel regarding individualized specific dietary modifications aiming towards targeted core components such as weight, hypertension, lipid management, diabetes, heart failure and other comorbidities.    Expected Outcomes Short Term Goal: Understand basic principles of dietary content, such as calories, fat, sodium, cholesterol and nutrients.;Short Term Goal: A plan has been developed with  personal nutrition goals set during dietitian appointment.;Long Term Goal: Adherence to prescribed nutrition plan.             Nutrition Assessments:  MEDIFICTS Score Key: ?70 Need to make dietary changes  40-70 Heart Healthy Diet ? 40 Therapeutic Level Cholesterol Diet  Flowsheet Row Cardiac Rehab from 12/05/2021 in [facility] Cardiac and Pulmonary Rehab  Picture Your Plate Total Score on Admission 82      Picture Your Plate Scores: <26 Unhealthy dietary pattern with much room for improvement. 41-50 Dietary pattern unlikely to meet recommendations for good health and room for improvement. 51-60 More healthful dietary pattern, with some room for improvement.  >60 Healthy dietary pattern, although there may be some specific behaviors that could be improved.    Nutrition Goals Re-Evaluation:   Nutrition Goals Discharge (Final Nutrition Goals Re-Evaluation):   Psychosocial: Target Goals: Acknowledge presence or absence of significant depression and/or stress, maximize coping skills, provide positive support system. Participant is able to verbalize types and ability to use techniques and skills needed for reducing stress and depression.   Education: Stress, Anxiety, and Depression - Group verbal and visual presentation to define topics covered.  Reviews how body is impacted by stress, anxiety, and depression.  Also discusses healthy ways to reduce stress and to treat/manage anxiety and depression.  Written material given at graduation.   Education: Sleep Hygiene -Provides group verbal and written instruction about how sleep can affect your health.  Define sleep hygiene, discuss sleep cycles and impact of sleep habits. Review good sleep hygiene tips.    Initial Review & Psychosocial Screening:  Initial Psych Review & Screening - 11/18/21 1015       Initial Review   Current issues with None Identified      Family Dynamics   Good Support System? Yes   brother, daughter (works at  [employer]), daughter in [place], her Mom     Barriers   Psychosocial barriers to participate in program There are no identifiable barriers or psychosocial needs.      Screening Interventions   Interventions To provide support and resources with identified psychosocial needs;Provide feedback about the scores to participant;Encouraged to exercise    Expected Outcomes Short Term goal: Utilizing psychosocial counselor, staff and physician to assist with identification of specific Stressors or current issues interfering with healing process. Setting desired goal for each stressor or current issue identified.;Long Term Goal: Stressors or current issues are controlled or eliminated.;Short Term goal: Identification and review with participant of any Quality of Life or Depression concerns found by scoring the questionnaire.;Long Term goal: The participant improves quality of Life and PHQ9 Scores as seen by post scores and/or verbalization of changes             Quality of Life Scores:   Quality of Life - 12/05/21 1555       Quality of Life   Select Quality of Life      Quality of Life Scores   Health/Function Pre 26.6 %    Socioeconomic Pre 26.25 %    Psych/Spiritual Pre 30 %    Family Pre 27 %  GLOBAL Pre 27.26 %            Scores of 19 and below usually indicate a poorer quality of life in these areas.  A difference of  2-3 points is a clinically meaningful difference.  A difference of 2-3 points in the total score of the Quality of Life Index has been associated with significant improvement in overall quality of life, self-image, physical symptoms, and general health in studies assessing change in quality of life.  PHQ-9: Recent Review Flowsheet Data     Depression screen [facility] 2/9 12/05/2021   Decreased Interest 0   Down, Depressed, Hopeless 0   PHQ - 2 Score 0   Altered sleeping 0   Tired, decreased energy 0   Change in appetite 0   Feeling bad or failure about yourself  0    Trouble concentrating 0   Moving slowly or fidgety/restless 0   Suicidal thoughts 0   PHQ-9 Score 0   Difficult doing work/chores Not difficult at all      Interpretation of Total Score  Total Score Depression Severity:  1-4 = Minimal depression, 5-9 = Mild depression, 10-14 = Moderate depression, 15-19 = Moderately severe depression, 20-27 = Severe depression   Psychosocial Evaluation and Intervention:  Psychosocial Evaluation - 11/18/21 1028       Psychosocial Evaluation & Interventions   Interventions Encouraged to exercise with the program and follow exercise prescription    Comments Jackelyn Poling has no barriers to attending the program. She lives alone and has a dog. Her support system is her 2 daughters, her brother and her mother.  She is ready to get started. She would like to start an exercise regimen she can continue after discharge fro the program. She is ready to get started.    Expected Outcomes STG Attend all scheduled sessions, work on progression with exerciseLTG Jackelyn Poling will be able to continue her progression after discharge.             Psychosocial Re-Evaluation:   Psychosocial Discharge (Final Psychosocial Re-Evaluation):   Vocational Rehabilitation: Provide vocational rehab assistance to qualifying candidates.   Vocational Rehab Evaluation & Intervention:  Vocational Rehab - 11/18/21 1017       Initial Vocational Rehab Evaluation & Intervention   Assessment shows need for Vocational Rehabilitation No      Vocational Rehab Re-Evaulation   Comments retired      Discharge Vocational Rehab   Discharge Vocational Rehabilitation retired             Education: Education Goals: Education classes will be provided on a variety of topics geared toward better understanding of heart health and risk factor modification. Participant will state understanding/return demonstration of topics presented as noted by education test scores.  Learning  Barriers/Preferences:   General Cardiac Education Topics:  AED/CPR: - Group verbal and written instruction with the use of models to demonstrate the basic use of the AED with the basic ABC's of resuscitation.   Anatomy and Cardiac Procedures: - Group verbal and visual presentation and models provide information about basic cardiac anatomy and function. Reviews the testing methods done to diagnose heart disease and the outcomes of the test results. Describes the treatment choices: Medical Management, Angioplasty, or Coronary Bypass Surgery for treating various heart conditions including Myocardial Infarction, Angina, Valve Disease, and Cardiac Arrhythmias.  Written material given at graduation. Flowsheet Row Cardiac Rehab from 12/05/2021 in [facility] Cardiac and Pulmonary Rehab  Education need identified 12/05/21  Medication Safety: - Group verbal and visual instruction to review commonly prescribed medications for heart and lung disease. Reviews the medication, class of the drug, and side effects. Includes the steps to properly store meds and maintain the prescription regimen.  Written material given at graduation.   Intimacy: - Group verbal instruction through game format to discuss how heart and lung disease can affect sexual intimacy. Written material given at graduation..   Know Your Numbers and Heart Failure: - Group verbal and visual instruction to discuss disease risk factors for cardiac and pulmonary disease and treatment options.  Reviews associated critical values for Overweight/Obesity, Hypertension, Cholesterol, and Diabetes.  Discusses basics of heart failure: signs/symptoms and treatments.  Introduces Heart Failure Zone chart for action plan for heart failure.  Written material given at graduation.   Infection Prevention: - Provides verbal and written material to individual with discussion of infection control including proper hand washing and proper equipment cleaning  during exercise session. Flowsheet Row Cardiac Rehab from 12/05/2021 in [facility] Cardiac and Pulmonary Rehab  Date 12/05/21  Educator [facility]  Instruction Review Code 1- Verbalizes Understanding       Falls Prevention: - Provides verbal and written material to individual with discussion of falls prevention and safety. Flowsheet Row Cardiac Rehab from 12/05/2021 in [facility] Cardiac and Pulmonary Rehab  Date 12/05/21  Educator [facility]  Instruction Review Code 1- Verbalizes Understanding       Other: -Provides group and verbal instruction on various topics (see comments)   Knowledge Questionnaire Score:  Knowledge Questionnaire Score - 12/05/21 1559       Knowledge Questionnaire Score   Pre Score 24/26             Core Components/Risk Factors/Patient Goals at Admission:  Personal Goals and Risk Factors at Admission - 12/05/21 1550       Core Components/Risk Factors/Patient Goals on Admission    Weight Management Yes;Weight Loss    Admit Weight 148 lb 6.4 oz (67.3 kg)    Goal Weight: Short Term 140 lb (63.5 kg)    Goal Weight: Long Term 130 lb (59 kg)    Expected Outcomes Short Term: Continue to assess and modify interventions until short term weight is achieved;Long Term: Adherence to nutrition and physical activity/exercise program aimed toward attainment of established weight goal;Weight Loss: Understanding of general recommendations for a balanced deficit meal plan, which promotes 1-2 lb weight loss per week and includes a negative energy balance of [phone] kcal/d;Understanding of distribution of calorie intake throughout the day with the consumption of 4-5 meals/snacks;Understanding recommendations for meals to include 15-35% energy as protein, 25-35% energy from fat, 35-60% energy from carbohydrates, less than 200mg  of dietary cholesterol, 20-35 gm of total fiber daily    Diabetes Yes    Intervention Provide education about signs/symptoms and action to take for  hypo/hyperglycemia.;Provide education about proper nutrition, including hydration, and aerobic/resistive exercise prescription along with prescribed medications to achieve blood glucose in normal ranges: Fasting glucose 65-99 mg/dL    Expected Outcomes Short Term: Participant verbalizes understanding of the signs/symptoms and immediate care of hyper/hypoglycemia, proper foot care and importance of medication, aerobic/resistive exercise and nutrition plan for blood glucose control.;Long Term: Attainment of HbA1C < 7%.    Heart Failure Yes    Intervention Provide a combined exercise and nutrition program that is supplemented with education, support and counseling about heart failure. Directed toward relieving symptoms such as shortness of breath, decreased exercise tolerance, and extremity edema.    Expected  Outcomes Improve functional capacity of life;Short term: Attendance in program 2-3 days a week with increased exercise capacity. Reported lower sodium intake. Reported increased fruit and vegetable intake. Reports medication compliance.    Hypertension Yes    Intervention Provide education on lifestyle modifcations including regular physical activity/exercise, weight management, moderate sodium restriction and increased consumption of fresh fruit, vegetables, and low fat dairy, alcohol moderation, and smoking cessation.;Monitor prescription use compliance.    Expected Outcomes Short Term: Continued assessment and intervention until BP is < 140/76mm HG in hypertensive participants. < 130/31mm HG in hypertensive participants with diabetes, heart failure or chronic kidney disease.;Long Term: Maintenance of blood pressure at goal levels.    Lipids Yes    Intervention Provide education and support for participant on nutrition & aerobic/resistive exercise along with prescribed medications to achieve LDL 70mg , HDL >40mg .    Expected Outcomes Short Term: Participant states understanding of desired cholesterol  values and is compliant with medications prescribed. Participant is following exercise prescription and nutrition guidelines.;Long Term: Cholesterol controlled with medications as prescribed, with individualized exercise RX and with personalized nutrition plan. Value goals: LDL < 70mg , HDL > 40 mg.             Education:Diabetes - Individual verbal and written instruction to review signs/symptoms of diabetes, desired ranges of glucose level fasting, after meals and with exercise. Acknowledge that pre and post exercise glucose checks will be done for 3 sessions at entry of program.   Core Components/Risk Factors/Patient Goals Review:    Core Components/Risk Factors/Patient Goals at Discharge (Final Review):    ITP Comments:  ITP Comments     Row Name 11/18/21 1036 12/05/21 1532         ITP Comments Virtual orientation call completed today. shehas an appointment on Date: 12/05/2021  for EP eval and gym Orientation.  Documentation of diagnosis can be found in [facility] Date: 11/08/2021 . Completed 6MWT and gym orientation. Initial ITP created and sent for review to Dr. Emily Filbert, Medical Director.               Comments: initial ITP

## 2021-12-05 NOTE — Patient Instructions (Signed)
Patient Instructions  Patient Details  Name: Molly Hill MRN: [id number] Date of Birth: Nov 02, 1950 Referring Provider:  Sueanne Margarita, MD  Below are your personal goals for exercise, nutrition, and risk factors. Our goal is to help you stay on track towards obtaining and maintaining these goals. We will be discussing your progress on these goals with you throughout the program.  Initial Exercise Prescription:  Initial Exercise Prescription - 12/05/21 1500       Date of Initial Exercise RX and Referring Provider   Date 12/05/21    Referring Provider Dr. Fransico Him      Oxygen   Maintain Oxygen Saturation 88% or higher      Recumbant Bike   Level 2    RPM 60    Minutes 15    METs 2.41      NuStep   Level 3    SPM 80    Minutes 15    METs 2.41      Arm Ergometer   Level 1    RPM 30    Minutes 15    METs 2.41      REL-XR   Level 2    Speed 50    Minutes 15    METs 2.41      Biostep-RELP   Level 2    SPM 50    Minutes 15    METs 2.41      Track   Laps 20    Minutes 15    METs 2.09      Prescription Details   Frequency (times per week) 3    Duration Progress to 30 minutes of continuous aerobic without signs/symptoms of physical distress      Intensity   THRR 40-80% of Max Heartrate 101-133    Ratings of Perceived Exertion 11-13    Perceived Dyspnea 0-4      Progression   Progression Continue to progress workloads to maintain intensity without signs/symptoms of physical distress.      Resistance Training   Training Prescription Yes    Weight 5    Reps 10-15             Exercise Goals: Frequency: Be able to perform aerobic exercise two to three times per week in program working toward 2-5 days per week of home exercise.  Intensity: Work with a perceived exertion of 11 (fairly light) - 15 (hard) while following your exercise prescription.  We will make changes to your prescription with you as you progress through the program.    Duration: Be able to do 30 to 45 minutes of continuous aerobic exercise in addition to a 5 minute warm-up and a 5 minute cool-down routine.   Nutrition Goals: Your personal nutrition goals will be established when you do your nutrition analysis with the dietician.  The following are general nutrition guidelines to follow: Cholesterol < 200mg /day Sodium < 1500mg /day Fiber: Women under 50 yrs - 25 grams per day  Personal Goals:  Personal Goals and Risk Factors at Admission - 12/05/21 1550       Core Components/Risk Factors/Patient Goals on Admission    Weight Management Yes;Weight Loss    Admit Weight 148 lb 6.4 oz (67.3 kg)    Goal Weight: Short Term 140 lb (63.5 kg)    Goal Weight: Long Term 130 lb (59 kg)    Expected Outcomes Short Term: Continue to assess and modify interventions until short term weight is achieved;Long Term: Adherence to nutrition and  physical activity/exercise program aimed toward attainment of established weight goal;Weight Loss: Understanding of general recommendations for a balanced deficit meal plan, which promotes 1-2 lb weight loss per week and includes a negative energy balance of [phone] kcal/d;Understanding of distribution of calorie intake throughout the day with the consumption of 4-5 meals/snacks;Understanding recommendations for meals to include 15-35% energy as protein, 25-35% energy from fat, 35-60% energy from carbohydrates, less than 200mg  of dietary cholesterol, 20-35 gm of total fiber daily    Diabetes Yes    Intervention Provide education about signs/symptoms and action to take for hypo/hyperglycemia.;Provide education about proper nutrition, including hydration, and aerobic/resistive exercise prescription along with prescribed medications to achieve blood glucose in normal ranges: Fasting glucose 65-99 mg/dL    Expected Outcomes Short Term: Participant verbalizes understanding of the signs/symptoms and immediate care of hyper/hypoglycemia, proper  foot care and importance of medication, aerobic/resistive exercise and nutrition plan for blood glucose control.;Long Term: Attainment of HbA1C < 7%.    Heart Failure Yes    Intervention Provide a combined exercise and nutrition program that is supplemented with education, support and counseling about heart failure. Directed toward relieving symptoms such as shortness of breath, decreased exercise tolerance, and extremity edema.    Expected Outcomes Improve functional capacity of life;Short term: Attendance in program 2-3 days a week with increased exercise capacity. Reported lower sodium intake. Reported increased fruit and vegetable intake. Reports medication compliance.    Hypertension Yes    Intervention Provide education on lifestyle modifcations including regular physical activity/exercise, weight management, moderate sodium restriction and increased consumption of fresh fruit, vegetables, and low fat dairy, alcohol moderation, and smoking cessation.;Monitor prescription use compliance.    Expected Outcomes Short Term: Continued assessment and intervention until BP is < 140/79mm HG in hypertensive participants. < 130/70mm HG in hypertensive participants with diabetes, heart failure or chronic kidney disease.;Long Term: Maintenance of blood pressure at goal levels.    Lipids Yes    Intervention Provide education and support for participant on nutrition & aerobic/resistive exercise along with prescribed medications to achieve LDL 70mg , HDL >40mg .    Expected Outcomes Short Term: Participant states understanding of desired cholesterol values and is compliant with medications prescribed. Participant is following exercise prescription and nutrition guidelines.;Long Term: Cholesterol controlled with medications as prescribed, with individualized exercise RX and with personalized nutrition plan. Value goals: LDL < 70mg , HDL > 40 mg.             Tobacco Use Initial Evaluation: Social History    Tobacco Use  Smoking Status Former   Years: 3.00   Types: Cigarettes   Quit date: 08/15/1979   Years since quitting: 42.3  Smokeless Tobacco Never  Tobacco Comments   1 pack would last a week.    Exercise Goals and Review:  Exercise Goals     Row Name 12/05/21 1546             Exercise Goals   Increase Physical Activity Yes       Intervention Provide advice, education, support and counseling about physical activity/exercise needs.;Develop an individualized exercise prescription for aerobic and resistive training based on initial evaluation findings, risk stratification, comorbidities and participant's personal goals.       Expected Outcomes Short Term: Attend rehab on a regular basis to increase amount of physical activity.;Long Term: Add in home exercise to make exercise part of routine and to increase amount of physical activity.;Long Term: Exercising regularly at least 3-5 days a week.  Increase Strength and Stamina Yes       Intervention Provide advice, education, support and counseling about physical activity/exercise needs.;Develop an individualized exercise prescription for aerobic and resistive training based on initial evaluation findings, risk stratification, comorbidities and participant's personal goals.       Expected Outcomes Short Term: Increase workloads from initial exercise prescription for resistance, speed, and METs.;Short Term: Perform resistance training exercises routinely during rehab and add in resistance training at home;Long Term: Improve cardiorespiratory fitness, muscular endurance and strength as measured by increased METs and functional capacity (6MWT)       Able to understand and use rate of perceived exertion (RPE) scale Yes       Intervention Provide education and explanation on how to use RPE scale       Expected Outcomes Short Term: Able to use RPE daily in rehab to express subjective intensity level;Long Term:  Able to use RPE to guide  intensity level when exercising independently       Able to understand and use Dyspnea scale Yes       Intervention Provide education and explanation on how to use Dyspnea scale       Expected Outcomes Short Term: Able to use Dyspnea scale daily in rehab to express subjective sense of shortness of breath during exertion;Long Term: Able to use Dyspnea scale to guide intensity level when exercising independently       Knowledge and understanding of Target Heart Rate Range (THRR) Yes       Intervention Provide education and explanation of THRR including how the numbers were predicted and where they are located for reference       Expected Outcomes Short Term: Able to state/look up THRR;Long Term: Able to use THRR to govern intensity when exercising independently;Short Term: Able to use daily as guideline for intensity in rehab       Able to check pulse independently Yes       Intervention Provide education and demonstration on how to check pulse in carotid and radial arteries.;Review the importance of being able to check your own pulse for safety during independent exercise       Expected Outcomes Short Term: Able to explain why pulse checking is important during independent exercise;Long Term: Able to check pulse independently and accurately       Understanding of Exercise Prescription Yes       Intervention Provide education, explanation, and written materials on patient's individual exercise prescription       Expected Outcomes Short Term: Able to explain program exercise prescription;Long Term: Able to explain home exercise prescription to exercise independently                Copy of goals given to participant.

## 2021-12-07 ENCOUNTER — Encounter: Payer: Self-pay | Admitting: *Deleted

## 2021-12-07 DIAGNOSIS — Z952 Presence of prosthetic heart valve: Secondary | ICD-10-CM

## 2021-12-07 NOTE — Progress Notes (Signed)
Cardiac Individual Treatment Plan  Patient Details  Name: Molly Hill MRN: [id number] Date of Birth: April 23, 1951 Referring Provider:   Flowsheet Row Cardiac Rehab from 12/05/2021 in [facility] Cardiac and Pulmonary Rehab  Referring Provider Dr. Fransico Him       Initial Encounter Date:  Flowsheet Row Cardiac Rehab from 12/05/2021 in [facility] Cardiac and Pulmonary Rehab  Date 12/05/21       Visit Diagnosis: S/P TAVR (transcatheter aortic valve replacement)  Patient's Home Medications on Admission:  Current Outpatient Medications:    acetaminophen (TYLENOL) 500 MG tablet, Take 1,000 mg by mouth every 6 (six) hours as needed for moderate pain or headache., Disp: , Rfl:    amoxicillin (AMOXIL) 500 MG capsule, Take 4 capsules by mouth 1 hour prior to dental appointments, Disp: 4 capsule, Rfl: 3   aspirin EC 81 MG tablet, Take 81 mg by mouth at bedtime., Disp: , Rfl:    atorvastatin (LIPITOR) 80 MG tablet, Take 1 tablet (80 mg total) by mouth at bedtime., Disp: 90 tablet, Rfl: 3   BIOTIN PO, Take by mouth daily., Disp: , Rfl:    Biotin w/ Vitamins C & E (HAIR/SKIN/NAILS PO), Take 1 capsule by mouth daily. (Patient not taking: Reported on 11/18/2021), Disp: , Rfl:    bisacodyl (DULCOLAX) 5 MG EC tablet, Take 2 tablets (10 mg total) by mouth daily as needed for moderate constipation., Disp: 30 tablet, Rfl: 0   Calcium Carb-Cholecalciferol (CALCIUM 600 + D PO), Take 1 tablet by mouth daily., Disp: , Rfl:    cholecalciferol (VITAMIN D) 25 MCG (1000 UNIT) tablet, Take 1,000 Units by mouth daily., Disp: , Rfl:    clopidogrel (PLAVIX) 75 MG tablet, Take 1 tablet (75 mg total) by mouth daily., Disp: 90 tablet, Rfl: 3   docusate sodium (COLACE) 100 MG capsule, Take 2 capsules (200 mg total) by mouth daily., Disp: 10 capsule, Rfl: 0   fexofenadine (ALLEGRA) 180 MG tablet, Take 180 mg by mouth daily as needed for allergies., Disp: , Rfl:    fluticasone (FLONASE) 50 MCG/ACT nasal spray, Place 1 spray into  both nostrils as needed for allergies., Disp: , Rfl:    furosemide (LASIX) 40 MG tablet, Take 1 tablet (40 mg total) by mouth daily., Disp: 90 tablet, Rfl: 3   glucose blood test strip, 1 each by Other route 4 (four) times daily -  before meals and at bedtime. Use as instructed, Disp: , Rfl:    insulin NPH Human (NOVOLIN N) 100 UNIT/ML injection, Inject 18 Units into the skin 2 (two) times daily before a meal., Disp: , Rfl:    insulin regular (NOVOLIN R) 100 units/mL injection, Inject 4-9 Units into the skin 3 (three) times daily before meals., Disp: , Rfl:    loperamide (IMODIUM A-D) 2 MG tablet, Take 1-2 mg by mouth 4 (four) times daily as needed for diarrhea or loose stools., Disp: , Rfl:    metoprolol succinate (TOPROL XL) 25 MG 24 hr tablet, Take 1 tablet (25 mg total) by mouth daily., Disp: 90 tablet, Rfl: 3   Naphazoline-Pheniramine (EYE ALLERGY RELIEF OP), Place 1 drop into both eyes daily as needed (allergies)., Disp: , Rfl:    nitroGLYCERIN (NITROSTAT) 0.4 MG SL tablet, Place 1 tablet (0.4 mg total) under the tongue every 5 (five) minutes as needed for chest pain., Disp: 25 tablet, Rfl: 3   vitamin B-12 (CYANOCOBALAMIN) 1000 MCG tablet, Take 1,000 mcg by mouth daily., Disp: , Rfl:    vitamin C (  ASCORBIC ACID) 500 MG tablet, Take 500 mg by mouth daily., Disp: , Rfl:    Zinc 50 MG CAPS, Take 50 mg by mouth daily., Disp: , Rfl:   Past Medical History: Past Medical History:  Diagnosis Date   Alopecia    Anemia    Arthritis    Bicuspid aortic valve    mild to moderate AS by echo 2022   Chronic diastolic (congestive) heart failure (HCC)    Coronary arteriosclerosis in native artery    s/p PCI of LAD in 2013 & NSTEMI with staged DES to pLAD (10/2021)   DM (diabetes mellitus), type 1 (HCC)    GERD (gastroesophageal reflux disease)    History of breast cancer    Estrogen receptor negative. s/p chemo   Hyperglycemia    Hyperlipidemia    Hypertension    Mitral regurgitation    mild to  moderate by echo 2020   S/P TAVR (transcatheter aortic valve replacement) 11/08/2021   s/p TAVR with a 23 mm Edwards S3UR via the TF approach by Dr. Burt Knack and Dr. Cyndia Bent   Tendinitis of left wrist     Tobacco Use: Social History   Tobacco Use  Smoking Status Former   Years: 3.00   Types: Cigarettes   Quit date: 08/15/1979   Years since quitting: 42.3  Smokeless Tobacco Never  Tobacco Comments   1 pack would last a week.    Labs: Recent Review Flowsheet Data     Labs for ITP Cardiac and Pulmonary Rehab Latest Ref Rng & Units 10/10/2021 10/11/2021 11/04/2021 11/08/2021 11/08/2021   Cholestrol 0 - 200 mg/dL 99 - - - -   LDLCALC 0 - 99 mg/dL 36 - - - -   HDL >40 mg/dL 39(L) - - - -   Trlycerides <150 mg/dL 122 - - - -   Hemoglobin A1c 4.8 - 5.6 % - - - - -   PHART 7.350 - 7.450 - 7.448 7.419 - -   PCO2ART 32.0 - 48.0 mmHg - 37.7 44.9 - -   HCO3 20.0 - 28.0 mmol/L - 25.9 28.5(H) - -   TCO2 22 - 32 mmol/L - 27 - 29 27   O2SAT % - 93.0 98.2 - -        Exercise Target Goals: Exercise Program Goal: Individual exercise prescription set using results from initial 6 min walk test and THRR while considering  patients activity barriers and safety.   Exercise Prescription Goal: Initial exercise prescription builds to 30-45 minutes a day of aerobic activity, 2-3 days per week.  Home exercise guidelines will be given to patient during program as part of exercise prescription that the participant will acknowledge.   Education: Aerobic Exercise: - Group verbal and visual presentation on the components of exercise prescription. Introduces F.I.T.T principle from ACSM for exercise prescriptions.  Reviews F.I.T.T. principles of aerobic exercise including progression. Written material given at graduation.   Education: Resistance Exercise: - Group verbal and visual presentation on the components of exercise prescription. Introduces F.I.T.T principle from ACSM for exercise prescriptions   Reviews F.I.T.T. principles of resistance exercise including progression. Written material given at graduation.    Education: Exercise & Equipment Safety: - Individual verbal instruction and demonstration of equipment use and safety with use of the equipment. Flowsheet Row Cardiac Rehab from 12/05/2021 in [facility] Cardiac and Pulmonary Rehab  Date 12/05/21  Educator [facility]  Instruction Review Code 1- [employer] Understanding       Education: Exercise Physiology & General  Exercise Guidelines: - Group verbal and written instruction with models to review the exercise physiology of the cardiovascular system and associated critical values. Provides general exercise guidelines with specific guidelines to those with heart or lung disease.    Education: Flexibility, Balance, Mind/Body Relaxation: - Group verbal and visual presentation with interactive activity on the components of exercise prescription. Introduces F.I.T.T principle from ACSM for exercise prescriptions. Reviews F.I.T.T. principles of flexibility and balance exercise training including progression. Also discusses the mind body connection.  Reviews various relaxation techniques to help reduce and manage stress (i.e. Deep breathing, progressive muscle relaxation, and visualization). Balance handout provided to take home. Written material given at graduation.   Activity Barriers & Risk Stratification:  Activity Barriers & Cardiac Risk Stratification - 11/18/21 1011       Activity Barriers & Cardiac Risk Stratification   Activity Barriers Joint Problems   has broken both ankles in past,  arthritis in fingers   Cardiac Risk Stratification Moderate             6 Minute Walk:  6 Minute Walk     Row Name 12/05/21 1535         6 Minute Walk   Phase Initial     Distance 1040 feet     Walk Time 6 minutes     # of Rest Breaks 0     MPH 1.97     METS 2.41     RPE 7     Perceived Dyspnea  0     VO2 Peak 8.43     Symptoms No      Resting HR 69 bpm     Resting BP 132/82     Resting Oxygen Saturation  100 %     Exercise Oxygen Saturation  during 6 min walk 99 %     Max Ex. HR 105 bpm     Max Ex. BP 152/80     2 Minute Post BP 138/82              Oxygen Initial Assessment:   Oxygen Re-Evaluation:   Oxygen Discharge (Final Oxygen Re-Evaluation):   Initial Exercise Prescription:  Initial Exercise Prescription - 12/05/21 1500       Date of Initial Exercise RX and Referring Provider   Date 12/05/21    Referring Provider Dr. Fransico Him      Oxygen   Maintain Oxygen Saturation 88% or higher      Recumbant Bike   Level 2    RPM 60    Minutes 15    METs 2.41      NuStep   Level 3    SPM 80    Minutes 15    METs 2.41      Arm Ergometer   Level 1    RPM 30    Minutes 15    METs 2.41      REL-XR   Level 2    Speed 50    Minutes 15    METs 2.41      Biostep-RELP   Level 2    SPM 50    Minutes 15    METs 2.41      Track   Laps 20    Minutes 15    METs 2.09      Prescription Details   Frequency (times per week) 3    Duration Progress to 30 minutes of continuous aerobic without signs/symptoms of physical distress  Intensity   THRR 40-80% of Max Heartrate 101-133    Ratings of Perceived Exertion 11-13    Perceived Dyspnea 0-4      Progression   Progression Continue to progress workloads to maintain intensity without signs/symptoms of physical distress.      Resistance Training   Training Prescription Yes    Weight 5    Reps 10-15             Perform Capillary Blood Glucose checks as needed.  Exercise Prescription Changes:   Exercise Prescription Changes     Row Name 12/05/21 1500             Response to Exercise   Blood Pressure (Admit) 132/82       Blood Pressure (Exercise) 152/80       Blood Pressure (Exit) 120/82       Heart Rate (Admit) 69 bpm       Heart Rate (Exercise) 105 bpm       Heart Rate (Exit) 71 bpm       Oxygen Saturation (Admit)  100 %       Oxygen Saturation (Exercise) 99 %       Oxygen Saturation (Exit) 100 %       Rating of Perceived Exertion (Exercise) 7       Perceived Dyspnea (Exercise) 0       Symptoms none       Comments 6 MWT results                Exercise Comments:   Exercise Goals and Review:   Exercise Goals     Row Name 12/05/21 1546             Exercise Goals   Increase Physical Activity Yes       Intervention Provide advice, education, support and counseling about physical activity/exercise needs.;Develop an individualized exercise prescription for aerobic and resistive training based on initial evaluation findings, risk stratification, comorbidities and participant's personal goals.       Expected Outcomes Short Term: Attend rehab on a regular basis to increase amount of physical activity.;Long Term: Add in home exercise to make exercise part of routine and to increase amount of physical activity.;Long Term: Exercising regularly at least 3-5 days a week.       Increase Strength and Stamina Yes       Intervention Provide advice, education, support and counseling about physical activity/exercise needs.;Develop an individualized exercise prescription for aerobic and resistive training based on initial evaluation findings, risk stratification, comorbidities and participant's personal goals.       Expected Outcomes Short Term: Increase workloads from initial exercise prescription for resistance, speed, and METs.;Short Term: Perform resistance training exercises routinely during rehab and add in resistance training at home;Long Term: Improve cardiorespiratory fitness, muscular endurance and strength as measured by increased METs and functional capacity (6MWT)       Able to understand and use rate of perceived exertion (RPE) scale Yes       Intervention Provide education and explanation on how to use RPE scale       Expected Outcomes Short Term: Able to use RPE daily in rehab to express  subjective intensity level;Long Term:  Able to use RPE to guide intensity level when exercising independently       Able to understand and use Dyspnea scale Yes       Intervention Provide education and explanation on how to use Dyspnea scale  Expected Outcomes Short Term: Able to use Dyspnea scale daily in rehab to express subjective sense of shortness of breath during exertion;Long Term: Able to use Dyspnea scale to guide intensity level when exercising independently       Knowledge and understanding of Target Heart Rate Range (THRR) Yes       Intervention Provide education and explanation of THRR including how the numbers were predicted and where they are located for reference       Expected Outcomes Short Term: Able to state/look up THRR;Long Term: Able to use THRR to govern intensity when exercising independently;Short Term: Able to use daily as guideline for intensity in rehab       Able to check pulse independently Yes       Intervention Provide education and demonstration on how to check pulse in carotid and radial arteries.;Review the importance of being able to check your own pulse for safety during independent exercise       Expected Outcomes Short Term: Able to explain why pulse checking is important during independent exercise;Long Term: Able to check pulse independently and accurately       Understanding of Exercise Prescription Yes       Intervention Provide education, explanation, and written materials on patient's individual exercise prescription       Expected Outcomes Short Term: Able to explain program exercise prescription;Long Term: Able to explain home exercise prescription to exercise independently                Exercise Goals Re-Evaluation :   Discharge Exercise Prescription (Final Exercise Prescription Changes):  Exercise Prescription Changes - 12/05/21 1500       Response to Exercise   Blood Pressure (Admit) 132/82    Blood Pressure (Exercise) 152/80     Blood Pressure (Exit) 120/82    Heart Rate (Admit) 69 bpm    Heart Rate (Exercise) 105 bpm    Heart Rate (Exit) 71 bpm    Oxygen Saturation (Admit) 100 %    Oxygen Saturation (Exercise) 99 %    Oxygen Saturation (Exit) 100 %    Rating of Perceived Exertion (Exercise) 7    Perceived Dyspnea (Exercise) 0    Symptoms none    Comments 6 MWT results             Nutrition:  Target Goals: Understanding of nutrition guidelines, daily intake of sodium 1500mg , cholesterol 200mg , calories 30% from fat and 7% or less from saturated fats, daily to have 5 or more servings of fruits and vegetables.  Education: All About Nutrition: -Group instruction provided by verbal, written material, interactive activities, discussions, models, and posters to present general guidelines for heart healthy nutrition including fat, fiber, MyPlate, the role of sodium in heart healthy nutrition, utilization of the nutrition label, and utilization of this knowledge for meal planning. Follow up email sent as well. Written material given at graduation. Flowsheet Row Cardiac Rehab from 12/05/2021 in [facility] Cardiac and Pulmonary Rehab  Education need identified 12/05/21       Biometrics:  Pre Biometrics - 12/05/21 1548       Pre Biometrics   Height 5' 0.25" (1.53 m)    Weight 148 lb 6.4 oz (67.3 kg)    BMI (Calculated) 28.76    Single Leg Stand 2.93 seconds              Nutrition Therapy Plan and Nutrition Goals:  Nutrition Therapy & Goals - 12/05/21 1555  Intervention Plan   Intervention Prescribe, educate and counsel regarding individualized specific dietary modifications aiming towards targeted core components such as weight, hypertension, lipid management, diabetes, heart failure and other comorbidities.    Expected Outcomes Short Term Goal: Understand basic principles of dietary content, such as calories, fat, sodium, cholesterol and nutrients.;Short Term Goal: A plan has been developed with  personal nutrition goals set during dietitian appointment.;Long Term Goal: Adherence to prescribed nutrition plan.             Nutrition Assessments:  MEDIFICTS Score Key: ?70 Need to make dietary changes  40-70 Heart Healthy Diet ? 40 Therapeutic Level Cholesterol Diet  Flowsheet Row Cardiac Rehab from 12/05/2021 in [facility] Cardiac and Pulmonary Rehab  Picture Your Plate Total Score on Admission 82      Picture Your Plate Scores: <41 Unhealthy dietary pattern with much room for improvement. 41-50 Dietary pattern unlikely to meet recommendations for good health and room for improvement. 51-60 More healthful dietary pattern, with some room for improvement.  >60 Healthy dietary pattern, although there may be some specific behaviors that could be improved.    Nutrition Goals Re-Evaluation:   Nutrition Goals Discharge (Final Nutrition Goals Re-Evaluation):   Psychosocial: Target Goals: Acknowledge presence or absence of significant depression and/or stress, maximize coping skills, provide positive support system. Participant is able to verbalize types and ability to use techniques and skills needed for reducing stress and depression.   Education: Stress, Anxiety, and Depression - Group verbal and visual presentation to define topics covered.  Reviews how body is impacted by stress, anxiety, and depression.  Also discusses healthy ways to reduce stress and to treat/manage anxiety and depression.  Written material given at graduation.   Education: Sleep Hygiene -Provides group verbal and written instruction about how sleep can affect your health.  Define sleep hygiene, discuss sleep cycles and impact of sleep habits. Review good sleep hygiene tips.    Initial Review & Psychosocial Screening:  Initial Psych Review & Screening - 11/18/21 1015       Initial Review   Current issues with None Identified      Family Dynamics   Good Support System? Yes   brother, daughter (works at  [employer]), daughter in [place], her Mom     Barriers   Psychosocial barriers to participate in program There are no identifiable barriers or psychosocial needs.      Screening Interventions   Interventions To provide support and resources with identified psychosocial needs;Provide feedback about the scores to participant;Encouraged to exercise    Expected Outcomes Short Term goal: Utilizing psychosocial counselor, staff and physician to assist with identification of specific Stressors or current issues interfering with healing process. Setting desired goal for each stressor or current issue identified.;Long Term Goal: Stressors or current issues are controlled or eliminated.;Short Term goal: Identification and review with participant of any Quality of Life or Depression concerns found by scoring the questionnaire.;Long Term goal: The participant improves quality of Life and PHQ9 Scores as seen by post scores and/or verbalization of changes             Quality of Life Scores:   Quality of Life - 12/05/21 1555       Quality of Life   Select Quality of Life      Quality of Life Scores   Health/Function Pre 26.6 %    Socioeconomic Pre 26.25 %    Psych/Spiritual Pre 30 %    Family Pre 27 %  GLOBAL Pre 27.26 %            Scores of 19 and below usually indicate a poorer quality of life in these areas.  A difference of  2-3 points is a clinically meaningful difference.  A difference of 2-3 points in the total score of the Quality of Life Index has been associated with significant improvement in overall quality of life, self-image, physical symptoms, and general health in studies assessing change in quality of life.  PHQ-9: Recent Review Flowsheet Data     Depression screen [facility] 2/9 12/05/2021   Decreased Interest 0   Down, Depressed, Hopeless 0   PHQ - 2 Score 0   Altered sleeping 0   Tired, decreased energy 0   Change in appetite 0   Feeling bad or failure about yourself  0    Trouble concentrating 0   Moving slowly or fidgety/restless 0   Suicidal thoughts 0   PHQ-9 Score 0   Difficult doing work/chores Not difficult at all      Interpretation of Total Score  Total Score Depression Severity:  1-4 = Minimal depression, 5-9 = Mild depression, 10-14 = Moderate depression, 15-19 = Moderately severe depression, 20-27 = Severe depression   Psychosocial Evaluation and Intervention:  Psychosocial Evaluation - 11/18/21 1028       Psychosocial Evaluation & Interventions   Interventions Encouraged to exercise with the program and follow exercise prescription    Comments Molly Hill has no barriers to attending the program. She lives alone and has a dog. Her support system is her 2 daughters, her brother and her mother.  She is ready to get started. She would like to start an exercise regimen she can continue after discharge fro the program. She is ready to get started.    Expected Outcomes STG Attend all scheduled sessions, work on progression with exerciseLTG Molly Hill will be able to continue her progression after discharge.             Psychosocial Re-Evaluation:   Psychosocial Discharge (Final Psychosocial Re-Evaluation):   Vocational Rehabilitation: Provide vocational rehab assistance to qualifying candidates.   Vocational Rehab Evaluation & Intervention:  Vocational Rehab - 11/18/21 1017       Initial Vocational Rehab Evaluation & Intervention   Assessment shows need for Vocational Rehabilitation No      Vocational Rehab Re-Evaulation   Comments retired      Discharge Vocational Rehab   Discharge Vocational Rehabilitation retired             Education: Education Goals: Education classes will be provided on a variety of topics geared toward better understanding of heart health and risk factor modification. Participant will state understanding/return demonstration of topics presented as noted by education test scores.  Learning  Barriers/Preferences:   General Cardiac Education Topics:  AED/CPR: - Group verbal and written instruction with the use of models to demonstrate the basic use of the AED with the basic ABC's of resuscitation.   Anatomy and Cardiac Procedures: - Group verbal and visual presentation and models provide information about basic cardiac anatomy and function. Reviews the testing methods done to diagnose heart disease and the outcomes of the test results. Describes the treatment choices: Medical Management, Angioplasty, or Coronary Bypass Surgery for treating various heart conditions including Myocardial Infarction, Angina, Valve Disease, and Cardiac Arrhythmias.  Written material given at graduation. Flowsheet Row Cardiac Rehab from 12/05/2021 in [facility] Cardiac and Pulmonary Rehab  Education need identified 12/05/21  Medication Safety: - Group verbal and visual instruction to review commonly prescribed medications for heart and lung disease. Reviews the medication, class of the drug, and side effects. Includes the steps to properly store meds and maintain the prescription regimen.  Written material given at graduation.   Intimacy: - Group verbal instruction through game format to discuss how heart and lung disease can affect sexual intimacy. Written material given at graduation..   Know Your Numbers and Heart Failure: - Group verbal and visual instruction to discuss disease risk factors for cardiac and pulmonary disease and treatment options.  Reviews associated critical values for Overweight/Obesity, Hypertension, Cholesterol, and Diabetes.  Discusses basics of heart failure: signs/symptoms and treatments.  Introduces Heart Failure Zone chart for action plan for heart failure.  Written material given at graduation.   Infection Prevention: - Provides verbal and written material to individual with discussion of infection control including proper hand washing and proper equipment cleaning  during exercise session. Flowsheet Row Cardiac Rehab from 12/05/2021 in [facility] Cardiac and Pulmonary Rehab  Date 12/05/21  Educator [facility]  Instruction Review Code 1- Verbalizes Understanding       Falls Prevention: - Provides verbal and written material to individual with discussion of falls prevention and safety. Flowsheet Row Cardiac Rehab from 12/05/2021 in [facility] Cardiac and Pulmonary Rehab  Date 12/05/21  Educator [facility]  Instruction Review Code 1- Verbalizes Understanding       Other: -Provides group and verbal instruction on various topics (see comments)   Knowledge Questionnaire Score:  Knowledge Questionnaire Score - 12/05/21 1559       Knowledge Questionnaire Score   Pre Score 24/26             Core Components/Risk Factors/Patient Goals at Admission:  Personal Goals and Risk Factors at Admission - 12/05/21 1550       Core Components/Risk Factors/Patient Goals on Admission    Weight Management Yes;Weight Loss    Admit Weight 148 lb 6.4 oz (67.3 kg)    Goal Weight: Short Term 140 lb (63.5 kg)    Goal Weight: Long Term 130 lb (59 kg)    Expected Outcomes Short Term: Continue to assess and modify interventions until short term weight is achieved;Long Term: Adherence to nutrition and physical activity/exercise program aimed toward attainment of established weight goal;Weight Loss: Understanding of general recommendations for a balanced deficit meal plan, which promotes 1-2 lb weight loss per week and includes a negative energy balance of [phone] kcal/d;Understanding of distribution of calorie intake throughout the day with the consumption of 4-5 meals/snacks;Understanding recommendations for meals to include 15-35% energy as protein, 25-35% energy from fat, 35-60% energy from carbohydrates, less than 200mg  of dietary cholesterol, 20-35 gm of total fiber daily    Diabetes Yes    Intervention Provide education about signs/symptoms and action to take for  hypo/hyperglycemia.;Provide education about proper nutrition, including hydration, and aerobic/resistive exercise prescription along with prescribed medications to achieve blood glucose in normal ranges: Fasting glucose 65-99 mg/dL    Expected Outcomes Short Term: Participant verbalizes understanding of the signs/symptoms and immediate care of hyper/hypoglycemia, proper foot care and importance of medication, aerobic/resistive exercise and nutrition plan for blood glucose control.;Long Term: Attainment of HbA1C < 7%.    Heart Failure Yes    Intervention Provide a combined exercise and nutrition program that is supplemented with education, support and counseling about heart failure. Directed toward relieving symptoms such as shortness of breath, decreased exercise tolerance, and extremity edema.    Expected  Outcomes Improve functional capacity of life;Short term: Attendance in program 2-3 days a week with increased exercise capacity. Reported lower sodium intake. Reported increased fruit and vegetable intake. Reports medication compliance.    Hypertension Yes    Intervention Provide education on lifestyle modifcations including regular physical activity/exercise, weight management, moderate sodium restriction and increased consumption of fresh fruit, vegetables, and low fat dairy, alcohol moderation, and smoking cessation.;Monitor prescription use compliance.    Expected Outcomes Short Term: Continued assessment and intervention until BP is < 140/52mm HG in hypertensive participants. < 130/78mm HG in hypertensive participants with diabetes, heart failure or chronic kidney disease.;Long Term: Maintenance of blood pressure at goal levels.    Lipids Yes    Intervention Provide education and support for participant on nutrition & aerobic/resistive exercise along with prescribed medications to achieve LDL 70mg , HDL >40mg .    Expected Outcomes Short Term: Participant states understanding of desired cholesterol  values and is compliant with medications prescribed. Participant is following exercise prescription and nutrition guidelines.;Long Term: Cholesterol controlled with medications as prescribed, with individualized exercise RX and with personalized nutrition plan. Value goals: LDL < 70mg , HDL > 40 mg.             Education:Diabetes - Individual verbal and written instruction to review signs/symptoms of diabetes, desired ranges of glucose level fasting, after meals and with exercise. Acknowledge that pre and post exercise glucose checks will be done for 3 sessions at entry of program.   Core Components/Risk Factors/Patient Goals Review:    Core Components/Risk Factors/Patient Goals at Discharge (Final Review):    ITP Comments:  ITP Comments     Row Name 11/18/21 1036 12/05/21 1532 12/07/21 0931       ITP Comments Virtual orientation call completed today. shehas an appointment on Date: 12/05/2021  for EP eval and gym Orientation.  Documentation of diagnosis can be found in [facility] Date: 11/08/2021 . Completed 6MWT and gym orientation. Initial ITP created and sent for review to Dr. Emily Filbert, Medical Director. 30 Day review completed. Medical Director ITP review done, changes made as directed, and signed approval by Medical Director.    new to program              Comments:

## 2021-12-08 NOTE — Progress Notes (Signed)
[facility] [place]                                     Cardiology Office Note:    Date:  12/09/2021   ID:  Molly Hill, DOB [birth year], MRN [id number]  PCP:  Ernestene Kiel, MD  [facility] Cardiologist:  Fransico Him, MD / Dr. Burt Knack & Dr. Cyndia Bent (TAVR) [place] Electrophysiologist:  None   Referring MD: Ernestene Kiel, MD   1 month s/p TAVR  History of Present Illness:    Molly Hill is a 71 y.o. female with a hx of breast CA s/p radiation to the right and bilateral mastectomy, DMT1, HTN, HLD, tobacco abuse, anemia, recent admission for NSTEMI and acute CHF in the setting of E.Coli bacteremia and pyelonephritis (09/2021), CAD s/p remote PCI of LAD in 2013 and NSTEMI with staged DES to pLAD (10/21/21), bicuspid AoV disease with severe AS/mild AI s/p TAVR (11/08/21) who presents to clinic for follow up.   The patient had recently noticed decreased exercise tolerance, fatigue, DOE and chest pain. She was admitted 12/25-12/30/22 for chest pain and above symptoms. She was found to have an NSTEMI and acute CHF in the setting of E.Coli bacteremia and pyelonephritis.  Echo showed LVEF 60-65% with severe aortic stenosis with a mean gradient of 40 mm hg, AVA 0.61 and DVI of 0.21. Cardiac catheterization 12/26 revealed 75% ostial right coronary artery, 90% RP AV and 40% ostial to proximal LAD; left ventricular end-diastolic pressure 8 mmHg. She was evaluated for [facility] but felt to be too high risk given radiation changes in the right apex along with ascending aorta calcifications extending from the root to the arch felt to be secondary to chest radiation. She underwent pre TAVR scans while admitted. Plan was for discharge home with ABx and to return for staged PCI and FFR followed by eventual TAVR.   Staged cath 10/21/21 showed negative FFR evaluation of the RCA, moderate nonobstructive lesion and severe flow-limiting diffuse calcific  proximal LAD stenosis of 75%, strongly positive RFR of 0.75, treated successfully with high-pressure noncompliant balloon angioplasty and stenting using a 2.75 x 26 mm Onyx frontier DES optimized with IVUS imaging. She was discharged on aspirin and plavix.   She underwent successful TAVR with a 23 mm Edwards Sapien 3 Ultra Resilia THV via the TF approach on 11/08/21. One CC was removed with deployment. Post operative echo showed EF 65%, normally functioning TAVR with a mean gradient of 7 mmHg and no PVL and mild mitral stenosis. She was discharged on continued aspirin and Plavix.   Today the patient presents to clinic for follow up. No CP or SOB. No LE edema, orthopnea or PND. No dizziness or syncope. No blood in stool or urine. No palpitations. BP has been elevated at home.    Past Medical History:  Diagnosis Date   Alopecia    Anemia    Arthritis    Bicuspid aortic valve    mild to moderate AS by echo 2022   Chronic diastolic (congestive) heart failure (HCC)    Coronary arteriosclerosis in native artery    s/p PCI of LAD in 2013 & NSTEMI with staged DES to pLAD (10/2021)   DM (diabetes mellitus), type 1 (HCC)    GERD (gastroesophageal reflux disease)    History of breast cancer    Estrogen receptor negative. s/p  chemo   Hyperglycemia    Hyperlipidemia    Hypertension    Mitral regurgitation    mild to moderate by echo 2020   S/P TAVR (transcatheter aortic valve replacement) 11/08/2021   s/p TAVR with a 23 mm Edwards S3UR via the TF approach by Dr. Burt Knack and Dr. Cyndia Bent   Tendinitis of left wrist     Past Surgical History:  Procedure Laterality Date   ANKLE SURGERY Bilateral    CATARACT EXTRACTION, BILATERAL     CHOLECYSTECTOMY     CORONARY STENT INTERVENTION     CORONARY STENT INTERVENTION N/A 10/21/2021   Procedure: CORONARY STENT INTERVENTION;  Surgeon: Sherren Mocha, MD;  Location: [place] [facility];  Service: Cardiovascular;  Laterality: N/A;   CORONARY/GRAFT ACUTE MI  REVASCULARIZATION N/A 10/10/2021   Procedure: CORONARY/GRAFT ACUTE MI REVASCULARIZATION;  Surgeon: Lorretta Harp, MD;  Location: [place] [facility];  Service: Cardiovascular;  Laterality: N/A;   CTR     ELBOW SURGERY     HAND SURGERY     BOTH THUMBS AND MIDDLE FINGERS   INTRAOPERATIVE TRANSTHORACIC ECHOCARDIOGRAM N/A 11/08/2021   Procedure: INTRAOPERATIVE TRANSTHORACIC ECHOCARDIOGRAM;  Surgeon: Sherren Mocha, MD;  Location: [place] [facility];  Service: Open Heart Surgery;  Laterality: N/A;   INTRAVASCULAR PRESSURE WIRE/FFR STUDY N/A 10/21/2021   Procedure: INTRAVASCULAR PRESSURE WIRE/FFR STUDY;  Surgeon: Sherren Mocha, MD;  Location: [place] [facility];  Service: Cardiovascular;  Laterality: N/A;   LEFT BREAST MASTECTOMY     MASCULAR  REPAIR     PORT-A-CATH REMOVAL     PORTACATH PLACEMENT     RIGHT MODIFIED MASTECTOMY Right    TRANSCATHETER AORTIC VALVE REPLACEMENT, TRANSFEMORAL N/A 11/08/2021   Procedure: TRANSCATHETER AORTIC VALVE REPLACEMENT, TRANSFEMORAL;  Surgeon: Sherren Mocha, MD;  Location: [place] [facility];  Service: Open Heart Surgery;  Laterality: N/A;   UPPER LIP SURGERY      Current Medications: Current Meds  Medication Sig   acetaminophen (TYLENOL) 500 MG tablet Take 1,000 mg by mouth every 6 (six) hours as needed for moderate pain or headache.   amoxicillin (AMOXIL) 500 MG capsule Take 4 capsules by mouth 1 hour prior to dental appointments   aspirin EC 81 MG tablet Take 81 mg by mouth at bedtime.   atorvastatin (LIPITOR) 80 MG tablet Take 1 tablet (80 mg total) by mouth at bedtime.   BIOTIN PO Take by mouth daily.   Biotin w/ Vitamins C & E (HAIR/SKIN/NAILS PO) Take 1 capsule by mouth daily.   bisacodyl (DULCOLAX) 5 MG EC tablet Take 2 tablets (10 mg total) by mouth daily as needed for moderate constipation.   Calcium Carb-Cholecalciferol (CALCIUM 600 + D PO) Take 1 tablet by mouth daily.   cholecalciferol (VITAMIN D) 25 MCG (1000 UNIT) tablet Take 1,000 Units  by mouth daily.   clopidogrel (PLAVIX) 75 MG tablet Take 1 tablet (75 mg total) by mouth daily.   docusate sodium (COLACE) 100 MG capsule Take 2 capsules (200 mg total) by mouth daily.   fexofenadine (ALLEGRA) 180 MG tablet Take 180 mg by mouth daily as needed for allergies.   fluticasone (FLONASE) 50 MCG/ACT nasal spray Place 1 spray into both nostrils as needed for allergies.   furosemide (LASIX) 40 MG tablet Take 1 tablet (40 mg total) by mouth daily.   glucose blood test strip 1 each by Other route 4 (four) times daily -  before meals and at bedtime. Use as instructed   insulin NPH Human (NOVOLIN  N) 100 UNIT/ML injection Inject 18 Units into the skin 2 (two) times daily before a meal.   insulin regular (NOVOLIN R) 100 units/mL injection Inject 4-9 Units into the skin 3 (three) times daily before meals.   loperamide (IMODIUM A-D) 2 MG tablet Take 1-2 mg by mouth 4 (four) times daily as needed for diarrhea or loose stools.   losartan (COZAAR) 25 MG tablet Take 1 tablet (25 mg total) by mouth daily.   metoprolol succinate (TOPROL XL) 25 MG 24 hr tablet Take 1 tablet (25 mg total) by mouth daily.   Naphazoline-Pheniramine (EYE ALLERGY RELIEF OP) Place 1 drop into both eyes daily as needed (allergies).   nitroGLYCERIN (NITROSTAT) 0.4 MG SL tablet Place 1 tablet (0.4 mg total) under the tongue every 5 (five) minutes as needed for chest pain.   vitamin B-12 (CYANOCOBALAMIN) 1000 MCG tablet Take 1,000 mcg by mouth daily.   vitamin C (ASCORBIC ACID) 500 MG tablet Take 500 mg by mouth daily.   Zinc 50 MG CAPS Take 50 mg by mouth daily.     Allergies:   Codeine, Onion, and Reglan [metoclopramide]   Social History   Socioeconomic History   Marital status: Widowed    Spouse name: Not on file   Number of children: Not on file   Years of education: Not on file   Highest education level: Not on file  Occupational History   Not on file  Tobacco Use   Smoking status: Former    Years: 3.00     Types: Cigarettes    Quit date: 08/15/1979    Years since quitting: 42.3   Smokeless tobacco: Never   Tobacco comments:    1 pack would last a week.  Vaping Use   Vaping Use: Never used  Substance and Sexual Activity   Alcohol use: Never   Drug use: Never   Sexual activity: Never  Other Topics Concern   Not on file  Social History Narrative   Not on file   Social Determinants of Health   Financial Resource Strain: Not on file  Food Insecurity: Not on file  Transportation Needs: Not on file  Physical Activity: Not on file  Stress: Not on file  Social Connections: Not on file     Family History: The patient's family history includes Breast cancer in her maternal aunt; CAD in her father; Colon cancer in her father; Diabetes in her brother, father, and paternal grandfather; Heart attack in her mother and paternal grandfather; Stroke in her father.  ROS:   Please see the history of present illness.    All other systems reviewed and are negative.  EKGs/Labs/Other Studies Reviewed:    The following studies were reviewed today:   TAVR OPERATIVE NOTE     Date of Procedure:                11/08/2021   Preoperative Diagnosis:      Severe Aortic Stenosis    Postoperative Diagnosis:    Same    Procedure:        Transcatheter Aortic Valve Replacement - Percutaneous Right Transfemoral Approach             Edwards Sapien 3 Ultra ResiliaTHV (size 23 mm, model # 9755RSL, serial # [id number])              Co-Surgeons:                        Gaspar Bidding  Alveria Apley, MD and Sherren Mocha, MD       Anesthesiologist:                  Raechel Ache, MD   Echocardiographer:              Osborne Oman, MD   Pre-operative Echo Findings: Severe aortic stenosis Normal left ventricular systolic function   Post-operative Echo Findings: No paravalvular leak Normal left ventricular systolic function _____________     Echo 11/09/21: IMPRESSIONS:  1. The aortic valve has been replaced with a  23 mm Sapien valve. Aortic  valve regurgitation is not visualized. Effective orifice area, by VTI  measures 1.88 cm. Aortic valve mean gradient measures 7.0 mmHg. Peak  gradient 14 mm Hg. DVI 0.64. Acceleration   time 81 ms.   2. Left ventricular ejection fraction, by estimation, is 65 to 70%. The  left ventricle has normal function. The left ventricle has no regional  wall motion abnormalities. There is mild concentric left ventricular  hypertrophy. Left ventricular diastolic  parameters are indeterminate.   3. The mitral valve is abnormal. No evidence of mitral valve  regurgitation. Moderate mitral annular calcification. Mitral valve area by  continuity equation 1.79 cm2 consisent with mild mitral stenosis. Mean  gradient 6 at a heart rate of 86 bpm.   4. Right ventricular systolic function is normal. The right ventricular  size is normal. Tricuspid regurgitation signal is inadequate for assessing  PA pressure.   Comparison(s): No significant change from prior study. Stable prosthetic  function.   _______________________   Echo 12/09/21 IMPRESSIONS  1. Left ventricular ejection fraction, by estimation, is 60 to 65%. The left ventricle has normal function. The left ventricle has no regional wall motion abnormalities. There is moderate asymmetric left ventricular hypertrophy of the basal-septal  segment. Left ventricular diastolic parameters are consistent with Grade II diastolic dysfunction (pseudonormalization). Elevated left atrial pressure.  2. Right ventricular systolic function is normal. The right ventricular size is normal. There is normal pulmonary artery systolic pressure. The estimated right ventricular systolic pressure is 64.3 mmHg.  3. The mitral valve is degenerative. Mild mitral valve regurgitation. Moderate mitral stenosis. Moderate mitral annular calcification. MG 18mHg at 67 bpm, MVA 1.1 cm^2 by continuity equation  4. There is a 23 mm Edwards Sapien prosthetic,  stented (TAVR) valve present in the aortic position Aortic valve regurgitation is not visualized. Aortic valve mean gradient measures 7.0 mmHg, unchanged from prior echo 11/09/21. Vmax 1.8 m/s, EOA 1.5 cm^2, DI 0.6. Echo findings are consistent with normal structure and function of the aortic valve  prosthesis.  5. The inferior vena cava is normal in size with greater than 50% respiratory variability, suggesting right atrial pressure of 3 mmHg.   EKG:  EKG is NOT ordered today.    Recent Labs: 11/04/2021: ALT 18; B Natriuretic Peptide 103.7 11/09/2021: Hemoglobin 10.6; Magnesium 1.9; Platelets 163 12/09/2021: BUN 26; Creatinine, Ser 1.44; Potassium 4.4; Sodium 145  Recent Lipid Panel    Component Value Date/Time   CHOL 99 10/10/2021 0605   CHOL 117 07/11/2021 1049   TRIG 122 10/10/2021 0605   HDL 39 (L) 10/10/2021 0605   HDL 56 07/11/2021 1049   CHOLHDL 2.5 10/10/2021 0605   VLDL 24 10/10/2021 0605   LDLCALC 36 10/10/2021 0605   LDLCALC 45 07/11/2021 1049     Risk Assessment/Calculations:       Physical Exam:    VS:  BP (!) 150/60 (BP Location: Left  Arm, Patient Position: Sitting, Cuff Size: Normal)    Pulse 84    Ht 5' (1.524 m)    Wt 150 lb (68 kg)    SpO2 96%    BMI 29.29 kg/m     Wt Readings from Last 3 Encounters:  12/09/21 150 lb (68 kg)  12/05/21 148 lb 6.4 oz (67.3 kg)  11/16/21 147 lb (66.7 kg)     GEN:  Well nourished, well developed in no acute distress HEENT: Normal NECK: No JVD LYMPHATICS: No lymphadenopathy CARDIAC: RRR, no murmurs, rubs, gallops RESPIRATORY:  Clear to auscultation without rales, wheezing or rhonchi  ABDOMEN: Soft, non-tender, non-distended MUSCULOSKELETAL:  No edema; No deformity  SKIN: Warm and dry.  NEUROLOGIC:  Alert and oriented x 3 PSYCHIATRIC:  Normal affect   ASSESSMENT:    1. S/P TAVR (transcatheter aortic valve replacement)   2. CAD S/P percutaneous coronary angioplasty   3. Type 1 diabetes mellitus with complications ([place])    4. Essential hypertension   5. History of breast cancer   6. Renal cyst   7. Pulmonary nodule     PLAN:    In order of problems listed above:  Severe AS s/p TAVR: echo today shows EF 60%, normally functioning TAVR with a mean gradient of 7 mm hg and no PVL. There is mild MR and moderate mitral stenosis with moderate MAC. She has NYHA class I symptoms. Continue on aspirin and plavix. She can discontinue Plaivx after 1 year given NSTEMI and PCI. SBE prophylaxis discussed; I have RX'd amoxicillin. I will see her back for 1 year follow up and echo.   CAD: admitted 09/2021 for NSTEMI in the setting of bacteremia/pyelonephritis. Staged cath 10/21/21 showed negative FFR evaluation of the RCA, moderate nonobstructive lesion and severe flow-limiting diffuse calcific proximal LAD stenosis of 75%, strongly positive RFR of 0.75, treated successfully with high-pressure noncompliant balloon angioplasty and stenting using a 2.75 x 26 mm Onyx frontier DES optimized with IVUS imaging. She was discharged on aspirin and plavix.  Her chest pain and dyspnea have completely resolved since PCI.   T1DM: continue insulin  HTN: Bp elevated. Continue Lasix 62m daily, Toprol XL 246mdaily. Given DMT1 and CKD stage III will add Losartan 2514maily. Check BMET today. I will see her back in 3 weeks for Bp monitoring.   Hx of breast cancer: in remission   Chronic diastolic heart failure: she appears euvolemic.  Continue Lasix 40 mg daily.     Renal cysts: MRI showed small renal cortical cysts are present. Recommend follow-up abdominopelvic CT without and with contrast in 3-6 months. Her oncologist has already ordered a renal [place] march. I will defer this to him   Pulmonary nodule: 3 mm right upper lobe pulmonary nodule, nonspecific, but statistically likely benign. No follow-up needed if patient is low-risk. She has already had this worked up and has been told this is benign.     Medication Adjustments/Labs and Tests  Ordered: Current medicines are reviewed at length with the patient today.  Concerns regarding medicines are outlined above.  Orders Placed This Encounter  Procedures   Basic metabolic panel   ECHOCARDIOGRAM COMPLETE   Meds ordered this encounter  Medications   losartan (COZAAR) 25 MG tablet    Sig: Take 1 tablet (25 mg total) by mouth daily.    Dispense:  90 tablet    Refill:  3    Patient Instructions  Medication Instructions:  Your physician has recommended you make  the following change in your medication:   START Losartan 25 mg taking 1 tablet daily    *If you need a refill on your cardiac medications before your next appointment, please call your pharmacy*   Lab Work: TODAY:  BMET   If you have labs (blood work) drawn today and your tests are completely normal, you will receive your results only by: [place] (if you have MyChart) OR A paper copy in the mail If you have any lab test that is abnormal or we need to change your treatment, we will call you to review the results.   Testing/Procedures: Your physician has requested that you have an echocardiogram 1 YEAR 12/08/22 arrive at 9:20.  Echocardiography is a painless test that uses sound waves to create images of your heart. It provides your doctor with information about the size and shape of your heart and how well your hearts chambers and valves are working. This procedure takes approximately one hour. There are no restrictions for this procedure.    Follow-Up: At [facility], you and your health needs are our priority.  As part of our continuing mission to provide you with exceptional heart care, we have created designated Provider Care Teams.  These Care Teams include your primary Cardiologist (physician) and Advanced Practice Providers (APPs -  Physician Assistants and Nurse Practitioners) who all work together to provide you with the care you need, when you need it.  We recommend signing up for the  patient portal called "MyChart".  Sign up information is provided on this After Visit Summary.  MyChart is used to connect with patients for Virtual Visits (Telemedicine).  Patients are able to view lab/test results, encounter notes, upcoming appointments, etc.  Non-urgent messages can be sent to your provider as well.   To learn more about what you can do with MyChart, go to [email or web address].    Your next appointment:   12/30/21 ARRIVE AT 10:45   12 month(s) 12/08/22 10:30 (after your Echocardiogram)  The format for your next appointment:   In Person  Provider:   Nell Range, PA-C    Other Instructions     Signed, Angelena Form, PA-C  12/09/2021 6:14 PM    [place]

## 2021-12-09 ENCOUNTER — Other Ambulatory Visit: Payer: Self-pay

## 2021-12-09 ENCOUNTER — Ambulatory Visit (HOSPITAL_COMMUNITY): Payer: Medicare HMO | Attending: Cardiology

## 2021-12-09 ENCOUNTER — Ambulatory Visit: Payer: Medicare HMO | Admitting: Physician Assistant

## 2021-12-09 VITALS — BP 150/60 | HR 84 | Ht 60.0 in | Wt 150.0 lb

## 2021-12-09 DIAGNOSIS — R911 Solitary pulmonary nodule: Secondary | ICD-10-CM

## 2021-12-09 DIAGNOSIS — Z952 Presence of prosthetic heart valve: Secondary | ICD-10-CM

## 2021-12-09 DIAGNOSIS — Z853 Personal history of malignant neoplasm of breast: Secondary | ICD-10-CM

## 2021-12-09 DIAGNOSIS — N281 Cyst of kidney, acquired: Secondary | ICD-10-CM

## 2021-12-09 DIAGNOSIS — I10 Essential (primary) hypertension: Secondary | ICD-10-CM

## 2021-12-09 DIAGNOSIS — I2510 Atherosclerotic heart disease of native coronary artery without angina pectoris: Secondary | ICD-10-CM

## 2021-12-09 DIAGNOSIS — E108 Type 1 diabetes mellitus with unspecified complications: Secondary | ICD-10-CM

## 2021-12-09 DIAGNOSIS — Z9861 Coronary angioplasty status: Secondary | ICD-10-CM

## 2021-12-09 LAB — ECHOCARDIOGRAM COMPLETE
AR max vel: 1.33 cm2
AV Area VTI: 1.37 cm2
AV Area mean vel: 1.32 cm2
AV Mean grad: 7 mmHg
AV Peak grad: 12 mmHg
Ao pk vel: 1.73 m/s
Area-P 1/2: 3.42 cm2
MV VTI: 1.04 cm2
S' Lateral: 2.3 cm

## 2021-12-09 LAB — BASIC METABOLIC PANEL
BUN/Creatinine Ratio: 18 (ref 12–28)
BUN: 26 mg/dL (ref 8–27)
CO2: 34 mmol/L — ABNORMAL HIGH (ref 20–29)
Calcium: 10.3 mg/dL (ref 8.7–10.3)
Chloride: 100 mmol/L (ref 96–106)
Creatinine, Ser: 1.44 mg/dL — ABNORMAL HIGH (ref 0.57–1.00)
Glucose: 79 mg/dL (ref 70–99)
Potassium: 4.4 mmol/L (ref 3.5–5.2)
Sodium: 145 mmol/L — ABNORMAL HIGH (ref 134–144)
eGFR: 39 mL/min/{1.73_m2} — ABNORMAL LOW (ref >59)

## 2021-12-09 MED ORDER — LOSARTAN POTASSIUM 25 MG PO TABS: 25.0000 mg | ORAL_TABLET | Freq: Every day | ORAL | 3 refills | Status: DC

## 2021-12-09 NOTE — Patient Instructions (Addendum)
Medication Instructions:  Your physician has recommended you make the following change in your medication:   START Losartan 25 mg taking 1 tablet daily    *If you need a refill on your cardiac medications before your next appointment, please call your pharmacy*   Lab Work: TODAY:  BMET   If you have labs (blood work) drawn today and your tests are completely normal, you will receive your results only by: [place] (if you have MyChart) OR A paper copy in the mail If you have any lab test that is abnormal or we need to change your treatment, we will call you to review the results.   Testing/Procedures: Your physician has requested that you have an echocardiogram 1 YEAR 12/08/22 arrive at 9:20.  Echocardiography is a painless test that uses sound waves to create images of your heart. It provides your doctor with information about the size and shape of your heart and how well your hearts chambers and valves are working. This procedure takes approximately one hour. There are no restrictions for this procedure.    Follow-Up: At [facility], you and your health needs are our priority.  As part of our continuing mission to provide you with exceptional heart care, we have created designated Provider Care Teams.  These Care Teams include your primary Cardiologist (physician) and Advanced Practice Providers (APPs -  Physician Assistants and Nurse Practitioners) who all work together to provide you with the care you need, when you need it.  We recommend signing up for the patient portal called "MyChart".  Sign up information is provided on this After Visit Summary.  MyChart is used to connect with patients for Virtual Visits (Telemedicine).  Patients are able to view lab/test results, encounter notes, upcoming appointments, etc.  Non-urgent messages can be sent to your provider as well.   To learn more about what you can do with MyChart, go to [email or web address].    Your next  appointment:   12/30/21 ARRIVE AT 10:45   12 month(s) 12/08/22 10:30 (after your Echocardiogram)  The format for your next appointment:   In Person  Provider:   Nell Range, PA-C    Other Instructions

## 2021-12-20 ENCOUNTER — Ambulatory Visit (HOSPITAL_BASED_OUTPATIENT_CLINIC_OR_DEPARTMENT_OTHER)
Admission: RE | Admit: 2021-12-20 | Discharge: 2021-12-20 | Disposition: A | Discharge status: Home / Self Care | Payer: Medicare HMO | Source: Ambulatory Visit | Attending: Internal Medicine | Admitting: Internal Medicine

## 2021-12-20 ENCOUNTER — Other Ambulatory Visit: Payer: Self-pay

## 2021-12-20 DIAGNOSIS — Z171 Estrogen receptor negative status [ER-]: Secondary | ICD-10-CM | POA: Diagnosis present

## 2021-12-20 DIAGNOSIS — N281 Cyst of kidney, acquired: Secondary | ICD-10-CM

## 2021-12-20 DIAGNOSIS — C50811 Malignant neoplasm of overlapping sites of right female breast: Secondary | ICD-10-CM | POA: Insufficient documentation

## 2021-12-27 ENCOUNTER — Ambulatory Visit (HOSPITAL_COMMUNITY): Payer: Medicare HMO

## 2021-12-28 ENCOUNTER — Inpatient Hospital Stay (HOSPITAL_BASED_OUTPATIENT_CLINIC_OR_DEPARTMENT_OTHER): Payer: Medicare HMO | Admitting: Internal Medicine

## 2021-12-28 ENCOUNTER — Other Ambulatory Visit: Payer: Self-pay

## 2021-12-28 ENCOUNTER — Encounter: Payer: Self-pay | Admitting: Internal Medicine

## 2021-12-28 ENCOUNTER — Inpatient Hospital Stay: Payer: Medicare HMO | Attending: Internal Medicine

## 2021-12-28 DIAGNOSIS — C50811 Malignant neoplasm of overlapping sites of right female breast: Secondary | ICD-10-CM | POA: Diagnosis present

## 2021-12-28 DIAGNOSIS — Z9013 Acquired absence of bilateral breasts and nipples: Secondary | ICD-10-CM | POA: Diagnosis not present

## 2021-12-28 DIAGNOSIS — Z79899 Other long term (current) drug therapy: Secondary | ICD-10-CM | POA: Diagnosis not present

## 2021-12-28 DIAGNOSIS — N183 Chronic kidney disease, stage 3 unspecified: Secondary | ICD-10-CM | POA: Diagnosis not present

## 2021-12-28 DIAGNOSIS — D631 Anemia in chronic kidney disease: Secondary | ICD-10-CM | POA: Insufficient documentation

## 2021-12-28 DIAGNOSIS — K219 Gastro-esophageal reflux disease without esophagitis: Secondary | ICD-10-CM | POA: Diagnosis not present

## 2021-12-28 DIAGNOSIS — Z803 Family history of malignant neoplasm of breast: Secondary | ICD-10-CM | POA: Diagnosis not present

## 2021-12-28 DIAGNOSIS — Z171 Estrogen receptor negative status [ER-]: Secondary | ICD-10-CM

## 2021-12-28 DIAGNOSIS — Z7982 Long term (current) use of aspirin: Secondary | ICD-10-CM | POA: Insufficient documentation

## 2021-12-28 DIAGNOSIS — I2510 Atherosclerotic heart disease of native coronary artery without angina pectoris: Secondary | ICD-10-CM | POA: Diagnosis not present

## 2021-12-28 DIAGNOSIS — I252 Old myocardial infarction: Secondary | ICD-10-CM | POA: Insufficient documentation

## 2021-12-28 DIAGNOSIS — N281 Cyst of kidney, acquired: Secondary | ICD-10-CM | POA: Diagnosis not present

## 2021-12-28 DIAGNOSIS — E1022 Type 1 diabetes mellitus with diabetic chronic kidney disease: Secondary | ICD-10-CM | POA: Diagnosis not present

## 2021-12-28 DIAGNOSIS — I129 Hypertensive chronic kidney disease with stage 1 through stage 4 chronic kidney disease, or unspecified chronic kidney disease: Secondary | ICD-10-CM | POA: Insufficient documentation

## 2021-12-28 DIAGNOSIS — Z800 Family history of malignant neoplasm of digestive organs: Secondary | ICD-10-CM | POA: Insufficient documentation

## 2021-12-28 DIAGNOSIS — I5032 Chronic diastolic (congestive) heart failure: Secondary | ICD-10-CM | POA: Insufficient documentation

## 2021-12-28 DIAGNOSIS — I130 Hypertensive heart and chronic kidney disease with heart failure and stage 1 through stage 4 chronic kidney disease, or unspecified chronic kidney disease: Secondary | ICD-10-CM | POA: Diagnosis not present

## 2021-12-28 DIAGNOSIS — Z853 Personal history of malignant neoplasm of breast: Secondary | ICD-10-CM | POA: Insufficient documentation

## 2021-12-28 DIAGNOSIS — I052 Rheumatic mitral stenosis with insufficiency: Secondary | ICD-10-CM | POA: Insufficient documentation

## 2021-12-28 DIAGNOSIS — E785 Hyperlipidemia, unspecified: Secondary | ICD-10-CM | POA: Diagnosis not present

## 2021-12-28 LAB — CBC WITH DIFFERENTIAL/PLATELET
Abs Immature Granulocytes: 0.02 10*3/uL (ref 0.00–0.07)
Basophils Absolute: 0 10*3/uL (ref 0.0–0.1)
Basophils Relative: 0 %
Eosinophils Absolute: 0.2 10*3/uL (ref 0.0–0.5)
Eosinophils Relative: 2 %
HCT: 35.4 % — ABNORMAL LOW (ref 36.0–46.0)
Hemoglobin: 11.4 g/dL — ABNORMAL LOW (ref 12.0–15.0)
Immature Granulocytes: 0 %
Lymphocytes Relative: 28 %
Lymphs Abs: 2 10*3/uL (ref 0.7–4.0)
MCH: 30.6 pg (ref 26.0–34.0)
MCHC: 32.2 g/dL (ref 30.0–36.0)
MCV: 95.2 fL (ref 80.0–100.0)
Monocytes Absolute: 0.7 10*3/uL (ref 0.1–1.0)
Monocytes Relative: 10 %
Neutro Abs: 4.2 10*3/uL (ref 1.7–7.7)
Neutrophils Relative %: 60 %
Platelets: 162 10*3/uL (ref 150–400)
RBC: 3.72 MIL/uL — ABNORMAL LOW (ref 3.87–5.11)
RDW: 13.4 % (ref 11.5–15.5)
WBC: 7.1 10*3/uL (ref 4.0–10.5)
nRBC: 0 % (ref 0.0–0.2)

## 2021-12-28 LAB — COMPREHENSIVE METABOLIC PANEL WITH GFR
ALT: 17 U/L (ref 0–44)
AST: 30 U/L (ref 15–41)
Albumin: 4.1 g/dL (ref 3.5–5.0)
Alkaline Phosphatase: 104 U/L (ref 38–126)
Anion gap: 9 (ref 5–15)
BUN: 34 mg/dL — ABNORMAL HIGH (ref 8–23)
CO2: 32 mmol/L (ref 22–32)
Calcium: 9.4 mg/dL (ref 8.9–10.3)
Chloride: 94 mmol/L — ABNORMAL LOW (ref 98–111)
Creatinine, Ser: 1.37 mg/dL — ABNORMAL HIGH (ref 0.44–1.00)
GFR, Estimated: 41 mL/min — ABNORMAL LOW
Glucose, Bld: 208 mg/dL — ABNORMAL HIGH (ref 70–99)
Potassium: 4.2 mmol/L (ref 3.5–5.1)
Sodium: 135 mmol/L (ref 135–145)
Total Bilirubin: 0.5 mg/dL (ref 0.3–1.2)
Total Protein: 7.8 g/dL (ref 6.5–8.1)

## 2021-12-28 NOTE — Progress Notes (Signed)
[place] ?CONSULT NOTE ? ?Patient Care Team: ?Ernestene Kiel, MD as PCP - General (Internal Medicine) ?Sherren Mocha, MD as PCP - Structural Heart (Cardiology) ?Sueanne Margarita, MD as PCP - Cardiology (Cardiology) ?Cammie Sickle, MD as Consulting Physician (Hematology and Oncology) ? ?CHIEF COMPLAINTS/PURPOSE OF CONSULTATION: breast cancer ? ?#  ?Oncology History Overview Note  ?#September 2016-multifocal right breast cancer triple negative [ER/PR HER-2 negative; [facility]]-PET-no metastatic disease; dose dense Adriamycin followed by weekly Taxol.  November 05, 2013-right mastectomy- [id number]-complete pathologic response.  status post adjuvant right ax.radiation Left breast cancer- as pt/reocds s/p mastectomy-no malignancy noted.   ? ?# DM- on insulin [since 4034] ? ?# SURVIVORSHIP:  ? ?# GENETICS: NEG as per pt.  ? ?DIAGNOSIS: Right breast cancer ? ?STAGE: early stage        ;  GOALS: cure ? ?CURRENT/MOST RECENT THERAPY : Surveillance ? ? ? ?  ?Carcinoma of overlapping sites of right breast in female, estrogen receptor negative ([place])  ?12/24/2019 Initial Diagnosis  ? Carcinoma of overlapping sites of right breast in female, estrogen receptor negative ([place]) ?  ? ? ? ?HISTORY OF PRESENTING ILLNESS: Ambulating independently.  Alone. ?Molly Hill 71 y.o.  female above history of bilateral triple negative breast cancer is here for follow-up/kidney ultrasound. ? ?In the interim patient had MI status post stenting.  Also had TAVR procedure done interim.  ? ?Patient notes to have her breathing improved with exertion. No abdominal pain.  No constipation.  ? ?Review of Systems  ?Constitutional:  Positive for malaise/fatigue. Negative for chills, diaphoresis, fever and weight loss.  ?HENT:  Negative for nosebleeds and sore throat.   ?Eyes:  Negative for double vision.  ?Respiratory:  Negative for cough, hemoptysis, sputum production, shortness of breath and wheezing.    ?Cardiovascular:  Negative for chest pain, palpitations, orthopnea and leg swelling.  ?Gastrointestinal:  Negative for abdominal pain, blood in stool, constipation, diarrhea, heartburn, melena, nausea and vomiting.  ?Genitourinary:  Negative for dysuria, frequency and urgency.  ?Musculoskeletal:  Positive for joint pain. Negative for back pain.  ?Skin: Negative.  Negative for itching and [place].  ?Neurological:  Negative for dizziness, tingling, focal weakness, weakness and headaches.  ?Endo/Heme/Allergies:  Does not bruise/bleed easily.  ?Psychiatric/Behavioral:  Negative for depression. The patient is not nervous/anxious and does not have insomnia.    ? ?MEDICAL HISTORY:  ?Past Medical History:  ?Diagnosis Date  ? Alopecia   ? Anemia   ? Arthritis   ? Bicuspid aortic valve   ? mild to moderate AS by echo 2022  ? Chronic diastolic (congestive) heart failure (HCC)   ? Coronary arteriosclerosis in native artery   ? s/p PCI of LAD in 2013 & NSTEMI with staged DES to pLAD (10/2021)  ? DM (diabetes mellitus), type 1 ([place])   ? GERD (gastroesophageal reflux disease)   ? History of breast cancer   ? Estrogen receptor negative. s/p chemo  ? Hyperglycemia   ? Hyperlipidemia   ? Hypertension   ? Mitral regurgitation   ? mild to moderate by echo 2020  ? Myocardial infarction ([place]) 10/09/2021  ? S/P TAVR (transcatheter aortic valve replacement) 11/08/2021  ? s/p TAVR with a 23 mm Edwards S3UR via the TF approach by Dr. Burt Knack and Dr. Cyndia Bent  ? Tendinitis of left wrist   ? ? ?SURGICAL HISTORY: ?Past Surgical History:  ?Procedure Laterality Date  ? ANKLE SURGERY Bilateral   ? CATARACT EXTRACTION, BILATERAL    ?  CHOLECYSTECTOMY    ? CORONARY STENT INTERVENTION    ? CORONARY STENT INTERVENTION N/A 10/21/2021  ? Procedure: CORONARY STENT INTERVENTION;  Surgeon: Sherren Mocha, MD;  Location: [place] [facility];  Service: Cardiovascular;  Laterality: N/A;  ? CORONARY/GRAFT ACUTE MI REVASCULARIZATION N/A 10/10/2021  ? Procedure:  CORONARY/GRAFT ACUTE MI REVASCULARIZATION;  Surgeon: Lorretta Harp, MD;  Location: [place] [facility];  Service: Cardiovascular;  Laterality: N/A;  ? CTR    ? ELBOW SURGERY    ? HAND SURGERY    ? BOTH THUMBS AND MIDDLE FINGERS  ? INTRAOPERATIVE TRANSTHORACIC ECHOCARDIOGRAM N/A 11/08/2021  ? Procedure: INTRAOPERATIVE TRANSTHORACIC ECHOCARDIOGRAM;  Surgeon: Sherren Mocha, MD;  Location: [place] [facility];  Service: Open Heart Surgery;  Laterality: N/A;  ? INTRAVASCULAR PRESSURE WIRE/FFR STUDY N/A 10/21/2021  ? Procedure: INTRAVASCULAR PRESSURE WIRE/FFR STUDY;  Surgeon: Sherren Mocha, MD;  Location: [place] [facility];  Service: Cardiovascular;  Laterality: N/A;  ? LEFT BREAST MASTECTOMY    ? MASCULAR  REPAIR    ? PORT-A-CATH REMOVAL    ? PORTACATH PLACEMENT    ? RIGHT MODIFIED MASTECTOMY Right   ? TRANSCATHETER AORTIC VALVE REPLACEMENT, TRANSFEMORAL N/A 11/08/2021  ? Procedure: TRANSCATHETER AORTIC VALVE REPLACEMENT, TRANSFEMORAL;  Surgeon: Sherren Mocha, MD;  Location: [place] [facility];  Service: Open Heart Surgery;  Laterality: N/A;  ? UPPER LIP SURGERY    ? ? ?SOCIAL HISTORY: ?Social History  ? ?Socioeconomic History  ? Marital status: Widowed  ?  Spouse name: Not on file  ? Number of children: Not on file  ? Years of education: Not on file  ? Highest education level: Not on file  ?Occupational History  ? Not on file  ?Tobacco Use  ? Smoking status: Former  ?  Years: 3.00  ?  Types: Cigarettes  ?  Quit date: 08/15/1979  ?  Years since quitting: 42.4  ? Smokeless tobacco: Never  ? Tobacco comments:  ?  1 pack would last a week.  ?Vaping Use  ? Vaping Use: Never used  ?Substance and Sexual Activity  ? Alcohol use: Never  ? Drug use: Never  ? Sexual activity: Never  ?Other Topics Concern  ? Not on file  ?Social History Narrative  ? Not on file  ? ?Social Determinants of Health  ? ?Financial Resource Strain: Not on file  ?Food Insecurity: Not on file  ?Transportation Needs: Not on file  ?Physical Activity: Not  on file  ?Stress: Not on file  ?Social Connections: Not on file  ?Intimate Partner Violence: Not on file  ? ? ?FAMILY HISTORY: ?Family History  ?Problem Relation Age of Onset  ? Heart attack Mother   ? CAD Father   ?     CABG  ? Diabetes Father   ? Stroke Father   ? Colon cancer Father   ?     70s  ? Diabetes Brother   ? Diabetes Paternal Grandfather   ? Heart attack Paternal Grandfather   ? Breast cancer Maternal Aunt   ?     34s  ? ? ?ALLERGIES:  is allergic to codeine, onion, and reglan [metoclopramide]. ? ?MEDICATIONS:  ?Current Outpatient Medications  ?Medication Sig Dispense Refill  ? acetaminophen (TYLENOL) 500 MG tablet Take 1,000 mg by mouth every 6 (six) hours as needed for moderate pain or headache.    ? amLODipine (NORVASC) 2.5 MG tablet Take 2.5 mg by mouth daily.    ? aspirin EC 81 MG tablet Take 81  mg by mouth at bedtime.    ? atorvastatin (LIPITOR) 80 MG tablet Take 1 tablet (80 mg total) by mouth at bedtime. 90 tablet 3  ? BIOTIN PO Take by mouth daily.    ? Biotin w/ Vitamins C & E (HAIR/SKIN/NAILS PO) Take 1 capsule by mouth daily.    ? bisacodyl (DULCOLAX) 5 MG EC tablet Take 2 tablets (10 mg total) by mouth daily as needed for moderate constipation. 30 tablet 0  ? Calcium Carb-Cholecalciferol (CALCIUM 600 + D PO) Take 1 tablet by mouth daily.    ? cholecalciferol (VITAMIN D) 25 MCG (1000 UNIT) tablet Take 1,000 Units by mouth daily.    ? clopidogrel (PLAVIX) 75 MG tablet Take 1 tablet (75 mg total) by mouth daily. 90 tablet 3  ? docusate sodium (COLACE) 100 MG capsule Take 2 capsules (200 mg total) by mouth daily. 10 capsule 0  ? fexofenadine (ALLEGRA) 180 MG tablet Take 180 mg by mouth daily as needed for allergies.    ? fluticasone (FLONASE) 50 MCG/ACT nasal spray Place 1 spray into both nostrils as needed for allergies.    ? furosemide (LASIX) 40 MG tablet Take 1 tablet (40 mg total) by mouth daily. 90 tablet 3  ? glucose blood test strip 1 each by Other route 4 (four) times daily -  before  meals and at bedtime. Use as instructed    ? insulin NPH Human (NOVOLIN N) 100 UNIT/ML injection Inject 18 Units into the skin 2 (two) times daily before a meal.    ? insulin regular (NOVOLIN R) 100 units/mL in

## 2021-12-28 NOTE — Progress Notes (Signed)
Can she take melatonin since she had a heart attack 10/09/2021? ? ? ?

## 2021-12-28 NOTE — Assessment & Plan Note (Addendum)
Bilateral breast cancer triple negative [September 2016]-s/p BIL Mastectomy. stable.  No evidence of recurrence.  Continue surveillance. STABLE.  ? ?# FEB 2022- Incidental right kidney1.3 cm complex cyst.  A similar but slightly smaller size cyst noted on the CT scan from 2009.  MARCH 2023- [place]- STABLE; no further follow up imaging.  ? ?# Anemia mild hemoglobin 11-12/? Sec to CKD- III-stable.  ? ?# CKD- stage III- GFR 45 [DM-Endo/HTN]-discussed with patient the importance of optimal control of blood pressure/blood glucose levels.  STABLE.  ? ?# # CAD/MI [dec, 1478]; TAVR [[place]- Jan 2023] ? ?# DISPOSITION:  ?# Follow up in 12 months MD; labs- cbc/cmp; - Dr.B ? ? ?

## 2021-12-30 ENCOUNTER — Ambulatory Visit: Payer: Medicare HMO

## 2022-01-02 ENCOUNTER — Encounter: Payer: Medicare HMO | Attending: Cardiology | Admitting: *Deleted

## 2022-01-02 ENCOUNTER — Encounter (HOSPITAL_COMMUNITY): Payer: Self-pay | Admitting: Cardiovascular Disease

## 2022-01-02 ENCOUNTER — Other Ambulatory Visit: Payer: Self-pay

## 2022-01-02 DIAGNOSIS — Z48812 Encounter for surgical aftercare following surgery on the circulatory system: Secondary | ICD-10-CM | POA: Insufficient documentation

## 2022-01-02 DIAGNOSIS — Z952 Presence of prosthetic heart valve: Secondary | ICD-10-CM | POA: Insufficient documentation

## 2022-01-02 DIAGNOSIS — E119 Type 2 diabetes mellitus without complications: Secondary | ICD-10-CM | POA: Diagnosis not present

## 2022-01-02 LAB — GLUCOSE, CAPILLARY
Glucose-Capillary: 160 mg/dL — ABNORMAL HIGH (ref 70–99)
Glucose-Capillary: 76 mg/dL (ref 70–99)

## 2022-01-02 NOTE — Progress Notes (Signed)
Daily Session Note ? ?Patient Details  ?Name: Molly Hill ?MRN: [id number] ?Date of Birth: 12/31/1950 ?Referring Provider:   ?Flowsheet Row Cardiac Rehab from 12/05/2021 in [facility]  ?Referring Provider Dr. Traci Turner  ? ?  ? ? ?Encounter Date: 01/02/2022 ? ?Check In: ? Session Check In - 01/02/22 1056   ? ?  ? Check-In  ? Supervising physician immediately available to respond to emergencies See telemetry face sheet for immediately available ER MD   ? Location [facility]iac & [facility]   ? Staff Present Meredith Craven, RN BSN;Kelly Hayes, BS, ACSM CEP, Exercise Physiologist;Kelly Bollinger, MPA, RN;Jessica Hawkins, MA, RCEP, CCRP, CCET   ? Virtual Visit No   ? Medication changes reported     No   ? Fall or balance concerns reported    No   ? Warm-up and Cool-down Performed on first and last piece of equipment   ? Resistance Training Performed Yes   ? VAD Patient? No   ? PAD/SET Patient? No   ?  ? Pain Assessment  ? Currently in Pain? No/denies   ? ?  ?  ? ?  ? ? ? ? ? ?Social History  ? ?Tobacco Use  ?Smoking Status Former  ? Years: 3.00  ? Types: Cigarettes  ? Quit date: 08/15/1979  ? Years since quitting: 42.4  ?Smokeless Tobacco Never  ?Tobacco Comments  ? 1 pack would last a week.  ? ? ?Goals Met:  ?Independence with exercise equipment ?Exercise tolerated well ?No report of concerns or symptoms today ?Strength training completed today ? ?Goals Unmet:  ?Not Applicable ? ?Comments: First full day of exercise!  Patient was oriented to gym and equipment including functions, settings, policies, and procedures.  Patient's individual exercise prescription and treatment plan were reviewed.  All starting workloads were established based on the results of the 6 minute walk test done at initial orientation visit.  The plan for exercise progression was also introduced and progression will be customized based on patient's performance and goals. ? ? ? ?Dr. Mark Miller is Medical Director for  [facility].  ?Dr. Fuad Aleskerov is Medical Director for [facility]. ?

## 2022-01-02 NOTE — Progress Notes (Signed)
?[facility]   ?[place] ?                                    ?Cardiology Office Note:   ? ?Date:  01/04/2022  ? ?ID:  Molly Hill, DOB 1951-03-14, MRN [id number] ? ?PCP:  Ernestene Kiel, MD  ?[facility] Cardiologist:  Kirk Ruths, MD / Dr. Burt Knack & Dr. Cyndia Bent (TAVR) ?[place] Electrophysiologist:  None  ? ?Referring MD: Ernestene Kiel, MD  ? ?Follow up BP ? ?History of Present Illness:   ? ?Molly Hill is a 71 y.o. female with a hx of breast CA s/p radiation to the right and bilateral mastectomy, DMT1, HTN, HLD, tobacco abuse, anemia, recent admission for NSTEMI and acute CHF in the setting of E.Coli bacteremia and pyelonephritis (09/2021), CAD s/p remote PCI of LAD in 2013 and NSTEMI with staged DES to pLAD (10/21/21), bicuspid AoV disease with severe AS/mild AI s/p TAVR (11/08/21) who presents to clinic for follow up.  ? ?The patient had recently noticed decreased exercise tolerance, fatigue, DOE and chest pain. She was admitted 12/25-12/30/22 for chest pain and above symptoms. She was found to have an NSTEMI and acute CHF in the setting of E.Coli bacteremia and pyelonephritis.  Echo showed LVEF 60-65% with severe aortic stenosis with a mean gradient of 40 mm hg, AVA 0.61 and DVI of 0.21. Cardiac catheterization 12/26 revealed 75% ostial right coronary artery, 90% RP AV and 40% ostial to proximal LAD; left ventricular end-diastolic pressure 8 mmHg. She was evaluated for [facility] but felt to be too high risk given radiation changes in the right apex along with ascending aorta calcifications extending from the root to the arch felt to be secondary to chest radiation. She underwent pre TAVR scans while admitted. Plan was for discharge home with ABx and to return for staged PCI and FFR followed by eventual TAVR. ?  ?Staged cath 10/21/21 showed negative FFR evaluation of the RCA, moderate nonobstructive lesion and severe flow-limiting diffuse calcific  proximal LAD stenosis of 75%, strongly positive RFR of 0.75, treated successfully with high-pressure noncompliant balloon angioplasty and stenting using a 2.75 x 26 mm Onyx frontier DES optimized with IVUS imaging. She was discharged on aspirin and plavix. ?  ?She underwent successful TAVR with a 23 mm Edwards Sapien 3 Ultra Resilia THV via the TF approach on 11/08/21. One CC was removed with deployment. Post operative echo showed EF 65%, normally functioning TAVR with a mean gradient of 7 mmHg and no PVL and mild mitral stenosis. She was discharged on continued aspirin and Plavix. At follow up BP was elevated and Losartan started.  ? ?Today the patient presents to clinic for follow up. Here alone. BP much better controlled on Losartan. No CP or SOB. No LE edema, orthopnea or PND. Has had some dizziness that she thinks is related to vertigo but no syncope. No blood in stool or urine. No palpitations.  ? ? ?Past Medical History:  ?Diagnosis Date  ? Alopecia   ? Anemia   ? Arthritis   ? Bicuspid aortic valve   ? mild to moderate AS by echo 2022  ? Chronic diastolic (congestive) heart failure (HCC)   ? Coronary arteriosclerosis in native artery   ? s/p PCI of LAD in 2013 & NSTEMI with staged DES to pLAD (10/2021)  ? DM (diabetes mellitus), type 1 ([place])   ?  GERD (gastroesophageal reflux disease)   ? History of breast cancer   ? Estrogen receptor negative. s/p chemo  ? Hyperglycemia   ? Hyperlipidemia   ? Hypertension   ? Mitral regurgitation   ? mild to moderate by echo 2020  ? Myocardial infarction ([place]) 10/09/2021  ? S/P TAVR (transcatheter aortic valve replacement) 11/08/2021  ? s/p TAVR with a 23 mm Edwards S3UR via the TF approach by Dr. Burt Knack and Dr. Cyndia Bent  ? Tendinitis of left wrist   ? ? ?Past Surgical History:  ?Procedure Laterality Date  ? ANKLE SURGERY Bilateral   ? CATARACT EXTRACTION, BILATERAL    ? CHOLECYSTECTOMY    ? CORONARY STENT INTERVENTION    ? CORONARY STENT INTERVENTION N/A 10/21/2021  ? Procedure:  CORONARY STENT INTERVENTION;  Surgeon: Sherren Mocha, MD;  Location: [place] [facility];  Service: Cardiovascular;  Laterality: N/A;  ? CORONARY/GRAFT ACUTE MI REVASCULARIZATION N/A 10/10/2021  ? Procedure: CORONARY/GRAFT ACUTE MI REVASCULARIZATION;  Surgeon: Lorretta Harp, MD;  Location: [place] [facility];  Service: Cardiovascular;  Laterality: N/A;  ? CTR    ? ELBOW SURGERY    ? HAND SURGERY    ? BOTH THUMBS AND MIDDLE FINGERS  ? INTRAOPERATIVE TRANSTHORACIC ECHOCARDIOGRAM N/A 11/08/2021  ? Procedure: INTRAOPERATIVE TRANSTHORACIC ECHOCARDIOGRAM;  Surgeon: Sherren Mocha, MD;  Location: [place] [facility];  Service: Open Heart Surgery;  Laterality: N/A;  ? INTRAVASCULAR PRESSURE WIRE/FFR STUDY N/A 10/21/2021  ? Procedure: INTRAVASCULAR PRESSURE WIRE/FFR STUDY;  Surgeon: Sherren Mocha, MD;  Location: [place] [facility];  Service: Cardiovascular;  Laterality: N/A;  ? LEFT BREAST MASTECTOMY    ? MASCULAR  REPAIR    ? PORT-A-CATH REMOVAL    ? PORTACATH PLACEMENT    ? RIGHT MODIFIED MASTECTOMY Right   ? TRANSCATHETER AORTIC VALVE REPLACEMENT, TRANSFEMORAL N/A 11/08/2021  ? Procedure: TRANSCATHETER AORTIC VALVE REPLACEMENT, TRANSFEMORAL;  Surgeon: Sherren Mocha, MD;  Location: [place] [facility];  Service: Open Heart Surgery;  Laterality: N/A;  ? UPPER LIP SURGERY    ? ? ?Current Medications: ?Current Meds  ?Medication Sig  ? acetaminophen (TYLENOL) 500 MG tablet Take 1,000 mg by mouth every 6 (six) hours as needed for moderate pain or headache.  ? amLODipine (NORVASC) 2.5 MG tablet Take 2.5 mg by mouth daily.  ? amoxicillin (AMOXIL) 500 MG capsule Take 4 capsules by mouth 1 hour prior to dental appointments  ? aspirin EC 81 MG tablet Take 81 mg by mouth at bedtime.  ? atorvastatin (LIPITOR) 80 MG tablet Take 1 tablet (80 mg total) by mouth at bedtime.  ? BIOTIN PO Take by mouth daily.  ? Biotin w/ Vitamins C & E (HAIR/SKIN/NAILS PO) Take 1 capsule by mouth daily.  ? bisacodyl (DULCOLAX) 5 MG EC tablet Take 2  tablets (10 mg total) by mouth daily as needed for moderate constipation.  ? Calcium Carb-Cholecalciferol (CALCIUM 600 + D PO) Take 1 tablet by mouth daily.  ? cholecalciferol (VITAMIN D) 25 MCG (1000 UNIT) tablet Take 1,000 Units by mouth daily.  ? clopidogrel (PLAVIX) 75 MG tablet Take 1 tablet (75 mg total) by mouth daily.  ? docusate sodium (COLACE) 100 MG capsule Take 2 capsules (200 mg total) by mouth daily.  ? fexofenadine (ALLEGRA) 180 MG tablet Take 180 mg by mouth daily as needed for allergies.  ? fluticasone (FLONASE) 50 MCG/ACT nasal spray Place 1 spray into both nostrils as needed for allergies.  ? furosemide (LASIX) 40 MG tablet Take 1 tablet (  40 mg total) by mouth daily.  ? glucose blood test strip 1 each by Other route 4 (four) times daily -  before meals and at bedtime. Use as instructed  ? insulin NPH Human (NOVOLIN N) 100 UNIT/ML injection Inject 18 Units into the skin 2 (two) times daily before a meal.  ? insulin regular (NOVOLIN R) 100 units/mL injection Inject 4-9 Units into the skin 3 (three) times daily before meals.  ? loperamide (IMODIUM A-D) 2 MG tablet Take 1-2 mg by mouth 4 (four) times daily as needed for diarrhea or loose stools.  ? losartan (COZAAR) 25 MG tablet Take 1 tablet (25 mg total) by mouth daily.  ? metoprolol succinate (TOPROL XL) 25 MG 24 hr tablet Take 1 tablet (25 mg total) by mouth daily.  ? Naphazoline-Pheniramine (EYE ALLERGY RELIEF OP) Place 1 drop into both eyes daily as needed (allergies).  ? nitroGLYCERIN (NITROSTAT) 0.4 MG SL tablet Place 1 tablet (0.4 mg total) under the tongue every 5 (five) minutes as needed for chest pain.  ? vitamin B-12 (CYANOCOBALAMIN) 1000 MCG tablet Take 1,000 mcg by mouth daily.  ? vitamin C (ASCORBIC ACID) 500 MG tablet Take 500 mg by mouth daily.  ? Zinc 50 MG CAPS Take 50 mg by mouth daily.  ?  ? ?Allergies:   Codeine, Onion, and Reglan [metoclopramide]  ? ?Social History  ? ?Socioeconomic History  ? Marital status: Widowed  ?  Spouse  name: Not on file  ? Number of children: Not on file  ? Years of education: Not on file  ? Highest education level: Not on file  ?Occupational History  ? Not on file  ?Tobacco Use  ? Smoking status: Former

## 2022-01-04 ENCOUNTER — Other Ambulatory Visit: Payer: Self-pay

## 2022-01-04 ENCOUNTER — Ambulatory Visit: Payer: Medicare HMO | Admitting: Physician Assistant

## 2022-01-04 ENCOUNTER — Encounter: Payer: Self-pay | Admitting: *Deleted

## 2022-01-04 VITALS — BP 120/64 | HR 71 | Ht 60.0 in | Wt 151.0 lb

## 2022-01-04 DIAGNOSIS — I10 Essential (primary) hypertension: Secondary | ICD-10-CM

## 2022-01-04 DIAGNOSIS — I2510 Atherosclerotic heart disease of native coronary artery without angina pectoris: Secondary | ICD-10-CM

## 2022-01-04 DIAGNOSIS — Z952 Presence of prosthetic heart valve: Secondary | ICD-10-CM

## 2022-01-04 DIAGNOSIS — E108 Type 1 diabetes mellitus with unspecified complications: Secondary | ICD-10-CM

## 2022-01-04 DIAGNOSIS — I5032 Chronic diastolic (congestive) heart failure: Secondary | ICD-10-CM

## 2022-01-04 DIAGNOSIS — Z48812 Encounter for surgical aftercare following surgery on the circulatory system: Secondary | ICD-10-CM | POA: Diagnosis not present

## 2022-01-04 DIAGNOSIS — Z853 Personal history of malignant neoplasm of breast: Secondary | ICD-10-CM

## 2022-01-04 DIAGNOSIS — Z9861 Coronary angioplasty status: Secondary | ICD-10-CM

## 2022-01-04 LAB — GLUCOSE, CAPILLARY
Glucose-Capillary: 78 mg/dL (ref 70–99)
Glucose-Capillary: 81 mg/dL (ref 70–99)
Glucose-Capillary: 83 mg/dL (ref 70–99)

## 2022-01-04 NOTE — Patient Instructions (Signed)

## 2022-01-04 NOTE — Progress Notes (Signed)
Incomplete Session Note ? ?Patient Details  ?Name: Molly Hill ?MRN: [id number] ?Date of Birth: 1951/06/03 ?Referring Provider:   ?Flowsheet Row Cardiac Rehab from 12/05/2021 in [facility]  ?Referring Provider Dr. Fransico Him  ? ?  ? ? ?Encounter Date: 01/04/2022 ? ?Check In: ? Session Check In - 01/04/22 0950   ? ?  ? Check-In  ? Supervising physician immediately available to respond to emergencies See telemetry face sheet for immediately available ER MD   ? Location [facility]   ? Staff Present Birdie Sons, MPA, RN;Joseph Emmett, RCP,RRT,BSRT;Melissa Allensworth, RDN, LDN   ? Virtual Visit No   ? Medication changes reported     No   ? Fall or balance concerns reported    No   ? Warm-up and Cool-down Not performed (comment)   BG below parameters to exercise  ? Resistance Training Performed No   ? VAD Patient? No   ? PAD/SET Patient? No   ?  ? Pain Assessment  ? Currently in Pain? No/denies   ? ?  ?  ? ?  ? ? ? ? ? ?Social History  ? ?Tobacco Use  ?Smoking Status Former  ? Years: 3.00  ? Types: Cigarettes  ? Quit date: 08/15/1979  ? Years since quitting: 42.4  ?Smokeless Tobacco Never  ?Tobacco Comments  ? 1 pack would last a week.  ? ? ? ? ?Comments: Pt unable to exercise today due to blood glucose level being below safe parameters to exercise (BG 83, given orange juice, reck BG 81). Patient stated understanding and will return for next scheduled visit.  ? ? ? ?Dr. Emily Filbert is Medical Director for [place].  ?Dr. Ottie Glazier is Medical Director for [facility]. ?

## 2022-01-04 NOTE — Progress Notes (Signed)
Cardiac Individual Treatment Plan ? ?Patient Details  ?Name: Molly Hill ?MRN: [id number] ?Date of Birth: 11/04/1950 ?Referring Provider:   ?Flowsheet Row Cardiac Rehab from 12/05/2021 in [facility] Cardiac and Pulmonary Rehab  ?Referring Provider Dr. Fransico Him  ? ?  ? ? ?Initial Encounter Date:  ?Flowsheet Row Cardiac Rehab from 12/05/2021 in [facility] Cardiac and Pulmonary Rehab  ?Date 12/05/21  ? ?  ? ? ?Visit Diagnosis: S/P TAVR (transcatheter aortic valve replacement) ? ?Patient's Home Medications on Admission: ? ?Current Outpatient Medications:  ?  acetaminophen (TYLENOL) 500 MG tablet, Take 1,000 mg by mouth every 6 (six) hours as needed for moderate pain or headache., Disp: , Rfl:  ?  amLODipine (NORVASC) 2.5 MG tablet, Take 2.5 mg by mouth daily., Disp: , Rfl:  ?  amoxicillin (AMOXIL) 500 MG capsule, Take 4 capsules by mouth 1 hour prior to dental appointments (Patient not taking: Reported on 12/28/2021), Disp: 4 capsule, Rfl: 3 ?  aspirin EC 81 MG tablet, Take 81 mg by mouth at bedtime., Disp: , Rfl:  ?  atorvastatin (LIPITOR) 80 MG tablet, Take 1 tablet (80 mg total) by mouth at bedtime., Disp: 90 tablet, Rfl: 3 ?  BIOTIN PO, Take by mouth daily., Disp: , Rfl:  ?  Biotin w/ Vitamins C & E (HAIR/SKIN/NAILS PO), Take 1 capsule by mouth daily., Disp: , Rfl:  ?  bisacodyl (DULCOLAX) 5 MG EC tablet, Take 2 tablets (10 mg total) by mouth daily as needed for moderate constipation., Disp: 30 tablet, Rfl: 0 ?  Calcium Carb-Cholecalciferol (CALCIUM 600 + D PO), Take 1 tablet by mouth daily., Disp: , Rfl:  ?  cholecalciferol (VITAMIN D) 25 MCG (1000 UNIT) tablet, Take 1,000 Units by mouth daily., Disp: , Rfl:  ?  clopidogrel (PLAVIX) 75 MG tablet, Take 1 tablet (75 mg total) by mouth daily., Disp: 90 tablet, Rfl: 3 ?  docusate sodium (COLACE) 100 MG capsule, Take 2 capsules (200 mg total) by mouth daily., Disp: 10 capsule, Rfl: 0 ?  fexofenadine (ALLEGRA) 180 MG tablet, Take 180 mg by mouth daily as needed for allergies.,  Disp: , Rfl:  ?  fluticasone (FLONASE) 50 MCG/ACT nasal spray, Place 1 spray into both nostrils as needed for allergies., Disp: , Rfl:  ?  furosemide (LASIX) 40 MG tablet, Take 1 tablet (40 mg total) by mouth daily., Disp: 90 tablet, Rfl: 3 ?  glucose blood test strip, 1 each by Other route 4 (four) times daily -  before meals and at bedtime. Use as instructed, Disp: , Rfl:  ?  insulin NPH Human (NOVOLIN N) 100 UNIT/ML injection, Inject 18 Units into the skin 2 (two) times daily before a meal., Disp: , Rfl:  ?  insulin regular (NOVOLIN R) 100 units/mL injection, Inject 4-9 Units into the skin 3 (three) times daily before meals., Disp: , Rfl:  ?  loperamide (IMODIUM A-D) 2 MG tablet, Take 1-2 mg by mouth 4 (four) times daily as needed for diarrhea or loose stools., Disp: , Rfl:  ?  losartan (COZAAR) 25 MG tablet, Take 1 tablet (25 mg total) by mouth daily., Disp: 90 tablet, Rfl: 3 ?  metoprolol succinate (TOPROL XL) 25 MG 24 hr tablet, Take 1 tablet (25 mg total) by mouth daily., Disp: 90 tablet, Rfl: 3 ?  Naphazoline-Pheniramine (EYE ALLERGY RELIEF OP), Place 1 drop into both eyes daily as needed (allergies)., Disp: , Rfl:  ?  nitroGLYCERIN (NITROSTAT) 0.4 MG SL tablet, Place 1 tablet (0.4 mg total) under  the tongue every 5 (five) minutes as needed for chest pain., Disp: 25 tablet, Rfl: 3 ?  vitamin B-12 (CYANOCOBALAMIN) 1000 MCG tablet, Take 1,000 mcg by mouth daily., Disp: , Rfl:  ?  vitamin C (ASCORBIC ACID) 500 MG tablet, Take 500 mg by mouth daily., Disp: , Rfl:  ?  Zinc 50 MG CAPS, Take 50 mg by mouth daily., Disp: , Rfl:  ? ?Past Medical History: ?Past Medical History:  ?Diagnosis Date  ? Alopecia   ? Anemia   ? Arthritis   ? Bicuspid aortic valve   ? mild to moderate AS by echo 2022  ? Chronic diastolic (congestive) heart failure (HCC)   ? Coronary arteriosclerosis in native artery   ? s/p PCI of LAD in 2013 & NSTEMI with staged DES to pLAD (10/2021)  ? DM (diabetes mellitus), type 1 ([place])   ? GERD  (gastroesophageal reflux disease)   ? History of breast cancer   ? Estrogen receptor negative. s/p chemo  ? Hyperglycemia   ? Hyperlipidemia   ? Hypertension   ? Mitral regurgitation   ? mild to moderate by echo 2020  ? Myocardial infarction ([place]) 10/09/2021  ? S/P TAVR (transcatheter aortic valve replacement) 11/08/2021  ? s/p TAVR with a 23 mm Edwards S3UR via the TF approach by Dr. Burt Knack and Dr. Cyndia Bent  ? Tendinitis of left wrist   ? ? ?Tobacco Use: ?Social History  ? ?Tobacco Use  ?Smoking Status Former  ? Years: 3.00  ? Types: Cigarettes  ? Quit date: 08/15/1979  ? Years since quitting: 42.4  ?Smokeless Tobacco Never  ?Tobacco Comments  ? 1 pack would last a week.  ? ? ?Labs: ?Review Flowsheet   ? ?  ?  Latest Ref Rng & Units 10/09/2021 10/10/2021 10/11/2021 11/04/2021  ?Labs for ITP Cardiac and Pulmonary Rehab  ?Cholestrol 0 - 200 mg/dL 107   99      ?LDL (calc) 0 - 99 mg/dL 48   36      ?HDL-C >40 mg/dL 41   39      ?Trlycerides <150 mg/dL 88   122      ?Hemoglobin A1c 4.8 - 5.6 % 7.8       ?PH, Arterial 7.350 - 7.450   7.448   7.419    ?PCO2 arterial 32.0 - 48.0 mmHg   37.7   44.9    ?Bicarbonate 20.0 - 28.0 mmol/L   25.9   28.5    ?TCO2 22 - 32 mmol/L   27     ?O2 Saturation %   93.0   98.2    ? ?  11/08/2021  ?Labs for ITP Cardiac and Pulmonary Rehab  ?Cholestrol   ?LDL (calc)   ?HDL-C   ?Trlycerides   ?Hemoglobin A1c   ?PH, Arterial   ?PCO2 arterial   ?Bicarbonate   ?TCO2 27    ? 29    ?O2 Saturation   ?  ? ? Multiple values from one day are sorted in reverse-chronological order  ?  ?  ? ? ? ?Exercise Target Goals: ?Exercise Program Goal: ?Individual exercise prescription set using results from initial 6 min walk test and THRR while considering  patient?s activity barriers and safety.  ? ?Exercise Prescription Goal: ?Initial exercise prescription builds to 30-45 minutes a day of aerobic activity, 2-3 days per week.  Home exercise guidelines will be given to patient during program as part of exercise  prescription that the participant will acknowledge. ? ? ?  Education: Aerobic Exercise: ?- Group verbal and visual presentation on the components of exercise prescription. Introduces F.I.T.T principle from ACSM for exercise prescriptions.  Reviews F.I.T.T. principles of aerobic exercise including progression. Written material given at graduation. ? ? ?Education: Resistance Exercise: ?- Group verbal and visual presentation on the components of exercise prescription. Introduces F.I.T.T principle from ACSM for exercise prescriptions  Reviews F.I.T.T. principles of resistance exercise including progression. Written material given at graduation. ? ?  ?Education: Exercise & Equipment Safety: ?- Individual verbal instruction and demonstration of equipment use and safety with use of the equipment. ?Flowsheet Row Cardiac Rehab from 12/05/2021 in [facility] Cardiac and Pulmonary Rehab  ?Date 12/05/21  ?Educator [place]  ?Instruction Review Code 1- Verbalizes Understanding  ? ?  ? ? ?Education: Exercise Physiology & General Exercise Guidelines: ?- Group verbal and written instruction with models to review the exercise physiology of the cardiovascular system and associated critical values. Provides general exercise guidelines with specific guidelines to those with heart or lung disease.  ? ? ?Education: Flexibility, Balance, Mind/Body Relaxation: ?- Group verbal and visual presentation with interactive activity on the components of exercise prescription. Introduces F.I.T.T principle from ACSM for exercise prescriptions. Reviews F.I.T.T. principles of flexibility and balance exercise training including progression. Also discusses the mind body connection.  Reviews various relaxation techniques to help reduce and manage stress (i.e. Deep breathing, progressive muscle relaxation, and visualization). Balance handout provided to take home. Written material given at graduation. ? ? ?Activity Barriers & Risk Stratification: ? Activity Barriers &  Cardiac Risk Stratification - 11/18/21 1011   ? ?  ? Activity Barriers & Cardiac Risk Stratification  ? Activity Barriers Joint Problems   has broken both ankles in past,  arthritis in fingers  ? Cardiac Risk Stratificat

## 2022-01-06 ENCOUNTER — Encounter: Payer: Medicare HMO | Admitting: *Deleted

## 2022-01-06 ENCOUNTER — Other Ambulatory Visit: Payer: Self-pay

## 2022-01-06 DIAGNOSIS — Z48812 Encounter for surgical aftercare following surgery on the circulatory system: Secondary | ICD-10-CM | POA: Diagnosis not present

## 2022-01-06 DIAGNOSIS — Z952 Presence of prosthetic heart valve: Secondary | ICD-10-CM

## 2022-01-06 LAB — GLUCOSE, CAPILLARY
Glucose-Capillary: 173 mg/dL — ABNORMAL HIGH (ref 70–99)
Glucose-Capillary: 72 mg/dL (ref 70–99)

## 2022-01-06 NOTE — Progress Notes (Signed)
Daily Session Note ? ?Patient Details  ?Name: Molly Hill ?MRN: [id number] ?Date of Birth: Sep 20, 1951 ?Referring Provider:   ?Flowsheet Row Cardiac Rehab from 12/05/2021 in [facility] Cardiac and Pulmonary Rehab  ?Referring Provider Dr. Fransico Him  ? ?  ? ? ?Encounter Date: 01/06/2022 ? ?Check In: ? Session Check In - 01/06/22 0945   ? ?  ? Check-In  ? Supervising physician immediately available to respond to emergencies See telemetry face sheet for immediately available ER MD   ? Location [facility]-Cardiac & Pulmonary Rehab   ? Staff Present Renita Papa, RN BSN;Joseph [place], RCP,RRT,BSRT;Jessica [place], [place], [place], [place], CCET   ? Virtual Visit No   ? Medication changes reported     No   ? Fall or balance concerns reported    No   ? Warm-up and Cool-down Performed on first and last piece of equipment   ? Resistance Training Performed Yes   ? VAD Patient? No   ? PAD/SET Patient? No   ?  ? Pain Assessment  ? Currently in Pain? No/denies   ? ?  ?  ? ?  ? ? ? ? ? ?Social History  ? ?Tobacco Use  ?Smoking Status Former  ? Years: 3.00  ? Types: Cigarettes  ? Quit date: 08/15/1979  ? Years since quitting: 42.4  ?Smokeless Tobacco Never  ?Tobacco Comments  ? 1 pack would last a week.  ? ? ?Goals Met:  ?Independence with exercise equipment ?Exercise tolerated well ?No report of concerns or symptoms today ?Strength training completed today ? ?Goals Unmet:  ?Not Applicable ? ?Comments: Pt able to follow exercise prescription today without complaint.  Will continue to monitor for progression. ? ? ? ?Dr. Emily Filbert is Medical Director for [place].  ?Dr. Ottie Glazier is Medical Director for [facility] Pulmonary Rehabilitation. ?

## 2022-01-09 ENCOUNTER — Other Ambulatory Visit: Payer: Self-pay

## 2022-01-09 ENCOUNTER — Encounter: Payer: Medicare HMO | Admitting: *Deleted

## 2022-01-09 DIAGNOSIS — Z952 Presence of prosthetic heart valve: Secondary | ICD-10-CM

## 2022-01-09 DIAGNOSIS — Z48812 Encounter for surgical aftercare following surgery on the circulatory system: Secondary | ICD-10-CM | POA: Diagnosis not present

## 2022-01-09 NOTE — Progress Notes (Signed)
Daily Session Note ? ?Patient Details  ?Name: Molly Hill ?MRN: [id number] ?Date of Birth: 11-Jul-1951 ?Referring Provider:   ?Flowsheet Row Cardiac Rehab from 12/05/2021 in [facility] Cardiac and Pulmonary Rehab  ?Referring Provider Dr. Fransico Him  ? ?  ? ? ?Encounter Date: 01/09/2022 ? ?Check In: ? Session Check In - 01/09/22 1023   ? ?  ? Check-In  ? Supervising physician immediately available to respond to emergencies See telemetry face sheet for immediately available ER MD   ? Location [facility]-Cardiac & Pulmonary Rehab   ? Staff Present Renita Papa, RN Vickki Hearing, BA, ACSM CEP, Exercise Physiologist;Kelly Amedeo Plenty, BS, ACSM CEP, Exercise Physiologist   ? Virtual Visit No   ? Medication changes reported     No   ? Fall or balance concerns reported    No   ? Warm-up and Cool-down Performed on first and last piece of equipment   ? Resistance Training Performed Yes   ? VAD Patient? No   ? PAD/SET Patient? No   ?  ? Pain Assessment  ? Currently in Pain? No/denies   ? ?  ?  ? ?  ? ? ? ? ? ?Social History  ? ?Tobacco Use  ?Smoking Status Former  ? Years: 3.00  ? Types: Cigarettes  ? Quit date: 08/15/1979  ? Years since quitting: 42.4  ?Smokeless Tobacco Never  ?Tobacco Comments  ? 1 pack would last a week.  ? ? ?Goals Met:  ?Independence with exercise equipment ?Exercise tolerated well ?No report of concerns or symptoms today ?Strength training completed today ? ?Goals Unmet:  ?Not Applicable ? ?Comments: Pt able to follow exercise prescription today without complaint.  Will continue to monitor for progression. ? ? ? ?Dr. Emily Filbert is Medical Director for [place].  ?Dr. Ottie Glazier is Medical Director for [facility] Pulmonary Rehabilitation. ?

## 2022-01-13 ENCOUNTER — Encounter: Payer: Self-pay | Admitting: *Deleted

## 2022-01-13 DIAGNOSIS — Z952 Presence of prosthetic heart valve: Secondary | ICD-10-CM

## 2022-01-13 NOTE — Progress Notes (Signed)
Discharge Progress Report ? ?Patient Details  ?Name: Molly Hill ?MRN: [id number] ?Date of Birth: 03-11-1951 ?Referring Provider:   ?Flowsheet Row Cardiac Rehab from 12/05/2021 in [facility] Cardiac and Pulmonary Rehab  ?Referring Provider Dr. Fransico Him  ? ?  ? ? ? ?Number of Visits: 4/36 ? ?Reason for Discharge:  ?Early Exit:  Personal  caring for her 72 yr old mother ? ?Diagnosis:  ?S/P TAVR (transcatheter aortic valve replacement) ? ?Initial Exercise Prescription: ? Initial Exercise Prescription - 12/05/21 1500   ? ?  ? Date of Initial Exercise RX and Referring Provider  ? Date 12/05/21   ? Referring Provider Dr. Fransico Him   ?  ? Oxygen  ? Maintain Oxygen Saturation 88% or higher   ?  ? Recumbant Bike  ? Level 2   ? RPM 60   ? Minutes 15   ? METs 2.41   ?  ? NuStep  ? Level 3   ? SPM 80   ? Minutes 15   ? METs 2.41   ?  ? Arm Ergometer  ? Level 1   ? RPM 30   ? Minutes 15   ? METs 2.41   ?  ? REL-XR  ? Level 2   ? Speed 50   ? Minutes 15   ? METs 2.41   ?  ? Biostep-RELP  ? Level 2   ? SPM 50   ? Minutes 15   ? METs 2.41   ?  ? Track  ? Laps 20   ? Minutes 15   ? METs 2.09   ?  ? Prescription Details  ? Frequency (times per week) 3   ? Duration Progress to 30 minutes of continuous aerobic without signs/symptoms of physical distress   ?  ? Intensity  ? THRR 40-80% of Max Heartrate 101-133   ? Ratings of Perceived Exertion 11-13   ? Perceived Dyspnea 0-4   ?  ? Progression  ? Progression Continue to progress workloads to maintain intensity without signs/symptoms of physical distress.   ?  ? Resistance Training  ? Training Prescription Yes   ? Weight 5   ? Reps 10-15   ? ?  ?  ? ?  ? ? ?Discharge Exercise Prescription (Final Exercise Prescription Changes): ? Exercise Prescription Changes - 12/05/21 1500   ? ?  ? Response to Exercise  ? Blood Pressure (Admit) 132/82   ? Blood Pressure (Exercise) 152/80   ? Blood Pressure (Exit) 120/82   ? Heart Rate (Admit) 69 bpm   ? Heart Rate (Exercise) 105 bpm   ? Heart Rate  (Exit) 71 bpm   ? Oxygen Saturation (Admit) 100 %   ? Oxygen Saturation (Exercise) 99 %   ? Oxygen Saturation (Exit) 100 %   ? Rating of Perceived Exertion (Exercise) 7   ? Perceived Dyspnea (Exercise) 0   ? Symptoms none   ? Comments 6 MWT results   ? ?  ?  ? ?  ? ? ?Functional Capacity: ? 6 Minute Walk   ? ? [place] Name 12/05/21 1535  ?  ?  ?  ? 6 Minute Walk  ? Phase Initial    ? Distance 1040 feet    ? Walk Time 6 minutes    ? # of Rest Breaks 0    ? MPH 1.97    ? METS 2.41    ? RPE 7    ? Perceived Dyspnea  0    ?  VO2 Peak 8.43    ? Symptoms No    ? Resting HR 69 bpm    ? Resting BP 132/82    ? Resting Oxygen Saturation  100 %    ? Exercise Oxygen Saturation  during 6 min walk 99 %    ? Max Ex. HR 105 bpm    ? Max Ex. BP 152/80    ? 2 Minute Post BP 138/82    ? ?  ?  ? ?  ? ? ? ?Nutrition: ? Nutrition Therapy & Goals - 12/05/21 1555   ? ?  ? Intervention Plan  ? Intervention Prescribe, educate and counsel regarding individualized specific dietary modifications aiming towards targeted core components such as weight, hypertension, lipid management, diabetes, heart failure and other comorbidities.   ? Expected Outcomes Short Term Goal: Understand basic principles of dietary content, such as calories, fat, sodium, cholesterol and nutrients.;Short Term Goal: A plan has been developed with personal nutrition goals set during dietitian appointment.;Long Term Goal: Adherence to prescribed nutrition plan.   ? ?  ?  ? ?  ? ? ?Goals reviewed with patient; copy given to patient. ?

## 2022-01-13 NOTE — Progress Notes (Signed)
Cardiac Individual Treatment Plan ? ?Patient Details  ?Name: Molly Hill ?MRN: [id number] ?Date of Birth: 24-Apr-1951 ?Referring Provider:   ?Flowsheet Row Cardiac Rehab from 12/05/2021 in [facility] Cardiac and Pulmonary Rehab  ?Referring Provider Dr. Fransico Him  ? ?  ? ? ?Initial Encounter Date:  ?Flowsheet Row Cardiac Rehab from 12/05/2021 in [facility] Cardiac and Pulmonary Rehab  ?Date 12/05/21  ? ?  ? ? ?Visit Diagnosis: S/P TAVR (transcatheter aortic valve replacement) ? ?Patient's Home Medications on Admission: ? ?Current Outpatient Medications:  ?  acetaminophen (TYLENOL) 500 MG tablet, Take 1,000 mg by mouth every 6 (six) hours as needed for moderate pain or headache., Disp: , Rfl:  ?  amLODipine (NORVASC) 2.5 MG tablet, Take 2.5 mg by mouth daily., Disp: , Rfl:  ?  amoxicillin (AMOXIL) 500 MG capsule, Take 4 capsules by mouth 1 hour prior to dental appointments, Disp: 4 capsule, Rfl: 3 ?  aspirin EC 81 MG tablet, Take 81 mg by mouth at bedtime., Disp: , Rfl:  ?  atorvastatin (LIPITOR) 80 MG tablet, Take 1 tablet (80 mg total) by mouth at bedtime., Disp: 90 tablet, Rfl: 3 ?  BIOTIN PO, Take by mouth daily., Disp: , Rfl:  ?  Biotin w/ Vitamins C & E (HAIR/SKIN/NAILS PO), Take 1 capsule by mouth daily., Disp: , Rfl:  ?  bisacodyl (DULCOLAX) 5 MG EC tablet, Take 2 tablets (10 mg total) by mouth daily as needed for moderate constipation., Disp: 30 tablet, Rfl: 0 ?  Calcium Carb-Cholecalciferol (CALCIUM 600 + D PO), Take 1 tablet by mouth daily., Disp: , Rfl:  ?  cholecalciferol (VITAMIN D) 25 MCG (1000 UNIT) tablet, Take 1,000 Units by mouth daily., Disp: , Rfl:  ?  clopidogrel (PLAVIX) 75 MG tablet, Take 1 tablet (75 mg total) by mouth daily., Disp: 90 tablet, Rfl: 3 ?  docusate sodium (COLACE) 100 MG capsule, Take 2 capsules (200 mg total) by mouth daily., Disp: 10 capsule, Rfl: 0 ?  fexofenadine (ALLEGRA) 180 MG tablet, Take 180 mg by mouth daily as needed for allergies., Disp: , Rfl:  ?  fluticasone (FLONASE) 50  MCG/ACT nasal spray, Place 1 spray into both nostrils as needed for allergies., Disp: , Rfl:  ?  furosemide (LASIX) 40 MG tablet, Take 1 tablet (40 mg total) by mouth daily., Disp: 90 tablet, Rfl: 3 ?  glucose blood test strip, 1 each by Other route 4 (four) times daily -  before meals and at bedtime. Use as instructed, Disp: , Rfl:  ?  insulin NPH Human (NOVOLIN N) 100 UNIT/ML injection, Inject 18 Units into the skin 2 (two) times daily before a meal., Disp: , Rfl:  ?  insulin regular (NOVOLIN R) 100 units/mL injection, Inject 4-9 Units into the skin 3 (three) times daily before meals., Disp: , Rfl:  ?  loperamide (IMODIUM A-D) 2 MG tablet, Take 1-2 mg by mouth 4 (four) times daily as needed for diarrhea or loose stools., Disp: , Rfl:  ?  losartan (COZAAR) 25 MG tablet, Take 1 tablet (25 mg total) by mouth daily., Disp: 90 tablet, Rfl: 3 ?  metoprolol succinate (TOPROL XL) 25 MG 24 hr tablet, Take 1 tablet (25 mg total) by mouth daily., Disp: 90 tablet, Rfl: 3 ?  Naphazoline-Pheniramine (EYE ALLERGY RELIEF OP), Place 1 drop into both eyes daily as needed (allergies)., Disp: , Rfl:  ?  nitroGLYCERIN (NITROSTAT) 0.4 MG SL tablet, Place 1 tablet (0.4 mg total) under the tongue every 5 (five) minutes  as needed for chest pain., Disp: 25 tablet, Rfl: 3 ?  vitamin B-12 (CYANOCOBALAMIN) 1000 MCG tablet, Take 1,000 mcg by mouth daily., Disp: , Rfl:  ?  vitamin C (ASCORBIC ACID) 500 MG tablet, Take 500 mg by mouth daily., Disp: , Rfl:  ?  Zinc 50 MG CAPS, Take 50 mg by mouth daily., Disp: , Rfl:  ? ?Past Medical History: ?Past Medical History:  ?Diagnosis Date  ? Alopecia   ? Anemia   ? Arthritis   ? Bicuspid aortic valve   ? mild to moderate AS by echo 2022  ? Chronic diastolic (congestive) heart failure (HCC)   ? Coronary arteriosclerosis in native artery   ? s/p PCI of LAD in 2013 & NSTEMI with staged DES to pLAD (10/2021)  ? DM (diabetes mellitus), type 1 ([place])   ? GERD (gastroesophageal reflux disease)   ? History of  breast cancer   ? Estrogen receptor negative. s/p chemo  ? Hyperglycemia   ? Hyperlipidemia   ? Hypertension   ? Mitral regurgitation   ? mild to moderate by echo 2020  ? Myocardial infarction ([place]) 10/09/2021  ? S/P TAVR (transcatheter aortic valve replacement) 11/08/2021  ? s/p TAVR with a 23 mm Edwards S3UR via the TF approach by Dr. Burt Knack and Dr. Cyndia Bent  ? Tendinitis of left wrist   ? ? ?Tobacco Use: ?Social History  ? ?Tobacco Use  ?Smoking Status Former  ? Years: 3.00  ? Types: Cigarettes  ? Quit date: 08/15/1979  ? Years since quitting: 42.4  ?Smokeless Tobacco Never  ?Tobacco Comments  ? 1 pack would last a week.  ? ? ?Labs: ?Review Flowsheet   ? ?  ?  Latest Ref Rng & Units 10/09/2021 10/10/2021 10/11/2021 11/04/2021  ?Labs for ITP Cardiac and Pulmonary Rehab  ?Cholestrol 0 - 200 mg/dL 107   99      ?LDL (calc) 0 - 99 mg/dL 48   36      ?HDL-C >40 mg/dL 41   39      ?Trlycerides <150 mg/dL 88   122      ?Hemoglobin A1c 4.8 - 5.6 % 7.8       ?PH, Arterial 7.350 - 7.450   7.448   7.419    ?PCO2 arterial 32.0 - 48.0 mmHg   37.7   44.9    ?Bicarbonate 20.0 - 28.0 mmol/L   25.9   28.5    ?TCO2 22 - 32 mmol/L   27     ?O2 Saturation %   93.0   98.2    ? ?  11/08/2021  ?Labs for ITP Cardiac and Pulmonary Rehab  ?Cholestrol   ?LDL (calc)   ?HDL-C   ?Trlycerides   ?Hemoglobin A1c   ?PH, Arterial   ?PCO2 arterial   ?Bicarbonate   ?TCO2 27    ? 29    ?O2 Saturation   ?  ? ? Multiple values from one day are sorted in reverse-chronological order  ?  ?  ? ? ? ?Exercise Target Goals: ?Exercise Program Goal: ?Individual exercise prescription set using results from initial 6 min walk test and THRR while considering  patient?s activity barriers and safety.  ? ?Exercise Prescription Goal: ?Initial exercise prescription builds to 30-45 minutes a day of aerobic activity, 2-3 days per week.  Home exercise guidelines will be given to patient during program as part of exercise prescription that the participant will  acknowledge. ? ? ?Education: Aerobic Exercise: ?- Group verbal  and visual presentation on the components of exercise prescription. Introduces F.I.T.T principle from ACSM for exercise prescriptions.  Reviews F.I.T.T. principles of aerobic exercise including progression. Written material given at graduation. ? ? ?Education: Resistance Exercise: ?- Group verbal and visual presentation on the components of exercise prescription. Introduces F.I.T.T principle from ACSM for exercise prescriptions  Reviews F.I.T.T. principles of resistance exercise including progression. Written material given at graduation. ? ?  ?Education: Exercise & Equipment Safety: ?- Individual verbal instruction and demonstration of equipment use and safety with use of the equipment. ?Flowsheet Row Cardiac Rehab from 12/05/2021 in [facility] Cardiac and Pulmonary Rehab  ?Date 12/05/21  ?Educator [place]  ?Instruction Review Code 1- Verbalizes Understanding  ? ?  ? ? ?Education: Exercise Physiology & General Exercise Guidelines: ?- Group verbal and written instruction with models to review the exercise physiology of the cardiovascular system and associated critical values. Provides general exercise guidelines with specific guidelines to those with heart or lung disease.  ? ? ?Education: Flexibility, Balance, Mind/Body Relaxation: ?- Group verbal and visual presentation with interactive activity on the components of exercise prescription. Introduces F.I.T.T principle from ACSM for exercise prescriptions. Reviews F.I.T.T. principles of flexibility and balance exercise training including progression. Also discusses the mind body connection.  Reviews various relaxation techniques to help reduce and manage stress (i.e. Deep breathing, progressive muscle relaxation, and visualization). Balance handout provided to take home. Written material given at graduation. ? ? ?Activity Barriers & Risk Stratification: ? Activity Barriers & Cardiac Risk Stratification - 11/18/21  1011   ? ?  ? Activity Barriers & Cardiac Risk Stratification  ? Activity Barriers Joint Problems   has broken both ankles in past,  arthritis in fingers  ? Cardiac Risk Stratification Moderate   ? ?  ?  ? ?  ? ? ?6 Minute [place]

## 2022-02-15 ENCOUNTER — Other Ambulatory Visit: Payer: Self-pay | Admitting: Internal Medicine

## 2022-02-15 ENCOUNTER — Ambulatory Visit: Payer: Medicare HMO | Admitting: Internal Medicine

## 2022-02-15 ENCOUNTER — Encounter: Payer: Self-pay | Admitting: Internal Medicine

## 2022-02-15 VITALS — BP 120/78 | HR 66 | Ht 60.0 in | Wt 151.0 lb

## 2022-02-15 DIAGNOSIS — E1059 Type 1 diabetes mellitus with other circulatory complications: Secondary | ICD-10-CM

## 2022-02-15 DIAGNOSIS — E1065 Type 1 diabetes mellitus with hyperglycemia: Secondary | ICD-10-CM | POA: Insufficient documentation

## 2022-02-15 DIAGNOSIS — E1022 Type 1 diabetes mellitus with diabetic chronic kidney disease: Secondary | ICD-10-CM | POA: Insufficient documentation

## 2022-02-15 DIAGNOSIS — E785 Hyperlipidemia, unspecified: Secondary | ICD-10-CM | POA: Diagnosis not present

## 2022-02-15 DIAGNOSIS — N1831 Chronic kidney disease, stage 3a: Secondary | ICD-10-CM

## 2022-02-15 DIAGNOSIS — R739 Hyperglycemia, unspecified: Secondary | ICD-10-CM

## 2022-02-15 LAB — POCT GLYCOSYLATED HEMOGLOBIN (HGB A1C): Hemoglobin A1C: 8 % — AB (ref 4.0–5.6)

## 2022-02-15 LAB — POCT GLUCOSE (DEVICE FOR HOME USE): Glucose Fasting, POC: 286 mg/dL — AB (ref 70–99)

## 2022-02-15 MED ORDER — DEXCOM G6 TRANSMITTER MISC: 1.0000 | 3 refills | Status: DC

## 2022-02-15 MED ORDER — NOVOLOG FLEXPEN 100 UNIT/ML ~~LOC~~ SOPN: PEN_INJECTOR | SUBCUTANEOUS | 6 refills | Status: DC

## 2022-02-15 MED ORDER — DEXCOM G6 SENSOR MISC: 1.0000 | 3 refills | Status: DC

## 2022-02-15 MED ORDER — LANTUS SOLOSTAR 100 UNIT/ML ~~LOC~~ SOPN: 32.0000 [IU] | PEN_INJECTOR | Freq: Every day | SUBCUTANEOUS | 6 refills | Status: DC

## 2022-02-15 MED ORDER — INSULIN PEN NEEDLE 32G X 4 MM MISC: 1.0000 | Freq: Four times a day (QID) | 3 refills | Status: DC

## 2022-02-15 NOTE — Progress Notes (Signed)
?Name: Molly Hill  ?MRN/ DOB: [id number], 1951-02-07   ?Age/ Sex: 71 y.o., female   ? ?PCP: Ernestene Kiel, MD   ?Reason for Endocrinology Evaluation: Type 2 Diabetes Mellitus  ?   ?Date of Initial Endocrinology Visit: 02/15/2022   ? ? ?PATIENT IDENTIFIER: [name] is a 71 y.o. female with a past medical history of T2DM, S/P TAVR, CAD, Hx of Breast ca (Dx 2016) . The patient presented for initial endocrinology clinic visit on 02/15/2022 for consultative assistance with her diabetes management.  ? ? ?HPI: ?[name] was  ? ? ?Diagnosed with DM at age 59  ?Currently checking blood sugars 4-5 x / day ?Hypoglycemia episodes : yes               Symptoms: yes                 Frequency: 2/day  ?Hemoglobin A1c 7.8%  ?Patient required assistance for hypoglycemia:  ?Patient has required hospitalization within the last 1 year from hyper or hypoglycemia:  ? ?In terms of diet, the patient eats 3 meals a day ? ? ?HOME DIABETES REGIMEN: ?Novolin-N 18-20 units BID ( around 3-4 pm) ?Novolin-R  ?0-150 - 0 ?151-250 =4 units ?251- 350= 8 units ?351- = 10 units  ? ? ? ?Statin: yes ?ACE-I/ARB: yes ? ? ? ?GLUCOSE LOG:  ?45- 444 mg/dL  ? ? ?DIABETIC COMPLICATIONS: ?Microvascular complications:  ?CKD III  ?Denies: Retinopathy, neuropathy  ?Last eye exam: Completed 01/2022 ( Dr. Katy Fitch) ? ?Macrovascular complications:  ?CAD ?Denies:  PVD, CVA ? ? ?PAST HISTORY: ?Past Medical History:  ?Past Medical History:  ?Diagnosis Date  ? Alopecia   ? Anemia   ? Arthritis   ? Bicuspid aortic valve   ? mild to moderate AS by echo 2022  ? Chronic diastolic (congestive) heart failure (HCC)   ? Coronary arteriosclerosis in native artery   ? s/p PCI of LAD in 2013 & NSTEMI with staged DES to pLAD (10/2021)  ? DM (diabetes mellitus), type 1 ([place])   ? GERD (gastroesophageal reflux disease)   ? History of breast cancer   ? Estrogen receptor negative. s/p chemo  ? Hyperglycemia   ? Hyperlipidemia   ? Hypertension   ? Mitral regurgitation   ? mild to  moderate by echo 2020  ? Myocardial infarction ([place]) 10/09/2021  ? S/P TAVR (transcatheter aortic valve replacement) 11/08/2021  ? s/p TAVR with a 23 mm Edwards S3UR via the TF approach by Dr. Burt Knack and Dr. Cyndia Bent  ? Tendinitis of left wrist   ? ?Past Surgical History:  ?Past Surgical History:  ?Procedure Laterality Date  ? ANKLE SURGERY Bilateral   ? CATARACT EXTRACTION, BILATERAL    ? CHOLECYSTECTOMY    ? CORONARY STENT INTERVENTION    ? CORONARY STENT INTERVENTION N/A 10/21/2021  ? Procedure: CORONARY STENT INTERVENTION;  Surgeon: Sherren Mocha, MD;  Location: [facility];  Service: Cardiovascular;  Laterality: N/A;  ? CORONARY/GRAFT ACUTE MI REVASCULARIZATION N/A 10/10/2021  ? Procedure: CORONARY/GRAFT ACUTE MI REVASCULARIZATION;  Surgeon: Lorretta Harp, MD;  Location: [facility];  Service: Cardiovascular;  Laterality: N/A;  ? CTR    ? ELBOW SURGERY    ? HAND SURGERY    ? BOTH THUMBS AND MIDDLE FINGERS  ? INTRAOPERATIVE TRANSTHORACIC ECHOCARDIOGRAM N/A 11/08/2021  ? Procedure: INTRAOPERATIVE TRANSTHORACIC ECHOCARDIOGRAM;  Surgeon: Sherren Mocha, MD;  Location: [facility];  Service: Open Heart Surgery;  Laterality: N/A;  ?  INTRAVASCULAR PRESSURE WIRE/FFR STUDY N/A 10/21/2021  ? Procedure: INTRAVASCULAR PRESSURE WIRE/FFR STUDY;  Surgeon: Sherren Mocha, MD;  Location: [facility];  Service: Cardiovascular;  Laterality: N/A;  ? LEFT BREAST MASTECTOMY    ? MASCULAR  REPAIR    ? PORT-A-CATH REMOVAL    ? PORTACATH PLACEMENT    ? RIGHT MODIFIED MASTECTOMY Right   ? TRANSCATHETER AORTIC VALVE REPLACEMENT, TRANSFEMORAL N/A 11/08/2021  ? Procedure: TRANSCATHETER AORTIC VALVE REPLACEMENT, TRANSFEMORAL;  Surgeon: Sherren Mocha, MD;  Location: [facility];  Service: Open Heart Surgery;  Laterality: N/A;  ? UPPER LIP SURGERY    ?  ?Social History:  reports that she quit smoking about 42 years ago. Her smoking use included cigarettes. She has never used smokeless tobacco. She reports  that she does not drink alcohol and does not use drugs. ?Family History:  ?Family History  ?Problem Relation Age of Onset  ? Heart attack Mother   ? CAD Father   ?     CABG  ? Diabetes Father   ? Stroke Father   ? Colon cancer Father   ?     26s  ? Diabetes Brother   ? Diabetes Paternal Grandfather   ? Heart attack Paternal Grandfather   ? Breast cancer Maternal Aunt   ?     87s  ? ? ? ?HOME MEDICATIONS: ?Allergies as of 02/15/2022   ? ?   Reactions  ? Codeine   ? Hyper with vomiting  ? Onion Diarrhea, Nausea And Vomiting, Other (See Comments)  ? syncope  ? Reglan [metoclopramide]   ? "pulls muscle to side" "feel paralyzed"  ? ?  ? ?  ?Medication List  ?  ? ?  ? Accurate as of Feb 15, 2022 12:16 PM. If you have any questions, ask your nurse or doctor.  ?  ?  ? ?  ? ?acetaminophen 500 MG tablet ?Commonly known as: TYLENOL ?Take 1,000 mg by mouth every 6 (six) hours as needed for moderate pain or headache. ?  ?amLODipine 2.5 MG tablet ?Commonly known as: NORVASC ?Take 2.5 mg by mouth daily. ?  ?amoxicillin 500 MG capsule ?Commonly known as: AMOXIL ?Take 4 capsules by mouth 1 hour prior to dental appointments ?  ?aspirin EC 81 MG tablet ?Take 81 mg by mouth at bedtime. ?  ?atorvastatin 80 MG tablet ?Commonly known as: LIPITOR ?Take 1 tablet (80 mg total) by mouth at bedtime. ?  ?BIOTIN PO ?Take by mouth daily. ?  ?bisacodyl 5 MG EC tablet ?Generic drug: bisacodyl ?Take 2 tablets (10 mg total) by mouth daily as needed for moderate constipation. ?  ?CALCIUM 600 + D PO ?Take 1 tablet by mouth daily. ?  ?cholecalciferol 25 MCG (1000 UNIT) tablet ?Commonly known as: VITAMIN D ?Take 1,000 Units by mouth daily. ?  ?clopidogrel 75 MG tablet ?Commonly known as: PLAVIX ?Take 1 tablet (75 mg total) by mouth daily. ?  ?docusate sodium 100 MG capsule ?Commonly known as: COLACE ?Take 2 capsules (200 mg total) by mouth daily. ?  ?doxycycline 100 MG tablet ?Commonly known as: VIBRA-TABS ?Take 100 mg by mouth daily. ?  ?EYE ALLERGY RELIEF  OP ?Place 1 drop into both eyes daily as needed (allergies). ?  ?fexofenadine 180 MG tablet ?Commonly known as: ALLEGRA ?Take 180 mg by mouth daily as needed for allergies. ?  ?fluticasone 50 MCG/ACT nasal spray ?Commonly known as: FLONASE ?Place 1 spray into both nostrils as needed for allergies. ?  ?furosemide  40 MG tablet ?Commonly known as: LASIX ?Take 1 tablet (40 mg total) by mouth daily. ?  ?glucose blood test strip ?1 each by Other route 4 (four) times daily -  before meals and at bedtime. Use as instructed ?  ?HAIR/SKIN/NAILS PO ?Take 1 capsule by mouth daily. ?  ?insulin NPH Human 100 UNIT/ML injection ?Commonly known as: NOVOLIN N ?Inject 18 Units into the skin 2 (two) times daily before a meal. ?  ?insulin regular 100 units/mL injection ?Commonly known as: NOVOLIN R ?Inject 4-9 Units into the skin 3 (three) times daily before meals. ?  ?loperamide 2 MG tablet ?Commonly known as: IMODIUM A-D ?Take 1-2 mg by mouth 4 (four) times daily as needed for diarrhea or loose stools. ?  ?losartan 25 MG tablet ?Commonly known as: COZAAR ?Take 1 tablet (25 mg total) by mouth daily. ?  ?metoprolol succinate 25 MG 24 hr tablet ?Commonly known as: Toprol XL ?Take 1 tablet (25 mg total) by mouth daily. ?  ?nitroGLYCERIN 0.4 MG SL tablet ?Commonly known as: NITROSTAT ?Place 1 tablet (0.4 mg total) under the tongue every 5 (five) minutes as needed for chest pain. ?  ?vitamin B-12 1000 MCG tablet ?Commonly known as: CYANOCOBALAMIN ?Take 1,000 mcg by mouth daily. ?  ?vitamin C 500 MG tablet ?Commonly known as: ASCORBIC ACID ?Take 500 mg by mouth daily. ?  ?Zinc 50 MG Caps ?Take 50 mg by mouth daily. ?  ? ?  ? ? ? ?ALLERGIES: ?Allergies  ?Allergen Reactions  ? Codeine   ?  Hyper with vomiting  ? Onion Diarrhea, Nausea And Vomiting and Other (See Comments)  ?  syncope  ? Reglan [Metoclopramide]   ?  "pulls muscle to side" "feel paralyzed"  ? ? ? ?REVIEW OF SYSTEMS: ?A comprehensive ROS was conducted with the patient and is  negative except as per HPI and below:  ?Review of Systems  ?Eyes:  Negative for blurred vision.  ?Gastrointestinal:  Negative for nausea and vomiting.  ?Genitourinary:  Negative for frequency.  ?Endo/Heme/

## 2022-02-15 NOTE — Patient Instructions (Signed)
-   Stop Novolin-N  ?- Stop Novolin-R  ?- Start Lantus ( long acting ) 32 units once daily  ?- Start Novolog 8 units with each meal PLUS additional if needed based on the scale below  ?-Novolog correctional insulin: ADD extra units on insulin to your meal-time Novolog dose if your blood sugars are higher than 160. Use the scale below to help guide you:  ? ?Blood sugar before meal Number of units to inject  ?Less than 160 0 unit  ?161 -  190 1 units  ?191 -  220 2 units  ?221 -  250 3 units  ?251 -  280 4 units  ?281 -  310 5 units  ?311 -  340 6 units  ?341 -  370 7 units  ?371 -  400 8 units  ?401 - 430 9 units   ?431 - 460 10 units   ? ? ? ?HOW TO TREAT LOW BLOOD SUGARS (Blood sugar LESS THAN 70 MG/DL) ?Please follow the RULE OF 15 for the treatment of hypoglycemia treatment (when your (blood sugars are less than 70 mg/dL)  ? ?STEP 1: Take 15 grams of carbohydrates when your blood sugar is low, which includes:  ?3-4 GLUCOSE TABS  OR ?3-4 OZ OF JUICE OR REGULAR SODA OR ?ONE TUBE OF GLUCOSE GEL   ? ?STEP 2: RECHECK blood sugar in 15 MINUTES ?STEP 3: If your blood sugar is still low at the 15 minute recheck --> then, go back to STEP 1 and treat AGAIN with another 15 grams of carbohydrates. ? ? ? ? ?

## 2022-02-21 ENCOUNTER — Encounter: Payer: Medicare HMO | Attending: Internal Medicine | Admitting: Nutrition

## 2022-02-21 DIAGNOSIS — E1065 Type 1 diabetes mellitus with hyperglycemia: Secondary | ICD-10-CM

## 2022-02-21 DIAGNOSIS — E1059 Type 1 diabetes mellitus with other circulatory complications: Secondary | ICD-10-CM | POA: Diagnosis present

## 2022-02-22 NOTE — Patient Instructions (Signed)
Change sensor every 10 days ?Change transmitter every 3 months ?Call pharmacy to check on cost of sensors to decide if you will continue this therapy ?

## 2022-02-22 NOTE — Progress Notes (Signed)
Patient is here for training on how to use the Dexcom. We discussed the difference between sensor readings and blood sugar readings, and she reported good understanding of this. ?The sensor was inserted into patient's left outer arm and the transmitter was inserted into the sensor.   ?The G6 mobil app was downloaded onto patient's phone and linked to [place].  ?She had no final questions. ?

## 2022-03-01 ENCOUNTER — Other Ambulatory Visit: Payer: Self-pay

## 2022-03-01 ENCOUNTER — Other Ambulatory Visit: Payer: Self-pay | Admitting: Internal Medicine

## 2022-03-01 DIAGNOSIS — E1059 Type 1 diabetes mellitus with other circulatory complications: Secondary | ICD-10-CM

## 2022-03-01 MED ORDER — DEXCOM G6 SENSOR MISC: 1.0000 | 3 refills | Status: DC

## 2022-03-03 ENCOUNTER — Other Ambulatory Visit (HOSPITAL_COMMUNITY): Payer: Self-pay

## 2022-03-03 NOTE — Telephone Encounter (Signed)
Pt insurance does not cover Dexcom. They do cover ACCU-CHEK or TRUE METRIX CGM. They must also use one of their preferred vendors [employer]: [phone] OR [employer]: [phone]

## 2022-03-06 ENCOUNTER — Other Ambulatory Visit (HOSPITAL_COMMUNITY): Payer: Self-pay

## 2022-03-06 ENCOUNTER — Telehealth: Payer: Self-pay

## 2022-03-06 NOTE — Telephone Encounter (Signed)
Molly Hill,  If the levels are before dinner, she needs to reduce the NovoLog before lunch (by approximately 2 units and see how this goes).  If the lows are after dinner, she needs to reduce NovoLog before dinner.  However, please ask her if she has lows at other times of the day, and in that case, we may need to back off her Lantus from 32 to 28 units daily.

## 2022-03-06 NOTE — Telephone Encounter (Signed)
Pt will have to pay out of pocket as this in not a PA Required. Pt's part D insurance does not cover the CGM. Pt may try under Part B, but with [employer] is will still have to go through their preferred vendors listed above.

## 2022-03-06 NOTE — Telephone Encounter (Signed)
Patient notified and would make medication adjustments

## 2022-03-06 NOTE — Telephone Encounter (Signed)
Patient states that she is experiencing low's in the even around 5-6 pm. Ranging from 55-65. Patient takes 8 units of  of Novolog with each meal and Lantus  32 units daily.

## 2022-03-06 NOTE — Telephone Encounter (Signed)
Patient doesn't want to use the meter that insurance covers.

## 2022-05-15 NOTE — Progress Notes (Deleted)
HPI: Follow-up coronary artery disease and previous TAVR.  Previously followed by Dr. Radford Pax but transitioning to me.  Carotid Dopplers October 2022 showed 1 to 39% bilateral stenosis.  Patient has a history of PCI of her LAD.  Last catheterization January 2023 showed 90% RPAV 50% RCA, 75% ostial to proximal LAD, patent stent in the LAD; FFR of RCA negative but positive in the LAD.  Patient had PCI of the LAD at that time.  Patient also with history of severe aortic stenosis secondary to bicuspid aortic valve.  CTA December 2022 showed lesions in the left kidney concerning for neoplasm and MRI recommended; 3 mm right upper lobe nodule and follow-up in 12 months recommended.  MRI December 2022 showed no mass in the left kidney, cysts and follow-up CT with and without contrast recommended in 3 to 6 months.  Renal ultrasound March 2023 showed simple right renal cyst and no follow-up recommended.  Patient underwent TAVR with a 23 mm Edwards SAPIEN 3 ultra Resilia THV November 08, 2021.  Last echocardiogram February 2023 showed normal LV function, moderate asymmetric septal hypertrophy, grade 2 diastolic dysfunction, mild mitral regurgitation, moderate mitral stenosis with mean gradient 6 mmHg, mitral valve area 1.1 cm, status post AVR with mean gradient 7 mmHg.  Current Outpatient Medications  Medication Sig Dispense Refill   acetaminophen (TYLENOL) 500 MG tablet Take 1,000 mg by mouth every 6 (six) hours as needed for moderate pain or headache.     amLODipine (NORVASC) 2.5 MG tablet Take 2.5 mg by mouth daily.     amoxicillin (AMOXIL) 500 MG capsule Take 4 capsules by mouth 1 hour prior to dental appointments (Patient not taking: Reported on 02/15/2022) 4 capsule 3   aspirin EC 81 MG tablet Take 81 mg by mouth at bedtime.     atorvastatin (LIPITOR) 80 MG tablet Take 1 tablet (80 mg total) by mouth at bedtime. 90 tablet 3   BIOTIN PO Take by mouth daily.     Biotin w/ Vitamins C & E (HAIR/SKIN/NAILS  PO) Take 1 capsule by mouth daily.     bisacodyl (DULCOLAX) 5 MG EC tablet Take 2 tablets (10 mg total) by mouth daily as needed for moderate constipation. 30 tablet 0   Calcium Carb-Cholecalciferol (CALCIUM 600 + D PO) Take 1 tablet by mouth daily.     cholecalciferol (VITAMIN D) 25 MCG (1000 UNIT) tablet Take 1,000 Units by mouth daily.     clopidogrel (PLAVIX) 75 MG tablet Take 1 tablet (75 mg total) by mouth daily. 90 tablet 3   Continuous Blood Gluc Sensor (DEXCOM G6 SENSOR) MISC 1 Device by Does not apply route as directed. 9 each 3   Continuous Blood Gluc Transmit (DEXCOM G6 TRANSMITTER) MISC 1 Device by Does not apply route as directed. 1 each 3   docusate sodium (COLACE) 100 MG capsule Take 2 capsules (200 mg total) by mouth daily. 10 capsule 0   doxycycline (VIBRA-TABS) 100 MG tablet Take 100 mg by mouth daily.     fexofenadine (ALLEGRA) 180 MG tablet Take 180 mg by mouth daily as needed for allergies. (Patient not taking: Reported on 02/15/2022)     fluticasone (FLONASE) 50 MCG/ACT nasal spray Place 1 spray into both nostrils as needed for allergies. (Patient not taking: Reported on 02/15/2022)     furosemide (LASIX) 40 MG tablet Take 1 tablet (40 mg total) by mouth daily. (Patient not taking: Reported on 02/15/2022) 90 tablet 3   glucose blood  test strip 1 each by Other route 4 (four) times daily -  before meals and at bedtime. Use as instructed     insulin aspart (NOVOLOG FLEXPEN) 100 UNIT/ML FlexPen Max daily 45 units per scale 45 mL 6   insulin glargine-yfgn (SEMGLEE) 100 UNIT/ML Pen Inject 32 Units into the skin daily. 30 mL 6   Insulin Pen Needle 32G X 4 MM MISC 1 Device by Does not apply route in the morning, at noon, in the evening, and at bedtime. 400 each 3   loperamide (IMODIUM A-D) 2 MG tablet Take 1-2 mg by mouth 4 (four) times daily as needed for diarrhea or loose stools.     losartan (COZAAR) 25 MG tablet Take 1 tablet (25 mg total) by mouth daily. 90 tablet 3   metoprolol  succinate (TOPROL XL) 25 MG 24 hr tablet Take 1 tablet (25 mg total) by mouth daily. 90 tablet 3   Naphazoline-Pheniramine (EYE ALLERGY RELIEF OP) Place 1 drop into both eyes daily as needed (allergies).     nitroGLYCERIN (NITROSTAT) 0.4 MG SL tablet Place 1 tablet (0.4 mg total) under the tongue every 5 (five) minutes as needed for chest pain. 25 tablet 3   vitamin B-12 (CYANOCOBALAMIN) 1000 MCG tablet Take 1,000 mcg by mouth daily.     vitamin C (ASCORBIC ACID) 500 MG tablet Take 500 mg by mouth daily.     Zinc 50 MG CAPS Take 50 mg by mouth daily.     No current facility-administered medications for this visit.     Past Medical History:  Diagnosis Date   Alopecia    Anemia    Arthritis    Bicuspid aortic valve    mild to moderate AS by echo 2022   Chronic diastolic (congestive) heart failure (HCC)    Coronary arteriosclerosis in native artery    s/p PCI of LAD in 2013 & NSTEMI with staged DES to pLAD (10/2021)   DM (diabetes mellitus), type 1 (HCC)    GERD (gastroesophageal reflux disease)    History of breast cancer    Estrogen receptor negative. s/p chemo   Hyperglycemia    Hyperlipidemia    Hypertension    Mitral regurgitation    mild to moderate by echo 2020   Myocardial infarction [facility]) 10/09/2021   S/P TAVR (transcatheter aortic valve replacement) 11/08/2021   s/p TAVR with a 23 mm Edwards S3UR via the TF approach by Dr. Burt Knack and Dr. Cyndia Bent   Tendinitis of left wrist     Past Surgical History:  Procedure Laterality Date   ANKLE SURGERY Bilateral    CATARACT EXTRACTION, BILATERAL     CHOLECYSTECTOMY     CORONARY STENT INTERVENTION     CORONARY STENT INTERVENTION N/A 10/21/2021   Procedure: CORONARY STENT INTERVENTION;  Surgeon: Sherren Mocha, MD;  Location: [facility];  Service: Cardiovascular;  Laterality: N/A;   CORONARY/GRAFT ACUTE MI REVASCULARIZATION N/A 10/10/2021   Procedure: CORONARY/GRAFT ACUTE MI REVASCULARIZATION;  Surgeon: Lorretta Harp,  MD;  Location: [facility];  Service: Cardiovascular;  Laterality: N/A;   CTR     ELBOW SURGERY     HAND SURGERY     BOTH THUMBS AND MIDDLE FINGERS   INTRAOPERATIVE TRANSTHORACIC ECHOCARDIOGRAM N/A 11/08/2021   Procedure: INTRAOPERATIVE TRANSTHORACIC ECHOCARDIOGRAM;  Surgeon: Sherren Mocha, MD;  Location: [place];  Service: Open Heart Surgery;  Laterality: N/A;   INTRAVASCULAR PRESSURE WIRE/FFR STUDY N/A 10/21/2021   Procedure: INTRAVASCULAR PRESSURE WIRE/FFR STUDY;  Surgeon: Sherren Mocha, MD;  Location: [place] [facility];  Service: Cardiovascular;  Laterality: N/A;   LEFT BREAST MASTECTOMY     MASCULAR  REPAIR     PORT-A-CATH REMOVAL     PORTACATH PLACEMENT     RIGHT MODIFIED MASTECTOMY Right    TRANSCATHETER AORTIC VALVE REPLACEMENT, TRANSFEMORAL N/A 11/08/2021   Procedure: TRANSCATHETER AORTIC VALVE REPLACEMENT, TRANSFEMORAL;  Surgeon: Sherren Mocha, MD;  Location: [place] [facility];  Service: Open Heart Surgery;  Laterality: N/A;   UPPER LIP SURGERY      Social History   Socioeconomic History   Marital status: Widowed    Spouse name: Not on file   Number of children: Not on file   Years of education: Not on file   Highest education level: Not on file  Occupational History   Not on file  Tobacco Use   Smoking status: Former    Years: 3.00    Types: Cigarettes    Quit date: 08/15/1979    Years since quitting: 42.7   Smokeless tobacco: Never   Tobacco comments:    1 pack would last a week.  Vaping Use   Vaping Use: Never used  Substance and Sexual Activity   Alcohol use: Never   Drug use: Never   Sexual activity: Never  Other Topics Concern   Not on file  Social History Narrative   Not on file   Social Determinants of Health   Financial Resource Strain: Not on file  Food Insecurity: Not on file  Transportation Needs: Not on file  Physical Activity: Not on file  Stress: Not on file  Social Connections: Not on file  Intimate Partner  Violence: Not on file    Family History  Problem Relation Age of Onset   Heart attack Mother    CAD Father        CABG   Diabetes Father    Stroke Father    Colon cancer Father        78s   Diabetes Brother    Diabetes Paternal Grandfather    Heart attack Paternal Grandfather    Breast cancer Maternal Aunt        63s    ROS: no fevers or chills, productive cough, hemoptysis, dysphasia, odynophagia, melena, hematochezia, dysuria, hematuria, [place], seizure activity, orthopnea, PND, pedal edema, claudication. Remaining systems are negative.  Physical Exam: Well-developed well-nourished in no acute distress.  Skin is warm and dry.  HEENT is normal.  Neck is supple.  Chest is clear to auscultation with normal expansion.  Cardiovascular exam is regular rate and rhythm.  Abdominal exam nontender or distended. No masses palpated. Extremities show no edema. neuro grossly intact  ECG- personally reviewed  A/P  1 severe aortic stenosis status post TAVR-continue SBE prophylaxis.  Most recent echocardiogram showed normally functioning valve.  2 moderate mitral stenosis-plan follow-up echocardiogram February 2024.  3 coronary artery disease-continue aspirin, Plavix and statin.  4 hypertension-patient's blood pressure is controlled.  Continue present medications and follow.  5 hyperlipidemia-continue statin.  6 lung nodule-plan follow-up noncontrast chest CT December 2023.  7 chronic diastolic congestive heart failure-she is euvolemic on examination today.  Continue diuretic at present dose.  8 renal lesions-we will arrange follow-up CT.  Kirk Ruths, MD

## 2022-05-23 ENCOUNTER — Other Ambulatory Visit: Payer: Self-pay | Admitting: Physician Assistant

## 2022-05-29 ENCOUNTER — Ambulatory Visit: Payer: Medicare HMO | Admitting: Cardiology

## 2022-06-12 NOTE — Progress Notes (Signed)
HPI: Follow-up coronary artery disease and previous TAVR.  Previously followed by Dr. Radford Pax but transitioning to me.  Carotid Dopplers October 2022 showed 1 to 39% bilateral stenosis.  Patient has a history of PCI of her LAD.  Last catheterization January 2023 showed 90% RPAV 50% RCA, 75% ostial to proximal LAD, patent stent in the LAD; FFR of RCA negative but positive in the LAD.  Patient had PCI of the LAD at that time.  Patient also with history of severe aortic stenosis secondary to bicuspid aortic valve.  CTA December 2022 showed lesions in the left kidney concerning for neoplasm and MRI recommended; 3 mm right upper lobe nodule and follow-up in 12 months recommended.  MRI December 2022 showed no mass in the left kidney, cysts and follow-up CT with and without contrast recommended in 3 to 6 months.  Renal ultrasound March 2023 showed simple right renal cyst and no follow-up recommended.  Patient underwent TAVR with a 23 mm Edwards SAPIEN 3 ultra Resilia THV November 08, 2021.  Last echocardiogram February 2023 showed normal LV function, moderate asymmetric septal hypertrophy, grade 2 diastolic dysfunction, mild mitral regurgitation, moderate mitral stenosis with mean gradient 6 mmHg, mitral valve area 1.1 cm, status post AVR with mean gradient 7 mmHg. Since last seen she denies dyspnea, chest pain, palpitations or syncope.  Current Outpatient Medications  Medication Sig Dispense Refill   acetaminophen (TYLENOL) 500 MG tablet Take 1,000 mg by mouth every 6 (six) hours as needed for moderate pain or headache.     amLODipine (NORVASC) 2.5 MG tablet Take 2.5 mg by mouth daily.     amoxicillin (AMOXIL) 500 MG capsule Take 4 capsules by mouth 1 hour prior to dental appointments 4 capsule 3   aspirin EC 81 MG tablet Take 81 mg by mouth at bedtime.     atorvastatin (LIPITOR) 80 MG tablet Take 1 tablet (80 mg total) by mouth at bedtime. 90 tablet 3   BIOTIN PO Take by mouth daily.     Biotin w/  Vitamins C & E (HAIR/SKIN/NAILS PO) Take 1 capsule by mouth daily.     bisacodyl (DULCOLAX) 5 MG EC tablet Take 2 tablets (10 mg total) by mouth daily as needed for moderate constipation. 30 tablet 0   Calcium Carb-Cholecalciferol (CALCIUM 600 + D PO) Take 1 tablet by mouth daily.     cholecalciferol (VITAMIN D) 25 MCG (1000 UNIT) tablet Take 1,000 Units by mouth daily.     clopidogrel (PLAVIX) 75 MG tablet Take 1 tablet (75 mg total) by mouth daily. 90 tablet 3   Continuous Blood Gluc Receiver ([place]) DEVI by Does not apply route.     Continuous Blood Gluc Sensor (DEXCOM G7 SENSOR) MISC 1 Device by Does not apply route every 30 (thirty) days.     docusate sodium (COLACE) 100 MG capsule Take 2 capsules (200 mg total) by mouth daily. 10 capsule 0   doxycycline (VIBRA-TABS) 100 MG tablet Take 100 mg by mouth daily.     fexofenadine (ALLEGRA) 180 MG tablet Take 180 mg by mouth daily as needed for allergies.     fluticasone (FLONASE) 50 MCG/ACT nasal spray Place 1 spray into both nostrils as needed for allergies.     furosemide (LASIX) 40 MG tablet TAKE 1 TABLET BY MOUTH EVERY DAY 90 tablet 2   glucose blood test strip 1 each by Other route 4 (four) times daily -  before meals and at bedtime. Use  as instructed     insulin aspart (NOVOLOG FLEXPEN) 100 UNIT/ML FlexPen Max daily 45 units per scale 45 mL 6   insulin glargine-yfgn (SEMGLEE) 100 UNIT/ML Pen Inject 32 Units into the skin daily. 30 mL 6   Insulin Pen Needle 32G X 4 MM MISC 1 Device by Does not apply route in the morning, at noon, in the evening, and at bedtime. 400 each 3   loperamide (IMODIUM A-D) 2 MG tablet Take 1-2 mg by mouth 4 (four) times daily as needed for diarrhea or loose stools.     metoprolol succinate (TOPROL XL) 25 MG 24 hr tablet Take 1 tablet (25 mg total) by mouth daily. 90 tablet 3   Naphazoline-Pheniramine (EYE ALLERGY RELIEF OP) Place 1 drop into both eyes daily as needed (allergies).     nitroGLYCERIN  (NITROSTAT) 0.4 MG SL tablet Place 1 tablet (0.4 mg total) under the tongue every 5 (five) minutes as needed for chest pain. 25 tablet 3   vitamin B-12 (CYANOCOBALAMIN) 1000 MCG tablet Take 1,000 mcg by mouth daily.     vitamin C (ASCORBIC ACID) 500 MG tablet Take 500 mg by mouth daily.     Zinc 50 MG CAPS Take 50 mg by mouth daily.     Continuous Blood Gluc Sensor (DEXCOM G6 SENSOR) MISC 1 Device by Does not apply route as directed. (Patient not taking: Reported on 06/23/2022) 9 each 3   Continuous Blood Gluc Transmit (DEXCOM G6 TRANSMITTER) MISC 1 Device by Does not apply route as directed. (Patient not taking: Reported on 06/23/2022) 1 each 3   losartan (COZAAR) 25 MG tablet Take 1 tablet (25 mg total) by mouth daily. 90 tablet 3   No current facility-administered medications for this visit.     Past Medical History:  Diagnosis Date   Alopecia    Anemia    Arthritis    Bicuspid aortic valve    mild to moderate AS by echo 2022   Chronic diastolic (congestive) heart failure (HCC)    Coronary arteriosclerosis in native artery    s/p PCI of LAD in 2013 & NSTEMI with staged DES to pLAD (10/2021)   DM (diabetes mellitus), type 1 (HCC)    GERD (gastroesophageal reflux disease)    History of breast cancer    Estrogen receptor negative. s/p chemo   Hyperglycemia    Hyperlipidemia    Hypertension    Mitral regurgitation    mild to moderate by echo 2020   Myocardial infarction [facility]) 10/09/2021   S/P TAVR (transcatheter aortic valve replacement) 11/08/2021   s/p TAVR with a 23 mm Edwards S3UR via the TF approach by Dr. Burt Knack and Dr. Cyndia Bent   Tendinitis of left wrist     Past Surgical History:  Procedure Laterality Date   ANKLE SURGERY Bilateral    CATARACT EXTRACTION, BILATERAL     CHOLECYSTECTOMY     CORONARY STENT INTERVENTION     CORONARY STENT INTERVENTION N/A 10/21/2021   Procedure: CORONARY STENT INTERVENTION;  Surgeon: Sherren Mocha, MD;  Location: [place] [facility];  Service:  Cardiovascular;  Laterality: N/A;   CORONARY/GRAFT ACUTE MI REVASCULARIZATION N/A 10/10/2021   Procedure: CORONARY/GRAFT ACUTE MI REVASCULARIZATION;  Surgeon: Lorretta Harp, MD;  Location: [place] [facility];  Service: Cardiovascular;  Laterality: N/A;   CTR     ELBOW SURGERY     HAND SURGERY     BOTH THUMBS AND MIDDLE FINGERS   INTRAOPERATIVE TRANSTHORACIC ECHOCARDIOGRAM N/A 11/08/2021   Procedure:  INTRAOPERATIVE TRANSTHORACIC ECHOCARDIOGRAM;  Surgeon: Sherren Mocha, MD;  Location: [place] [facility];  Service: Open Heart Surgery;  Laterality: N/A;   INTRAVASCULAR PRESSURE WIRE/FFR STUDY N/A 10/21/2021   Procedure: INTRAVASCULAR PRESSURE WIRE/FFR STUDY;  Surgeon: Sherren Mocha, MD;  Location: [place] [facility];  Service: Cardiovascular;  Laterality: N/A;   LEFT BREAST MASTECTOMY     MASCULAR  REPAIR     PORT-A-CATH REMOVAL     PORTACATH PLACEMENT     RIGHT MODIFIED MASTECTOMY Right    TRANSCATHETER AORTIC VALVE REPLACEMENT, TRANSFEMORAL N/A 11/08/2021   Procedure: TRANSCATHETER AORTIC VALVE REPLACEMENT, TRANSFEMORAL;  Surgeon: Sherren Mocha, MD;  Location: [place] [facility];  Service: Open Heart Surgery;  Laterality: N/A;   UPPER LIP SURGERY      Social History   Socioeconomic History   Marital status: Widowed    Spouse name: Not on file   Number of children: Not on file   Years of education: Not on file   Highest education level: Not on file  Occupational History   Not on file  Tobacco Use   Smoking status: Former    Years: 3.00    Types: Cigarettes    Quit date: 08/15/1979    Years since quitting: 42.8   Smokeless tobacco: Never   Tobacco comments:    1 pack would last a week.  Vaping Use   Vaping Use: Never used  Substance and Sexual Activity   Alcohol use: Never   Drug use: Never   Sexual activity: Never  Other Topics Concern   Not on file  Social History Narrative   Not on file   Social Determinants of Health   Financial Resource Strain: Not on file   Food Insecurity: Not on file  Transportation Needs: Not on file  Physical Activity: Not on file  Stress: Not on file  Social Connections: Not on file  Intimate Partner Violence: Not on file    Family History  Problem Relation Age of Onset   Heart attack Mother    CAD Father        CABG   Diabetes Father    Stroke Father    Colon cancer Father        34s   Diabetes Brother    Diabetes Paternal Grandfather    Heart attack Paternal Grandfather    Breast cancer Maternal Aunt        58s    ROS: no fevers or chills, productive cough, hemoptysis, dysphasia, odynophagia, melena, hematochezia, dysuria, hematuria, [place], seizure activity, orthopnea, PND, pedal edema, claudication. Remaining systems are negative.  Physical Exam: Well-developed well-nourished in no acute distress.  Skin is warm and dry.  HEENT is normal.  Neck is supple.  Chest is clear to auscultation with normal expansion.  Cardiovascular exam is regular rate and rhythm.  2/6 systolic murmur left sternal border. Abdominal exam nontender or distended. No masses palpated. Extremities show no edema. neuro grossly intact  ECG-normal sinus rhythm at a rate of 66, cannot rule out septal infarct.  Personally reviewed  A/P  1 severe aortic stenosis status post TAVR-continue SBE prophylaxis.  Most recent echocardiogram showed normally functioning valve.  2 moderate mitral stenosis-plan follow-up echocardiogram February 2024.  3 coronary artery disease-continue aspirin, Plavix and statin.  4 hypertension-patient's blood pressure is controlled.  Continue present medications and follow.  5 hyperlipidemia-continue statin.  6 lung nodule-plan follow-up noncontrast chest CT December 2023.  7 chronic diastolic congestive heart failure-she is euvolemic on examination today.  Continue diuretic at present dose.  8 renal lesions-we will arrange follow-up CT. She has mild renal insufficiency.  Hold Lasix 2 days prior to CT  of kidney.  Kirk Ruths, MD

## 2022-06-23 ENCOUNTER — Encounter: Payer: Self-pay | Admitting: Cardiology

## 2022-06-23 ENCOUNTER — Ambulatory Visit: Payer: Medicare HMO | Attending: Cardiology | Admitting: Cardiology

## 2022-06-23 VITALS — BP 148/68 | HR 66 | Ht 60.0 in | Wt 151.8 lb

## 2022-06-23 DIAGNOSIS — I5032 Chronic diastolic (congestive) heart failure: Secondary | ICD-10-CM | POA: Diagnosis not present

## 2022-06-23 DIAGNOSIS — E7800 Pure hypercholesterolemia, unspecified: Secondary | ICD-10-CM

## 2022-06-23 DIAGNOSIS — Z952 Presence of prosthetic heart valve: Secondary | ICD-10-CM | POA: Diagnosis not present

## 2022-06-23 DIAGNOSIS — I2510 Atherosclerotic heart disease of native coronary artery without angina pectoris: Secondary | ICD-10-CM | POA: Diagnosis not present

## 2022-06-23 DIAGNOSIS — R911 Solitary pulmonary nodule: Secondary | ICD-10-CM

## 2022-06-23 DIAGNOSIS — N2889 Other specified disorders of kidney and ureter: Secondary | ICD-10-CM

## 2022-06-23 DIAGNOSIS — Z9861 Coronary angioplasty status: Secondary | ICD-10-CM

## 2022-06-23 DIAGNOSIS — I10 Essential (primary) hypertension: Secondary | ICD-10-CM

## 2022-06-23 NOTE — Patient Instructions (Signed)
  Testing/Procedures:  Ct of the abdomen w/wo at [address]-DO NOT TAKE FUROSEMIDE 2 DAYS PRIOR TO THE SCAN   Follow-Up: At [facility], you and your health needs are our priority.  As part of our continuing mission to provide you with exceptional heart care, we have created designated Provider Care Teams.  These Care Teams include your primary Cardiologist (physician) and Advanced Practice Providers (APPs -  Physician Assistants and Nurse Practitioners) who all work together to provide you with the care you need, when you need it.  We recommend signing up for the patient portal called "MyChart".  Sign up information is provided on this After Visit Summary.  MyChart is used to connect with patients for Virtual Visits (Telemedicine).  Patients are able to view lab/test results, encounter notes, upcoming appointments, etc.  Non-urgent messages can be sent to your provider as well.   To learn more about what you can do with MyChart, go to [email or web address].    Your next appointment:   6 month(s)  The format for your next appointment:   In Person  Provider:   Kirk Ruths, MD

## 2022-06-24 LAB — COMPREHENSIVE METABOLIC PANEL
ALT: 16 IU/L (ref 0–32)
AST: 24 IU/L (ref 0–40)
Albumin/Globulin Ratio: 1.7 (ref 1.2–2.2)
Albumin: 4.7 g/dL (ref 3.8–4.8)
Alkaline Phosphatase: 106 IU/L (ref 44–121)
BUN/Creatinine Ratio: 16 (ref 12–28)
BUN: 24 mg/dL (ref 8–27)
Bilirubin Total: 0.4 mg/dL (ref 0.0–1.2)
CO2: 29 mmol/L (ref 20–29)
Calcium: 10.6 mg/dL — ABNORMAL HIGH (ref 8.7–10.3)
Chloride: 100 mmol/L (ref 96–106)
Creatinine, Ser: 1.54 mg/dL — ABNORMAL HIGH (ref 0.57–1.00)
Globulin, Total: 2.7 g/dL (ref 1.5–4.5)
Glucose: 131 mg/dL — ABNORMAL HIGH (ref 70–99)
Potassium: 4.4 mmol/L (ref 3.5–5.2)
Sodium: 143 mmol/L (ref 134–144)
Total Protein: 7.4 g/dL (ref 6.0–8.5)
eGFR: 36 mL/min/{1.73_m2} — ABNORMAL LOW (ref >59)

## 2022-06-24 LAB — LIPID PANEL
Chol/HDL Ratio: 2.3 ratio (ref 0.0–4.4)
Cholesterol, Total: 106 mg/dL (ref 100–199)
HDL: 46 mg/dL (ref >39)
LDL Chol Calc (NIH): 39 mg/dL (ref 0–99)
Triglycerides: 116 mg/dL (ref 0–149)
VLDL Cholesterol Cal: 21 mg/dL (ref 5–40)

## 2022-06-25 NOTE — Progress Notes (Unsigned)
Name: Molly Hill  MRN/ DOB: [id number], 1951/06/16   Age/ Sex: 71 y.o., female    PCP: Ernestene Kiel, MD   Reason for Endocrinology Evaluation: Type 2 Diabetes Mellitus     Date of Initial Endocrinology Visit: 02/15/2022    PATIENT IDENTIFIER: [name] is a 71 y.o. female with a past medical history of T2DM, S/P TAVR, CAD, Hx of Breast ca (Dx 2016) . The patient presented for initial endocrinology clinic visit on 02/15/2022 for consultative assistance with her diabetes management.    HPI: [name] was    Diagnosed with DM at age 33  Hemoglobin A1c 7.8%     On her initial visit to uor clinic, her A1c was 8.0 %, she was on regular insulin and NPH. We switched her to insulin analogues and adjusted the doses , dexcom was faxed to DME  SUBJECTIVE:   During the last visit (02/15/2022): A1c 8.0%  Today (06/26/22): [name] is here for a follow up on diabetes management. She  checks her blood sugars multiple times daily. The patient has  had hypoglycemic episodes since the last clinic visit. The patient is  symptomatic with these episodes.   She continues to follow up with cardiology for AS, MS and CAD  She has insomnia, on melatonin and benadryl  Denies nausea, vomiting or diarrhea    HOME DIABETES REGIMEN: Semglee  32 units daily  Novolog 8 units TIDQAC CF: NOvolog (BG -130/30)     Statin: yes ACE-I/ARB: yes    CONTINUOUS GLUCOSE MONITORING RECORD INTERPRETATION    Dates of Recording: 8/29-9/08/2022  Sensor description:dexcom G7  Results statistics:   CGM use % of time   Average and SD   Time in range        %  % Time Above 180   % Time above 250   % Time Below target    Glycemic patterns summary: BG's are high during the night and trend down during the day  Hyperglycemic episodes  post prandial   Hypoglycemic episodes occurred post bolus   Overnight periods: high     DIABETIC COMPLICATIONS: Microvascular complications:  CKD III   Denies: Retinopathy, neuropathy  Last eye exam: Completed 01/2022 ( Dr. Katy Fitch)  Macrovascular complications:  CAD Denies:  PVD, CVA   PAST HISTORY: Past Medical History:  Past Medical History:  Diagnosis Date   Alopecia    Anemia    Arthritis    Bicuspid aortic valve    mild to moderate AS by echo 2022   Chronic diastolic (congestive) heart failure ([place])    Coronary arteriosclerosis in native artery    s/p PCI of LAD in 2013 & NSTEMI with staged DES to pLAD (10/2021)   DM (diabetes mellitus), type 1 (HCC)    GERD (gastroesophageal reflux disease)    History of breast cancer    Estrogen receptor negative. s/p chemo   Hyperglycemia    Hyperlipidemia    Hypertension    Mitral regurgitation    mild to moderate by echo 2020   Myocardial infarction [facility]) 10/09/2021   S/P TAVR (transcatheter aortic valve replacement) 11/08/2021   s/p TAVR with a 23 mm Edwards S3UR via the TF approach by Dr. Burt Knack and Dr. Cyndia Bent   Tendinitis of left wrist    Past Surgical History:  Past Surgical History:  Procedure Laterality Date   ANKLE SURGERY Bilateral    CATARACT EXTRACTION, BILATERAL     CHOLECYSTECTOMY     CORONARY STENT  INTERVENTION     CORONARY STENT INTERVENTION N/A 10/21/2021   Procedure: CORONARY STENT INTERVENTION;  Surgeon: Sherren Mocha, MD;  Location: [place] [facility];  Service: Cardiovascular;  Laterality: N/A;   CORONARY/GRAFT ACUTE MI REVASCULARIZATION N/A 10/10/2021   Procedure: CORONARY/GRAFT ACUTE MI REVASCULARIZATION;  Surgeon: Lorretta Harp, MD;  Location: [place] [facility];  Service: Cardiovascular;  Laterality: N/A;   CTR     ELBOW SURGERY     HAND SURGERY     BOTH THUMBS AND MIDDLE FINGERS   INTRAOPERATIVE TRANSTHORACIC ECHOCARDIOGRAM N/A 11/08/2021   Procedure: INTRAOPERATIVE TRANSTHORACIC ECHOCARDIOGRAM;  Surgeon: Sherren Mocha, MD;  Location: [place] [facility];  Service: Open Heart Surgery;  Laterality: N/A;   INTRAVASCULAR PRESSURE WIRE/FFR STUDY N/A  10/21/2021   Procedure: INTRAVASCULAR PRESSURE WIRE/FFR STUDY;  Surgeon: Sherren Mocha, MD;  Location: [place] [facility];  Service: Cardiovascular;  Laterality: N/A;   LEFT BREAST MASTECTOMY     MASCULAR  REPAIR     PORT-A-CATH REMOVAL     PORTACATH PLACEMENT     RIGHT MODIFIED MASTECTOMY Right    TRANSCATHETER AORTIC VALVE REPLACEMENT, TRANSFEMORAL N/A 11/08/2021   Procedure: TRANSCATHETER AORTIC VALVE REPLACEMENT, TRANSFEMORAL;  Surgeon: Sherren Mocha, MD;  Location: [place] [facility];  Service: Open Heart Surgery;  Laterality: N/A;   UPPER LIP SURGERY      Social History:  reports that she quit smoking about 42 years ago. Her smoking use included cigarettes. She has never used smokeless tobacco. She reports that she does not drink alcohol and does not use drugs. Family History:  Family History  Problem Relation Age of Onset   Heart attack Mother    CAD Father        CABG   Diabetes Father    Stroke Father    Colon cancer Father        28s   Diabetes Brother    Diabetes Paternal Grandfather    Heart attack Paternal Grandfather    Breast cancer Maternal Aunt        51s     HOME MEDICATIONS: Allergies as of 06/26/2022       Reactions   Codeine    Hyper with vomiting   Onion Diarrhea, Nausea And Vomiting, Other (See Comments)   syncope   Reglan [metoclopramide]    "pulls muscle to side" "feel paralyzed"        Medication List        Accurate as of June 26, 2022 11:11 AM. If you have any questions, ask your nurse or doctor.          STOP taking these medications    Dexcom G6 Transmitter Misc Stopped by: Dorita Sciara, MD   doxycycline 100 MG tablet Commonly known as: VIBRA-TABS Stopped by: Dorita Sciara, MD       TAKE these medications    acetaminophen 500 MG tablet Commonly known as: TYLENOL Take 1,000 mg by mouth every 6 (six) hours as needed for moderate pain or headache.   amLODipine 2.5 MG tablet Commonly known as:  NORVASC Take 2.5 mg by mouth daily.   amoxicillin 500 MG capsule Commonly known as: AMOXIL Take 4 capsules by mouth 1 hour prior to dental appointments   ascorbic acid 500 MG tablet Commonly known as: VITAMIN C Take 500 mg by mouth daily.   aspirin EC 81 MG tablet Take 81 mg by mouth at bedtime.   atorvastatin 80 MG tablet Commonly known as: LIPITOR Take 1  tablet (80 mg total) by mouth at bedtime.   BIOTIN PO Take by mouth daily.   bisacodyl 5 MG EC tablet Generic drug: bisacodyl Take 2 tablets (10 mg total) by mouth daily as needed for moderate constipation.   CALCIUM 600 + D PO Take 1 tablet by mouth daily.   cholecalciferol 25 MCG (1000 UNIT) tablet Commonly known as: VITAMIN D3 Take 1,000 Units by mouth daily.   clopidogrel 75 MG tablet Commonly known as: PLAVIX Take 1 tablet (75 mg total) by mouth daily.   cyanocobalamin 1000 MCG tablet Commonly known as: VITAMIN B12 Take 1,000 mcg by mouth daily.   Dexcom G7 Sensor Misc 1 Device by Does not apply route every 30 (thirty) days.   Dexcom G6 Sensor Misc 1 Device by Does not apply route as directed.   Dexcom G7 Receiver Devi by Does not apply route.   docusate sodium 100 MG capsule Commonly known as: COLACE Take 2 capsules (200 mg total) by mouth daily.   EYE ALLERGY RELIEF OP Place 1 drop into both eyes daily as needed (allergies).   fexofenadine 180 MG tablet Commonly known as: ALLEGRA Take 180 mg by mouth daily as needed for allergies.   fluticasone 50 MCG/ACT nasal spray Commonly known as: FLONASE Place 1 spray into both nostrils as needed for allergies.   furosemide 40 MG tablet Commonly known as: LASIX TAKE 1 TABLET BY MOUTH EVERY DAY   glucose blood test strip 1 each by Other route 4 (four) times daily -  before meals and at bedtime. Use as instructed   HAIR/SKIN/NAILS PO Take 1 capsule by mouth daily.   insulin glargine-yfgn 100 UNIT/ML Pen Commonly known as: SEMGLEE Inject 32 Units  into the skin daily.   Insulin Pen Needle 32G X 4 MM Misc 1 Device by Does not apply route in the morning, at noon, in the evening, and at bedtime.   loperamide 2 MG tablet Commonly known as: IMODIUM A-D Take 1-2 mg by mouth 4 (four) times daily as needed for diarrhea or loose stools.   losartan 25 MG tablet Commonly known as: COZAAR Take 1 tablet (25 mg total) by mouth daily.   metoprolol succinate 25 MG 24 hr tablet Commonly known as: Toprol XL Take 1 tablet (25 mg total) by mouth daily.   nitroGLYCERIN 0.4 MG SL tablet Commonly known as: NITROSTAT Place 1 tablet (0.4 mg total) under the tongue every 5 (five) minutes as needed for chest pain.   NovoLOG FlexPen 100 UNIT/ML FlexPen Generic drug: insulin aspart Max daily 45 units per scale   Zinc 50 MG Caps Take 50 mg by mouth daily.         ALLERGIES: Allergies  Allergen Reactions   Codeine     Hyper with vomiting   Onion Diarrhea, Nausea And Vomiting and Other (See Comments)    syncope   Reglan [Metoclopramide]     "pulls muscle to side" "feel paralyzed"      OBJECTIVE:   VITAL SIGNS: BP 118/68 (BP Location: Left Arm, Patient Position: Sitting, Cuff Size: Small)   Pulse 69   Ht 5' (1.524 m)   Wt 150 lb 3.2 oz (68.1 kg)   SpO2 95%   BMI 29.33 kg/m    PHYSICAL EXAM:  General: Pt appears well and is in NAD  Neck: General: Supple without adenopathy or carotid bruits. Thyroid: Thyroid size normal.  No goiter or nodules appreciated. No thyroid bruit.  Lungs: Clear with good BS bilat with  no rales, rhonchi, or wheezes  Heart: RRR   Abdomen: Normoactive bowel sounds, soft, nontender, without masses or organomegaly palpable  Extremities:  Lower extremities - No pretibial edema. No lesions.  Neuro: MS is good with appropriate affect, pt is alert and Ox3    DM foot exam: 02/15/2022  The skin of the feet is intact without sores or ulcerations. The pedal pulses are 2+ on right and 2+ on left. The sensation is  intact to a screening 5.07, 10 gram monofilament bilaterally   DATA REVIEWED:  Lab Results  Component Value Date   HGBA1C 6.9 (A) 06/26/2022   HGBA1C 8.0 (A) 02/15/2022   HGBA1C 7.8 (H) 10/09/2021     Lab Results  Component Value Date   CHOL 106 06/23/2022   HDL 46 06/23/2022   LDLCALC 39 06/23/2022   TRIG 116 06/23/2022   CHOLHDL 2.3 06/23/2022        Latest Reference Range & Units 12/28/21 10:23  Sodium 135 - 145 mmol/L 135  Potassium 3.5 - 5.1 mmol/L 4.2  Chloride 98 - 111 mmol/L 94 (L)  CO2 22 - 32 mmol/L 32  Glucose 70 - 99 mg/dL 208 (H)  BUN 8 - 23 mg/dL 34 (H)  Creatinine 0.44 - 1.00 mg/dL 1.37 (H)  Calcium 8.9 - 10.3 mg/dL 9.4  Anion gap 5 - 15  9  Alkaline Phosphatase 38 - 126 U/L 104  Albumin 3.5 - 5.0 g/dL 4.1  AST 15 - 41 U/L 30  ALT 0 - 44 U/L 17  Total Protein 6.5 - 8.1 g/dL 7.8    ASSESSMENT / PLAN / RECOMMENDATIONS:   1) Type 1 Diabetes Mellitus, Optimally controlled, With CKD III and Macrovascular  complications - Most recent A1c of 6.9 %. Goal A1c < 7.0 %.    -Praised the patient improved glycemic control -In reviewing her Dexcom download the patient has been noted with persistent hyperglycemia overnight, I will increase her basal insulin as below -She has also been noted with postprandial hyperglycemia, followed by occasional hypoglycemia.  Unfortunately she takes her NovoLog after the meal which makes it difficult to optimize glucose readings during the day -I will reduce her prandial dose of NovoLog, I have also asked her to use the correction scale for Premeal BG reading rather than postmeal BG reading to reduce the risk of hypoglycemia  MEDICATIONS:  Increase Semglee 35 units daily Decrease NovoLog 6 units with each meal 3 times daily before every meal Correction factor: NovoLog (BG -130/30)   EDUCATION / INSTRUCTIONS: BG monitoring instructions: Patient is instructed to check her blood sugars 3 times a day, before meals. Call [facility] clinic if: BG persistently < 70  I reviewed the Rule of 15 for the treatment of hypoglycemia in detail with the patient. Literature supplied.   2) Diabetic complications:  Eye: Does not have known diabetic retinopathy.  Neuro/ Feet: Does not have known diabetic peripheral neuropathy. Renal: Patient does  have known baseline CKD. She is  on an ACEI/ARB at present.   Follow-up in 6 months Signed electronically by: Mack Guise, MD  [facility]  [facility] [address] [place], [place] [address] Phone: [phone] FAX: [phone]   CC: Ernestene Kiel, MD [address] [place] [address] Phone: [phone]  Fax: [phone]    Return to Endocrinology clinic as below: Future Appointments  Date Time Provider [place]  12/08/2022  9:35 AM [facility] ECHO 1 [facility]  12/08/2022 10:30 AM [facility]  12/18/2022  9:00 AM Crenshaw, Denice Bors, MD [facility] None  12/26/2022 10:10 AM Cornie Mccomber, Melanie Crazier, MD [facility] None  12/29/2022 10:00 AM [facility] None  12/29/2022 10:30 AM Cammie Sickle, MD [facility] None

## 2022-06-26 ENCOUNTER — Ambulatory Visit: Payer: Medicare HMO | Admitting: Internal Medicine

## 2022-06-26 ENCOUNTER — Encounter: Payer: Self-pay | Admitting: Internal Medicine

## 2022-06-26 ENCOUNTER — Other Ambulatory Visit: Payer: Self-pay | Admitting: Internal Medicine

## 2022-06-26 VITALS — BP 118/68 | HR 69 | Ht 60.0 in | Wt 150.2 lb

## 2022-06-26 DIAGNOSIS — E1065 Type 1 diabetes mellitus with hyperglycemia: Secondary | ICD-10-CM | POA: Diagnosis not present

## 2022-06-26 DIAGNOSIS — N1831 Chronic kidney disease, stage 3a: Secondary | ICD-10-CM | POA: Diagnosis not present

## 2022-06-26 DIAGNOSIS — E1022 Type 1 diabetes mellitus with diabetic chronic kidney disease: Secondary | ICD-10-CM | POA: Diagnosis not present

## 2022-06-26 DIAGNOSIS — E1059 Type 1 diabetes mellitus with other circulatory complications: Secondary | ICD-10-CM

## 2022-06-26 LAB — POCT GLYCOSYLATED HEMOGLOBIN (HGB A1C): Hemoglobin A1C: 6.9 % — AB (ref 4.0–5.6)

## 2022-06-26 MED ORDER — INSULIN GLARGINE-YFGN 100 UNIT/ML ~~LOC~~ SOPN: 35.0000 [IU] | PEN_INJECTOR | Freq: Every day | SUBCUTANEOUS | 6 refills | Status: DC

## 2022-06-26 NOTE — Patient Instructions (Addendum)
-   Increase Semglee to  35 units once daily  - Change Novolog 6 units with each meal PLUS additional if needed based on the scale below  -Novolog correctional insulin: ADD extra units on insulin to your meal-time Novolog dose if your blood sugars are higher than 160. Use the scale below to help guide you:   Blood sugar before meal Number of units to inject  Less than 160 0 unit  161 -  190 1 units  191 -  220 2 units  221 -  250 3 units  251 -  280 4 units  281 -  310 5 units  311 -  340 6 units  341 -  370 7 units  371 -  400 8 units  401 - 430 9 units   431 - 460 10 units      HOW TO TREAT LOW BLOOD SUGARS (Blood sugar LESS THAN 70 MG/DL) Please follow the RULE OF 15 for the treatment of hypoglycemia treatment (when your (blood sugars are less than 70 mg/dL)   STEP 1: Take 15 grams of carbohydrates when your blood sugar is low, which includes:  3-4 GLUCOSE TABS  OR 3-4 OZ OF JUICE OR REGULAR SODA OR ONE TUBE OF GLUCOSE GEL    STEP 2: RECHECK blood sugar in 15 MINUTES STEP 3: If your blood sugar is still low at the 15 minute recheck --> then, go back to STEP 1 and treat AGAIN with another 15 grams of carbohydrates.

## 2022-06-30 ENCOUNTER — Other Ambulatory Visit: Payer: Self-pay | Admitting: *Deleted

## 2022-06-30 DIAGNOSIS — N2889 Other specified disorders of kidney and ureter: Secondary | ICD-10-CM

## 2022-07-21 ENCOUNTER — Ambulatory Visit
Admission: RE | Admit: 2022-07-21 | Discharge: 2022-07-21 | Disposition: A | Discharge status: Home / Self Care | Payer: Medicare HMO | Source: Ambulatory Visit | Attending: Cardiology | Admitting: Cardiology

## 2022-07-21 DIAGNOSIS — N2889 Other specified disorders of kidney and ureter: Secondary | ICD-10-CM

## 2022-07-21 MED ORDER — IOPAMIDOL (ISOVUE-300) INJECTION 61%
100.0000 mL | Freq: Once | INTRAVENOUS | Status: AC | PRN
  Administered 2022-07-21: 100 mL via INTRAVENOUS

## 2022-07-24 LAB — BASIC METABOLIC PANEL
BUN/Creatinine Ratio: 20 (ref 12–28)
BUN: 28 mg/dL — ABNORMAL HIGH (ref 8–27)
CO2: 29 mmol/L (ref 20–29)
Calcium: 9.3 mg/dL (ref 8.7–10.3)
Chloride: 101 mmol/L (ref 96–106)
Creatinine, Ser: 1.37 mg/dL — ABNORMAL HIGH (ref 0.57–1.00)
Glucose: 115 mg/dL — ABNORMAL HIGH (ref 70–99)
Potassium: 4.7 mmol/L (ref 3.5–5.2)
Sodium: 139 mmol/L (ref 134–144)
eGFR: 41 mL/min/{1.73_m2} — ABNORMAL LOW (ref >59)

## 2022-08-01 ENCOUNTER — Other Ambulatory Visit: Payer: Self-pay | Admitting: Cardiology

## 2022-08-01 NOTE — Telephone Encounter (Signed)
This is Dr. Crenshaw's pt 

## 2022-09-06 ENCOUNTER — Other Ambulatory Visit: Payer: Self-pay | Admitting: *Deleted

## 2022-09-06 ENCOUNTER — Encounter: Payer: Self-pay | Admitting: *Deleted

## 2022-09-06 DIAGNOSIS — I7122 Aneurysm of the aortic arch, without rupture: Secondary | ICD-10-CM

## 2022-09-20 DIAGNOSIS — I509 Heart failure, unspecified: Secondary | ICD-10-CM | POA: Insufficient documentation

## 2022-09-22 ENCOUNTER — Other Ambulatory Visit: Payer: Self-pay | Admitting: Cardiovascular Disease

## 2022-09-22 ENCOUNTER — Other Ambulatory Visit: Payer: Self-pay | Admitting: Physician Assistant

## 2022-09-30 IMAGING — MR MR ABDOMEN WO/W CM
18 series · 48 of 48 positions shown · IV contrast (gadavist)
Comparison: Abdominal CTA 10/12/2021. Chest CT 01/15/2021 and
abdominopelvic CT 12/30/2009.

CLINICAL DATA: Indeterminate heterogeneously enhancing left renal
masses on CTA.

EXAM:
MRI ABDOMEN WITHOUT AND WITH CONTRAST
TECHNIQUE: Multiplanar multisequence MR imaging of the abdomen was performed
both before and after the administration of intravenous contrast.
CONTRAST:  6mL GADAVIST GADOBUTROL 1 MMOL/ML IV SOLN

[Series 4: cor haste · coronal · 6.0mm · 1.25mm/px · 2 of 30 slices shown]
[im 1/30]
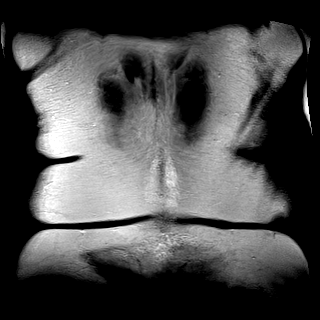
[im 30/30]
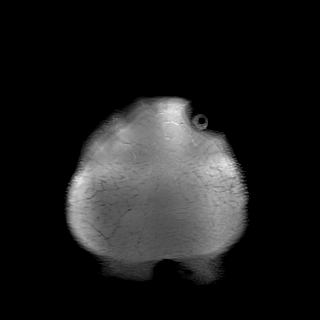

[Series 5: ax haste · axial · 6.0mm · 1.25mm/px · z∈[-176,+90]mm · 2 of 38 slices shown]
[im 1/38]
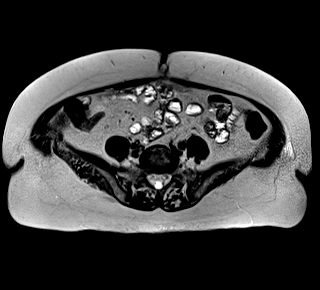
[im 38/38]
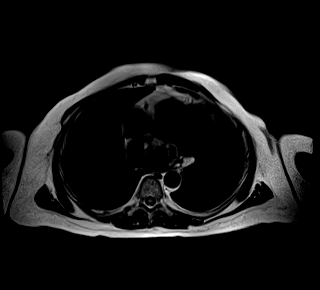

[Series 7: t1_vibe_opp-in_tra_p4_bh · axial · 3.0mm · 1.25mm/px · z∈[-174,+87]mm · 4 of 88 slices shown (1 of 2)]
[im 1/88]
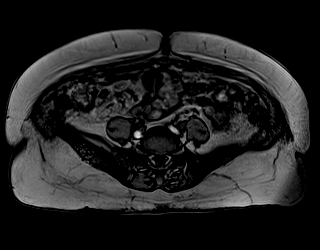
[im 30/88]
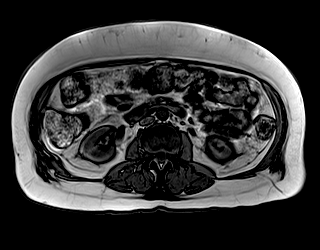
[im 59/88]
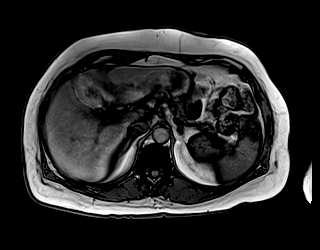
[im 88/88]
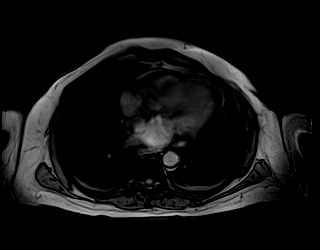

[Series 7: t1_vibe_opp-in_tra_p4_bh · axial · 3.0mm · 1.25mm/px · z∈[-174,+87]mm · 3 of 88 slices shown (2 of 2)]
[im 1/88]
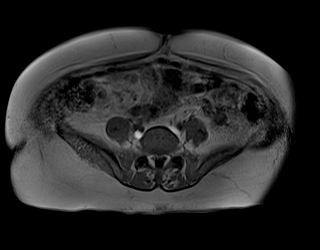
[im 44/88]
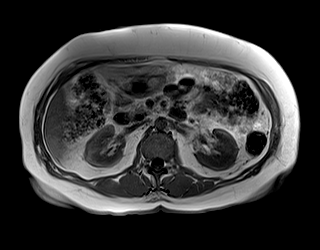
[im 88/88]
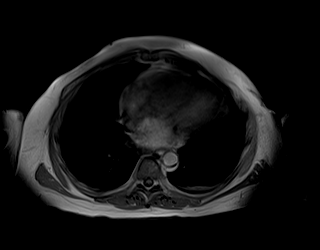

[Series 8: T2 fat-sat · axial · 6.0mm · 1.25mm/px · 1 of 38 slices shown]
[im 1/38]
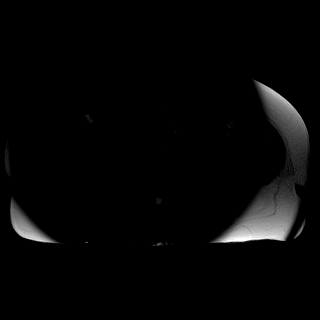

[Series 9: DWI · axial · 6.0mm · 1.49mm/px · z∈[-176,+90]mm · 4 of 114 slices shown (1 of 2)]
[im 1/114]
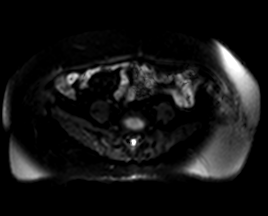
[im 38/114]
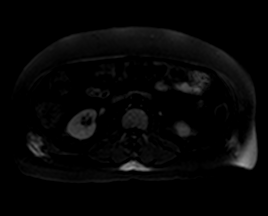
[im 76/114]
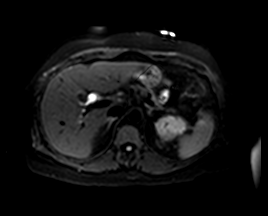
[im 114/114]
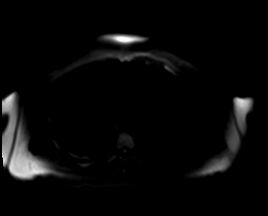

[Series 10: DWI · axial · 6.0mm · 1.49mm/px · 1 of 38 slices shown (2 of 2)]
[im 1/38]
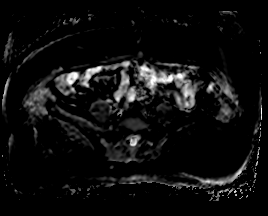

[Series 11: bSSFP · axial · 6.0mm · 0.78mm/px · 1 of 38 slices shown]
[im 1/38]
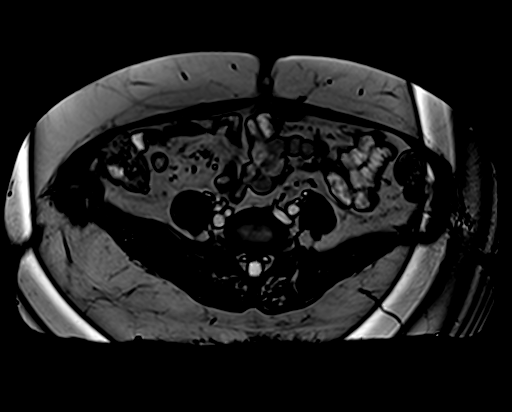

[Series 12: t1_vibe_fs_tra_p4_bh_pre · axial · 3.0mm · 1.25mm/px · z∈[-171,+90]mm · 3 of 88 slices shown]
[im 1/88]
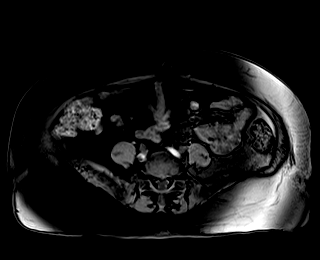
[im 44/88]
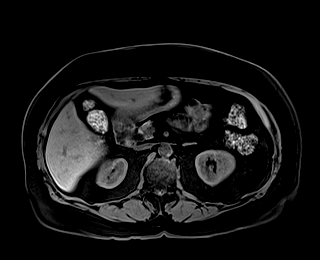
[im 88/88]
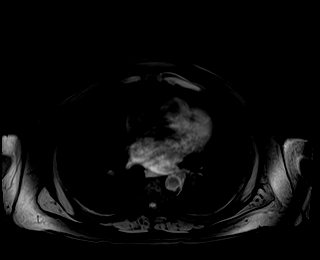

[Series 14: t1_vibe_fs_tra_p4_bh_post · axial · 3.0mm · 1.25mm/px · z∈[-171,+90]mm · 3 of 88 slices shown (1 of 4)]
[im 1/88]
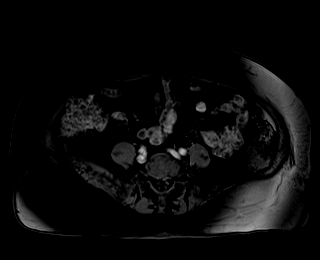
[im 44/88]
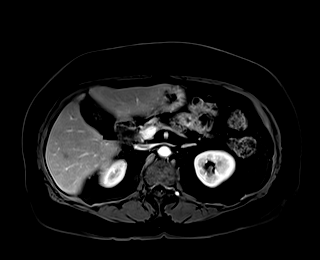
[im 88/88]
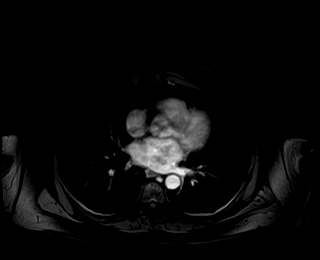

[Series 15: t1_vibe_fs_tra_p4_bh_post_sub · axial · 3.0mm · 1.25mm/px · z∈[-171,+90]mm · 3 of 88 slices shown (1 of 4)]
[im 1/88]
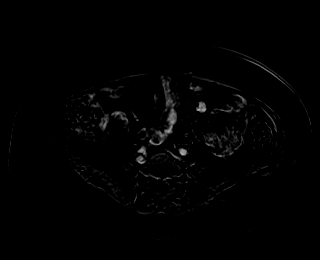
[im 44/88]
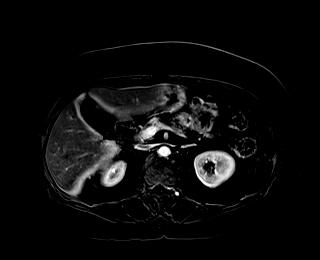
[im 88/88]
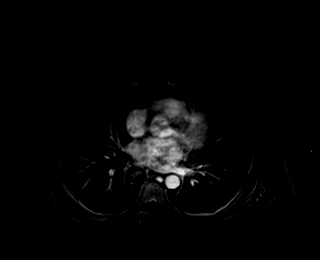

[Series 16: t1_vibe_fs_tra_p4_bh_post · axial · 3.0mm · 1.25mm/px · z∈[-171,+90]mm · 3 of 88 slices shown (2 of 4)]
[im 1/88]
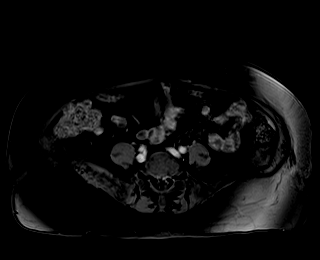
[im 44/88]
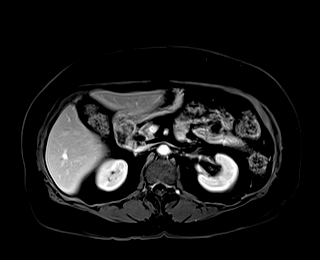
[im 88/88]
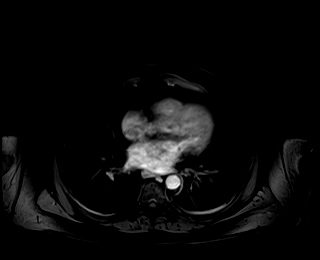

[Series 17: t1_vibe_fs_tra_p4_bh_post_sub · axial · 3.0mm · 1.25mm/px · z∈[-171,+90]mm · 3 of 88 slices shown (2 of 4)]
[im 1/88]
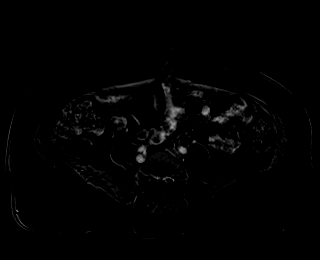
[im 44/88]
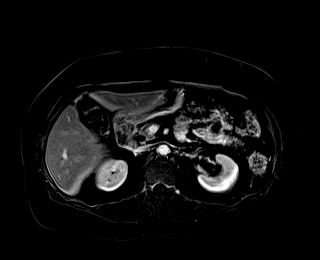
[im 88/88]
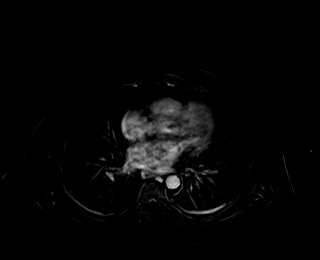

[Series 18: t1_vibe_fs_tra_p4_bh_post · axial · 3.0mm · 1.25mm/px · z∈[-171,+90]mm · 3 of 88 slices shown (3 of 4)]
[im 1/88]
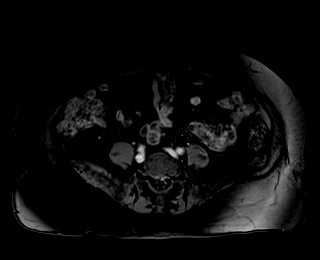
[im 44/88]
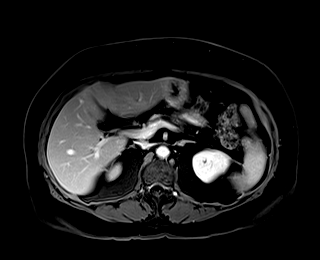
[im 88/88]
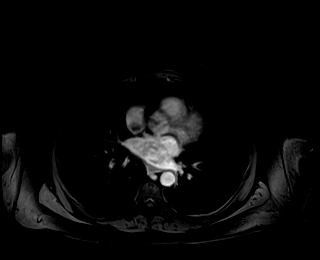

[Series 19: t1_vibe_fs_tra_p4_bh_post_sub · axial · 3.0mm · 1.25mm/px · z∈[-171,+90]mm · 3 of 88 slices shown (3 of 4)]
[im 1/88]
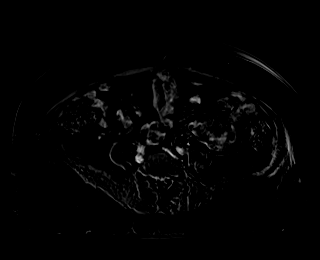
[im 44/88]
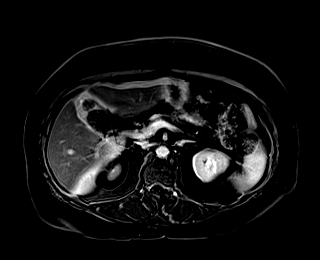
[im 88/88]
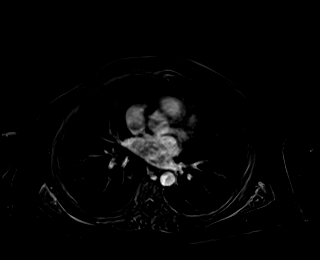

[Series 20: t1_vibe_fs_tra_p4_bh_post · axial · 3.0mm · 1.25mm/px · z∈[-171,+90]mm · 3 of 88 slices shown (4 of 4)]
[im 1/88]
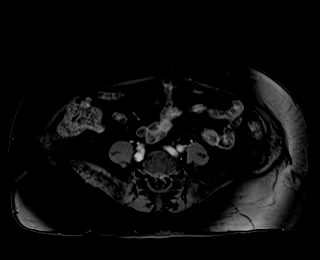
[im 44/88]
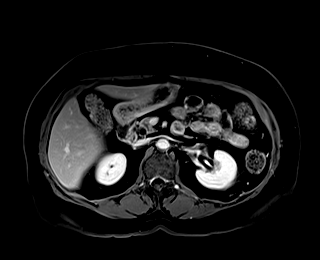
[im 88/88]
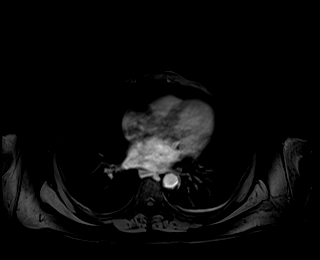

[Series 21: t1_vibe_fs_tra_p4_bh_post_sub · axial · 3.0mm · 1.25mm/px · z∈[-171,+90]mm · 3 of 88 slices shown (4 of 4)]
[im 1/88]
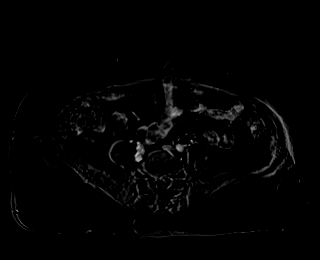
[im 44/88]
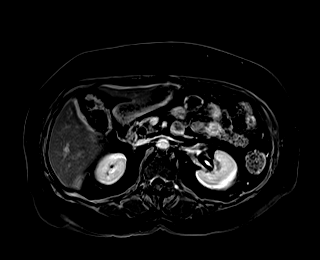
[im 88/88]
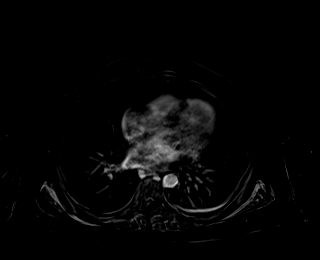

[Series 22: T1 dynamic post-contrast · coronal · 3.0mm · 1.25mm/px · 3 of 72 slices shown]
[im 1/72]
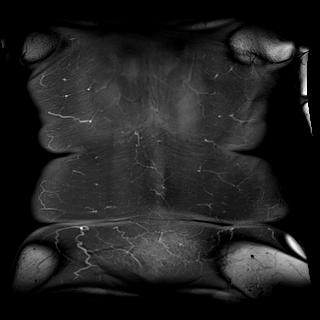
[im 36/72]
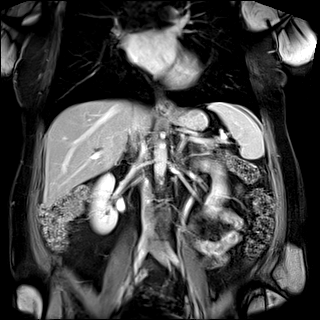
[im 72/72]
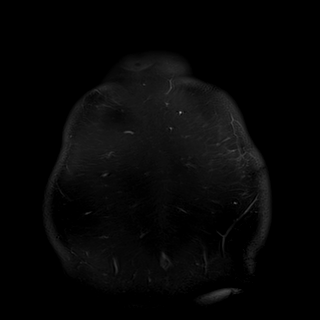

[48 of 48 positions shown; findings below may reference images not displayed]

FINDINGS: Lower chest: Mild atelectasis at the lung bases. No significant
pleural effusion.

Hepatobiliary: The liver is normal in signal without focal
abnormality or abnormal enhancement. There is stable mild
extrahepatic biliary dilatation status post cholecystectomy. No
evidence of choledocholithiasis.

Pancreas: Mild atrophy. No evidence of focal mass lesion, ductal
dilatation or surrounding inflammation.

Spleen: Normal in size without focal abnormality.

Adrenals/Urinary Tract: Both adrenal glands appear normal.
Corresponding with the areas questioned on recent CT is mild
restricted diffusion in the upper pole of the left kidney measuring
2.7 cm on image 91/9 and in the posterior interpolar region,
measuring 1.8 cm on image 96/9. However, no abnormal T2 signal or
enhancement is seen in these areas. There are small renal cortical
cysts bilaterally, largest in the interpolar region of the right
kidney measuring 1.1 cm. No evidence of hydronephrosis or
perinephric soft tissue stranding.

Stomach/Bowel: Mild wall thickening and mucosal hyperenhancement of
the distal esophagus. The stomach and visualized small bowel appear
unremarkable. There is moderate stool throughout the colon.

Vascular/Lymphatic: There are no enlarged abdominal lymph nodes.
Aortic and branch vessel atherosclerosis, better seen on CT. No
evidence of large vessel occlusion or aneurysm. The portal, superior
mesenteric, splenic and renal veins appear normal.

Other: No ascites.

Musculoskeletal: No acute or significant osseous findings. There is
a hemangioma in the T9 vertebral body.
IMPRESSION: 1. No focal mass lesion or abnormal enhancement in the left kidney
to correspond with the recent findings on CT. There is mild
restricted diffusion in these areas, and focal pyelonephritis is the
favored etiology. Small renal cortical cysts are present. Recommend
follow-up abdominopelvic CT without and with contrast in 3-6 months.
2. Stable extrahepatic biliary dilatation status post
cholecystectomy.
3. Mild wall thickening and mucosal hyperenhancement of the distal
esophagus, suggesting reflux-mediated esophagitis.
4.  Aortic Atherosclerosis ([id number]).

## 2022-10-23 ENCOUNTER — Ambulatory Visit
Admission: RE | Admit: 2022-10-23 | Discharge: 2022-10-23 | Disposition: A | Discharge status: Home / Self Care | Payer: Medicare HMO | Source: Ambulatory Visit | Attending: Cardiology | Admitting: Cardiology

## 2022-10-23 DIAGNOSIS — I7122 Aneurysm of the aortic arch, without rupture: Secondary | ICD-10-CM

## 2022-10-23 MED ORDER — IOPAMIDOL (ISOVUE-370) INJECTION 76%
75.0000 mL | Freq: Once | INTRAVENOUS | Status: AC | PRN
  Administered 2022-10-23: 75 mL via INTRAVENOUS

## 2022-11-20 ENCOUNTER — Other Ambulatory Visit: Payer: Self-pay | Admitting: Cardiovascular Disease

## 2022-12-04 NOTE — Progress Notes (Signed)
HPI: Follow-up coronary artery disease and previous TAVR.  Carotid Dopplers October 2022 showed 1 to 39% bilateral stenosis.  Patient has a history of PCI of her LAD.  Last catheterization January 2023 showed 90% RPAV 50% RCA, 75% ostial to proximal LAD, patent stent in the LAD; FFR of RCA negative but positive in the LAD.  Patient had PCI of the LAD at that time.  Patient also with history of severe aortic stenosis secondary to bicuspid aortic valve. Patient underwent TAVR with a 23 mm Edwards SAPIEN 3 ultra Resilia THV November 08, 2021.  Echocardiogram February 2024 showed normal LV function, mild left atrial enlargement, mild mitral regurgitation, status post TAVR with mean gradient 8 mmHg and trace aortic insufficiency.  Since last seen   Current Outpatient Medications  Medication Sig Dispense Refill   acetaminophen (TYLENOL) 500 MG tablet Take 1,000 mg by mouth every 6 (six) hours as needed for moderate pain or headache.     amLODipine (NORVASC) 2.5 MG tablet TAKE 1 TABLET BY MOUTH EVERY DAY 90 tablet 3   amoxicillin (AMOXIL) 500 MG capsule Take 4 capsules by mouth 1 hour prior to dental appointments 4 capsule 3   aspirin EC 81 MG tablet Take 81 mg by mouth at bedtime.     atorvastatin (LIPITOR) 80 MG tablet TAKE 1 TABLET BY MOUTH EVERYDAY AT BEDTIME 90 tablet 3   BIOTIN PO Take by mouth daily.     Biotin w/ Vitamins C & E (HAIR/SKIN/NAILS PO) Take 1 capsule by mouth daily.     bisacodyl (DULCOLAX) 5 MG EC tablet Take 2 tablets (10 mg total) by mouth daily as needed for moderate constipation. 30 tablet 0   Calcium Carb-Cholecalciferol (CALCIUM 600 + D PO) Take 1 tablet by mouth daily.     cholecalciferol (VITAMIN D) 25 MCG (1000 UNIT) tablet Take 1,000 Units by mouth daily.     Continuous Blood Gluc Sensor (DEXCOM G7 SENSOR) MISC 1 Device by Does not apply route every 30 (thirty) days.     docusate sodium (COLACE) 100 MG capsule Take 2 capsules (200 mg total) by mouth daily. 10 capsule 0    fexofenadine (ALLEGRA) 180 MG tablet Take 180 mg by mouth daily as needed for allergies.     fluticasone (FLONASE) 50 MCG/ACT nasal spray Place 1 spray into both nostrils as needed for allergies.     glucose blood test strip 1 each by Other route 4 (four) times daily -  before meals and at bedtime. Use as instructed     insulin aspart (NOVOLOG FLEXPEN) 100 UNIT/ML FlexPen Max daily 45 units per scale 45 mL 6   insulin degludec (TRESIBA FLEXTOUCH) 100 UNIT/ML FlexTouch Pen Inject 35 Units into the skin daily. 30 mL 4   Insulin Pen Needle 32G X 4 MM MISC 1 Device by Does not apply route in the morning, at noon, in the evening, and at bedtime. 400 each 3   loperamide (IMODIUM A-D) 2 MG tablet Take 1-2 mg by mouth 4 (four) times daily as needed for diarrhea or loose stools.     losartan (COZAAR) 25 MG tablet Take 1 tablet (25 mg total) by mouth daily. 90 tablet 3   metoprolol succinate (TOPROL XL) 25 MG 24 hr tablet Take 1 tablet (25 mg total) by mouth daily. 90 tablet 3   Naphazoline-Pheniramine (EYE ALLERGY RELIEF OP) Place 1 drop into both eyes daily as needed (allergies).     nitroGLYCERIN (NITROSTAT) 0.4 MG SL  tablet Place 1 tablet (0.4 mg total) under the tongue every 5 (five) minutes as needed for chest pain. 25 tablet 3   vitamin B-12 (CYANOCOBALAMIN) 1000 MCG tablet Take 1,000 mcg by mouth daily.     vitamin C (ASCORBIC ACID) 500 MG tablet Take 500 mg by mouth daily.     Zinc 50 MG CAPS Take 50 mg by mouth daily.     No current facility-administered medications for this visit.     Past Medical History:  Diagnosis Date   Alopecia    Anemia    Arthritis    Bicuspid aortic valve    mild to moderate AS by echo 2022   Chronic diastolic (congestive) heart failure (HCC)    Coronary arteriosclerosis in native artery    s/p PCI of LAD in 2013 & NSTEMI with staged DES to pLAD (10/2021)   DM (diabetes mellitus), type 1 (HCC)    GERD (gastroesophageal reflux disease)    History of breast  cancer    Estrogen receptor negative. s/p chemo   Hyperglycemia    Hyperlipidemia    Hypertension    Mitral regurgitation    mild to moderate by echo 2020   Myocardial infarction [facility]) 10/09/2021   S/P TAVR (transcatheter aortic valve replacement) 11/08/2021   s/p TAVR with a 23 mm Edwards S3UR via the TF approach by Dr. Burt Knack and Dr. Cyndia Bent   Tendinitis of left wrist     Past Surgical History:  Procedure Laterality Date   ANKLE SURGERY Bilateral    CATARACT EXTRACTION, BILATERAL     CHOLECYSTECTOMY     CORONARY STENT INTERVENTION     CORONARY STENT INTERVENTION N/A 10/21/2021   Procedure: CORONARY STENT INTERVENTION;  Surgeon: Sherren Mocha, MD;  Location: [facility];  Service: Cardiovascular;  Laterality: N/A;   CORONARY/GRAFT ACUTE MI REVASCULARIZATION N/A 10/10/2021   Procedure: CORONARY/GRAFT ACUTE MI REVASCULARIZATION;  Surgeon: Lorretta Harp, MD;  Location: [place] [facility];  Service: Cardiovascular;  Laterality: N/A;   CTR     ELBOW SURGERY     HAND SURGERY     BOTH THUMBS AND MIDDLE FINGERS   INTRAOPERATIVE TRANSTHORACIC ECHOCARDIOGRAM N/A 11/08/2021   Procedure: INTRAOPERATIVE TRANSTHORACIC ECHOCARDIOGRAM;  Surgeon: Sherren Mocha, MD;  Location: [facility];  Service: Open Heart Surgery;  Laterality: N/A;   INTRAVASCULAR PRESSURE WIRE/FFR STUDY N/A 10/21/2021   Procedure: INTRAVASCULAR PRESSURE WIRE/FFR STUDY;  Surgeon: Sherren Mocha, MD;  Location: [facility];  Service: Cardiovascular;  Laterality: N/A;   LEFT BREAST MASTECTOMY     MASCULAR  REPAIR     PORT-A-CATH REMOVAL     PORTACATH PLACEMENT     RIGHT MODIFIED MASTECTOMY Right    TRANSCATHETER AORTIC VALVE REPLACEMENT, TRANSFEMORAL N/A 11/08/2021   Procedure: TRANSCATHETER AORTIC VALVE REPLACEMENT, TRANSFEMORAL;  Surgeon: Sherren Mocha, MD;  Location: [facility];  Service: Open Heart Surgery;  Laterality: N/A;   UPPER LIP SURGERY      Social History   Socioeconomic  History   Marital status: Widowed    Spouse name: Not on file   Number of children: Not on file   Years of education: Not on file   Highest education level: Not on file  Occupational History   Not on file  Tobacco Use   Smoking status: Former    Years: 3.00    Types: Cigarettes    Quit date: 08/15/1979    Years since quitting: 43.3   Smokeless tobacco: Never  Tobacco comments:    1 pack would last a week.  Vaping Use   Vaping Use: Never used  Substance and Sexual Activity   Alcohol use: Never   Drug use: Never   Sexual activity: Never  Other Topics Concern   Not on file  Social History Narrative   Not on file   Social Determinants of Health   Financial Resource Strain: Not on file  Food Insecurity: Not on file  Transportation Needs: Not on file  Physical Activity: Not on file  Stress: Not on file  Social Connections: Not on file  Intimate Partner Violence: Not on file    Family History  Problem Relation Age of Onset   Heart attack Mother    CAD Father        CABG   Diabetes Father    Stroke Father    Colon cancer Father        52s   Diabetes Brother    Diabetes Paternal Grandfather    Heart attack Paternal Grandfather    Breast cancer Maternal Aunt        102s    ROS: no fevers or chills, productive cough, hemoptysis, dysphasia, odynophagia, melena, hematochezia, dysuria, hematuria, Frazzini, seizure activity, orthopnea, PND, pedal edema, claudication. Remaining systems are negative.  Physical Exam: Well-developed well-nourished in no acute distress.  Skin is warm and dry.  HEENT is normal.  Neck is supple.  Chest is clear to auscultation with normal expansion.  Cardiovascular exam is regular rate and rhythm.  Abdominal exam nontender or distended. No masses palpated. Extremities show no edema. neuro grossly intact   A/P  1 status post TAVR-continue SBE prophylaxis.  2 history of moderate mitral stenosis-most recent echo did not show evidence of  mitral stenosis.  We will likely plan follow-up study in 1 year.  3 coronary artery disease-continue aspirin and statin.  4 hypertension-blood pressure controlled.  Continue present medical regimen.  Check potassium and renal function.  5 hyperlipidemia-continue statin.  Check lipids and liver.  6 chronic diastolic congestive heart failure-she remains euvolemic on examination; diuretic discontinued.  Will follow.  Kirk Ruths, MD

## 2022-12-07 NOTE — Progress Notes (Signed)
[facility]                                     Cardiology Office Note:    Date:  12/08/2022   ID:  Molly Hill, DOB 02-Jun-1951, MRN [id number]  PCP:  Ernestene Kiel, MD  [facility] Cardiologist:  Kirk Ruths, MD / Dr. Burt Knack & Dr. Cyndia Bent (TAVR)  [facility] Electrophysiologist:  None   Referring MD: Ernestene Kiel, MD   Chief Complaint  Patient presents with   Follow-up    1 year s/p TAVR   History of Present Illness:    Molly Hill is a 72 y.o. female with a hx of  breast CA s/p radiation to the right and bilateral mastectomy, DMT1, HTN, HLD, tobacco abuse, anemia, recent admission for NSTEMI and acute CHF in the setting of E.Coli bacteremia and pyelonephritis (09/2021), CAD s/p remote PCI of LAD in 2013 and NSTEMI with staged DES to pLAD (10/21/21), bicuspid AoV disease with severe AS/mild AI s/p TAVR (11/08/21) who presents to clinic for one year follow up.    The patient had noticed decreased exercise tolerance, fatigue, DOE and chest pain. She was admitted 12/25-12/30/22 for chest pain and above symptoms. She was found to have an NSTEMI and acute CHF in the setting of E.Coli bacteremia and pyelonephritis.  Echo showed LVEF 60-65% with severe aortic stenosis with a mean gradient of 40 mm hg, AVA 0.61 and DVI of 0.21. Cardiac catheterization 12/26 revealed 75% ostial right coronary artery, 90% RP AV and 40% ostial to proximal LAD; left ventricular end-diastolic pressure 8 mmHg. She was evaluated for [facility] but felt to be too high risk given radiation changes in the right apex along with ascending aorta calcifications extending from the root to the arch felt to be secondary to chest radiation. She underwent pre TAVR scans while admitted. Plan was for discharge home with ABx and to return for staged PCI and FFR followed by eventual TAVR.   Staged cath 10/21/21 showed negative FFR evaluation of the RCA, moderate  nonobstructive lesion and severe flow-limiting diffuse calcific proximal LAD stenosis of 75%, strongly positive RFR of 0.75, treated successfully with high-pressure noncompliant balloon angioplasty and stenting using a 2.75 x 26 mm Onyx frontier DES optimized with IVUS imaging. She was discharged on aspirin and plavix.   She underwent successful TAVR with a 23 mm Edwards Sapien 3 Ultra Resilia THV via the TF approach on 11/08/21. One CC was removed with deployment. Post operative echo showed EF 65%, normally functioning TAVR with a mean gradient of 7 mmHg and no PVL and mild mitral stenosis. She was discharged on continued aspirin and Plavix. At follow up BP was elevated and Losartan started.   Today she reports that she has been doing very well since her procedure. She has more energy and no SOB. She denies chest pain, palpitations, LE edema, orthopnea, dizziness, bleeding in stool or urine, or syncope.    Past Medical History:  Diagnosis Date   Alopecia    Anemia    Arthritis    Bicuspid aortic valve    mild to moderate AS by echo 2022   Chronic diastolic (congestive) heart failure (HCC)    Coronary arteriosclerosis in native artery    s/p PCI of LAD in 2013 & NSTEMI with staged DES to pLAD (10/2021)   DM (diabetes  mellitus), type 1 (HCC)    GERD (gastroesophageal reflux disease)    History of breast cancer    Estrogen receptor negative. s/p chemo   Hyperglycemia    Hyperlipidemia    Hypertension    Mitral regurgitation    mild to moderate by echo 2020   Myocardial infarction [facility]) 10/09/2021   S/P TAVR (transcatheter aortic valve replacement) 11/08/2021   s/p TAVR with a 23 mm Edwards S3UR via the TF approach by Dr. Burt Knack and Dr. Cyndia Bent   Tendinitis of left wrist     Past Surgical History:  Procedure Laterality Date   ANKLE SURGERY Bilateral    CATARACT EXTRACTION, BILATERAL     CHOLECYSTECTOMY     CORONARY STENT INTERVENTION     CORONARY STENT INTERVENTION N/A 10/21/2021    Procedure: CORONARY STENT INTERVENTION;  Surgeon: Sherren Mocha, MD;  Location: [place] [facility];  Service: Cardiovascular;  Laterality: N/A;   CORONARY/GRAFT ACUTE MI REVASCULARIZATION N/A 10/10/2021   Procedure: CORONARY/GRAFT ACUTE MI REVASCULARIZATION;  Surgeon: Lorretta Harp, MD;  Location: [place] [facility];  Service: Cardiovascular;  Laterality: N/A;   CTR     ELBOW SURGERY     HAND SURGERY     BOTH THUMBS AND MIDDLE FINGERS   INTRAOPERATIVE TRANSTHORACIC ECHOCARDIOGRAM N/A 11/08/2021   Procedure: INTRAOPERATIVE TRANSTHORACIC ECHOCARDIOGRAM;  Surgeon: Sherren Mocha, MD;  Location: [place] [facility];  Service: Open Heart Surgery;  Laterality: N/A;   INTRAVASCULAR PRESSURE WIRE/FFR STUDY N/A 10/21/2021   Procedure: INTRAVASCULAR PRESSURE WIRE/FFR STUDY;  Surgeon: Sherren Mocha, MD;  Location: [place] [facility];  Service: Cardiovascular;  Laterality: N/A;   LEFT BREAST MASTECTOMY     MASCULAR  REPAIR     PORT-A-CATH REMOVAL     PORTACATH PLACEMENT     RIGHT MODIFIED MASTECTOMY Right    TRANSCATHETER AORTIC VALVE REPLACEMENT, TRANSFEMORAL N/A 11/08/2021   Procedure: TRANSCATHETER AORTIC VALVE REPLACEMENT, TRANSFEMORAL;  Surgeon: Sherren Mocha, MD;  Location: [place] [facility];  Service: Open Heart Surgery;  Laterality: N/A;   UPPER LIP SURGERY      Current Medications: Current Meds  Medication Sig   acetaminophen (TYLENOL) 500 MG tablet Take 1,000 mg by mouth every 6 (six) hours as needed for moderate pain or headache.   amLODipine (NORVASC) 2.5 MG tablet TAKE 1 TABLET BY MOUTH EVERY DAY   amoxicillin (AMOXIL) 500 MG capsule Take 4 capsules by mouth 1 hour prior to dental appointments   aspirin EC 81 MG tablet Take 81 mg by mouth at bedtime.   atorvastatin (LIPITOR) 80 MG tablet TAKE 1 TABLET BY MOUTH EVERYDAY AT BEDTIME   BIOTIN PO Take by mouth daily.   Biotin w/ Vitamins C & E (HAIR/SKIN/NAILS PO) Take 1 capsule by mouth daily.   bisacodyl (DULCOLAX) 5 MG EC tablet  Take 2 tablets (10 mg total) by mouth daily as needed for moderate constipation.   Calcium Carb-Cholecalciferol (CALCIUM 600 + D PO) Take 1 tablet by mouth daily.   cholecalciferol (VITAMIN D) 25 MCG (1000 UNIT) tablet Take 1,000 Units by mouth daily.   Continuous Blood Gluc Sensor (DEXCOM G7 SENSOR) MISC 1 Device by Does not apply route every 30 (thirty) days.   docusate sodium (COLACE) 100 MG capsule Take 2 capsules (200 mg total) by mouth daily.   fexofenadine (ALLEGRA) 180 MG tablet Take 180 mg by mouth daily as needed for allergies.   fluticasone (FLONASE) 50 MCG/ACT nasal spray Place 1 spray into both nostrils as needed for  allergies.   furosemide (LASIX) 20 MG tablet Take 1 tablet (20 mg total) by mouth daily.   glucose blood test strip 1 each by Other route 4 (four) times daily -  before meals and at bedtime. Use as instructed   insulin aspart (NOVOLOG FLEXPEN) 100 UNIT/ML FlexPen Max daily 45 units per scale   insulin degludec (TRESIBA FLEXTOUCH) 100 UNIT/ML FlexTouch Pen Inject 35 Units into the skin daily.   Insulin Pen Needle 32G X 4 MM MISC 1 Device by Does not apply route in the morning, at noon, in the evening, and at bedtime.   loperamide (IMODIUM A-D) 2 MG tablet Take 1-2 mg by mouth 4 (four) times daily as needed for diarrhea or loose stools.   losartan (COZAAR) 25 MG tablet Take 1 tablet (25 mg total) by mouth daily.   metoprolol succinate (TOPROL XL) 25 MG 24 hr tablet Take 1 tablet (25 mg total) by mouth daily.   Naphazoline-Pheniramine (EYE ALLERGY RELIEF OP) Place 1 drop into both eyes daily as needed (allergies).   nitroGLYCERIN (NITROSTAT) 0.4 MG SL tablet Place 1 tablet (0.4 mg total) under the tongue every 5 (five) minutes as needed for chest pain.   vitamin B-12 (CYANOCOBALAMIN) 1000 MCG tablet Take 1,000 mcg by mouth daily.   vitamin C (ASCORBIC ACID) 500 MG tablet Take 500 mg by mouth daily.   Zinc 50 MG CAPS Take 50 mg by mouth daily.   [DISCONTINUED] clopidogrel  (PLAVIX) 75 MG tablet TAKE 1 TABLET BY MOUTH EVERY DAY   [DISCONTINUED] furosemide (LASIX) 40 MG tablet TAKE 1 TABLET BY MOUTH EVERY DAY     Allergies:   Codeine, Onion, and Reglan [metoclopramide]   Social History   Socioeconomic History   Marital status: Widowed    Spouse name: Not on file   Number of children: Not on file   Years of education: Not on file   Highest education level: Not on file  Occupational History   Not on file  Tobacco Use   Smoking status: Former    Years: 3.00    Types: Cigarettes    Quit date: 08/15/1979    Years since quitting: 43.3   Smokeless tobacco: Never   Tobacco comments:    1 pack would last a week.  Vaping Use   Vaping Use: Never used  Substance and Sexual Activity   Alcohol use: Never   Drug use: Never   Sexual activity: Never  Other Topics Concern   Not on file  Social History Narrative   Not on file   Social Determinants of Health   Financial Resource Strain: Not on file  Food Insecurity: Not on file  Transportation Needs: Not on file  Physical Activity: Not on file  Stress: Not on file  Social Connections: Not on file     Family History: The patient's family history includes Breast cancer in her maternal aunt; CAD in her father; Colon cancer in her father; Diabetes in her brother, father, and paternal grandfather; Heart attack in her mother and paternal grandfather; Stroke in her father.  ROS:   Please see the history of present illness.    All other systems reviewed and are negative.  EKGs/Labs/Other Studies Reviewed:    The following studies were reviewed today:   TAVR OPERATIVE NOTE     Date of Procedure:                11/08/2021   Preoperative Diagnosis:      Severe Aortic  Stenosis    Postoperative Diagnosis:    Same    Procedure:        Transcatheter Aortic Valve Replacement - Percutaneous Right Transfemoral Approach             Edwards Sapien 3 Ultra ResiliaTHV (size 23 mm, model # 9755RSL, serial #  [id number])              Co-Surgeons:                        Gaye Pollack, MD and Sherren Mocha, MD       Anesthesiologist:                  Raechel Ache, MD   Echocardiographer:              Osborne Oman, MD   Pre-operative Echo Findings: Severe aortic stenosis Normal left ventricular systolic function   Post-operative Echo Findings: No paravalvular leak Normal left ventricular systolic function _____________     Echo 11/09/21: IMPRESSIONS:  1. The aortic valve has been replaced with a 23 mm Sapien valve. Aortic  valve regurgitation is not visualized. Effective orifice area, by VTI  measures 1.88 cm. Aortic valve mean gradient measures 7.0 mmHg. Peak  gradient 14 mm Hg. DVI 0.64. Acceleration   time 81 ms.   2. Left ventricular ejection fraction, by estimation, is 65 to 70%. The  left ventricle has normal function. The left ventricle has no regional  wall motion abnormalities. There is mild concentric left ventricular  hypertrophy. Left ventricular diastolic  parameters are indeterminate.   3. The mitral valve is abnormal. No evidence of mitral valve  regurgitation. Moderate mitral annular calcification. Mitral valve area by  continuity equation 1.79 cm2 consisent with mild mitral stenosis. Mean  gradient 6 at a heart rate of 86 bpm.   4. Right ventricular systolic function is normal. The right ventricular  size is normal. Tricuspid regurgitation signal is inadequate for assessing  PA pressure.   Comparison(s): No significant change from prior study. Stable prosthetic  function.    _______________________    Echo 12/09/21 IMPRESSIONS  1. Left ventricular ejection fraction, by estimation, is 60 to 65%. The left ventricle has normal function. The left ventricle has no regional wall motion abnormalities. There is moderate asymmetric left ventricular hypertrophy of the basal-septal  segment. Left ventricular diastolic parameters are consistent with Grade II diastolic  dysfunction (pseudonormalization). Elevated left atrial pressure.  2. Right ventricular systolic function is normal. The right ventricular size is normal. There is normal pulmonary artery systolic pressure. The estimated right ventricular systolic pressure is 123XX123 mmHg.  3. The mitral valve is degenerative. Mild mitral valve regurgitation. Moderate mitral stenosis. Moderate mitral annular calcification. MG 67mHg at 67 bpm, MVA 1.1 cm^2 by continuity equation  4. There is a 23 mm Edwards Sapien prosthetic, stented (TAVR) valve present in the aortic position Aortic valve regurgitation is not visualized. Aortic valve mean gradient measures 7.0 mmHg, unchanged from prior echo 11/09/21. Vmax 1.8 m/s, EOA 1.5 cm^2, DI 0.6. Echo findings are consistent with normal structure and function of the aortic valve  prosthesis.  5. The inferior vena cava is normal in size with greater than 50% respiratory variability, suggesting right atrial pressure of 3 mmHg.   EKG:  EKG is not ordered today.    Recent Labs: 12/28/2021: Hemoglobin 11.4; Platelets 162 06/23/2022: ALT 16 07/24/2022: BUN 28; Creatinine, Ser 1.37; Potassium 4.7; Sodium 139  Recent Lipid Panel    Component Value Date/Time   CHOL 106 06/23/2022 1056   TRIG 116 06/23/2022 1056   HDL 46 06/23/2022 1056   CHOLHDL 2.3 06/23/2022 1056   CHOLHDL 2.5 10/10/2021 0605   VLDL 24 10/10/2021 0605   LDLCALC 39 06/23/2022 1056   Physical Exam:    VS:  BP 138/68   Pulse 67   Ht 5' (1.524 m)   Wt 151 lb 12.8 oz (68.9 kg)   SpO2 98%   BMI 29.65 kg/m     Wt Readings from Last 3 Encounters:  12/08/22 151 lb 12.8 oz (68.9 kg)  06/26/22 150 lb 3.2 oz (68.1 kg)  06/23/22 151 lb 12.8 oz (68.9 kg)    General: Well developed, well nourished, NAD Lungs:Clear to ausculation bilaterally. No wheezes, rales, or rhonchi. Breathing is unlabored. Cardiovascular: RRR with S1 S2. No murmur Extremities: No edema.  Neuro: Alert and oriented. No focal deficits. No  facial asymmetry. MAE spontaneously. Psych: Responds to questions appropriately with normal affect.    ASSESSMENT/PLAN:    Severe AS s/p TAVR: Patient doing well with NYHA class I symptoms s/p successful TAVR with a 23 mm Edwards Sapien 3 Ultra Resilia THV via the TF approach on 11/08/21. Echo today shows stable valve function with trivial PVL seen at the 3 o'clock position on PSAX with a 31m S3UR valve in place with a mean gradient 8 mmHg and normal DVI with normal other prosthetic parameters. Due to her PCI last year, she was taking DAPT with ASA and Plavix. She is now 12 months post PCI and we will stop Plavix and continue ASA indefinitely. Continue with regular follow up with Dr. CStanford Breed    CAD: Denies anginal symptoms. As above, will stop Plavix and continue ASA.   HTN: Stable with no changes needed at this time. Continue Toprol XL '25mg'$  daily and Losartan '25mg'$  daily. We will reduce her Lasix '40mg'$  QD to '20mg'$  QD with the intention to go to PRN dosing if she remains euvolemic.    Chronic diastolic heart failure: Appears euvolemic on exam. Will trail reducing her Lasix from '40mg'$  QD to '20mg'$  QD. If she tolerates, consider going to PRN dosing at follow up with Dr. CStanford Breed    Medication Adjustments/Labs and Tests Ordered: Current medicines are reviewed at length with the patient today.  Concerns regarding medicines are outlined above.  No orders of the defined types were placed in this encounter.  Meds ordered this encounter  Medications   furosemide (LASIX) 20 MG tablet    Sig: Take 1 tablet (20 mg total) by mouth daily.    Dispense:  90 tablet    Refill:  3    Patient Instructions  Medication Instructions:  Your physician has recommended you make the following change in your medication:  STOP PLAVIX  DECREASE LASIX TO 20 MG DAILY.  *If you need a refill on your cardiac medications before your next appointment, please call your pharmacy*   Lab Work: NONE If you have labs (blood  work) drawn today and your tests are completely normal, you will receive your results only by: [place](if you have MyChart) OR A paper copy in the mail If you have any lab test that is abnormal or we need to change your treatment, we will call you to review the results.   Testing/Procedures: NONE   Follow-Up: [facility] you and your health needs are our priority.  As part of our continuing mission  to provide you with exceptional heart care, we have created designated Provider Care Teams.  These Care Teams include your primary Cardiologist (physician) and Advanced Practice Providers (APPs -  Physician Assistants and Nurse Practitioners) who all work together to provide you with the care you need, when you need it.  We recommend signing up for the patient portal called "MyChart".  Sign up information is provided on this After Visit Summary.  MyChart is used to connect with patients for Virtual Visits (Telemedicine).  Patients are able to view lab/test results, encounter notes, upcoming appointments, etc.  Non-urgent messages can be sent to your provider as well.   To learn more about what you can do with MyChart, go to [email or web address].    Your next appointment:   KEEP SCHEDULED FOLLOW-UP   Signed, Kathyrn Drown, NP  12/08/2022 1:17 PM    [facility]

## 2022-12-08 ENCOUNTER — Ambulatory Visit: Payer: Medicare HMO | Attending: Cardiology | Admitting: Cardiology

## 2022-12-08 ENCOUNTER — Ambulatory Visit (HOSPITAL_COMMUNITY): Payer: Medicare HMO | Attending: Internal Medicine

## 2022-12-08 VITALS — BP 138/68 | HR 67 | Ht 60.0 in | Wt 151.8 lb

## 2022-12-08 DIAGNOSIS — R911 Solitary pulmonary nodule: Secondary | ICD-10-CM | POA: Insufficient documentation

## 2022-12-08 DIAGNOSIS — Z853 Personal history of malignant neoplasm of breast: Secondary | ICD-10-CM | POA: Diagnosis present

## 2022-12-08 DIAGNOSIS — I350 Nonrheumatic aortic (valve) stenosis: Secondary | ICD-10-CM | POA: Diagnosis not present

## 2022-12-08 DIAGNOSIS — I2510 Atherosclerotic heart disease of native coronary artery without angina pectoris: Secondary | ICD-10-CM

## 2022-12-08 DIAGNOSIS — I10 Essential (primary) hypertension: Secondary | ICD-10-CM

## 2022-12-08 DIAGNOSIS — N281 Cyst of kidney, acquired: Secondary | ICD-10-CM | POA: Insufficient documentation

## 2022-12-08 DIAGNOSIS — I5032 Chronic diastolic (congestive) heart failure: Secondary | ICD-10-CM

## 2022-12-08 DIAGNOSIS — Z952 Presence of prosthetic heart valve: Secondary | ICD-10-CM | POA: Insufficient documentation

## 2022-12-08 DIAGNOSIS — Z9861 Coronary angioplasty status: Secondary | ICD-10-CM | POA: Insufficient documentation

## 2022-12-08 DIAGNOSIS — E108 Type 1 diabetes mellitus with unspecified complications: Secondary | ICD-10-CM | POA: Diagnosis not present

## 2022-12-08 LAB — ECHOCARDIOGRAM COMPLETE
AV Mean grad: 8 mmHg
AV Peak grad: 13 mmHg
Ao pk vel: 1.8 m/s
Area-P 1/2: 2.78 cm2
S' Lateral: 1.9 cm

## 2022-12-08 MED ORDER — FUROSEMIDE 20 MG PO TABS: 20.0000 mg | ORAL_TABLET | Freq: Every day | ORAL | 3 refills | Status: DC

## 2022-12-08 NOTE — Patient Instructions (Signed)
Medication Instructions:  Your physician has recommended you make the following change in your medication:  STOP PLAVIX  DECREASE LASIX TO 20 MG DAILY.  *If you need a refill on your cardiac medications before your next appointment, please call your pharmacy*   Lab Work: NONE If you have labs (blood work) drawn today and your tests are completely normal, you will receive your results only by: [place] (if you have MyChart) OR A paper copy in the mail If you have any lab test that is abnormal or we need to change your treatment, we will call you to review the results.   Testing/Procedures: NONE   Follow-Up: At [facility], you and your health needs are our priority.  As part of our continuing mission to provide you with exceptional heart care, we have created designated Provider Care Teams.  These Care Teams include your primary Cardiologist (physician) and Advanced Practice Providers (APPs -  Physician Assistants and Nurse Practitioners) who all work together to provide you with the care you need, when you need it.  We recommend signing up for the patient portal called "MyChart".  Sign up information is provided on this After Visit Summary.  MyChart is used to connect with patients for Virtual Visits (Telemedicine).  Patients are able to view lab/test results, encounter notes, upcoming appointments, etc.  Non-urgent messages can be sent to your provider as well.   To learn more about what you can do with MyChart, go to [email or web address].    Your next appointment:   KEEP SCHEDULED FOLLOW-UP

## 2022-12-18 ENCOUNTER — Encounter: Payer: Self-pay | Admitting: Cardiology

## 2022-12-18 ENCOUNTER — Ambulatory Visit: Payer: Medicare HMO | Attending: Cardiology | Admitting: Cardiology

## 2022-12-18 VITALS — BP 124/68 | HR 75 | Ht 60.0 in | Wt 151.8 lb

## 2022-12-18 DIAGNOSIS — Z952 Presence of prosthetic heart valve: Secondary | ICD-10-CM

## 2022-12-18 DIAGNOSIS — I2510 Atherosclerotic heart disease of native coronary artery without angina pectoris: Secondary | ICD-10-CM

## 2022-12-18 DIAGNOSIS — I10 Essential (primary) hypertension: Secondary | ICD-10-CM

## 2022-12-18 DIAGNOSIS — E7800 Pure hypercholesterolemia, unspecified: Secondary | ICD-10-CM

## 2022-12-18 DIAGNOSIS — I5032 Chronic diastolic (congestive) heart failure: Secondary | ICD-10-CM

## 2022-12-18 DIAGNOSIS — Z9861 Coronary angioplasty status: Secondary | ICD-10-CM

## 2022-12-18 LAB — COMPREHENSIVE METABOLIC PANEL
ALT: 18 IU/L (ref 0–32)
AST: 28 IU/L (ref 0–40)
Albumin/Globulin Ratio: 1.7 (ref 1.2–2.2)
Albumin: 4.4 g/dL (ref 3.8–4.8)
Alkaline Phosphatase: 105 IU/L (ref 44–121)
BUN/Creatinine Ratio: 19 (ref 12–28)
BUN: 27 mg/dL (ref 8–27)
Bilirubin Total: 0.4 mg/dL (ref 0.0–1.2)
CO2: 27 mmol/L (ref 20–29)
Calcium: 10 mg/dL (ref 8.7–10.3)
Chloride: 102 mmol/L (ref 96–106)
Creatinine, Ser: 1.43 mg/dL — ABNORMAL HIGH (ref 0.57–1.00)
Globulin, Total: 2.6 g/dL (ref 1.5–4.5)
Glucose: 93 mg/dL (ref 70–99)
Potassium: 4.8 mmol/L (ref 3.5–5.2)
Sodium: 140 mmol/L (ref 134–144)
Total Protein: 7 g/dL (ref 6.0–8.5)
eGFR: 39 mL/min/{1.73_m2} — ABNORMAL LOW (ref >59)

## 2022-12-18 LAB — LIPID PANEL
Chol/HDL Ratio: 2 ratio (ref 0.0–4.4)
Cholesterol, Total: 111 mg/dL (ref 100–199)
HDL: 56 mg/dL (ref >39)
LDL Chol Calc (NIH): 39 mg/dL (ref 0–99)
Triglycerides: 77 mg/dL (ref 0–149)
VLDL Cholesterol Cal: 16 mg/dL (ref 5–40)

## 2022-12-18 NOTE — Patient Instructions (Signed)
    Follow-Up: At  [facility]Care, you and your health needs are our priority.  As part of our continuing mission to provide you with exceptional heart care, we have created designated Provider Care Teams.  These Care Teams include your primary Cardiologist (physician) and Advanced Practice Providers (APPs -  Physician Assistants and Nurse Practitioners) who all work together to provide you with the care you need, when you need it.  We recommend signing up for the patient portal called "MyChart".  Sign up information is provided on this After Visit Summary.  MyChart is used to connect with patients for Virtual Visits (Telemedicine).  Patients are able to view lab/test results, encounter notes, upcoming appointments, etc.  Non-urgent messages can be sent to your provider as well.   To learn more about what you can do with MyChart, go to [email or web address].    Your next appointment:   12 month(s)  Provider:   Brian Crenshaw, MD     

## 2022-12-22 ENCOUNTER — Encounter: Payer: Self-pay | Admitting: *Deleted

## 2022-12-26 ENCOUNTER — Ambulatory Visit: Payer: Medicare HMO | Admitting: Internal Medicine

## 2022-12-26 ENCOUNTER — Encounter: Payer: Self-pay | Admitting: Internal Medicine

## 2022-12-26 VITALS — BP 120/70 | HR 68 | Ht 60.0 in | Wt 152.4 lb

## 2022-12-26 DIAGNOSIS — E1022 Type 1 diabetes mellitus with diabetic chronic kidney disease: Secondary | ICD-10-CM | POA: Diagnosis not present

## 2022-12-26 DIAGNOSIS — E1059 Type 1 diabetes mellitus with other circulatory complications: Secondary | ICD-10-CM | POA: Diagnosis not present

## 2022-12-26 DIAGNOSIS — N1831 Chronic kidney disease, stage 3a: Secondary | ICD-10-CM | POA: Diagnosis not present

## 2022-12-26 LAB — POCT GLYCOSYLATED HEMOGLOBIN (HGB A1C): Hemoglobin A1C: 6.7 % — AB (ref 4.0–5.6)

## 2022-12-26 MED ORDER — INSULIN PEN NEEDLE 32G X 4 MM MISC: 1.0000 | Freq: Four times a day (QID) | 3 refills | Status: DC

## 2022-12-26 MED ORDER — NOVOLOG FLEXPEN 100 UNIT/ML ~~LOC~~ SOPN: PEN_INJECTOR | SUBCUTANEOUS | 3 refills | Status: DC

## 2022-12-26 MED ORDER — TRESIBA FLEXTOUCH 100 UNIT/ML ~~LOC~~ SOPN: 32.0000 [IU] | PEN_INJECTOR | Freq: Every day | SUBCUTANEOUS | 4 refills | Status: DC

## 2022-12-26 NOTE — Patient Instructions (Signed)
-   Continue Semglee 32 units once daily  - Change Novolog 4 units with Breakfast, 4 units with Lunch and 6 units with Supper PLUS additional if needed based on the scale below  -Novolog correctional insulin: ADD extra units on insulin to your meal-time Novolog dose if your blood sugars are higher than 160. Use the scale below to help guide you:   Blood sugar before meal Number of units to inject  Less than 160 0 unit  161 -  190 1 units  191 -  220 2 units  221 -  250 3 units  251 -  280 4 units  281 -  310 5 units  311 -  340 6 units  341 -  370 7 units  371 -  400 8 units  401 - 430 9 units   431 - 460 10 units      HOW TO TREAT LOW BLOOD SUGARS (Blood sugar LESS THAN 70 MG/DL) Please follow the RULE OF 15 for the treatment of hypoglycemia treatment (when your (blood sugars are less than 70 mg/dL)   STEP 1: Take 15 grams of carbohydrates when your blood sugar is low, which includes:  3-4 GLUCOSE TABS  OR 3-4 OZ OF JUICE OR REGULAR SODA OR ONE TUBE OF GLUCOSE GEL    STEP 2: RECHECK blood sugar in 15 MINUTES STEP 3: If your blood sugar is still low at the 15 minute recheck --> then, go back to STEP 1 and treat AGAIN with another 15 grams of carbohydrates.

## 2022-12-26 NOTE — Progress Notes (Signed)
Name: Molly Hill  MRN/ DOB: [id number], [birth year]   Age/ Sex: 72 y.o., female    PCP: No primary care provider on file.   Reason for Endocrinology Evaluation: Type 2 Diabetes Mellitus     Date of Initial Endocrinology Visit: 02/15/2022    PATIENT IDENTIFIER: Molly Hill is a 72 y.o. female with a past medical history of T2DM, S/P TAVR, CAD, Hx of Breast ca (Dx 2016) . The patient presented for initial endocrinology clinic visit on 02/15/2022 for consultative assistance with her diabetes management.    HPI: Molly Hill was   Diagnosed with DM at age 83  Hemoglobin A1c 7.8%     On her initial visit to uor clinic, her A1c was 8.0 %, she was on regular insulin and NPH. We switched her to insulin analogues and adjusted the doses , dexcom was faxed to DME  SUBJECTIVE:   During the last visit (06/26/2022): A1c 6.9%    Today (12/26/22): Molly Hill is here for a follow up on diabetes management. She  checks her blood sugars multiple times daily. The patient has had hypoglycemic episodes since the last clinic visit. The patient is  symptomatic with these episodes.  She continues to follow-up with cardiology s/p TAVR and CAD/CHF 12/18/2022  She was seen by Ortho for trigger finger, s/p tendon sheath/ligament injection 07/05/2022  Denies nausea, vomiting or diarrhea  She had vertigo over the weekend, this is an intermittent issue for the past year with nausea. NO urinary symptoms    HOME DIABETES REGIMEN: Semglee 35 units daily - self decreased to 32 units Novolog 6 units TIDQAC CF: NOvolog (BG -130/30)     Statin: yes ACE-I/ARB: yes    CONTINUOUS GLUCOSE MONITORING RECORD INTERPRETATION    Dates of Recording: 2/28-3/09/2023  Sensor description:dexcom G7  Results statistics:   CGM use % of time 93  Average and SD 158/57  Time in range   66     %  % Time Above 180 23  % Time above 250 7  % Time Below target 3   Glycemic patterns summary: BG's trend down to   optimal at night and fluctuate during the day   Hyperglycemic episodes  after supper   Hypoglycemic episodes occurred post bolus   Overnight periods: variable without a pattern     DIABETIC COMPLICATIONS: Microvascular complications:  CKD III  Denies: Retinopathy, neuropathy  Last eye exam: Completed 01/2022 ( Dr. Katy Fitch)  Macrovascular complications:  CAD Denies:  PVD, CVA   PAST HISTORY: Past Medical History:  Past Medical History:  Diagnosis Date   Alopecia    Anemia    Arthritis    Bicuspid aortic valve    mild to moderate AS by echo 2022   Chronic diastolic (congestive) heart failure ([place])    Coronary arteriosclerosis in native artery    s/p PCI of LAD in 2013 & NSTEMI with staged DES to pLAD (10/2021)   DM (diabetes mellitus), type 1 (HCC)    GERD (gastroesophageal reflux disease)    History of breast cancer    Estrogen receptor negative. s/p chemo   Hyperglycemia    Hyperlipidemia    Hypertension    Mitral regurgitation    mild to moderate by echo 2020   Myocardial infarction [facility]) 10/09/2021   S/P TAVR (transcatheter aortic valve replacement) 11/08/2021   s/p TAVR with a 23 mm Edwards S3UR via the TF approach by Dr. Burt Knack and Dr. Cyndia Bent   Tendinitis of left  wrist    Past Surgical History:  Past Surgical History:  Procedure Laterality Date   ANKLE SURGERY Bilateral    CATARACT EXTRACTION, BILATERAL     CHOLECYSTECTOMY     CORONARY STENT INTERVENTION     CORONARY STENT INTERVENTION N/A 10/21/2021   Procedure: CORONARY STENT INTERVENTION;  Surgeon: Sherren Mocha, MD;  Location: [place] [facility];  Service: Cardiovascular;  Laterality: N/A;   CORONARY/GRAFT ACUTE MI REVASCULARIZATION N/A 10/10/2021   Procedure: CORONARY/GRAFT ACUTE MI REVASCULARIZATION;  Surgeon: Lorretta Harp, MD;  Location: [place] [facility];  Service: Cardiovascular;  Laterality: N/A;   CTR     ELBOW SURGERY     HAND SURGERY     BOTH THUMBS AND MIDDLE FINGERS   INTRAOPERATIVE  TRANSTHORACIC ECHOCARDIOGRAM N/A 11/08/2021   Procedure: INTRAOPERATIVE TRANSTHORACIC ECHOCARDIOGRAM;  Surgeon: Sherren Mocha, MD;  Location: [place] [facility];  Service: Open Heart Surgery;  Laterality: N/A;   INTRAVASCULAR PRESSURE WIRE/FFR STUDY N/A 10/21/2021   Procedure: INTRAVASCULAR PRESSURE WIRE/FFR STUDY;  Surgeon: Sherren Mocha, MD;  Location: [place] [facility];  Service: Cardiovascular;  Laterality: N/A;   LEFT BREAST MASTECTOMY     MASCULAR  REPAIR     PORT-A-CATH REMOVAL     PORTACATH PLACEMENT     RIGHT MODIFIED MASTECTOMY Right    TRANSCATHETER AORTIC VALVE REPLACEMENT, TRANSFEMORAL N/A 11/08/2021   Procedure: TRANSCATHETER AORTIC VALVE REPLACEMENT, TRANSFEMORAL;  Surgeon: Sherren Mocha, MD;  Location: [place] [facility];  Service: Open Heart Surgery;  Laterality: N/A;   UPPER LIP SURGERY      Social History:  reports that she quit smoking about 43 years ago. Her smoking use included cigarettes. She has never used smokeless tobacco. She reports that she does not drink alcohol and does not use drugs. Family History:  Family History  Problem Relation Age of Onset   Heart attack Mother    CAD Father        CABG   Diabetes Father    Stroke Father    Colon cancer Father        66s   Diabetes Brother    Diabetes Paternal Grandfather    Heart attack Paternal Grandfather    Breast cancer Maternal Aunt        51s     HOME MEDICATIONS: Allergies as of 12/26/2022       Reactions   Codeine    Hyper with vomiting   Onion Diarrhea, Nausea And Vomiting, Other (See Comments)   syncope   Reglan [metoclopramide]    "pulls muscle to side" "feel paralyzed"        Medication List        Accurate as of December 26, 2022 10:51 AM. If you have any questions, ask your nurse or doctor.          acetaminophen 500 MG tablet Commonly known as: TYLENOL Take 1,000 mg by mouth every 6 (six) hours as needed for moderate pain or headache.   amLODipine 2.5 MG tablet Commonly  known as: NORVASC TAKE 1 TABLET BY MOUTH EVERY DAY   amoxicillin 500 MG capsule Commonly known as: AMOXIL Take 4 capsules by mouth 1 hour prior to dental appointments   ascorbic acid 500 MG tablet Commonly known as: VITAMIN C Take 500 mg by mouth daily.   aspirin EC 81 MG tablet Take 81 mg by mouth at bedtime.   atorvastatin 80 MG tablet Commonly known as: LIPITOR TAKE 1 TABLET BY MOUTH EVERYDAY AT BEDTIME  BIOTIN PO Take by mouth daily.   bisacodyl 5 MG EC tablet Generic drug: bisacodyl Take 2 tablets (10 mg total) by mouth daily as needed for moderate constipation.   CALCIUM 600 + D PO Take 1 tablet by mouth daily.   cholecalciferol 25 MCG (1000 UT) tablet Generic drug: Cholecalciferol Take 1,000 Units by mouth daily.   cyanocobalamin 1000 MCG tablet Commonly known as: VITAMIN B12 Take 1,000 mcg by mouth daily.   Dexcom G7 Sensor Misc 1 Device by Does not apply route every 30 (thirty) days.   docusate sodium 100 MG capsule Commonly known as: COLACE Take 2 capsules (200 mg total) by mouth daily.   EYE ALLERGY RELIEF OP Place 1 drop into both eyes daily as needed (allergies).   fexofenadine 180 MG tablet Commonly known as: ALLEGRA Take 180 mg by mouth daily as needed for allergies.   fluticasone 50 MCG/ACT nasal spray Commonly known as: FLONASE Place 1 spray into both nostrils as needed for allergies.   glucose blood test strip 1 each by Other route 4 (four) times daily -  before meals and at bedtime. Use as instructed   HAIR/SKIN/NAILS PO Take 1 capsule by mouth daily.   Insulin Pen Needle 32G X 4 MM Misc 1 Device by Does not apply route in the morning, at noon, in the evening, and at bedtime.   loperamide 2 MG tablet Commonly known as: IMODIUM A-D Take 1-2 mg by mouth 4 (four) times daily as needed for diarrhea or loose stools.   losartan 25 MG tablet Commonly known as: COZAAR Take 1 tablet (25 mg total) by mouth daily.   metoprolol succinate  25 MG 24 hr tablet Commonly known as: Toprol XL Take 1 tablet (25 mg total) by mouth daily.   nitroGLYCERIN 0.4 MG SL tablet Commonly known as: NITROSTAT Place 1 tablet (0.4 mg total) under the tongue every 5 (five) minutes as needed for chest pain.   NovoLOG FlexPen 100 UNIT/ML FlexPen Generic drug: insulin aspart Max daily 45 units per scale   Tresiba FlexTouch 100 UNIT/ML FlexTouch Pen Generic drug: insulin degludec Inject 35 Units into the skin daily. What changed: how much to take   Zinc 50 MG Caps Take 50 mg by mouth daily.         ALLERGIES: Allergies  Allergen Reactions   Codeine     Hyper with vomiting   Onion Diarrhea, Nausea And Vomiting and Other (See Comments)    syncope   Reglan [Metoclopramide]     "pulls muscle to side" "feel paralyzed"      OBJECTIVE:   VITAL SIGNS: BP 120/70 (BP Location: Left Arm, Patient Position: Sitting, Cuff Size: Large)   Pulse 68   Ht 5' (1.524 m)   Wt 152 lb 6.4 oz (69.1 kg)   SpO2 96%   BMI 29.76 kg/m    PHYSICAL EXAM:  General: Pt appears well and is in NAD  Neck: General: Supple without adenopathy or carotid bruits. Thyroid: Thyroid size normal.  No goiter or nodules appreciated.   Lungs: Clear with good BS bilat with no rales, rhonchi, or wheezes  Heart: RRR   Extremities:  Lower extremities - No pretibial edema.   Neuro: MS is good with appropriate affect, pt is alert and Ox3    DM foot exam: 12/26/2022  The skin of the feet is intact without sores or ulcerations. The pedal pulses are 2+ on right and 2+ on left. The sensation is intact to a screening  5.07, 10 gram monofilament bilaterally   DATA REVIEWED:  Lab Results  Component Value Date   HGBA1C 6.7 (A) 12/26/2022   HGBA1C 6.9 (A) 06/26/2022   HGBA1C 8.0 (A) 02/15/2022     Latest Reference Range & Units 12/18/22 09:23  Sodium 134 - 144 mmol/L 140  Potassium 3.5 - 5.2 mmol/L 4.8  Chloride 96 - 106 mmol/L 102  CO2 20 - 29 mmol/L 27  Glucose  70 - 99 mg/dL 93  BUN 8 - 27 mg/dL 27  Creatinine 0.57 - 1.00 mg/dL 1.43 (H)  Calcium 8.7 - 10.3 mg/dL 10.0  BUN/Creatinine Ratio 12 - 28  19  eGFR >59 mL/min/1.73 39 (L)  Alkaline Phosphatase 44 - 121 IU/L 105  Albumin 3.8 - 4.8 g/dL 4.4  Albumin/Globulin Ratio 1.2 - 2.2  1.7  AST 0 - 40 IU/L 28  ALT 0 - 32 IU/L 18  Total Protein 6.0 - 8.5 g/dL 7.0  Total Bilirubin 0.0 - 1.2 mg/dL 0.4  Total CHOL/HDL Ratio 0.0 - 4.4 ratio 2.0  Cholesterol, Total 100 - 199 mg/dL 111  HDL Cholesterol >39 mg/dL 56  Triglycerides 0 - 149 mg/dL 77  VLDL Cholesterol Cal 5 - 40 mg/dL 16  LDL Chol Calc (NIH) 0 - 99 mg/dL 39    ASSESSMENT / PLAN / RECOMMENDATIONS:   1) Type 1 Diabetes Mellitus, Optimally controlled, With CKD III and Macrovascular  complications - Most recent A1c of 6.7 %. Goal A1c < 7.0 %.    - A1c continues to improve  - She has self decreased basal insulin from 35 u to 32 u, I am unable to find a pattern on CGM download but mostly trend up and stay up at night, but will not change this for now until a pattern is determined  - She has been noted with hypoglycemia after breakfast and lunch, will reduce prandial dose as below for those meals   MEDICATIONS:  Continue  Semglee 32 units daily Decrease NovoLog 4 units before Breakfast, 4 units before Lunch and 6 units before supper  Correction factor: NovoLog (BG -130/30)   EDUCATION / INSTRUCTIONS: BG monitoring instructions: Patient is instructed to check her blood sugars 3 times a day, before meals. Call [facility] if: BG persistently < 70  I reviewed the Rule of 15 for the treatment of hypoglycemia in detail with the patient. Literature supplied.   2) Diabetic complications:  Eye: Does not have known diabetic retinopathy.  Neuro/ Feet: Does not have known diabetic peripheral neuropathy. Renal: Patient does  have known baseline CKD. She is  on an ACEI/ARB at present.   Follow-up in 6 months     Signed  electronically by: Mack Guise, MD  [facility] End[facility]  [facility] [address] [place], [place] [address] Phone: [phone] FAX: [phone]   CC: No primary care provider on file. No primary provider on file. Phone: None  Fax: None    Return to Endocrinology clinic as below: Future Appointments  Date Time Provider [place]  12/29/2022 10:00 AM [facility] None  12/29/2022 10:30 AM Cammie Sickle, MD [facility] None

## 2022-12-29 ENCOUNTER — Inpatient Hospital Stay: Payer: Medicare HMO | Attending: Internal Medicine

## 2022-12-29 ENCOUNTER — Encounter: Payer: Self-pay | Admitting: Internal Medicine

## 2022-12-29 ENCOUNTER — Inpatient Hospital Stay (HOSPITAL_BASED_OUTPATIENT_CLINIC_OR_DEPARTMENT_OTHER): Payer: Medicare HMO | Admitting: Internal Medicine

## 2022-12-29 VITALS — BP 143/54 | HR 65 | Temp 98.8°F | Resp 17 | Wt 153.2 lb

## 2022-12-29 DIAGNOSIS — Z853 Personal history of malignant neoplasm of breast: Secondary | ICD-10-CM | POA: Insufficient documentation

## 2022-12-29 DIAGNOSIS — D696 Thrombocytopenia, unspecified: Secondary | ICD-10-CM | POA: Insufficient documentation

## 2022-12-29 DIAGNOSIS — Z171 Estrogen receptor negative status [ER-]: Secondary | ICD-10-CM

## 2022-12-29 DIAGNOSIS — C50811 Malignant neoplasm of overlapping sites of right female breast: Secondary | ICD-10-CM | POA: Diagnosis not present

## 2022-12-29 DIAGNOSIS — N183 Chronic kidney disease, stage 3 unspecified: Secondary | ICD-10-CM | POA: Diagnosis not present

## 2022-12-29 DIAGNOSIS — I5032 Chronic diastolic (congestive) heart failure: Secondary | ICD-10-CM | POA: Insufficient documentation

## 2022-12-29 DIAGNOSIS — I2510 Atherosclerotic heart disease of native coronary artery without angina pectoris: Secondary | ICD-10-CM | POA: Diagnosis not present

## 2022-12-29 DIAGNOSIS — D649 Anemia, unspecified: Secondary | ICD-10-CM | POA: Diagnosis not present

## 2022-12-29 DIAGNOSIS — Z9013 Acquired absence of bilateral breasts and nipples: Secondary | ICD-10-CM | POA: Diagnosis not present

## 2022-12-29 DIAGNOSIS — I130 Hypertensive heart and chronic kidney disease with heart failure and stage 1 through stage 4 chronic kidney disease, or unspecified chronic kidney disease: Secondary | ICD-10-CM | POA: Diagnosis not present

## 2022-12-29 LAB — CBC WITH DIFFERENTIAL/PLATELET
Abs Immature Granulocytes: 0.02 10*3/uL (ref 0.00–0.07)
Basophils Absolute: 0 10*3/uL (ref 0.0–0.1)
Basophils Relative: 0 %
Eosinophils Absolute: 0.1 10*3/uL (ref 0.0–0.5)
Eosinophils Relative: 2 %
HCT: 34.7 % — ABNORMAL LOW (ref 36.0–46.0)
Hemoglobin: 11.1 g/dL — ABNORMAL LOW (ref 12.0–15.0)
Immature Granulocytes: 0 %
Lymphocytes Relative: 16 %
Lymphs Abs: 1 10*3/uL (ref 0.7–4.0)
MCH: 30.6 pg (ref 26.0–34.0)
MCHC: 32 g/dL (ref 30.0–36.0)
MCV: 95.6 fL (ref 80.0–100.0)
Monocytes Absolute: 0.9 10*3/uL (ref 0.1–1.0)
Monocytes Relative: 13 %
Neutro Abs: 4.5 10*3/uL (ref 1.7–7.7)
Neutrophils Relative %: 69 %
Platelets: 123 10*3/uL — ABNORMAL LOW (ref 150–400)
RBC: 3.63 MIL/uL — ABNORMAL LOW (ref 3.87–5.11)
RDW: 14.1 % (ref 11.5–15.5)
WBC: 6.5 10*3/uL (ref 4.0–10.5)
nRBC: 0 % (ref 0.0–0.2)

## 2022-12-29 LAB — COMPREHENSIVE METABOLIC PANEL
ALT: 18 U/L (ref 0–44)
AST: 29 U/L (ref 15–41)
Albumin: 3.9 g/dL (ref 3.5–5.0)
Alkaline Phosphatase: 90 U/L (ref 38–126)
Anion gap: 7 (ref 5–15)
BUN: 25 mg/dL — ABNORMAL HIGH (ref 8–23)
CO2: 27 mmol/L (ref 22–32)
Calcium: 8.9 mg/dL (ref 8.9–10.3)
Chloride: 104 mmol/L (ref 98–111)
Creatinine, Ser: 1.17 mg/dL — ABNORMAL HIGH (ref 0.44–1.00)
GFR, Estimated: 50 mL/min — ABNORMAL LOW (ref >60)
Glucose, Bld: 90 mg/dL (ref 70–99)
Potassium: 4.5 mmol/L (ref 3.5–5.1)
Sodium: 138 mmol/L (ref 135–145)
Total Bilirubin: 0.7 mg/dL (ref 0.3–1.2)
Total Protein: 7.3 g/dL (ref 6.5–8.1)

## 2022-12-29 NOTE — Progress Notes (Signed)
[place] NOTE  Patient Care Team: Charlane Ferretti, MD as PCP - General (Internal Medicine) Sherren Mocha, MD as PCP - Structural Heart (Cardiology) Stanford Breed Denice Bors, MD as PCP - Cardiology (Cardiology) Cammie Sickle, MD as Consulting Physician (Hematology and Oncology)  CHIEF COMPLAINTS/PURPOSE OF CONSULTATION: breast cancer  #  Oncology History Overview Note  #September 2016-multifocal right breast cancer triple negative [ER/PR HER-2 negative; [facility]]-PET-no metastatic disease; dose dense Adriamycin followed by weekly Taxol.  November 05, 2013-right mastectomy- [id number]-complete pathologic response.  status post adjuvant right ax.radiation Left breast cancer- as pt/reocds s/p mastectomy-no malignancy noted.    # DM- on insulin [since [id number]  # SURVIVORSHIP:   # GENETICS: NEG as per pt.   DIAGNOSIS: Right breast cancer  STAGE: early stage        ;  GOALS: cure  CURRENT/MOST RECENT THERAPY : Surveillance      Carcinoma of overlapping sites of right breast in female, estrogen receptor negative ([place])  12/24/2019 Initial Diagnosis   Carcinoma of overlapping sites of right breast in female, estrogen receptor negative ([place])      HISTORY OF PRESENTING ILLNESS: Ambulating independently.  Alone.  Molly Hill 72 y.o.  female above history of bilateral triple negative breast cancer is here for follow-u.   Denies any new lumps or bumps.  Appetite is good.  No headaches.  No abdominal pain.  No constipation.   Review of Systems  Constitutional:  Positive for malaise/fatigue. Negative for chills, diaphoresis, fever and weight loss.  HENT:  Negative for nosebleeds and sore throat.   Eyes:  Negative for double vision.  Respiratory:  Negative for cough, hemoptysis, sputum production, shortness of breath and wheezing.   Cardiovascular:  Negative for chest pain, palpitations, orthopnea and leg swelling.  Gastrointestinal:   Negative for abdominal pain, blood in stool, constipation, diarrhea, heartburn, melena, nausea and vomiting.  Genitourinary:  Negative for dysuria, frequency and urgency.  Musculoskeletal:  Positive for joint pain. Negative for back pain.  Skin: Negative.  Negative for itching and [place].  Neurological:  Negative for dizziness, tingling, focal weakness, weakness and headaches.  Endo/Heme/Allergies:  Does not bruise/bleed easily.  Psychiatric/Behavioral:  Negative for depression. The patient is not nervous/anxious and does not have insomnia.      MEDICAL HISTORY:  Past Medical History:  Diagnosis Date   Alopecia    Anemia    Arthritis    Bicuspid aortic valve    mild to moderate AS by echo 2022   Chronic diastolic (congestive) heart failure (HCC)    Coronary arteriosclerosis in native artery    s/p PCI of LAD in 2013 & NSTEMI with staged DES to pLAD (10/2021)   DM (diabetes mellitus), type 1 (HCC)    GERD (gastroesophageal reflux disease)    History of breast cancer    Estrogen receptor negative. s/p chemo   Hyperglycemia    Hyperlipidemia    Hypertension    Mitral regurgitation    mild to moderate by echo 2020   Myocardial infarction [facility]) 10/09/2021   S/P TAVR (transcatheter aortic valve replacement) 11/08/2021   s/p TAVR with a 23 mm Edwards S3UR via the TF approach by Dr. Burt Knack and Dr. Cyndia Bent   Tendinitis of left wrist     SURGICAL HISTORY: Past Surgical History:  Procedure Laterality Date   ANKLE SURGERY Bilateral    CATARACT EXTRACTION, BILATERAL     CHOLECYSTECTOMY     CORONARY STENT INTERVENTION  CORONARY STENT INTERVENTION N/A 10/21/2021   Procedure: CORONARY STENT INTERVENTION;  Surgeon: Sherren Mocha, MD;  Location: [place] [facility];  Service: Cardiovascular;  Laterality: N/A;   CORONARY/GRAFT ACUTE MI REVASCULARIZATION N/A 10/10/2021   Procedure: CORONARY/GRAFT ACUTE MI REVASCULARIZATION;  Surgeon: Lorretta Harp, MD;  Location: [place] [facility];   Service: Cardiovascular;  Laterality: N/A;   CTR     ELBOW SURGERY     HAND SURGERY     BOTH THUMBS AND MIDDLE FINGERS   INTRAOPERATIVE TRANSTHORACIC ECHOCARDIOGRAM N/A 11/08/2021   Procedure: INTRAOPERATIVE TRANSTHORACIC ECHOCARDIOGRAM;  Surgeon: Sherren Mocha, MD;  Location: [place] [facility];  Service: Open Heart Surgery;  Laterality: N/A;   INTRAVASCULAR PRESSURE WIRE/FFR STUDY N/A 10/21/2021   Procedure: INTRAVASCULAR PRESSURE WIRE/FFR STUDY;  Surgeon: Sherren Mocha, MD;  Location: [place] [facility];  Service: Cardiovascular;  Laterality: N/A;   LEFT BREAST MASTECTOMY     MASCULAR  REPAIR     PORT-A-CATH REMOVAL     PORTACATH PLACEMENT     RIGHT MODIFIED MASTECTOMY Right    TRANSCATHETER AORTIC VALVE REPLACEMENT, TRANSFEMORAL N/A 11/08/2021   Procedure: TRANSCATHETER AORTIC VALVE REPLACEMENT, TRANSFEMORAL;  Surgeon: Sherren Mocha, MD;  Location: [place] [facility];  Service: Open Heart Surgery;  Laterality: N/A;   UPPER LIP SURGERY      SOCIAL HISTORY: Social History   Socioeconomic History   Marital status: Widowed    Spouse name: Not on file   Number of children: Not on file   Years of education: Not on file   Highest education level: Not on file  Occupational History   Not on file  Tobacco Use   Smoking status: Former    Years: 3    Types: Cigarettes    Quit date: 08/15/1979    Years since quitting: 43.4   Smokeless tobacco: Never   Tobacco comments:    1 pack would last a week.  Vaping Use   Vaping Use: Never used  Substance and Sexual Activity   Alcohol use: Never   Drug use: Never   Sexual activity: Never  Other Topics Concern   Not on file  Social History Narrative   Not on file   Social Determinants of Health   Financial Resource Strain: Not on file  Food Insecurity: Not on file  Transportation Needs: Not on file  Physical Activity: Not on file  Stress: Not on file  Social Connections: Not on file  Intimate Partner Violence: Not on file     FAMILY HISTORY: Family History  Problem Relation Age of Onset   Heart attack Mother    CAD Father        CABG   Diabetes Father    Stroke Father    Colon cancer Father        64s   Diabetes Brother    Diabetes Paternal Grandfather    Heart attack Paternal Grandfather    Breast cancer Maternal Aunt        39s    ALLERGIES:  is allergic to codeine, onion, and reglan [metoclopramide].  MEDICATIONS:  Current Outpatient Medications  Medication Sig Dispense Refill   acetaminophen (TYLENOL) 500 MG tablet Take 1,000 mg by mouth every 6 (six) hours as needed for moderate pain or headache.     amLODipine (NORVASC) 2.5 MG tablet TAKE 1 TABLET BY MOUTH EVERY DAY 90 tablet 3   aspirin EC 81 MG tablet Take 81 mg by mouth at bedtime.     atorvastatin (  LIPITOR) 80 MG tablet TAKE 1 TABLET BY MOUTH EVERYDAY AT BEDTIME 90 tablet 3   BIOTIN PO Take by mouth daily.     Biotin w/ Vitamins C & E (HAIR/SKIN/NAILS PO) Take 1 capsule by mouth daily.     bisacodyl (DULCOLAX) 5 MG EC tablet Take 2 tablets (10 mg total) by mouth daily as needed for moderate constipation. 30 tablet 0   Calcium Carb-Cholecalciferol (CALCIUM 600 + D PO) Take 1 tablet by mouth daily.     cholecalciferol (VITAMIN D) 25 MCG (1000 UNIT) tablet Take 1,000 Units by mouth daily.     Continuous Blood Gluc Sensor (DEXCOM G7 SENSOR) MISC 1 Device by Does not apply route every 30 (thirty) days.     docusate sodium (COLACE) 100 MG capsule Take 2 capsules (200 mg total) by mouth daily. 10 capsule 0   fexofenadine (ALLEGRA) 180 MG tablet Take 180 mg by mouth daily as needed for allergies.     fluticasone (FLONASE) 50 MCG/ACT nasal spray Place 1 spray into both nostrils as needed for allergies.     glucose blood test strip 1 each by Other route 4 (four) times daily -  before meals and at bedtime. Use as instructed     insulin aspart (NOVOLOG FLEXPEN) 100 UNIT/ML FlexPen Max daily 45 units per scale 45 mL 3   insulin degludec (TRESIBA  FLEXTOUCH) 100 UNIT/ML FlexTouch Pen Inject 32 Units into the skin daily. 30 mL 4   Insulin Pen Needle 32G X 4 MM MISC 1 Device by Does not apply route in the morning, at noon, in the evening, and at bedtime. 400 each 3   losartan (COZAAR) 25 MG tablet Take 1 tablet (25 mg total) by mouth daily. 90 tablet 3   Melatonin 12 MG TABS Take 1 tablet by mouth daily. 1 tab at bedtime     metoprolol succinate (TOPROL XL) 25 MG 24 hr tablet Take 1 tablet (25 mg total) by mouth daily. 90 tablet 3   Naphazoline-Pheniramine (EYE ALLERGY RELIEF OP) Place 1 drop into both eyes daily as needed (allergies).     nitroGLYCERIN (NITROSTAT) 0.4 MG SL tablet Place 1 tablet (0.4 mg total) under the tongue every 5 (five) minutes as needed for chest pain. 25 tablet 3   vitamin B-12 (CYANOCOBALAMIN) 1000 MCG tablet Take 1,000 mcg by mouth daily.     vitamin C (ASCORBIC ACID) 500 MG tablet Take 500 mg by mouth daily.     Zinc 50 MG CAPS Take 50 mg by mouth daily.     amoxicillin (AMOXIL) 500 MG capsule Take 4 capsules by mouth 1 hour prior to dental appointments (Patient not taking: Reported on 12/26/2022) 4 capsule 3   loperamide (IMODIUM A-D) 2 MG tablet Take 1-2 mg by mouth 4 (four) times daily as needed for diarrhea or loose stools. (Patient not taking: Reported on 12/29/2022)     No current facility-administered medications for this visit.      Marland Kitchen  PHYSICAL EXAMINATION: ECOG PERFORMANCE STATUS: 0 - Asymptomatic  Vitals:   12/29/22 1020  BP: (!) 143/54  Pulse: 65  Resp: 17  Temp: 98.8 F (37.1 C)  SpO2: 100%   Filed Weights   12/29/22 1020  Weight: 153 lb 3.2 oz (69.5 kg)    Physical Exam Constitutional:      Comments: Ambulating independently.  Alone.  HENT:     Head: Normocephalic and atraumatic.     Mouth/Throat:     Pharynx: No oropharyngeal exudate.  Eyes:     Pupils: Pupils are equal, round, and reactive to light.  Cardiovascular:     Rate and Rhythm: Normal rate and regular rhythm.   Pulmonary:     Effort: No respiratory distress.     Breath sounds: No wheezing.  Abdominal:     General: Bowel sounds are normal. There is no distension.     Palpations: Abdomen is soft. There is no mass.     Tenderness: There is no abdominal tenderness. There is no guarding or rebound.  Musculoskeletal:        General: No tenderness. Normal range of motion.     Cervical back: Normal range of motion and neck supple.  Skin:    General: Skin is warm.     Comments: Bilateral mastectomies noted.  No lumps or bumps noted.  Positive chest wall telangiectasia.  Neurological:     Mental Status: She is alert and oriented to person, place, and time.  Psychiatric:        Mood and Affect: Affect normal.      LABORATORY DATA:  I have reviewed the data as listed Lab Results  Component Value Date   WBC 6.5 12/29/2022   HGB 11.1 (L) 12/29/2022   HCT 34.7 (L) 12/29/2022   MCV 95.6 12/29/2022   PLT 123 (L) 12/29/2022   Recent Labs    06/23/22 1056 07/24/22 1050 12/18/22 0923 12/29/22 0957  NA 143 139 140 138  K 4.4 4.7 4.8 4.5  CL 100 101 102 104  CO2 29 29 27 27   GLUCOSE 131* 115* 93 90  BUN 24 28* 27 25*  CREATININE 1.54* 1.37* 1.43* 1.17*  CALCIUM 10.6* 9.3 10.0 8.9  GFRNONAA  --   --   --  50*  PROT 7.4  --  7.0 7.3  ALBUMIN 4.7  --  4.4 3.9  AST 24  --  28 29  ALT 16  --  18 18  ALKPHOS 106  --  105 90  BILITOT 0.4  --  0.4 0.7    RADIOGRAPHIC STUDIES: I have personally reviewed the radiological images as listed and agreed with the findings in the report. ECHOCARDIOGRAM COMPLETE  Result Date: 12/08/2022    ECHOCARDIOGRAM REPORT   Patient Name:   Molly Hill Date of Exam: 12/08/2022 Medical Rec #:  [id number]      Height:       60.0 in Accession #:    [id number]     Weight:       150.2 lb Date of Birth:  1951-07-26     BSA:          1.653 m Patient Age:    52 years       BP:           118/68 mmHg Patient Gender: F              HR:           71 bpm. Exam Location:   [address] Procedure: 2D Echo, Cardiac Doppler and Color Doppler Indications:    Z95.2 Status post TAVR  History:        Patient has prior history of Echocardiogram examinations, most                 recent 12/09/2021. CAD and Previous Myocardial Infarction,                 TAVR-46mm Edwards S3UR; Risk Factors:Hypertension, Diabetes and  Dyslipidemia.  Sonographer:    Cresenciano Lick RDCS Referring Phys: [id number] [place]  1. Trivial PVL seen at the 3 o'clock position on PSAX. A 23 mm Sapien valve placed with a mean gradient 8 mm Hg and normal DVI, with normal other prosthetic parameters.. The aortic valve has been repaired/replaced. Aortic valve regurgitation is trivial.  2. Left ventricular ejection fraction, by estimation, is 60 to 65%. The left ventricle has normal function. The left ventricle has no regional wall motion abnormalities. There is severe asymmetric left ventricular hypertrophy of the basal-septal segment. Left ventricular diastolic function could not be evaluated.  3. Right ventricular systolic function is normal. The right ventricular size is normal. Tricuspid regurgitation signal is inadequate for assessing PA pressure.  4. Left atrial size was mildly dilated.  5. The mitral valve is abnormal. Mild mitral valve regurgitation. The mean mitral valve gradient is 4.0 mmHg with average heart rate of 64 bpm. Severe mitral annular calcification.  6. The inferior vena cava is normal in size with greater than 50% respiratory variability, suggesting right atrial pressure of 3 mmHg. Comparison(s): Prior images reviewed side by side. PVL is trivial but not seen in 11/09/21 study. Mitral valve gradients have improved with slower heart rate and with new mild mitral regurgitation. FINDINGS  Left Ventricle: Left ventricular ejection fraction, by estimation, is 60 to 65%. The left ventricle has normal function. The left ventricle has no regional wall motion  abnormalities. The left ventricular internal cavity size was small. There is severe asymmetric left ventricular hypertrophy of the basal-septal segment. Left ventricular diastolic function could not be evaluated due to mitral annular calcification (moderate or greater). Left ventricular diastolic function could not be evaluated. Right Ventricle: The right ventricular size is normal. No increase in right ventricular wall thickness. Right ventricular systolic function is normal. Tricuspid regurgitation signal is inadequate for assessing PA pressure. Left Atrium: Left atrial size was mildly dilated. Right Atrium: Right atrial size was normal in size. Pericardium: There is no evidence of pericardial effusion. Mitral Valve: The mitral valve is abnormal. Severe mitral annular calcification. Mild mitral valve regurgitation. The mean mitral valve gradient is 4.0 mmHg with average heart rate of 64 bpm. Tricuspid Valve: The tricuspid valve is normal in structure. Tricuspid valve regurgitation is not demonstrated. No evidence of tricuspid stenosis. Aortic Valve: Trivial PVL seen at the 3 o'clock position on PSAX. A 23 mm Sapien valve placed with a mean gradient 8 mm Hg and normal DVI, with normal other prosthetic parameters. The aortic valve has been repaired/replaced. Aortic valve regurgitation is  trivial. Aortic valve mean gradient measures 8.0 mmHg. Aortic valve peak gradient measures 13.0 mmHg. Pulmonic Valve: The pulmonic valve was grossly normal. Pulmonic valve regurgitation is mild. No evidence of pulmonic stenosis. Aorta: The aortic root and ascending aorta are structurally normal, with no evidence of dilitation. Venous: The inferior vena cava is normal in size with greater than 50% respiratory variability, suggesting right atrial pressure of 3 mmHg. IAS/Shunts: No atrial level shunt detected by color flow Doppler.  LEFT VENTRICLE PLAX 2D LVIDd:         3.80 cm Diastology LVIDs:         1.90 cm LV e' medial:    4.79  cm/s LV PW:         1.00 cm LV E/e' medial:  27.5 LV IVS:        1.30 cm LV e' lateral:   4.79 cm/s  LV E/e' lateral: 27.5  RIGHT VENTRICLE             IVC RV Basal diam:  3.20 cm     IVC diam: 1.20 cm RV S prime:     10.65 cm/s TAPSE (M-mode): 2.1 cm LEFT ATRIUM             Index        RIGHT ATRIUM          Index LA diam:        5.20 cm 3.15 cm/m   RA Area:     9.01 cm LA Vol (A2C):   50.3 ml 30.43 ml/m  RA Volume:   17.80 ml 10.77 ml/m LA Vol (A4C):   61.7 ml 37.33 ml/m LA Biplane Vol: 56.0 ml 33.88 ml/m  AORTIC VALVE AV Vmax:           180.20 cm/s AV Vmean:          124.400 cm/s AV VTI:            0.413 m AV Peak Grad:      13.0 mmHg AV Mean Grad:      8.0 mmHg LVOT Vmax:         95.65 cm/s LVOT Vmean:        63.650 cm/s LVOT VTI:          0.228 m LVOT/AV VTI ratio: 0.55  AORTA Ao Root diam: 2.60 cm Ao Asc diam:  2.60 cm MITRAL VALVE MV Area (PHT): 2.78 cm     SHUNTS MV Mean grad:  4.0 mmHg     Systemic VTI: 0.23 m MV Decel Time: 273 msec MV E velocity: 131.50 cm/s MV A velocity: 135.50 cm/s MV E/A ratio:  0.97 Rudean Haskell MD Electronically signed by Rudean Haskell MD Signature Date/Time: 12/08/2022/12:25:55 PM    Final      ASSESSMENT & PLAN:   Carcinoma of overlapping sites of right breast in female, estrogen receptor negative ([place]) Bilateral breast cancer triple negative [September 2016]-s/p BIL Mastectomy. No evidence of recurrence.  Continue surveillance.  No mammograms.  Stable.   # Anemia mild hemoglobin 11-12/? Sec to CKD- III- Stable.   # Intermittent thrombocytopenia: -slightly low at 123[normal is 150].  Question ITP versus others.  Pt awaiting follow-up with family doctor in about 3 months.  Patient knows to call us back if any concerns.  # Bone density: [endocrinologist]- Relcast q 12 months. Stable.   # CKD- stage III- GFR 50 [DM-Endo/HTN]-discussed with patient the importance of optimal control of blood pressure/blood glucose levels.    Stable.   # # CAD/MI [dec, [id number]; TAVR [[place]- Jan 2023]- stable  # DISPOSITION:  # Follow up in 12 months MD; labs- cbc/cmp; - Dr.B   All questions were answered. The patient knows to call the clinic with any problems, questions or concerns.    Cammie Sickle, MD 12/29/2022 11:15 AM

## 2022-12-29 NOTE — Patient Instructions (Signed)
#   Please follow-up with your family doctor in about 3 months-regarding platelet count-slightly low at 123[normal is 150].  If still running low/lower-please reach out to [place].

## 2022-12-29 NOTE — Assessment & Plan Note (Signed)
Bilateral breast cancer triple negative [September 2016]-s/p BIL Mastectomy. No evidence of recurrence.  Continue surveillance.  No mammograms.  Stable.   # Anemia mild hemoglobin 11-12/? Sec to CKD- III- Stable.   # Intermittent thrombocytopenia: -slightly low at 123[normal is 150].  Question ITP versus others.  Pt awaiting follow-up with family doctor in about 3 months.  Patient knows to call us back if any concerns.  # Bone density: [endocrinologist]- Relcast q 12 months. Stable.   # CKD- stage III- GFR 50 [DM-Endo/HTN]-discussed with patient the importance of optimal control of blood pressure/blood glucose levels.   Stable.   # # CAD/MI [dec, [id number]; TAVR [[place]- Jan 2023]- stable  # DISPOSITION:  # Follow up in 12 months MD; labs- cbc/cmp; - Dr.B

## 2022-12-29 NOTE — Progress Notes (Signed)
Patient doing well. No concerns.Marland Kitchen

## 2023-02-03 ENCOUNTER — Encounter (HOSPITAL_BASED_OUTPATIENT_CLINIC_OR_DEPARTMENT_OTHER): Payer: Self-pay | Admitting: Emergency Medicine

## 2023-02-03 ENCOUNTER — Emergency Department (HOSPITAL_BASED_OUTPATIENT_CLINIC_OR_DEPARTMENT_OTHER)
Admission: EM | Admit: 2023-02-03 | Discharge: 2023-02-03 | Disposition: A | Discharge status: Home / Self Care | Payer: Medicare HMO | Attending: Emergency Medicine | Admitting: Emergency Medicine

## 2023-02-03 ENCOUNTER — Other Ambulatory Visit: Payer: Self-pay

## 2023-02-03 DIAGNOSIS — Z853 Personal history of malignant neoplasm of breast: Secondary | ICD-10-CM | POA: Insufficient documentation

## 2023-02-03 DIAGNOSIS — E109 Type 1 diabetes mellitus without complications: Secondary | ICD-10-CM | POA: Insufficient documentation

## 2023-02-03 DIAGNOSIS — S0501XA Injury of conjunctiva and corneal abrasion without foreign body, right eye, initial encounter: Secondary | ICD-10-CM | POA: Insufficient documentation

## 2023-02-03 DIAGNOSIS — X58XXXA Exposure to other specified factors, initial encounter: Secondary | ICD-10-CM | POA: Insufficient documentation

## 2023-02-03 DIAGNOSIS — I2510 Atherosclerotic heart disease of native coronary artery without angina pectoris: Secondary | ICD-10-CM | POA: Diagnosis not present

## 2023-02-03 DIAGNOSIS — I5032 Chronic diastolic (congestive) heart failure: Secondary | ICD-10-CM | POA: Insufficient documentation

## 2023-02-03 DIAGNOSIS — H5711 Ocular pain, right eye: Secondary | ICD-10-CM | POA: Diagnosis present

## 2023-02-03 DIAGNOSIS — I110 Hypertensive heart disease with heart failure: Secondary | ICD-10-CM | POA: Insufficient documentation

## 2023-02-03 MED ORDER — TETRACAINE HCL 0.5 % OP SOLN
2.0000 [drp] | Freq: Once | OPHTHALMIC | Status: AC
  Administered 2023-02-03: 2 [drp] via OPHTHALMIC
  Filled 2023-02-03: qty 4

## 2023-02-03 MED ORDER — FLUORESCEIN SODIUM 1 MG OP STRP
1.0000 | ORAL_STRIP | Freq: Once | OPHTHALMIC | Status: AC
  Administered 2023-02-03: 1 via OPHTHALMIC
  Filled 2023-02-03: qty 1

## 2023-02-03 MED ORDER — ERYTHROMYCIN 5 MG/GM OP OINT: TOPICAL_OINTMENT | OPHTHALMIC | 0 refills | Status: DC

## 2023-02-03 NOTE — ED Provider Notes (Signed)
Emergency Department Provider Note   I have reviewed the triage vital signs and the nursing notes.   HISTORY  Chief Complaint Eye Problem   HPI Molly Hill is a 72 y.o. 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  No prior history of glaucoma.  No injury to the eye.  Symptoms began today at around 10 AM.  No headache or fevers.  Additionally undergoing treatment for her eye condition with Dr. Alden Hipp completed course of steroid eyedrops.  She missed one of her medications yesterday continue to use her eye ointment.  She no longer wears contact lenses.   Past Medical History:  Diagnosis Date   Alopecia    Anemia    Arthritis    Bicuspid aortic valve    mild to moderate AS by echo 2022   Chronic diastolic (congestive) heart failure    Coronary arteriosclerosis in native artery    s/p PCI of LAD in 2013 & NSTEMI with staged DES to pLAD (10/2021)   DM (diabetes mellitus), type 1    GERD (gastroesophageal reflux disease)    History of breast cancer    Estrogen receptor negative. s/p chemo   Hyperglycemia    Hyperlipidemia    Hypertension    Mitral regurgitation    mild to moderate by echo 2020   Myocardial infarction 10/09/2021   S/P TAVR (transcatheter aortic valve replacement) 11/08/2021   s/p TAVR with a 23 mm Edwards S3UR via the TF approach by Dr. Excell Seltzer and Dr. Laneta Simmers   Tendinitis of left wrist     Review of Systems  Constitutional: No fever/chills Eyes: Right eye with decreased vision and pain/tearing.  Cardiovascular: Denies chest pain. Respiratory: Denies shortness of breath. Gastrointestinal: No abdominal pain Musculoskeletal: Negative for back pain. Skin: Negative for [place]. Neurological: Negative for headaches.  ____________________________________________   PHYSICAL EXAM:  VITAL SIGNS: ED Triage Vitals  Enc Vitals Group     BP 02/03/23 1446 (!) 190/74      Pulse Rate 02/03/23 1446 85     Resp 02/03/23 1446 20     Temp 02/03/23 1446 98.8 F (37.1 C)     Temp Source 02/03/23 1446 Oral     SpO2 02/03/23 1446 98 %     Weight 02/03/23 1445 142 lb (64.4 kg)     Height 02/03/23 1445 5' (1.524 m)    Constitutional: Alert and oriented. Well appearing and in no acute distress. Eyes: Conjunctivae are normal. PERRL. EOMI. No periorbital cellulitis. IOP: 22 right and 21 left. Fluorescein stain shows central dye uptake consistent with corneal abrasion. Pain improved immediately with tetracaine.  Head: Atraumatic. Nose: No congestion/rhinnorhea. Mouth/Throat: Mucous membranes are moist.   Neck: No stridor.   Cardiovascular: Normal rate, regular rhythm. Good peripheral circulation. Grossly normal heart sounds.   Respiratory: Normal respiratory effort.  No retractions. Lungs CTAB. Gastrointestinal: Soft and nontender. No distention.  Musculoskeletal: No lower extremity tenderness nor edema. No gross deformities of extremities. Neurologic:  Normal speech and language. No gross focal neurologic deficits are appreciated.  Skin:  Skin is warm, dry and intact. No Fahl noted.   ____________________________________________   PROCEDURES  Procedure(s) performed:   Procedures  None  ____________________________________________   INITIAL IMPRESSION / ASSESSMENT AND PLAN / ED COURSE  Pertinent labs & imaging results that were available during my care of the patient were reviewed by me and considered in my medical  decision making (see chart for details).   This patient is Presenting for Evaluation of eye pain, which does require a range of treatment options, and is a complaint that involves a high risk of morbidity and mortality.  The Differential Diagnoses include corneal abrasion, acute glaucoma, CRAO, globe injury, etc.  Critical Interventions-    Medications  tetracaine (PONTOCAINE) 0.5 % ophthalmic solution 2 drop (has no administration in time  range)  fluorescein ophthalmic strip 1 strip (has no administration in time range)    Reassessment after intervention:  pain improved.   Consulted with ophthalmology, Dr. Algernon Huxley. Plan to treat with erythromycin ointment and follow up with her ophthalmology team on Monday. Can call his office over the weekend with any additional issues.    I did obtain Additional Historical Information from daughter at bedside.  Medical Decision Making: Summary:  Patient presents with right eye pain and tearing.  On fluorescein there appears to be dye uptake centrally consistent with a corneal abrasion.  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  Will reach out to on-call ophthalmology for guidance but suspect to manage abrasion and follow with her ophthalmology.   Reevaluation with update and discussion with patient.  She is feeling well.  We discussed plan for close follow-up and ED return precaution.  Stable for discharge.   Patient's presentation is most consistent with acute presentation with potential threat to life or bodily function.   Disposition: discharge  ____________________________________________  FINAL CLINICAL IMPRESSION(S) / ED DIAGNOSES  Final diagnoses:  Abrasion of right cornea, initial encounter     NEW OUTPATIENT MEDICATIONS STARTED DURING THIS VISIT:  New Prescriptions   ERYTHROMYCIN OPHTHALMIC OINTMENT    Place a 1/2 inch ribbon of ointment into the right lower eyelid 3 times daily for 7 days.    Note:  This document was prepared using Dragon voice recognition software and may include unintentional dictation errors.  Alona Bene, MD, [facility] Emergency Medicine    Marquasia Schmieder, Arlyss Repress, MD 02/03/23 [phone]

## 2023-02-03 NOTE — Discharge Instructions (Signed)
You were seen in the emergency room today with a scratch on the surface of the right eye.  I am starting on some antibiotic ointment.  Please call your ophthalmologist on Monday for close follow-up.  Over the weekend, if you have issues, you could call Dr. Gordy Councilman office at [phone] or return to the ED for evaluation.

## 2023-02-03 NOTE — ED Triage Notes (Signed)
Pt arrives to ED with c/o right eye watering, stabbing pain, and decrease in vision. She feels like something is stuck in her eye. Optometry is working her up for map dot finger dystrophy.

## 2023-02-03 NOTE — ED Notes (Signed)
MD in room, family at bedside.

## 2023-04-13 ENCOUNTER — Other Ambulatory Visit: Payer: Self-pay | Admitting: Cardiology

## 2023-06-11 ENCOUNTER — Ambulatory Visit (HOSPITAL_COMMUNITY)
Admission: RE | Admit: 2023-06-11 | Discharge: 2023-06-11 | Disposition: A | Discharge status: Home / Self Care | Payer: Medicare HMO | Source: Ambulatory Visit | Attending: Surgery | Admitting: Surgery

## 2023-06-11 ENCOUNTER — Other Ambulatory Visit (HOSPITAL_COMMUNITY): Payer: Self-pay | Admitting: Surgery

## 2023-06-11 DIAGNOSIS — I83891 Varicose veins of right lower extremities with other complications: Secondary | ICD-10-CM | POA: Diagnosis not present

## 2023-06-11 DIAGNOSIS — I83899 Varicose veins of unspecified lower extremities with other complications: Secondary | ICD-10-CM | POA: Diagnosis present

## 2023-06-13 ENCOUNTER — Ambulatory Visit: Payer: Medicare HMO | Admitting: Vascular Surgery

## 2023-06-13 ENCOUNTER — Encounter: Payer: Self-pay | Admitting: Vascular Surgery

## 2023-06-13 VITALS — BP 135/61 | HR 62 | Temp 97.9°F | Resp 18 | Ht 59.0 in | Wt 149.9 lb

## 2023-06-13 DIAGNOSIS — I83811 Varicose veins of right lower extremities with pain: Secondary | ICD-10-CM | POA: Diagnosis not present

## 2023-06-13 NOTE — Progress Notes (Unsigned)
ASSESSMENT & PLAN   CHRONIC VENOUS INSUFFICIENCY: This patient does have hyperpigmentation consistent with CEAP C4 venous disease.  However, she is not having significant symptoms.  Her main complaint is her spider veins which she has in both legs.  We have discussed conservative measures.  I have encouraged her to avoid prolonged sitting and standing.  We have discussed the importance of exercise specifically walking and water aerobics.  We have discussed the use of compression stockings.  I have recommended a knee-high stocking with a gradient of 15 to 20 mmHg if she is is traveling or will be sitting and standing for a long period of time.  We discussed the importance of leg elevation and the proper positioning for this.  We also discussed the importance of maintaining a healthy weight.  If she would like to have sclerotherapy for her spider veins I have given her the number for Cherene Julian, RN who does our sclerotherapy.   REASON FOR CONSULT:    Varicose veins.  The consult is requested by Dr. Theador Hawthorne.   HPI:   Molly Hill is a 72 y.o. female who was referred with varicose veins.  I have reviewed the records from the referring office.  The patient was seen by [facility] and was noted to have varicose veins.  She was referred for vascular consultation.  On my history the patient has had spider veins in both legs for 20 years.  She denies any previous history of DVT.  She has had no previous venous procedures.  She does not describe significant aching pain or heaviness in her legs with standing or sitting.  She does not complain of significant leg swelling.   She underwent a TAVR 3 years ago and has done well from that standpoint.  Past Medical History:  Diagnosis Date   Alopecia    Anemia    Arthritis    Bicuspid aortic valve    mild to moderate AS by echo 2022   Chronic diastolic (congestive) heart failure (HCC)    Coronary arteriosclerosis in native  artery    s/p PCI of LAD in 2013 & NSTEMI with staged DES to pLAD (10/2021)   DM (diabetes mellitus), type 1 (HCC)    GERD (gastroesophageal reflux disease)    History of breast cancer    Estrogen receptor negative. s/p chemo   Hyperglycemia    Hyperlipidemia    Hypertension    Mitral regurgitation    mild to moderate by echo 2020   Myocardial infarction [facility]) 10/09/2021   S/P TAVR (transcatheter aortic valve replacement) 11/08/2021   s/p TAVR with a 23 mm Edwards S3UR via the TF approach by Dr. Excell Seltzer and Dr. Laneta Simmers   Tendinitis of left wrist     Family History  Problem Relation Age of Onset   Heart attack Mother    CAD Father        CABG   Diabetes Father    Stroke Father    Colon cancer Father        69s   Diabetes Brother    Diabetes Paternal Grandfather    Heart attack Paternal Grandfather    Breast cancer Maternal Aunt        89s    SOCIAL HISTORY: Social History   Tobacco Use   Smoking status: Former    Current packs/day: 0.00    Types: Cigarettes    Start date: 08/14/1976    Quit date: 08/15/1979  Years since quitting: 43.8   Smokeless tobacco: Never   Tobacco comments:    1 pack would last a week.  Substance Use Topics   Alcohol use: Never    Allergies  Allergen Reactions   Codeine     Hyper with vomiting   Onion Diarrhea, Nausea And Vomiting and Other (See Comments)    syncope   Reglan [Metoclopramide]     "pulls muscle to side" "feel paralyzed"    Current Outpatient Medications  Medication Sig Dispense Refill   acetaminophen (TYLENOL) 500 MG tablet Take 1,000 mg by mouth every 6 (six) hours as needed for moderate pain or headache.     amLODipine (NORVASC) 2.5 MG tablet TAKE 1 TABLET BY MOUTH EVERY DAY 90 tablet 3   aspirin EC 81 MG tablet Take 81 mg by mouth at bedtime.     atorvastatin (LIPITOR) 80 MG tablet TAKE 1 TABLET BY MOUTH EVERYDAY AT BEDTIME 90 tablet 3   BIOTIN PO Take by mouth daily.     Biotin w/ Vitamins C & E  (HAIR/SKIN/NAILS PO) Take 1 capsule by mouth daily.     bisacodyl (DULCOLAX) 5 MG EC tablet Take 2 tablets (10 mg total) by mouth daily as needed for moderate constipation. 30 tablet 0   Calcium Carb-Cholecalciferol (CALCIUM 600 + D PO) Take 1 tablet by mouth daily.     cholecalciferol (VITAMIN D) 25 MCG (1000 UNIT) tablet Take 1,000 Units by mouth daily.     Continuous Blood Gluc Sensor (DEXCOM G7 SENSOR) MISC 1 Device by Does not apply route every 30 (thirty) days.     docusate sodium (COLACE) 100 MG capsule Take 2 capsules (200 mg total) by mouth daily. 10 capsule 0   erythromycin ophthalmic ointment Place a 1/2 inch ribbon of ointment into the right lower eyelid 3 times daily for 7 days. 3.5 g 0   fexofenadine (ALLEGRA) 180 MG tablet Take 180 mg by mouth daily as needed for allergies.     fluticasone (FLONASE) 50 MCG/ACT nasal spray Place 1 spray into both nostrils as needed for allergies.     glucose blood test strip 1 each by Other route 4 (four) times daily -  before meals and at bedtime. Use as instructed     insulin aspart (NOVOLOG FLEXPEN) 100 UNIT/ML FlexPen Max daily 45 units per scale 45 mL 3   insulin degludec (TRESIBA FLEXTOUCH) 100 UNIT/ML FlexTouch Pen Inject 32 Units into the skin daily. 30 mL 4   Insulin Pen Needle 32G X 4 MM MISC 1 Device by Does not apply route in the morning, at noon, in the evening, and at bedtime. 400 each 3   loperamide (IMODIUM A-D) 2 MG tablet Take 1-2 mg by mouth 4 (four) times daily as needed for diarrhea or loose stools.     losartan (COZAAR) 25 MG tablet Take 1 tablet (25 mg total) by mouth daily. 90 tablet 3   Melatonin 12 MG TABS Take 1 tablet by mouth daily. 1 tab at bedtime     metoprolol succinate (TOPROL XL) 25 MG 24 hr tablet Take 1 tablet (25 mg total) by mouth daily. 90 tablet 3   Naphazoline-Pheniramine (EYE ALLERGY RELIEF OP) Place 1 drop into both eyes daily as needed (allergies).     nitroGLYCERIN (NITROSTAT) 0.4 MG SL tablet Place 1  tablet (0.4 mg total) under the tongue every 5 (five) minutes as needed for chest pain. 25 tablet 3   vitamin B-12 (CYANOCOBALAMIN) 1000 MCG tablet  Take 1,000 mcg by mouth daily.     vitamin C (ASCORBIC ACID) 500 MG tablet Take 500 mg by mouth daily.     Zinc 50 MG CAPS Take 50 mg by mouth daily.     amoxicillin (AMOXIL) 500 MG capsule Take 4 capsules by mouth 1 hour prior to dental appointments (Patient not taking: Reported on 12/26/2022) 4 capsule 3   No current facility-administered medications for this visit.    REVIEW OF SYSTEMS:  [X]  denotes positive finding, [ ]  denotes negative finding Cardiac  Comments:  Chest pain or chest pressure:    Shortness of breath upon exertion:    Short of breath when lying flat:    Irregular heart rhythm:        Vascular    Pain in calf, thigh, or hip brought on by ambulation:    Pain in feet at night that wakes you up from your sleep:     Blood clot in your veins:    Leg swelling:         Pulmonary    Oxygen at home:    Productive cough:     Wheezing:         Neurologic    Sudden weakness in arms or legs:     Sudden numbness in arms or legs:     Sudden onset of difficulty speaking or slurred speech:    Temporary loss of vision in one eye:     Problems with dizziness:         Gastrointestinal    Blood in stool:     Vomited blood:         Genitourinary    Burning when urinating:     Blood in urine:        Psychiatric    Major depression:         Hematologic    Bleeding problems:    Problems with blood clotting too easily:        Skin    Rashes or ulcers:        Constitutional    Fever or chills:    -  PHYSICAL EXAM:   Vitals:   06/13/23 0921  BP: 135/61  Pulse: 62  Resp: 18  Temp: 97.9 F (36.6 C)  TempSrc: Temporal  SpO2: 98%  Weight: 149 lb 14.4 oz (68 kg)  Height: 4\' 11"  (1.499 m)   Body mass index is 30.28 kg/m. GENERAL: The patient is a well-nourished female, in no acute distress. The vital signs are  documented above. CARDIAC: There is a regular rate and rhythm.  VASCULAR: I do not detect carotid bruits. I could not palpate pedal pulses however she had a biphasic posterior tibial and dorsalis pedis signal bilaterally. She has multiple areas of telangiectasias and some reticular veins.  There is 1 prominent area in the medial right thigh as documented below that does simply appear to be a cluster of spider veins.  This would be more challenging to treat as the skin overlying this area is fairly thin and she does have a history of diabetes.        PULMONARY: There is good air exchange bilaterally without wheezing or rales. MUSCULOSKELETAL: There are no major deformities. NEUROLOGIC: No focal weakness or paresthesias are detected. SKIN: There are no ulcers or rashes noted. PSYCHIATRIC: The patient has a normal affect.  DATA:    VENOUS REFLUX STUDY: I have reviewed the venous reflux study that was done on 06/11/2023.  This  was of the right lower extremity only.  There was no evidence of DVT.  There was no deep venous reflux.  There was no reflux at the saphenofemoral junction.  There were a couple short segments of reflux in the right great saphenous vein however the vein was small in these areas.  The results of the study are summarized on the diagram below.    Waverly [facility]

## 2023-07-08 ENCOUNTER — Emergency Department (HOSPITAL_BASED_OUTPATIENT_CLINIC_OR_DEPARTMENT_OTHER)
Admission: EM | Admit: 2023-07-08 | Discharge: 2023-07-08 | Disposition: A | Discharge status: Home / Self Care | Payer: Medicare HMO | Attending: Emergency Medicine | Admitting: Emergency Medicine

## 2023-07-08 ENCOUNTER — Encounter (HOSPITAL_BASED_OUTPATIENT_CLINIC_OR_DEPARTMENT_OTHER): Payer: Self-pay | Admitting: Emergency Medicine

## 2023-07-08 ENCOUNTER — Other Ambulatory Visit: Payer: Self-pay

## 2023-07-08 ENCOUNTER — Emergency Department (HOSPITAL_BASED_OUTPATIENT_CLINIC_OR_DEPARTMENT_OTHER): Payer: Medicare HMO

## 2023-07-08 DIAGNOSIS — E119 Type 2 diabetes mellitus without complications: Secondary | ICD-10-CM | POA: Diagnosis not present

## 2023-07-08 DIAGNOSIS — Z794 Long term (current) use of insulin: Secondary | ICD-10-CM | POA: Diagnosis not present

## 2023-07-08 DIAGNOSIS — Z79899 Other long term (current) drug therapy: Secondary | ICD-10-CM | POA: Diagnosis not present

## 2023-07-08 DIAGNOSIS — R42 Dizziness and giddiness: Secondary | ICD-10-CM | POA: Diagnosis present

## 2023-07-08 DIAGNOSIS — I2510 Atherosclerotic heart disease of native coronary artery without angina pectoris: Secondary | ICD-10-CM | POA: Insufficient documentation

## 2023-07-08 DIAGNOSIS — I10 Essential (primary) hypertension: Secondary | ICD-10-CM | POA: Insufficient documentation

## 2023-07-08 DIAGNOSIS — Z7982 Long term (current) use of aspirin: Secondary | ICD-10-CM | POA: Insufficient documentation

## 2023-07-08 LAB — CBC WITH DIFFERENTIAL/PLATELET
Abs Immature Granulocytes: 0.04 10*3/uL (ref 0.00–0.07)
Basophils Absolute: 0 10*3/uL (ref 0.0–0.1)
Basophils Relative: 0 %
Eosinophils Absolute: 0 10*3/uL (ref 0.0–0.5)
Eosinophils Relative: 0 %
HCT: 38.8 % (ref 36.0–46.0)
Hemoglobin: 12.9 g/dL (ref 12.0–15.0)
Immature Granulocytes: 0 %
Lymphocytes Relative: 14 %
Lymphs Abs: 1.4 10*3/uL (ref 0.7–4.0)
MCH: 30.8 pg (ref 26.0–34.0)
MCHC: 33.2 g/dL (ref 30.0–36.0)
MCV: 92.6 fL (ref 80.0–100.0)
Monocytes Absolute: 0.5 10*3/uL (ref 0.1–1.0)
Monocytes Relative: 5 %
Neutro Abs: 8.2 10*3/uL — ABNORMAL HIGH (ref 1.7–7.7)
Neutrophils Relative %: 81 %
Platelets: 177 10*3/uL (ref 150–400)
RBC: 4.19 MIL/uL (ref 3.87–5.11)
RDW: 13 % (ref 11.5–15.5)
WBC: 10.2 10*3/uL (ref 4.0–10.5)
nRBC: 0 % (ref 0.0–0.2)

## 2023-07-08 LAB — BASIC METABOLIC PANEL
Anion gap: 8 (ref 5–15)
BUN: 24 mg/dL — ABNORMAL HIGH (ref 8–23)
CO2: 32 mmol/L (ref 22–32)
Calcium: 10.5 mg/dL — ABNORMAL HIGH (ref 8.9–10.3)
Chloride: 100 mmol/L (ref 98–111)
Creatinine, Ser: 1.2 mg/dL — ABNORMAL HIGH (ref 0.44–1.00)
GFR, Estimated: 48 mL/min — ABNORMAL LOW (ref >60)
Glucose, Bld: 105 mg/dL — ABNORMAL HIGH (ref 70–99)
Potassium: 4 mmol/L (ref 3.5–5.1)
Sodium: 140 mmol/L (ref 135–145)

## 2023-07-08 MED ORDER — MECLIZINE HCL 25 MG PO TABS: 25.0000 mg | ORAL_TABLET | Freq: Three times a day (TID) | ORAL | 0 refills | Status: AC | PRN

## 2023-07-08 MED ORDER — IOHEXOL 350 MG/ML SOLN
75.0000 mL | Freq: Once | INTRAVENOUS | Status: AC | PRN
  Administered 2023-07-08: 100 mL via INTRAVENOUS

## 2023-07-08 MED ORDER — SODIUM CHLORIDE 0.9 % IV BOLUS
1000.0000 mL | Freq: Once | INTRAVENOUS | Status: AC
  Administered 2023-07-08: 1000 mL via INTRAVENOUS

## 2023-07-08 MED ORDER — ONDANSETRON HCL 4 MG/2ML IJ SOLN
4.0000 mg | Freq: Once | INTRAMUSCULAR | Status: AC
  Administered 2023-07-08: 4 mg via INTRAVENOUS
  Filled 2023-07-08: qty 2

## 2023-07-08 MED ORDER — DIAZEPAM 5 MG/ML IJ SOLN
2.5000 mg | Freq: Once | INTRAMUSCULAR | Status: AC
  Administered 2023-07-08: 2.5 mg via INTRAVENOUS
  Filled 2023-07-08: qty 2

## 2023-07-08 NOTE — ED Notes (Signed)
Pt resting. Denies dizziness at this time. Family at bedside and call light in reach.

## 2023-07-08 NOTE — ED Triage Notes (Addendum)
Pt reports that she woke up with dizziness this AM that is subsides when pt is lying down with eyes closed, but worsens with positional changes. Pt with hx of vertigo. No focal neuro deficits noted during triage. Pt took 50 mg of dramamine this AM with mild relief.   Pt did mention she had some neck pain on the R on 9/18 that hurt worse with movement, but no dizziness at that time.

## 2023-07-08 NOTE — ED Notes (Addendum)
Unsuccessful IV attempt, RT FA, pt tolerated well. 2nd attempt successful

## 2023-07-08 NOTE — Discharge Instructions (Signed)
Meclizine as prescribed to help with your symptoms.  Follow-up with ENT and your primary care doctor.  Please return if symptoms worsen as discussed to consider MRI to further rule out stroke.

## 2023-07-08 NOTE — ED Notes (Signed)
Pt returned from CT

## 2023-07-08 NOTE — ED Provider Notes (Signed)
[facility] AT [facility], [place] Provider Note   CSN: [id number] Arrival date & time: 07/08/23  1452     History  Chief Complaint  Patient presents with   Dizziness    Anaili Sroufe Mosqueda is a 72 y.o. female.  Is here with dizziness.  History of hypertension high cholesterol, CAD, diabetes, high cholesterol, status post TAVR, vertigo.  Vertigo type symptoms here this morning.  Took meclizine and Dramamine with some improvement.  Still feeling pretty nauseous.  Still feels off balance.  Denies any weakness numbness tingling or vision loss or loss of coordination or slurred speech.  No history of stroke.  Nothing makes it worse or better except for movement makes it worse especially when turning her head to the left.  The history is provided by the patient.       Home Medications Prior to Admission medications   Medication Sig Start Date End Date Taking? Authorizing Provider  meclizine (ANTIVERT) 25 MG tablet Take 1 tablet (25 mg total) by mouth 3 (three) times daily as needed for dizziness. 07/08/23  Yes Masaji Billups, DO  acetaminophen (TYLENOL) 500 MG tablet Take 1,000 mg by mouth every 6 (six) hours as needed for moderate pain or headache.    [provider]  amLODipine (NORVASC) 2.5 MG tablet TAKE 1 TABLET BY MOUTH EVERY DAY 04/13/23   Lewayne Bunting, MD  amoxicillin (AMOXIL) 500 MG capsule Take 4 capsules by mouth 1 hour prior to dental appointments Patient not taking: Reported on 12/26/2022 11/16/21   Janetta Hora, PA-C  aspirin EC 81 MG tablet Take 81 mg by mouth at bedtime.    [provider]  atorvastatin (LIPITOR) 80 MG tablet TAKE 1 TABLET BY MOUTH EVERYDAY AT BEDTIME 09/22/22   Tonny Bollman, MD  BIOTIN PO Take by mouth daily.    [provider]  Biotin w/ Vitamins C & E (HAIR/SKIN/NAILS PO) Take 1 capsule by mouth daily.    [provider]  bisacodyl (DULCOLAX) 5 MG EC tablet Take 2 tablets (10 mg total) by  mouth daily as needed for moderate constipation. 10/14/21   Barrett, Joline Salt, PA-C  Calcium Carb-Cholecalciferol (CALCIUM 600 + D PO) Take 1 tablet by mouth daily.    [provider]  cholecalciferol (VITAMIN D) 25 MCG (1000 UNIT) tablet Take 1,000 Units by mouth daily.    [provider]  Continuous Blood Gluc Sensor (DEXCOM G7 SENSOR) MISC 1 Device by Does not apply route every 30 (thirty) days.    [provider]  docusate sodium (COLACE) 100 MG capsule Take 2 capsules (200 mg total) by mouth daily. 10/15/21   Barrett, Joline Salt, PA-C  erythromycin ophthalmic ointment Place a 1/2 inch ribbon of ointment into the right lower eyelid 3 times daily for 7 days. 02/03/23   Long, Arlyss Repress, MD  fexofenadine (ALLEGRA) 180 MG tablet Take 180 mg by mouth daily as needed for allergies.    [provider]  fluticasone (FLONASE) 50 MCG/ACT nasal spray Place 1 spray into both nostrils as needed for allergies.    [provider]  glucose blood test strip 1 each by Other route 4 (four) times daily -  before meals and at bedtime. Use as instructed    [provider]  insulin aspart (NOVOLOG FLEXPEN) 100 UNIT/ML FlexPen Max daily 45 units per scale 12/26/22   Shamleffer, Konrad Dolores, MD  insulin degludec (TRESIBA FLEXTOUCH) 100 UNIT/ML FlexTouch Pen Inject 32 Units into the  skin daily. 12/26/22   Shamleffer, Konrad Dolores, MD  Insulin Pen Needle 32G X 4 MM MISC 1 Device by Does not apply route in the morning, at noon, in the evening, and at bedtime. 12/26/22   Shamleffer, Konrad Dolores, MD  loperamide (IMODIUM A-D) 2 MG tablet Take 1-2 mg by mouth 4 (four) times daily as needed for diarrhea or loose stools.    [provider]  losartan (COZAAR) 25 MG tablet Take 1 tablet (25 mg total) by mouth daily. 09/22/22   Janetta Hora, PA-C  Melatonin 12 MG TABS Take 1 tablet by mouth daily. 1 tab at bedtime    [provider]  metoprolol  succinate (TOPROL XL) 25 MG 24 hr tablet Take 1 tablet (25 mg total) by mouth daily. 07/12/21   Quintella Reichert, MD  Naphazoline-Pheniramine (EYE ALLERGY RELIEF OP) Place 1 drop into both eyes daily as needed (allergies).    [provider]  nitroGLYCERIN (NITROSTAT) 0.4 MG SL tablet Place 1 tablet (0.4 mg total) under the tongue every 5 (five) minutes as needed for chest pain. 10/14/21   Lewayne Bunting, MD  vitamin B-12 (CYANOCOBALAMIN) 1000 MCG tablet Take 1,000 mcg by mouth daily.    [provider]  vitamin C (ASCORBIC ACID) 500 MG tablet Take 500 mg by mouth daily.    [provider]  Zinc 50 MG CAPS Take 50 mg by mouth daily.    [provider]      Allergies    Codeine, Onion, and Reglan [metoclopramide]    Review of Systems   Review of Systems  Physical Exam Updated Vital Signs BP (!) 168/67   Pulse 90   Temp 97.9 F (36.6 C) (Oral)   Resp 18   SpO2 100%  Physical Exam Vitals and nursing note reviewed.  Constitutional:      General: She is not in acute distress.    Appearance: She is well-developed. She is not ill-appearing.  HENT:     Head: Normocephalic and atraumatic.     Right Ear: Tympanic membrane normal.     Left Ear: Tympanic membrane normal.     Nose: Nose normal.     Mouth/Throat:     Mouth: Mucous membranes are moist.  Eyes:     Extraocular Movements: Extraocular movements intact.     Conjunctiva/sclera: Conjunctivae normal.     Pupils: Pupils are equal, round, and reactive to light.  Cardiovascular:     Rate and Rhythm: Normal rate and regular rhythm.     Pulses: Normal pulses.     Heart sounds: Normal heart sounds. No murmur heard. Pulmonary:     Effort: Pulmonary effort is normal. No respiratory distress.     Breath sounds: Normal breath sounds.  Abdominal:     Palpations: Abdomen is soft.     Tenderness: There is no abdominal tenderness.  Musculoskeletal:        General: No swelling.     Cervical back:  Normal range of motion and neck supple.  Skin:    General: Skin is warm and dry.     Capillary Refill: Capillary refill takes less than 2 seconds.  Neurological:     General: No focal deficit present.     Mental Status: She is alert and oriented to person, place, and time.     Cranial Nerves: No cranial nerve deficit.     Sensory: No sensory deficit.     Motor: No weakness.  Coordination: Coordination normal.     Comments: 5+ out of 5 strength strength throughout, normal sensation, no drift, normal finger-nose-finger, normal speech, unsteady gait, normal visual fields, no nystagmus, normal speech  Psychiatric:        Mood and Affect: Mood normal.     ED Results / Procedures / Treatments   Labs (all labs ordered are listed, but only abnormal results are displayed) Labs Reviewed  CBC WITH DIFFERENTIAL/PLATELET - Abnormal; Notable for the following components:      Result Value   Neutro Abs 8.2 (*)    All other components within normal limits  BASIC METABOLIC PANEL - Abnormal; Notable for the following components:   Glucose, Bld 105 (*)    BUN 24 (*)    Creatinine, Ser 1.20 (*)    Calcium 10.5 (*)    GFR, Estimated 48 (*)    All other components within normal limits    EKG EKG Interpretation Date/Time:  Sunday July 08 2023 15:14:49 EDT Ventricular Rate:  76 PR Interval:  156 QRS Duration:  82 QT Interval:  402 QTC Calculation: 452 R Axis:   68  Text Interpretation: Normal sinus rhythm Confirmed by Virgina Norfolk [phone]) on 07/08/2023 4:35:36 PM  Radiology CT ANGIO HEAD NECK W WO CM  Result Date: 07/08/2023 CLINICAL DATA:  Stroke suspected, dizziness EXAM: CT ANGIOGRAPHY HEAD AND NECK WITH AND WITHOUT CONTRAST TECHNIQUE: Multidetector CT imaging of the head and neck was performed using the standard protocol during bolus administration of intravenous contrast. Multiplanar CT image reconstructions and MIPs were obtained to evaluate the vascular anatomy. Carotid  stenosis measurements (when applicable) are obtained utilizing NASCET criteria, using the distal internal carotid diameter as the denominator. RADIATION DOSE REDUCTION: This exam was performed according to the departmental dose-optimization program which includes automated exposure control, adjustment of the mA and/or kV according to patient size and/or use of iterative reconstruction technique. CONTRAST:  OMNIPAQUE IOHEXOL 350 MG/ML SOLN COMPARISON:  No prior CTA available, correlation is made with 06/08/2020 MRI head FINDINGS: CT HEAD FINDINGS Brain: No evidence of acute infarct, hemorrhage, mass, mass effect, or midline shift. No hydrocephalus or extra-axial fluid collection. Periventricular white matter changes, likely the sequela of chronic small vessel ischemic disease. Vascular: No hyperdense vessel. Atherosclerotic calcifications in the intracranial carotid and vertebral arteries. Skull: Negative for fracture or focal lesion. Sinuses/Orbits: Mucosal thickening in the right maxillary sinus, with mild osseous hypertrophy, likely chronic sinusitis. Otherwise clear paranasal sinuses. Status post bilateral lens replacements. Other: The mastoid air cells are well aerated. CTA NECK FINDINGS Aortic arch: Two-vessel arch with a common origin of the brachiocephalic and left common carotid arteries. Imaged portion shows no evidence of aneurysm or dissection. No significant stenosis of the major arch vessel origins. Aortic atherosclerosis. Right carotid system: 60% stenosis in the proximal right ICA (series 8, image 196). 50% stenosis in the distal right CCA (series 8, image 200). No evidence of dissection. Left carotid system: 70% stenosis in the distal left CCA (series 8, image 208). 50% stenosis in the proximal left ICA (series 8, image 23). No evidence of dissection. Vertebral arteries: No evidence of dissection, occlusion, or hemodynamically significant stenosis (greater than 50%). Skeleton: No acute osseous  abnormality. Degenerative changes in the cervical spine. Other neck: Hypoenhancing nodules in the thyroid, which measure up to 5 mm, fortunately follow-up is currently indicated. (Reference: J Am Coll Radiol. 2015 Feb;12(2): 143-50) Upper chest: Scarring and ground-glass opacities of the right apex, which appears similar to the  10/23/2022 chest CT. Review of the MIP images confirms the above findings CTA HEAD FINDINGS Anterior circulation: Both internal carotid arteries are patent to the termini, with moderate stenosis in the bilateral cavernous ICA and left supraclinoid ICA, and severe stenosis in the proximal right supraclinoid ICA. A1 segments patent. Normal anterior communicating artery. Anterior cerebral arteries are patent to their distal aspects without significant stenosis. No M1 stenosis or occlusion. MCA branches perfused to their distal aspects without significant stenosis. Posterior circulation: Vertebral arteries patent to the vertebrobasilar junction without significant stenosis. Posterior inferior cerebellar arteries patent proximally. Basilar patent to its distal aspect without significant stenosis. Superior cerebellar arteries patent proximally. Patent P1 segments. Moderate stenosis in the proximal right P 2 (series 8, image 89). PCAs are otherwise perfused to their distal aspects without significant stenosis. The bilateral posterior communicating arteries are patent. Venous sinuses: As permitted by contrast timing, patent. Anatomic variants: None significant. No evidence of aneurysm or vascular malformation. Review of the MIP images confirms the above findings IMPRESSION: 1. No acute intracranial process. 2. No intracranial large vessel occlusion. Severe stenosis in the proximal right supraclinoid ICA, moderate stenosis in the bilateral cavernous ICA and left supraclinoid ICA, and moderate stenosis in the proximal right PCA. 3. 70% stenosis in the distal left CCA and 50% stenosis in the proximal  left ICA. 4. 60% stenosis in the proximal right ICA and 50% stenosis in the distal right CCA. 5. No hemodynamically significant stenosis in the vertebral arteries. 6. Aortic atherosclerosis. Aortic Atherosclerosis (ICD10-I70.0). Electronically Signed   By: Wiliam Ke M.D.   On: 07/08/2023 19:25    Procedures Procedures    Medications Ordered in ED Medications  diazepam (VALIUM) injection 2.5 mg (2.5 mg Intravenous Given 07/08/23 1736)  sodium chloride 0.9 % bolus 1,000 mL (0 mLs Intravenous Stopped 07/08/23 1912)  ondansetron (ZOFRAN) injection 4 mg (4 mg Intravenous Given 07/08/23 1736)  iohexol (OMNIPAQUE) 350 MG/ML injection 75 mL (100 mLs Intravenous Contrast Given 07/08/23 1817)    ED Course/ Medical Decision Making/ A&P                                 Medical Decision Making Amount and/or Complexity of Data Reviewed Labs: ordered. Radiology: ordered.  Risk Prescription drug management.   Uchenna L Staiger is here with dizziness.  History of hypertension, vertigo, diabetes, CAD, TAVR.  She is taking meclizine and Dramamine with some improvement.  Still having some unsteadiness with her gait.  Still having nausea and symptoms when she turns her head to the left.  Sounds like symptoms get better at rest.  Has had vertigo in the past feels similar but more intense with nausea.  Denies any other symptoms to suggest stroke including weakness numbness vision loss speech changes.  Her neurological exam is normal but she is unsteady on her feet.  I see no obvious nystagmus.  Symptoms started at 6 AM.  I have very low suspicion for stroke and I do think that this is a vertigo.  Will give her Valium and get a CTA of the head and neck and check basic labs and give her IV fluids and Zofran and reevaluate.  EKG shows sinus rhythm.  Overall per my review and interpretation labs no significant anemia or electrolyte abnormality or kidney injury.  Creatinine is at baseline.  CT scan of the head and  neck/CTA per radiology report with no acute findings.  She  does have some stenosis in several vessels but there is no large vessel occlusion.  No brain bleed or obvious mass.  Overall reevaluation her symptoms are greatly improved.  She is able to ambulate without any assistance.  She does feel like this is very similar to her vertigo episodes in the past.  Ultimately shared decision was made to discharge her home without get an MRI which was offered.  I think this is likely a peripheral vertigo she understands return precautions and will prescribe meclizine and refer her to ENT.  Recommend follow-up with primary care doctor.  Discharged in good condition.  This chart was dictated using voice recognition software.  Despite best efforts to proofread,  errors can occur which can change the documentation meaning.         Final Clinical Impression(s) / ED Diagnoses Final diagnoses:  Dizziness    Rx / DC Orders ED Discharge Orders          Ordered    meclizine (ANTIVERT) 25 MG tablet  3 times daily PRN        07/08/23 1936              Virgina Norfolk, DO 07/08/23 1941

## 2023-07-10 ENCOUNTER — Ambulatory Visit: Payer: Medicare HMO | Admitting: Internal Medicine

## 2023-07-13 ENCOUNTER — Encounter (HOSPITAL_COMMUNITY): Payer: Medicare HMO

## 2023-07-25 ENCOUNTER — Telehealth: Payer: Self-pay

## 2023-07-25 NOTE — Telephone Encounter (Signed)
Notes faxed to [facility] through epic on 07/25/23 at [phone]

## 2023-07-26 ENCOUNTER — Telehealth: Payer: Self-pay

## 2023-07-26 NOTE — Telephone Encounter (Signed)
Patient is rescheduled to Monday, 07/30/2023 at 11:10 AM with Dr. Lonzo Cloud.

## 2023-07-26 NOTE — Telephone Encounter (Signed)
Contact patient for sooner appt and place on cancellation list

## 2023-07-30 ENCOUNTER — Encounter: Payer: Self-pay | Admitting: Internal Medicine

## 2023-07-30 ENCOUNTER — Ambulatory Visit: Payer: Medicare HMO | Admitting: Internal Medicine

## 2023-07-30 VITALS — BP 136/80 | HR 84 | Ht 59.0 in | Wt 149.0 lb

## 2023-07-30 DIAGNOSIS — E1059 Type 1 diabetes mellitus with other circulatory complications: Secondary | ICD-10-CM | POA: Diagnosis not present

## 2023-07-30 LAB — POCT GLUCOSE (DEVICE FOR HOME USE)
POC Glucose: 56 mg/dL — AB (ref 70–99)
POC Glucose: 80 mg/dL (ref 70–99)

## 2023-07-30 LAB — POCT GLYCOSYLATED HEMOGLOBIN (HGB A1C): Hemoglobin A1C: 6.8 % — AB (ref 4.0–5.6)

## 2023-07-30 MED ORDER — TRESIBA FLEXTOUCH 100 UNIT/ML ~~LOC~~ SOPN: 22.0000 [IU] | PEN_INJECTOR | Freq: Every day | SUBCUTANEOUS | 4 refills | Status: DC

## 2023-07-30 MED ORDER — INSULIN PEN NEEDLE 32G X 4 MM MISC: 1.0000 | Freq: Four times a day (QID) | 3 refills | Status: DC

## 2023-07-30 MED ORDER — NOVOLOG FLEXPEN 100 UNIT/ML ~~LOC~~ SOPN: PEN_INJECTOR | SUBCUTANEOUS | 3 refills | Status: DC

## 2023-07-30 NOTE — Patient Instructions (Addendum)
-   Decrease Tresiba 22 units once daily  - Change Novolog 3 units with Breakfast, 3 units with Lunch and  5 units with Supper PLUS additional if needed based on the scale below  -Novolog correctional insulin: ADD extra units on insulin to your meal-time Novolog dose if your blood sugars are higher than 165. Use the scale below to help guide you:   Blood sugar before meal Number of units to inject  Less than 165 0 unit  166 -  200 1 units  201 - 235 2 units  236 - 270 3 units  271 - 305 4 units  306 - 340 5 units     HOW TO TREAT LOW BLOOD SUGARS (Blood sugar LESS THAN 70 MG/DL) Please follow the RULE OF 15 for the treatment of hypoglycemia treatment (when your (blood sugars are less than 70 mg/dL)   STEP 1: Take 15 grams of carbohydrates when your blood sugar is low, which includes:  3-4 GLUCOSE TABS  OR 3-4 OZ OF JUICE OR REGULAR SODA OR ONE TUBE OF GLUCOSE GEL    STEP 2: RECHECK blood sugar in 15 MINUTES STEP 3: If your blood sugar is still low at the 15 minute recheck --> then, go back to STEP 1 and treat AGAIN with another 15 grams of carbohydrates.

## 2023-07-30 NOTE — Progress Notes (Signed)
Name: Molly Hill  MRN/ DOB: [id number], Feb 07, 1951   Age/ Sex: 72 y.o., female    PCP: Thana Ates, MD   Reason for Endocrinology Evaluation: Type 2 Diabetes Mellitus     Date of Initial Endocrinology Visit: 02/15/2022    PATIENT IDENTIFIER: Molly Hill is a 72 y.o. female with a past medical history of T2DM, S/P TAVR, CAD, Hx of Breast ca (Dx 2016) . The patient presented for initial endocrinology clinic visit on 02/15/2022 for consultative assistance with her diabetes management.    HPI: [name] was   Diagnosed with DM at age 46  Hemoglobin A1c 7.8%     On her initial visit to uor clinic, her A1c was 8.0 %, she was on regular insulin and NPH. We switched her to insulin analogues and adjusted the doses , dexcom was faxed to DME  SUBJECTIVE:   During the last visit (06/26/2022): A1c 6.7%    Today (07/30/23): [name] is here for a follow up on diabetes management. She  checks her blood sugars multiple times daily. The patient has had hypoglycemic episodes since the last clinic visit. The patient is  symptomatic with these episodes.  She continues to follow-up with cardiology s/p TAVR and CAD/CHF   Denies nausea, vomiting   Denies constipation or diarrhea     HOME DIABETES REGIMEN: Tresiba 24 units daily  Novolog 4/4/6 units TIDQAC CF: NOvolog (BG -130/30)     Statin: yes ACE-I/ARB: yes    CONTINUOUS GLUCOSE MONITORING RECORD INTERPRETATION    Dates of Recording: 10/1-10/14/2024  Sensor description:dexcom G7  Results statistics:   CGM use % of time 93  Average and SD 154/55  Time in range  65  %  % Time Above 180 26  % Time above 250 5  % Time Below target 4   Glycemic patterns summary: BG's trend down overnight and fluctuate during the day  Hyperglycemic episodes postprandial  Hypoglycemic episodes occurred at night  Overnight periods: Trends down    DIABETIC COMPLICATIONS: Microvascular complications:  CKD III  Denies:  Retinopathy, neuropathy  Last eye exam: Completed 01/2022 ( Dr. Dione Booze)  Macrovascular complications:  CAD Denies:  PVD, CVA   PAST HISTORY: Past Medical History:  Past Medical History:  Diagnosis Date   Alopecia    Anemia    Arthritis    Bicuspid aortic valve    mild to moderate AS by echo 2022   Chronic diastolic (congestive) heart failure (HCC)    Coronary arteriosclerosis in native artery    s/p PCI of LAD in 2013 & NSTEMI with staged DES to pLAD (10/2021)   DM (diabetes mellitus), type 1 (HCC)    GERD (gastroesophageal reflux disease)    History of breast cancer    Estrogen receptor negative. s/p chemo   Hyperglycemia    Hyperlipidemia    Hypertension    Mitral regurgitation    mild to moderate by echo 2020   Myocardial infarction [facility]) 10/09/2021   S/P TAVR (transcatheter aortic valve replacement) 11/08/2021   s/p TAVR with a 23 mm Edwards S3UR via the TF approach by Dr. Excell Seltzer and Dr. Laneta Simmers   Tendinitis of left wrist    Past Surgical History:  Past Surgical History:  Procedure Laterality Date   ANKLE SURGERY Bilateral    CATARACT EXTRACTION, BILATERAL     CHOLECYSTECTOMY     CORONARY PRESSURE/FFR STUDY N/A 10/21/2021   Procedure: INTRAVASCULAR PRESSURE WIRE/FFR STUDY;  Surgeon: Tonny Bollman, MD;  Location: [facility] INVASIVE CV LAB;  Service: Cardiovascular;  Laterality: N/A;   CORONARY STENT INTERVENTION     CORONARY STENT INTERVENTION N/A 10/21/2021   Procedure: CORONARY STENT INTERVENTION;  Surgeon: Tonny Bollman, MD;  Location: [facility] INVASIVE CV LAB;  Service: Cardiovascular;  Laterality: N/A;   CORONARY/GRAFT ACUTE MI REVASCULARIZATION N/A 10/10/2021   Procedure: CORONARY/GRAFT ACUTE MI REVASCULARIZATION;  Surgeon: Runell Gess, MD;  Location: [facility] INVASIVE CV LAB;  Service: Cardiovascular;  Laterality: N/A;   CTR     ELBOW SURGERY     HAND SURGERY     BOTH THUMBS AND MIDDLE FINGERS   INTRAOPERATIVE TRANSTHORACIC ECHOCARDIOGRAM N/A 11/08/2021   Procedure:  INTRAOPERATIVE TRANSTHORACIC ECHOCARDIOGRAM;  Surgeon: Tonny Bollman, MD;  Location: [facility] INVASIVE CV LAB;  Service: Open Heart Surgery;  Laterality: N/A;   LEFT BREAST MASTECTOMY Bilateral    MASCULAR  REPAIR     PORT-A-CATH REMOVAL     PORTACATH PLACEMENT     RIGHT MODIFIED MASTECTOMY Right    TRANSCATHETER AORTIC VALVE REPLACEMENT, TRANSFEMORAL N/A 11/08/2021   Procedure: TRANSCATHETER AORTIC VALVE REPLACEMENT, TRANSFEMORAL;  Surgeon: Tonny Bollman, MD;  Location: [facility] INVASIVE CV LAB;  Service: Open Heart Surgery;  Laterality: N/A;   UPPER LIP SURGERY      Social History:  reports that she quit smoking about 43 years ago. Her smoking use included cigarettes. She started smoking about 46 years ago. She has never used smokeless tobacco. She reports that she does not drink alcohol and does not use drugs. Family History:  Family History  Problem Relation Age of Onset   Heart attack Mother    CAD Father        CABG   Diabetes Father    Stroke Father    Colon cancer Father        10s   Diabetes Brother    Diabetes Paternal Grandfather    Heart attack Paternal Grandfather    Breast cancer Maternal Aunt        19s     HOME MEDICATIONS: Allergies as of 07/30/2023       Reactions   Codeine    Hyper with vomiting   Onion Diarrhea, Nausea And Vomiting, Other (See Comments)   syncope   Reglan [metoclopramide]    "pulls muscle to side" "feel paralyzed"        Medication List        Accurate as of July 30, 2023 11:23 AM. If you have any questions, ask your nurse or doctor.          acetaminophen 500 MG tablet Commonly known as: TYLENOL Take 1,000 mg by mouth every 6 (six) hours as needed for moderate pain or headache.   amLODipine 2.5 MG tablet Commonly known as: NORVASC TAKE 1 TABLET BY MOUTH EVERY DAY   amoxicillin 500 MG capsule Commonly known as: AMOXIL Take 4 capsules by mouth 1 hour prior to dental appointments   ascorbic acid 500 MG tablet Commonly  known as: VITAMIN C Take 500 mg by mouth daily.   aspirin EC 81 MG tablet Take 81 mg by mouth at bedtime.   atorvastatin 80 MG tablet Commonly known as: LIPITOR TAKE 1 TABLET BY MOUTH EVERYDAY AT BEDTIME   BIOTIN PO Take by mouth daily.   bisacodyl 5 MG EC tablet Generic drug: bisacodyl Take 2 tablets (10 mg total) by mouth daily as needed for moderate constipation.   CALCIUM 600 + D PO Take 1 tablet by mouth daily.   cholecalciferol  25 MCG (1000 UNIT) tablet Commonly known as: VITAMIN D3 Take 1,000 Units by mouth daily.   cyanocobalamin 1000 MCG tablet Commonly known as: VITAMIN B12 Take 1,000 mcg by mouth daily.   Dexcom G7 Sensor Misc 1 Device by Does not apply route every 30 (thirty) days.   docusate sodium 100 MG capsule Commonly known as: COLACE Take 2 capsules (200 mg total) by mouth daily.   erythromycin ophthalmic ointment Place a 1/2 inch ribbon of ointment into the right lower eyelid 3 times daily for 7 days.   EYE ALLERGY RELIEF OP Place 1 drop into both eyes daily as needed (allergies).   fexofenadine 180 MG tablet Commonly known as: ALLEGRA Take 180 mg by mouth daily as needed for allergies.   fluticasone 50 MCG/ACT nasal spray Commonly known as: FLONASE Place 1 spray into both nostrils as needed for allergies.   furosemide 40 MG tablet Commonly known as: LASIX Take 40 mg by mouth daily.   glucose blood test strip 1 each by Other route 4 (four) times daily -  before meals and at bedtime. Use as instructed   HAIR/SKIN/NAILS PO Take 1 capsule by mouth daily.   Insulin Pen Needle 32G X 4 MM Misc 1 Device by Does not apply route in the morning, at noon, in the evening, and at bedtime.   loperamide 2 MG tablet Commonly known as: IMODIUM A-D Take 1-2 mg by mouth 4 (four) times daily as needed for diarrhea or loose stools.   losartan 25 MG tablet Commonly known as: COZAAR Take 1 tablet (25 mg total) by mouth daily.   meclizine 25 MG  tablet Commonly known as: ANTIVERT Take 1 tablet (25 mg total) by mouth 3 (three) times daily as needed for dizziness.   Melatonin 12 MG Tabs Take 1 tablet by mouth daily. 1 tab at bedtime   metoprolol succinate 25 MG 24 hr tablet Commonly known as: Toprol XL Take 1 tablet (25 mg total) by mouth daily.   nitroGLYCERIN 0.4 MG SL tablet Commonly known as: NITROSTAT Place 1 tablet (0.4 mg total) under the tongue every 5 (five) minutes as needed for chest pain.   NovoLOG FlexPen 100 UNIT/ML FlexPen Generic drug: insulin aspart Max daily 45 units per scale   Tresiba FlexTouch 100 UNIT/ML FlexTouch Pen Generic drug: insulin degludec Inject 32 Units into the skin daily. What changed: how much to take   Zinc 50 MG Caps Take 50 mg by mouth daily.         ALLERGIES: Allergies  Allergen Reactions   Codeine     Hyper with vomiting   Onion Diarrhea, Nausea And Vomiting and Other (See Comments)    syncope   Reglan [Metoclopramide]     "pulls muscle to side" "feel paralyzed"      OBJECTIVE:   VITAL SIGNS: BP 136/80 (BP Location: Left Arm, Patient Position: Sitting, Cuff Size: Large)   Pulse 84   Ht 4\' 11"  (1.499 m)   Wt 149 lb (67.6 kg)   SpO2 99%   BMI 30.09 kg/m    PHYSICAL EXAM:  General: Pt appears well and is in NAD  Lungs: Clear with good BS bilat with no rales, rhonchi, or wheezes  Heart: RRR   Extremities:  Lower extremities - trace pretibial edema.   Neuro: MS is good with appropriate affect, pt is alert and Ox3    DM foot exam: 12/26/2022  The skin of the feet is intact without sores or ulcerations. The pedal  pulses are 2+ on right and 2+ on left. The sensation is intact to a screening 5.07, 10 gram monofilament bilaterally   DATA REVIEWED:  Lab Results  Component Value Date   HGBA1C 6.7 (A) 12/26/2022   HGBA1C 6.9 (A) 06/26/2022   HGBA1C 8.0 (A) 02/15/2022     Latest Reference Range & Units 07/08/23 17:12  Sodium 135 - 145 mmol/L 140   Potassium 3.5 - 5.1 mmol/L 4.0  Chloride 98 - 111 mmol/L 100  CO2 22 - 32 mmol/L 32  Glucose 70 - 99 mg/dL 756 (H)  BUN 8 - 23 mg/dL 24 (H)  Creatinine 4.33 - 1.00 mg/dL 2.95 (H)  Calcium 8.9 - 10.3 mg/dL 18.8 (H)  Anion gap 5 - 15  8  GFR, Estimated >60 mL/min 48 (L)     ASSESSMENT / PLAN / RECOMMENDATIONS:   1) Type 1 Diabetes Mellitus, Optimally controlled, With CKD III and Macrovascular  complications - Most recent A1c of 6.8 %. Goal A1c < 7.0 %.    - A1c continues to improve  -She has been noted with an in office BG of 56 Mg/DL, the patient did eat breakfast this morning and took 4 units of NovoLog. -In reviewing her CGM download, patient has been noted with hypoglycemia overnight as well as throughout the day -I will decrease her basal rate as well as NovoLog as below -I will also adjust her sensitivity factor from 30 to 35   MEDICATIONS:  Decrease Tresiba 22 units daily Decrease NovoLog 3 units before Breakfast, 3 units before Lunch and 5 units before supper  Change Correction factor: NovoLog (BG -130/35)   EDUCATION / INSTRUCTIONS: BG monitoring instructions: Patient is instructed to check her blood sugars 3 times a day, before meals. Call [facility] if: BG persistently < 70  I reviewed the Rule of 15 for the treatment of hypoglycemia in detail with the patient. Literature supplied.   2) Diabetic complications:  Eye: Does not have known diabetic retinopathy.  Neuro/ Feet: Does not have known diabetic peripheral neuropathy. Renal: Patient does  have known baseline CKD. She is  on an ACEI/ARB at present.   Follow-up in 4 months     Signed electronically by: Lyndle Herrlich, MD  [facility] Endocrinology  [facility] [address] [place], [place] [address] Phone: [phone] FAX: [phone]   CC: Thana Ates, MD [address] [place] [address] Phone: [phone]  Fax:  [phone]    Return to Endocrinology clinic as below: Future Appointments  Date Time Provider Department Center  01/02/2024 10:15 AM [facility] None  01/02/2024 10:30 AM Earna Coder, MD [facility] None

## 2023-08-02 ENCOUNTER — Other Ambulatory Visit (HOSPITAL_BASED_OUTPATIENT_CLINIC_OR_DEPARTMENT_OTHER): Payer: Self-pay | Admitting: Otolaryngology

## 2023-08-02 DIAGNOSIS — I6523 Occlusion and stenosis of bilateral carotid arteries: Secondary | ICD-10-CM

## 2023-08-03 ENCOUNTER — Other Ambulatory Visit: Payer: Self-pay

## 2023-08-03 MED ORDER — ACCU-CHEK AVIVA PLUS VI STRP: ORAL_STRIP | 2 refills | Status: AC

## 2023-08-13 ENCOUNTER — Ambulatory Visit: Payer: Medicare HMO

## 2023-08-15 ENCOUNTER — Ambulatory Visit: Payer: Medicare HMO | Attending: Otolaryngology

## 2023-08-15 ENCOUNTER — Other Ambulatory Visit: Payer: Self-pay

## 2023-08-15 DIAGNOSIS — R2681 Unsteadiness on feet: Secondary | ICD-10-CM | POA: Diagnosis present

## 2023-08-15 DIAGNOSIS — R42 Dizziness and giddiness: Secondary | ICD-10-CM

## 2023-08-15 NOTE — Therapy (Signed)
OUTPATIENT PHYSICAL THERAPY VESTIBULAR EVALUATION     Patient Name: Molly Hill MRN: [id number] DOB:1950-10-24, 72 y.o., female Today's Date: 08/15/2023  END OF SESSION:  PT End of Session - 08/15/23 0810     Visit Number 1    Number of Visits 7    Date for PT Re-Evaluation 09/26/23    Authorization Type [employer]    Progress Note Due on Visit 10    PT Start Time 0807    PT Stop Time 0845    PT Time Calculation (min) 38 min             Past Medical History:  Diagnosis Date   Alopecia    Anemia    Arthritis    Bicuspid aortic valve    mild to moderate AS by echo 2022   Chronic diastolic (congestive) heart failure (HCC)    Coronary arteriosclerosis in native artery    s/p PCI of LAD in 2013 & NSTEMI with staged DES to pLAD (10/2021)   DM (diabetes mellitus), type 1 (HCC)    GERD (gastroesophageal reflux disease)    History of breast cancer    Estrogen receptor negative. s/p chemo   Hyperglycemia    Hyperlipidemia    Hypertension    Mitral regurgitation    mild to moderate by echo 2020   Myocardial infarction [facility]) 10/09/2021   S/P TAVR (transcatheter aortic valve replacement) 11/08/2021   s/p TAVR with a 23 mm Edwards S3UR via the TF approach by Dr. Excell Seltzer and Dr. Laneta Simmers   Tendinitis of left wrist    Past Surgical History:  Procedure Laterality Date   ANKLE SURGERY Bilateral    CATARACT EXTRACTION, BILATERAL     CHOLECYSTECTOMY     CORONARY PRESSURE/FFR STUDY N/A 10/21/2021   Procedure: INTRAVASCULAR PRESSURE WIRE/FFR STUDY;  Surgeon: Tonny Bollman, MD;  Location: [facility] INVASIVE CV LAB;  Service: Cardiovascular;  Laterality: N/A;   CORONARY STENT INTERVENTION     CORONARY STENT INTERVENTION N/A 10/21/2021   Procedure: CORONARY STENT INTERVENTION;  Surgeon: Tonny Bollman, MD;  Location: [facility] INVASIVE CV LAB;  Service: Cardiovascular;  Laterality: N/A;   CORONARY/GRAFT ACUTE MI REVASCULARIZATION N/A 10/10/2021   Procedure: CORONARY/GRAFT ACUTE MI  REVASCULARIZATION;  Surgeon: Runell Gess, MD;  Location: [facility] INVASIVE CV LAB;  Service: Cardiovascular;  Laterality: N/A;   CTR     ELBOW SURGERY     HAND SURGERY     BOTH THUMBS AND MIDDLE FINGERS   INTRAOPERATIVE TRANSTHORACIC ECHOCARDIOGRAM N/A 11/08/2021   Procedure: INTRAOPERATIVE TRANSTHORACIC ECHOCARDIOGRAM;  Surgeon: Tonny Bollman, MD;  Location: [facility] INVASIVE CV LAB;  Service: Open Heart Surgery;  Laterality: N/A;   LEFT BREAST MASTECTOMY Bilateral    MASCULAR  REPAIR     PORT-A-CATH REMOVAL     PORTACATH PLACEMENT     RIGHT MODIFIED MASTECTOMY Right    TRANSCATHETER AORTIC VALVE REPLACEMENT, TRANSFEMORAL N/A 11/08/2021   Procedure: TRANSCATHETER AORTIC VALVE REPLACEMENT, TRANSFEMORAL;  Surgeon: Tonny Bollman, MD;  Location: [facility] INVASIVE CV LAB;  Service: Open Heart Surgery;  Laterality: N/A;   UPPER LIP SURGERY     Patient Active Problem List   Diagnosis Date Noted   Heart failure (HCC) 09/20/2022   Type 1 diabetes mellitus with stage 3a chronic kidney disease (HCC) 02/15/2022   Type 1 diabetes mellitus with hyperglycemia (HCC) 02/15/2022   Dyslipidemia 02/15/2022   History of breast cancer 11/08/2021   GERD (gastroesophageal reflux disease) 11/08/2021   DM (diabetes mellitus), type 1 (HCC) 11/08/2021  Chronic diastolic (congestive) heart failure (HCC) 11/08/2021   S/P TAVR (transcatheter aortic valve replacement) 11/08/2021   Severe aortic stenosis 10/22/2021   Carcinoma of overlapping sites of right breast in female, estrogen receptor negative (HCC) 12/24/2019   Mitral regurgitation    Coronary arteriosclerosis in native artery 2013    PCP: Thana Ates, MD REFERRING PROVIDER: Graylin Shiver, MD  REFERRING DIAG: R42 (ICD-10-CM) - Dizziness and giddiness R26.89 (ICD-10-CM) - Other abnormalities of gait and mobility  THERAPY DIAG:  Dizziness and giddiness  Unsteadiness on feet  ONSET DATE: 07/08/23  Rationale for Evaluation and Treatment:  Rehabilitation  SUBJECTIVE:   SUBJECTIVE STATEMENT: Dizzy/spinning with laying down, getting up, and looking up.  Notes hx of vertigo but this episode has been more intense with N&V. Meclazine 3x/day, symptoms have improved with Meclazine. No illness preceding this episode. Denies photo/phonophobia, no tinnitus. Notes neck pain has been present since onset of vertigo R>L Pt accompanied by: self  PERTINENT HISTORY: 72 y.o. female is seen for evaluation of vertigo. She reports this has occurred mostly when lying back in bed. Worse with looking up, rolling over on the right.  Denies hearing changes. Reports poor balance. Vertigo lasting for seconds to minutes at a time. Was seen in ED end of September.  PAIN:  Are you having pain?  Neck has been stiff/sore  PRECAUTIONS: None  RED FLAGS: None   WEIGHT BEARING RESTRICTIONS: No  FALLS: Has patient fallen in last 6 months? No  LIVING ENVIRONMENT: Lives with: lives alone Lives in: House/apartment Stairs:  Has following equipment at home: None  PLOF: Independent  PATIENT GOALS: eliminate symptoms  OBJECTIVE:  Note: Objective measures were completed at Evaluation unless otherwise noted.  DIAGNOSTIC FINDINGS: pt reports MRI has been scheduled  COGNITION: Overall cognitive status: Within functional limits for tasks assessed   SENSATION: Not tested  EDEMA:  none  MUSCLE TONE:  NT  DTRs:  NT  POSTURE:  No Significant postural limitations  Cervical ROM:    Active A/PROM (deg) eval  Flexion WFL  Extension WFL  Right lateral flexion WFL  Left lateral flexion stiffness  Right rotation stiffness  Left rotation WFL  (Blank rows = not tested)  STRENGTH: NT   BED MOBILITY:  indep  TRANSFERS: Independent  RAMP: NT  CURB: NT  GAIT: Gait pattern: WFL Distance walked:  Assistive device utilized: None Level of assistance: Complete Independence Comments:   FUNCTIONAL TESTS:  Functional gait assessment:  TBD M-CTSIB: TBD  PATIENT SURVEYS:  FOTO 47  VESTIBULAR ASSESSMENT:  GENERAL OBSERVATION:    SYMPTOM BEHAVIOR:  Subjective history: wears progressive lens with prisms due to diplopia  Non-Vestibular symptoms: neck pain and has hx of diplopia  Type of dizziness: Imbalance (Disequilibrium), Oscillopsia, Spinning/Vertigo, and Unsteady with head/body turns  Frequency: daily with position changes, head movements  Duration: seconds-minutes, lingering symptoms  Aggravating factors: Induced by position change: lying supine, rolling to the right, and rolling to the left, Induced by motion: looking up at the ceiling, turning body quickly, and turning head quickly, Worse in the dark, and Worse outside or in busy environment  Relieving factors: head stationary, closing eyes, slow movements, and avoid busy/distracting environments  Progression of symptoms: unchanged  OCULOMOTOR EXAM:  Ocular Alignment: normal  Ocular ROM: No Limitations  Spontaneous Nystagmus: absent  Gaze-Induced Nystagmus: age appropriate nystagmus at end range  Smooth Pursuits: intact  Saccades: intact  Convergence/Divergence: 10 cm    -has baseline diplopia   VESTIBULAR -  OCULAR REFLEX:   Slow VOR: Positive Bilaterally, horizontal > vertical, noted some extraneous eye movements with horizontal (saccades?)  VOR Cancellation: Comment: some instances of saccades? Difficult to distinguish if it was trouble with coordination  Head-Impulse Test: HIT Right: positive HIT Left: positive----very prominent and delayed  Dynamic Visual Acuity: Not able to be assessed   POSITIONAL TESTING: Right Dix-Hallpike: upbeating, right nystagmus Left Dix-Hallpike: no nystagmus Right Roll Test: no nystagmus Left Roll Test: no nystagmus -motion sensitive with roll test. Possibly left downbeating in position 2 during right Epley  MOTION SENSITIVITY:  Motion Sensitivity Quotient Intensity: 0 = none, 1 = Lightheaded, 2 = Mild, 3 = Moderate,  4 = Severe, 5 = Vomiting  Intensity  1. Sitting to supine   2. Supine to L side   3. Supine to R side   4. Supine to sitting   5. L Hallpike-Dix   6. Up from L    7. R Hallpike-Dix   8. Up from R    9. Sitting, head tipped to L knee   10. Head up from L knee   11. Sitting, head tipped to R knee   12. Head up from R knee   13. Sitting head turns x5   14.Sitting head nods x5   15. In stance, 180 turn to L    16. In stance, 180 turn to R     OTHOSTATICS: not done     VESTIBULAR TREATMENT:                                                                                                   DATE: 08/15/23  Canalith Repositioning:  Epley Right: Number of Reps: 2 Decreased amplitude and severity of symptoms with follow-up Dix-Hallpike and 2nd Epley Gaze Adaptation:  TBD Habituation:  TBD Other:   PATIENT EDUCATION: Education details: assessment details, rationale of intervention Person educated: Patient Education method: Explanation Education comprehension: verbalized understanding and needs further education  HOME EXERCISE PROGRAM: TBD  GOALS: Goals reviewed with patient? Yes  SHORT TERM GOALS: Target date: 08/29/2023    Patient will be independent in HEP to improve functional outcomes Baseline: Goal status: INITIAL  2.  Report/demonstrate absent positional vertigo to improve comfort, balance, and safety to reduce risk for falls Baseline: +right Dix-Hallpike Goal status: INITIAL    LONG TERM GOALS: Target date: 09/26/2023    Meet predicted FOTO outcomes (61) Baseline: 47 Goal status: INITIAL  2.  Demonstrate low risk for falls per score 25/30 Functional Gait Assessment Baseline: TBD Goal status: INITIAL  3.  Demo improved postural stability per mild sway condition 4 M-CTSIB to improve safety in ADL Baseline: TBD Goal status: INITIAL    ASSESSMENT:  CLINICAL IMPRESSION: Patient is a 72 y.o. lady who was seen today for physical therapy evaluation  and treatment for dizziness and unsteadiness.  Demonstrates abnormalities with vestibular assessment including symptomatic VOR testing and prominently positive Head Impulse Test left > right.  Positional testing yields motion sensitivity with Roll test bilaterally and right, upbeating nystagmus in right Dix-Hallpike position.  Mobility and daily activities  impacted as evident by score on FOTO survey.  Pt would benefit from PT intervention to improve symptoms and improve mobility and reduce risk for falls   OBJECTIVE IMPAIRMENTS: decreased activity tolerance, decreased balance, dizziness, hypomobility, and pain.   ACTIVITY LIMITATIONS: standing, sleeping, transfers, bed mobility, reach over head, and locomotion level  PARTICIPATION LIMITATIONS: meal prep, cleaning, shopping, community activity, and yard work  PERSONAL FACTORS: Age, Time since onset of injury/illness/exacerbation, and 1-2 comorbidities: DM, HTN  are also affecting patient's functional outcome.   REHAB POTENTIAL: Excellent  CLINICAL DECISION MAKING: Evolving/moderate complexity  EVALUATION COMPLEXITY: Moderate   PLAN:  PT FREQUENCY: 1-2x/week  PT DURATION: 6 weeks  PLANNED INTERVENTIONS: 97110-Therapeutic exercises, 97530- Therapeutic activity, [id number]- Neuromuscular re-education, 97535- Self Care, 16109- Manual therapy, [phone]- Gait training, [phone]- Canalith repositioning, [phone]- Aquatic Therapy, Patient/Family education, Balance training, Stair training, Taping, Dry Needling, Joint mobilization, Spinal mobilization, Vestibular training, DME instructions, Cryotherapy, and Moist heat  PLAN FOR NEXT SESSION: address positional dizziness, FGA, M-CTSIB, initiate HEP for VOR and balance accordingly   10:35 AM, 08/15/23 M. Shary Decamp, PT, DPT Physical Therapist- [place] Office Number: [phone]   Referring diagnosis? R42 (ICD-10-CM) - Dizziness and giddiness R26.89 (ICD-10-CM) - Other abnormalities of gait and  mobility Treatment diagnosis? (if different than referring diagnosis) R42, R26.81 What was this (referring dx) caused by? []  Surgery []  Fall [x]  Ongoing issue []  Arthritis [x]  Other: _vertigo___________  Laterality: []  Rt []  Lt [x]  Both  Check all possible CPT codes:  *CHOOSE 10 OR LESS*    See Planned Interventions listed in the Plan section of the Evaluation.

## 2023-08-17 ENCOUNTER — Ambulatory Visit: Payer: Medicare HMO | Attending: Otolaryngology

## 2023-08-17 DIAGNOSIS — R2681 Unsteadiness on feet: Secondary | ICD-10-CM | POA: Diagnosis present

## 2023-08-17 DIAGNOSIS — R42 Dizziness and giddiness: Secondary | ICD-10-CM | POA: Diagnosis present

## 2023-08-17 NOTE — Therapy (Signed)
OUTPATIENT PHYSICAL THERAPY VESTIBULAR TREATMENT     Patient Name: Molly Hill MRN: [id number] DOB:04-10-1951, 72 y.o., female Today's Date: 08/17/2023  END OF SESSION:  PT End of Session - 08/17/23 0850     Visit Number 2    Number of Visits 7    Date for PT Re-Evaluation 09/26/23    Authorization Type [employer]    Progress Note Due on Visit 10    PT Start Time 0845    PT Stop Time 0930    PT Time Calculation (min) 45 min             Past Medical History:  Diagnosis Date   Alopecia    Anemia    Arthritis    Bicuspid aortic valve    mild to moderate AS by echo 2022   Chronic diastolic (congestive) heart failure (HCC)    Coronary arteriosclerosis in native artery    s/p PCI of LAD in 2013 & NSTEMI with staged DES to pLAD (10/2021)   DM (diabetes mellitus), type 1 (HCC)    GERD (gastroesophageal reflux disease)    History of breast cancer    Estrogen receptor negative. s/p chemo   Hyperglycemia    Hyperlipidemia    Hypertension    Mitral regurgitation    mild to moderate by echo 2020   Myocardial infarction [facility]) 10/09/2021   S/P TAVR (transcatheter aortic valve replacement) 11/08/2021   s/p TAVR with a 23 mm Edwards S3UR via the TF approach by Dr. Excell Seltzer and Dr. Laneta Simmers   Tendinitis of left wrist    Past Surgical History:  Procedure Laterality Date   ANKLE SURGERY Bilateral    CATARACT EXTRACTION, BILATERAL     CHOLECYSTECTOMY     CORONARY PRESSURE/FFR STUDY N/A 10/21/2021   Procedure: INTRAVASCULAR PRESSURE WIRE/FFR STUDY;  Surgeon: Tonny Bollman, MD;  Location: [facility] INVASIVE CV LAB;  Service: Cardiovascular;  Laterality: N/A;   CORONARY STENT INTERVENTION     CORONARY STENT INTERVENTION N/A 10/21/2021   Procedure: CORONARY STENT INTERVENTION;  Surgeon: Tonny Bollman, MD;  Location: [facility] INVASIVE CV LAB;  Service: Cardiovascular;  Laterality: N/A;   CORONARY/GRAFT ACUTE MI REVASCULARIZATION N/A 10/10/2021   Procedure: CORONARY/GRAFT ACUTE MI  REVASCULARIZATION;  Surgeon: Runell Gess, MD;  Location: [facility] INVASIVE CV LAB;  Service: Cardiovascular;  Laterality: N/A;   CTR     ELBOW SURGERY     HAND SURGERY     BOTH THUMBS AND MIDDLE FINGERS   INTRAOPERATIVE TRANSTHORACIC ECHOCARDIOGRAM N/A 11/08/2021   Procedure: INTRAOPERATIVE TRANSTHORACIC ECHOCARDIOGRAM;  Surgeon: Tonny Bollman, MD;  Location: [facility] INVASIVE CV LAB;  Service: Open Heart Surgery;  Laterality: N/A;   LEFT BREAST MASTECTOMY Bilateral    MASCULAR  REPAIR     PORT-A-CATH REMOVAL     PORTACATH PLACEMENT     RIGHT MODIFIED MASTECTOMY Right    TRANSCATHETER AORTIC VALVE REPLACEMENT, TRANSFEMORAL N/A 11/08/2021   Procedure: TRANSCATHETER AORTIC VALVE REPLACEMENT, TRANSFEMORAL;  Surgeon: Tonny Bollman, MD;  Location: [facility] INVASIVE CV LAB;  Service: Open Heart Surgery;  Laterality: N/A;   UPPER LIP SURGERY     Patient Active Problem List   Diagnosis Date Noted   Heart failure (HCC) 09/20/2022   Type 1 diabetes mellitus with stage 3a chronic kidney disease (HCC) 02/15/2022   Type 1 diabetes mellitus with hyperglycemia (HCC) 02/15/2022   Dyslipidemia 02/15/2022   History of breast cancer 11/08/2021   GERD (gastroesophageal reflux disease) 11/08/2021   DM (diabetes mellitus), type 1 (HCC) 11/08/2021  Chronic diastolic (congestive) heart failure (HCC) 11/08/2021   S/P TAVR (transcatheter aortic valve replacement) 11/08/2021   Severe aortic stenosis 10/22/2021   Carcinoma of overlapping sites of right breast in female, estrogen receptor negative (HCC) 12/24/2019   Mitral regurgitation    Coronary arteriosclerosis in native artery 2013    PCP: Thana Ates, MD REFERRING PROVIDER: Graylin Shiver, MD  REFERRING DIAG: R42 (ICD-10-CM) - Dizziness and giddiness R26.89 (ICD-10-CM) - Other abnormalities of gait and mobility  THERAPY DIAG:  Dizziness and giddiness  Unsteadiness on feet  ONSET DATE: 07/08/23  Rationale for Evaluation and Treatment:  Rehabilitation  SUBJECTIVE:   SUBJECTIVE STATEMENT: Doing ok, not too bad with bed mobility today Pt accompanied by: self  PERTINENT HISTORY: 72 y.o. female is seen for evaluation of vertigo. She reports this has occurred mostly when lying back in bed. Worse with looking up, rolling over on the right.  Denies hearing changes. Reports poor balance. Vertigo lasting for seconds to minutes at a time. Was seen in ED end of September.  PAIN:  Are you having pain?  Neck has been stiff/sore  PRECAUTIONS: None  RED FLAGS: None   WEIGHT BEARING RESTRICTIONS: No  FALLS: Has patient fallen in last 6 months? No  LIVING ENVIRONMENT: Lives with: lives alone Lives in: House/apartment Stairs:  Has following equipment at home: None  PLOF: Independent  PATIENT GOALS: eliminate symptoms  OBJECTIVE:   TODAY'S TREATMENT: 08/17/23 Activity Comments  M-CTSIB Condition 1: normal Condition 2: mild Condition 3: normal Condition 4:moderate-severe  Functional Gait Assessment 26/30  R DH Right upbeating nystagmus ~15 sec  R Epley   R DH Small amplitude right upbeating nystagmus > 1 min. Cupulo?  R Epley   Standing VOR x 1  X 30 sec at 120 bpm, no symptoms reported, good stability, cues for speed and amplitude  Explanation/demo of corner balance Feet together EO/EC 3x30 sec    PATIENT EDUCATION: Education details: assessment details, rationale of intervention Person educated: Patient Education method: Explanation Education comprehension: verbalized understanding and needs further education  HOME EXERCISE PROGRAM: Access Code: [email or web address] URL: [email or web address] Date: 08/17/2023 Prepared by: Shary Decamp  Exercises - Standing Gaze Stabilization with Head Rotation  - 1 x daily - 7 x weekly - 3-5 sets - 30 sec hold - Corner Balance Feet Together With Eyes Open  - 1 x daily - 7 x weekly - 3 sets - 30 sec hold - Corner Balance Feet Together With Eyes Closed  - 1 x daily - 7 x  weekly - 3 sets - 30 sec hold  Note: Objective measures were completed at Evaluation unless otherwise noted.  DIAGNOSTIC FINDINGS: pt reports MRI has been scheduled  COGNITION: Overall cognitive status: Within functional limits for tasks assessed   SENSATION: Not tested  EDEMA:  none  MUSCLE TONE:  NT  DTRs:  NT  POSTURE:  No Significant postural limitations  Cervical ROM:    Active A/PROM (deg) eval  Flexion WFL  Extension WFL  Right lateral flexion WFL  Left lateral flexion stiffness  Right rotation stiffness  Left rotation WFL  (Blank rows = not tested)  STRENGTH: NT   BED MOBILITY:  indep  TRANSFERS: Independent  RAMP: NT  CURB: NT  GAIT: Gait pattern: WFL Distance walked:  Assistive device utilized: None Level of assistance: Complete Independence Comments:   FUNCTIONAL TESTS:  Functional gait assessment: 26/30 M-CTSIB: TBD  PATIENT SURVEYS:  FOTO 47  VESTIBULAR ASSESSMENT:  GENERAL OBSERVATION:  SYMPTOM BEHAVIOR:  Subjective history: wears progressive lens with prisms due to diplopia  Non-Vestibular symptoms: neck pain and has hx of diplopia  Type of dizziness: Imbalance (Disequilibrium), Oscillopsia, Spinning/Vertigo, and Unsteady with head/body turns  Frequency: daily with position changes, head movements  Duration: seconds-minutes, lingering symptoms  Aggravating factors: Induced by position change: lying supine, rolling to the right, and rolling to the left, Induced by motion: looking up at the ceiling, turning body quickly, and turning head quickly, Worse in the dark, and Worse outside or in busy environment  Relieving factors: head stationary, closing eyes, slow movements, and avoid busy/distracting environments  Progression of symptoms: unchanged  OCULOMOTOR EXAM:  Ocular Alignment: normal  Ocular ROM: No Limitations  Spontaneous Nystagmus: absent  Gaze-Induced Nystagmus: age appropriate nystagmus at end range  Smooth  Pursuits: intact  Saccades: intact  Convergence/Divergence: 10 cm    -has baseline diplopia   VESTIBULAR - OCULAR REFLEX:   Slow VOR: Positive Bilaterally, horizontal > vertical, noted some extraneous eye movements with horizontal (saccades?)  VOR Cancellation: Comment: some instances of saccades? Difficult to distinguish if it was trouble with coordination  Head-Impulse Test: HIT Right: positive HIT Left: positive----very prominent and delayed  Dynamic Visual Acuity: Not able to be assessed   POSITIONAL TESTING: Right Dix-Hallpike: upbeating, right nystagmus Left Dix-Hallpike: no nystagmus Right Roll Test: no nystagmus Left Roll Test: no nystagmus -motion sensitive with roll test. Possibly left downbeating in position 2 during right Epley  MOTION SENSITIVITY:  Motion Sensitivity Quotient Intensity: 0 = none, 1 = Lightheaded, 2 = Mild, 3 = Moderate, 4 = Severe, 5 = Vomiting  Intensity  1. Sitting to supine   2. Supine to L side   3. Supine to R side   4. Supine to sitting   5. L Hallpike-Dix   6. Up from L    7. R Hallpike-Dix   8. Up from R    9. Sitting, head tipped to L knee   10. Head up from L knee   11. Sitting, head tipped to R knee   12. Head up from R knee   13. Sitting head turns x5   14.Sitting head nods x5   15. In stance, 180 turn to L    16. In stance, 180 turn to R     OTHOSTATICS: not done      GOALS: Goals reviewed with patient? Yes  SHORT TERM GOALS: Target date: 08/29/2023    Patient will be independent in HEP to improve functional outcomes Baseline: Goal status: INITIAL  2.  Report/demonstrate absent positional vertigo to improve comfort, balance, and safety to reduce risk for falls Baseline: +right Dix-Hallpike Goal status: INITIAL    LONG TERM GOALS: Target date: 09/26/2023    Meet predicted FOTO outcomes (61) Baseline: 47 Goal status: INITIAL  2.  Demonstrate low risk for falls per score 25/30 Functional Gait  Assessment Baseline: 26/30 Goal status: MET  3.  Demo improved postural stability per mild sway condition 4 M-CTSIB to improve safety in ADL Baseline: TBD Goal status: INITIAL    ASSESSMENT:  CLINICAL IMPRESSION: Demonstrates marked unsteadiness with condition 2 and 4 of M-CTSIB implying decreased input of vestibular system in postural stability.  Functional Gait Assessment with good results of 26/30 indicating low risk for falls. Continued with right Dix-Hallpike initially demonstrating right upbeating nystagmus of approx 15 sec with small amplitude lingering. Proceeded with right Epley and re-test of Dix-Hallpike with lingering small amplitude right upbeating > 1 min.  Repeat of right Epley, may need to consider trial of Semont or other liberatory maneuver.  Initiated HEP with standing VOR and use of metronome and cues for adequate speed and amplitude with improved carryover/return demonstration. Corner balance activity to improve balance and postural stability/awareness. Continued sessions to advance POC details.   OBJECTIVE IMPAIRMENTS: decreased activity tolerance, decreased balance, dizziness, hypomobility, and pain.   ACTIVITY LIMITATIONS: standing, sleeping, transfers, bed mobility, reach over head, and locomotion level  PARTICIPATION LIMITATIONS: meal prep, cleaning, shopping, community activity, and yard work  PERSONAL FACTORS: Age, Time since onset of injury/illness/exacerbation, and 1-2 comorbidities: DM, HTN  are also affecting patient's functional outcome.   REHAB POTENTIAL: Excellent  CLINICAL DECISION MAKING: Evolving/moderate complexity  EVALUATION COMPLEXITY: Moderate   PLAN:  PT FREQUENCY: 1-2x/week  PT DURATION: 6 weeks  PLANNED INTERVENTIONS: 97110-Therapeutic exercises, 97530- Therapeutic activity, [id number]- Neuromuscular re-education, 97535- Self Care, [id number]- Manual therapy, [phone]- Gait training, [phone]- Canalith repositioning, [phone]- Aquatic Therapy,  Patient/Family education, Balance training, Stair training, Taping, Dry Needling, Joint mobilization, Spinal mobilization, Vestibular training, DME instructions, Cryotherapy, and Moist heat  PLAN FOR NEXT SESSION: Semont maneuver? Progress VOR and corner balance activities   8:51 AM, 08/17/23 M. Shary Decamp, PT, DPT Physical Therapist- [place] Office Number: [phone]

## 2023-08-21 ENCOUNTER — Other Ambulatory Visit: Payer: Self-pay | Admitting: *Deleted

## 2023-08-21 ENCOUNTER — Ambulatory Visit: Payer: Medicare HMO

## 2023-08-21 DIAGNOSIS — R42 Dizziness and giddiness: Secondary | ICD-10-CM

## 2023-08-21 DIAGNOSIS — I6529 Occlusion and stenosis of unspecified carotid artery: Secondary | ICD-10-CM

## 2023-08-21 DIAGNOSIS — R2681 Unsteadiness on feet: Secondary | ICD-10-CM

## 2023-08-21 NOTE — Therapy (Signed)
OUTPATIENT PHYSICAL THERAPY VESTIBULAR TREATMENT     Patient Name: Molly Hill MRN: [id number] DOB:21-Mar-1951, 72 y.o., female Today's Date: 08/21/2023  END OF SESSION:  PT End of Session - 08/21/23 0843     Visit Number 3    Number of Visits 7    Date for PT Re-Evaluation 09/26/23    Authorization Type [employer]    Progress Note Due on Visit 10    PT Start Time 0845    PT Stop Time 0930    PT Time Calculation (min) 45 min             Past Medical History:  Diagnosis Date   Alopecia    Anemia    Arthritis    Bicuspid aortic valve    mild to moderate AS by echo 2022   Chronic diastolic (congestive) heart failure (HCC)    Coronary arteriosclerosis in native artery    s/p PCI of LAD in 2013 & NSTEMI with staged DES to pLAD (10/2021)   DM (diabetes mellitus), type 1 (HCC)    GERD (gastroesophageal reflux disease)    History of breast cancer    Estrogen receptor negative. s/p chemo   Hyperglycemia    Hyperlipidemia    Hypertension    Mitral regurgitation    mild to moderate by echo 2020   Myocardial infarction [facility]) 10/09/2021   S/P TAVR (transcatheter aortic valve replacement) 11/08/2021   s/p TAVR with a 23 mm Edwards S3UR via the TF approach by Dr. Excell Seltzer and Dr. Laneta Simmers   Tendinitis of left wrist    Past Surgical History:  Procedure Laterality Date   ANKLE SURGERY Bilateral    CATARACT EXTRACTION, BILATERAL     CHOLECYSTECTOMY     CORONARY PRESSURE/FFR STUDY N/A 10/21/2021   Procedure: INTRAVASCULAR PRESSURE WIRE/FFR STUDY;  Surgeon: Tonny Bollman, MD;  Location[facility] INVASIVE CV LAB;  Service: Cardiovascular;  Laterality: N/A;   CORONARY STENT INTERVENTION     CORONARY STENT INTERVENTION N/A 10/21/2021   Procedure: CORONARY STENT INTERVENTION;  Surgeon: Tonny Bollman, MD;  Location: [facility] INVASIVE CV LAB;  Service: Cardiovascular;  Laterality: N/A;   CORONARY/GRAFT ACUTE MI REVASCULARIZATION N/A 10/10/2021   Procedure: CORONARY/GRAFT ACUTE MI  REVASCULARIZATION;  Surgeon: Runell Gess, MD;  Location: [facility] INVASIVE CV LAB;  Service: Cardiovascular;  Laterality: N/A;   CTR     ELBOW SURGERY     HAND SURGERY     BOTH THUMBS AND MIDDLE FINGERS   INTRAOPERATIVE TRANSTHORACIC ECHOCARDIOGRAM N/A 11/08/2021   Procedure: INTRAOPERATIVE TRANSTHORACIC ECHOCARDIOGRAM;  Surgeon: Tonny Bollman, MD;  Location[facility] INVASIVE CV LAB;  Service: Open Heart Surgery;  Laterality: N/A;   LEFT BREAST MASTECTOMY Bilateral    MASCULAR  REPAIR     PORT-A-CATH REMOVAL     PORTACATH PLACEMENT     RIGHT MODIFIED MASTECTOMY Right    TRANSCATHETER AORTIC VALVE REPLACEMENT, TRANSFEMORAL N/A 11/08/2021   Procedure: TRANSCATHETER AORTIC VALVE REPLACEMENT, TRANSFEMORAL;  Surgeon: Tonny Bollman, MD;  Location: [facility] INVASIVE CV LAB;  Service: Open Heart Surgery;  Laterality: N/A;   UPPER LIP SURGERY     Patient Active Problem List   Diagnosis Date Noted   Heart failure (HCC) 09/20/2022   Type 1 diabetes mellitus with stage 3a chronic kidney disease (HCC) 02/15/2022   Type 1 diabetes mellitus with hyperglycemia (HCC) 02/15/2022   Dyslipidemia 02/15/2022   History of breast cancer 11/08/2021   GERD (gastroesophageal reflux disease) 11/08/2021   DM (diabetes mellitus), type 1 (HCC) 11/08/2021  Chronic diastolic (congestive) heart failure (HCC) 11/08/2021   S/P TAVR (transcatheter aortic valve replacement) 11/08/2021   Severe aortic stenosis 10/22/2021   Carcinoma of overlapping sites of right breast in female, estrogen receptor negative (HCC) 12/24/2019   Mitral regurgitation    Coronary arteriosclerosis in native artery 2013    PCP: Thana Ates, MD REFERRING PROVIDER: Graylin Shiver, MD  REFERRING DIAG: R42 (ICD-10-CM) - Dizziness and giddiness R26.89 (ICD-10-CM) - Other abnormalities of gait and mobility  THERAPY DIAG:  Dizziness and giddiness  Unsteadiness on feet  ONSET DATE: 07/08/23  Rationale for Evaluation and Treatment:  Rehabilitation  SUBJECTIVE:   SUBJECTIVE STATEMENT: Had a single episode of dizziness when leaning sideways to throw a box on Sunday, no dizziness with bed mobility as of late Pt accompanied by: self  PERTINENT HISTORY: 72 y.o. female is seen for evaluation of vertigo. She reports this has occurred mostly when lying back in bed. Worse with looking up, rolling over on the right.  Denies hearing changes. Reports poor balance. Vertigo lasting for seconds to minutes at a time. Was seen in ED end of September.  PAIN:  Are you having pain?  Neck has been stiff/sore  PRECAUTIONS: None  RED FLAGS: None   WEIGHT BEARING RESTRICTIONS: No  FALLS: Has patient fallen in last 6 months? No  LIVING ENVIRONMENT: Lives with: lives alone Lives in: House/apartment Stairs:  Has following equipment at home: None  PLOF: Independent  PATIENT GOALS: eliminate symptoms  OBJECTIVE:   TODAY'S TREATMENT: 08/21/23 Activity Comments  Left sidelying No nystagmus, no symptoms  Right sidelying Right upbeat nystagmus   Right Dix-Hallpike Right upbeating x 15 sec  R Epley   Right Dix-Hallpike No nystagmus, no symptoms  Head Impulse Test +Left > right  Corner balance EO/EC x 30 sec Head turns 3x EO/EC Semi-tandem x 30 sec  Seated/standing VOR Good speed, no symptoms     TODAY'S TREATMENT: 08/17/23 Activity Comments  M-CTSIB Condition 1: normal Condition 2: mild Condition 3: normal Condition 4:moderate-severe  Functional Gait Assessment 26/30  R DH Right upbeating nystagmus ~15 sec  R Epley   R DH Small amplitude right upbeating nystagmus > 1 min. Cupulo?  R Epley   Standing VOR x 1  X 30 sec at 120 bpm, no symptoms reported, good stability, cues for speed and amplitude  Explanation/demo of corner balance Feet together EO/EC 3x30 sec    PATIENT EDUCATION: Education details: assessment details, rationale of intervention Person educated: Patient Education method: Explanation Education  comprehension: verbalized understanding and needs further education  HOME EXERCISE PROGRAM: Access Code: [email or web address] URL: [email or web address] Date: 08/17/2023 Prepared by: Shary Decamp  Exercises  - Corner Balance Feet Together With Eyes Open  - 1 x daily - 7 x weekly - 3 sets - 30 sec hold - Corner Balance Feet Together With Eyes Closed  - 1 x daily - 7 x weekly - 3 sets - 30 sec hold - Corner Balance Feet Together: Eyes Open With Head Turns  - 1 x daily - 7 x weekly - 3 sets - 3 reps - Corner Balance Feet Together: Eyes Closed With Head Turns  - 1 x daily - 7 x weekly - 3 sets - 3 reps - Semi-Tandem Corner Balance With Eyes Open  - 1 x daily - 7 x weekly - 3 sets - 30 sec hold  Note: Objective measures were completed at Evaluation unless otherwise noted.  DIAGNOSTIC FINDINGS: pt reports MRI has been scheduled  COGNITION: Overall cognitive status: Within functional limits for tasks assessed   SENSATION: Not tested  EDEMA:  none  MUSCLE TONE:  NT  DTRs:  NT  POSTURE:  No Significant postural limitations  Cervical ROM:    Active A/PROM (deg) eval  Flexion WFL  Extension WFL  Right lateral flexion WFL  Left lateral flexion stiffness  Right rotation stiffness  Left rotation WFL  (Blank rows = not tested)  STRENGTH: NT   BED MOBILITY:  indep  TRANSFERS: Independent  RAMP: NT  CURB: NT  GAIT: Gait pattern: WFL Distance walked:  Assistive device utilized: None Level of assistance: Complete Independence Comments:   FUNCTIONAL TESTS:  Functional gait assessment: 26/30 M-CTSIB: TBD  PATIENT SURVEYS:  FOTO 47  VESTIBULAR ASSESSMENT:  GENERAL OBSERVATION:    SYMPTOM BEHAVIOR:  Subjective history: wears progressive lens with prisms due to diplopia  Non-Vestibular symptoms: neck pain and has hx of diplopia  Type of dizziness: Imbalance (Disequilibrium), Oscillopsia, Spinning/Vertigo, and Unsteady with head/body turns  Frequency:  daily with position changes, head movements  Duration: seconds-minutes, lingering symptoms  Aggravating factors: Induced by position change: lying supine, rolling to the right, and rolling to the left, Induced by motion: looking up at the ceiling, turning body quickly, and turning head quickly, Worse in the dark, and Worse outside or in busy environment  Relieving factors: head stationary, closing eyes, slow movements, and avoid busy/distracting environments  Progression of symptoms: unchanged  OCULOMOTOR EXAM:  Ocular Alignment: normal  Ocular ROM: No Limitations  Spontaneous Nystagmus: absent  Gaze-Induced Nystagmus: age appropriate nystagmus at end range  Smooth Pursuits: intact  Saccades: intact  Convergence/Divergence: 10 cm    -has baseline diplopia   VESTIBULAR - OCULAR REFLEX:   Slow VOR: Positive Bilaterally, horizontal > vertical, noted some extraneous eye movements with horizontal (saccades?)  VOR Cancellation: Comment: some instances of saccades? Difficult to distinguish if it was trouble with coordination  Head-Impulse Test: HIT Right: positive HIT Left: positive----very prominent and delayed  Dynamic Visual Acuity: Not able to be assessed   POSITIONAL TESTING: Right Dix-Hallpike: upbeating, right nystagmus Left Dix-Hallpike: no nystagmus Right Roll Test: no nystagmus Left Roll Test: no nystagmus -motion sensitive with roll test. Possibly left downbeating in position 2 during right Epley  MOTION SENSITIVITY:  Motion Sensitivity Quotient Intensity: 0 = none, 1 = Lightheaded, 2 = Mild, 3 = Moderate, 4 = Severe, 5 = Vomiting  Intensity  1. Sitting to supine   2. Supine to L side   3. Supine to R side   4. Supine to sitting   5. L Hallpike-Dix   6. Up from L    7. R Hallpike-Dix   8. Up from R    9. Sitting, head tipped to L knee   10. Head up from L knee   11. Sitting, head tipped to R knee   12. Head up from R knee   13. Sitting head turns x5   14.Sitting  head nods x5   15. In stance, 180 turn to L    16. In stance, 180 turn to R     OTHOSTATICS: not done      GOALS: Goals reviewed with patient? Yes  SHORT TERM GOALS: Target date: 08/29/2023    Patient will be independent in HEP to improve functional outcomes Baseline: Goal status: IN PROGRESS  2.  Report/demonstrate absent positional vertigo to improve comfort, balance, and safety to reduce risk for falls Baseline: +right Dix-Hallpike Goal status: IN  PROGRESS    LONG TERM GOALS: Target date: 09/26/2023    Meet predicted FOTO outcomes (61) Baseline: 47 Goal status: INITIAL  2.  Demonstrate low risk for falls per score 25/30 Functional Gait Assessment Baseline: 26/30 Goal status: MET  3.  Demo improved postural stability per mild sway condition 4 M-CTSIB to improve safety in ADL Baseline: TBD Goal status: INITIAL    ASSESSMENT:  CLINICAL IMPRESSION: Positional assessment with right upbeating nystagmus in right sidelying and Right Dix-Hallpike lasting approx 15 sec. Right Epley maneuver performed and re-test to Dix-Hallpike reveals no nystagmus or symptoms.  Review of corner balance activities with instability under eyes closed conditions and addition of head movements eyes open/closed to progess balance activities for multisensory exposure with difficulty with head movements and narrow BOS.  Review of VOR x 1 with good stability and no provocation of symptoms. Continued sessions to assess and treat positional dizziness as indicated and progress multi-sensory balance activities  OBJECTIVE IMPAIRMENTS: decreased activity tolerance, decreased balance, dizziness, hypomobility, and pain.   ACTIVITY LIMITATIONS: standing, sleeping, transfers, bed mobility, reach over head, and locomotion level  PARTICIPATION LIMITATIONS: meal prep, cleaning, shopping, community activity, and yard work  PERSONAL FACTORS: Age, Time since onset of injury/illness/exacerbation, and 1-2  comorbidities: DM, HTN  are also affecting patient's functional outcome.   REHAB POTENTIAL: Excellent  CLINICAL DECISION MAKING: Evolving/moderate complexity  EVALUATION COMPLEXITY: Moderate   PLAN:  PT FREQUENCY: 1-2x/week  PT DURATION: 6 weeks  PLANNED INTERVENTIONS: 97110-Therapeutic exercises, 97530- Therapeutic activity, [id number]- Neuromuscular re-education, 97535- Self Care, [id number]- Manual therapy, [phone]- Gait training, [phone]- Canalith repositioning, [phone]- Aquatic Therapy, Patient/Family education, Balance training, Stair training, Taping, Dry Needling, Joint mobilization, Spinal mobilization, Vestibular training, DME instructions, Cryotherapy, and Moist heat  PLAN FOR NEXT SESSION: assess/tx BPPV as indicated, progress multi-sensory balance (airex pad for purchase)   8:44 AM, 08/21/23 M. Shary Decamp, PT, DPT Physical Therapist- [place] Office Number: [phone]

## 2023-08-24 ENCOUNTER — Ambulatory Visit: Payer: Medicare HMO

## 2023-08-27 ENCOUNTER — Ambulatory Visit (HOSPITAL_COMMUNITY)
Admission: RE | Admit: 2023-08-27 | Discharge: 2023-08-27 | Disposition: A | Discharge status: Home / Self Care | Payer: Medicare HMO | Source: Ambulatory Visit | Attending: Vascular Surgery | Admitting: Vascular Surgery

## 2023-08-27 DIAGNOSIS — I6529 Occlusion and stenosis of unspecified carotid artery: Secondary | ICD-10-CM | POA: Diagnosis present

## 2023-10-02 ENCOUNTER — Encounter: Payer: Medicare HMO | Admitting: Vascular Surgery

## 2023-10-15 ENCOUNTER — Other Ambulatory Visit: Payer: Self-pay

## 2023-10-15 ENCOUNTER — Encounter (HOSPITAL_BASED_OUTPATIENT_CLINIC_OR_DEPARTMENT_OTHER): Payer: Self-pay

## 2023-10-15 DIAGNOSIS — I5032 Chronic diastolic (congestive) heart failure: Secondary | ICD-10-CM | POA: Diagnosis not present

## 2023-10-15 DIAGNOSIS — I110 Hypertensive heart disease with heart failure: Secondary | ICD-10-CM | POA: Diagnosis not present

## 2023-10-15 DIAGNOSIS — N1831 Chronic kidney disease, stage 3a: Secondary | ICD-10-CM | POA: Diagnosis not present

## 2023-10-15 DIAGNOSIS — Z7982 Long term (current) use of aspirin: Secondary | ICD-10-CM | POA: Diagnosis not present

## 2023-10-15 DIAGNOSIS — E1022 Type 1 diabetes mellitus with diabetic chronic kidney disease: Secondary | ICD-10-CM | POA: Diagnosis not present

## 2023-10-15 DIAGNOSIS — I83899 Varicose veins of unspecified lower extremities with other complications: Secondary | ICD-10-CM | POA: Diagnosis present

## 2023-10-15 DIAGNOSIS — E1065 Type 1 diabetes mellitus with hyperglycemia: Secondary | ICD-10-CM | POA: Diagnosis not present

## 2023-10-15 DIAGNOSIS — Z794 Long term (current) use of insulin: Secondary | ICD-10-CM | POA: Insufficient documentation

## 2023-10-15 NOTE — Progress Notes (Signed)
 Patient name: Molly Hill MRN: [id number] DOB: 1950/12/19 Sex: female  REASON FOR CONSULT: Vertigo, abnormal CTA head/neck  HPI: Molly Hill is a 72 y.o. female, with history of CHF, type 1 diabetes, hypertension, hyperlipidemia, aortic stenosis status post TAVR that presents for evaluation of vertigo with abnormal CTA head and neck.  She reports having years of vertigo.  She states is usually at night when laying down.  Better with meclizine .  She is seeing Dr. Terri with ENT at [facility].    Patient had a CTA head and neck on 07/08/2023.  This showed significant intracranial disease including severe stenosis in the right supraclinoid ICA as well as moderate stenosis of bilateral cavernous ICAs.  There was concern for 60% right and 70% left carotid stenosis extracranial.  Subsequent carotid duplex on 08/30/2023 showed 1 to 39% stenosis bilaterally.  Past Medical History:  Diagnosis Date   Alopecia    Anemia    Arthritis    Bicuspid aortic valve    mild to moderate AS by echo 2022   Chronic diastolic (congestive) heart failure (HCC)    Coronary arteriosclerosis in native artery    s/p PCI of LAD in 2013 & NSTEMI with staged DES to pLAD (10/2021)   DM (diabetes mellitus), type 1 (HCC)    GERD (gastroesophageal reflux disease)    History of breast cancer    Estrogen receptor negative. s/p chemo   Hyperglycemia    Hyperlipidemia    Hypertension    Mitral regurgitation    mild to moderate by echo 2020   Myocardial infarction [facility]) 10/09/2021   S/P TAVR (transcatheter aortic valve replacement) 11/08/2021   s/p TAVR with a 23 mm Edwards S3UR via the TF approach by Dr. Wonda and Dr. Lucas   Tendinitis of left wrist     Past Surgical History:  Procedure Laterality Date   ANKLE SURGERY Bilateral    CATARACT EXTRACTION, BILATERAL     CHOLECYSTECTOMY     CORONARY PRESSURE/FFR STUDY N/A 10/21/2021   Procedure: INTRAVASCULAR PRESSURE WIRE/FFR STUDY;  Surgeon: Wonda Sharper, MD;  Location: [facility] INVASIVE CV LAB;  Service: Cardiovascular;  Laterality: N/A;   CORONARY STENT INTERVENTION     CORONARY STENT INTERVENTION N/A 10/21/2021   Procedure: CORONARY STENT INTERVENTION;  Surgeon: Wonda Sharper, MD;  Location: [facility] INVASIVE CV LAB;  Service: Cardiovascular;  Laterality: N/A;   CORONARY/GRAFT ACUTE MI REVASCULARIZATION N/A 10/10/2021   Procedure: CORONARY/GRAFT ACUTE MI REVASCULARIZATION;  Surgeon: Court Dorn PARAS, MD;  Location: [facility] INVASIVE CV LAB;  Service: Cardiovascular;  Laterality: N/A;   CTR     ELBOW SURGERY     HAND SURGERY     BOTH THUMBS AND MIDDLE FINGERS   INTRAOPERATIVE TRANSTHORACIC ECHOCARDIOGRAM N/A 11/08/2021   Procedure: INTRAOPERATIVE TRANSTHORACIC ECHOCARDIOGRAM;  Surgeon: Wonda Sharper, MD;  Location: [facility] INVASIVE CV LAB;  Service: Open Heart Surgery;  Laterality: N/A;   LEFT BREAST MASTECTOMY Bilateral    MASCULAR  REPAIR     PORT-A-CATH REMOVAL     PORTACATH PLACEMENT     RIGHT MODIFIED MASTECTOMY Right    TRANSCATHETER AORTIC VALVE REPLACEMENT, TRANSFEMORAL N/A 11/08/2021   Procedure: TRANSCATHETER AORTIC VALVE REPLACEMENT, TRANSFEMORAL;  Surgeon: Wonda Sharper, MD;  Location: [facility] INVASIVE CV LAB;  Service: Open Heart Surgery;  Laterality: N/A;   UPPER LIP SURGERY      Family History  Problem Relation Age of Onset   Heart attack Mother    CAD Father  CABG   Diabetes Father    Stroke Father    Colon cancer Father        62s   Diabetes Brother    Diabetes Paternal Grandfather    Heart attack Paternal Grandfather    Breast cancer Maternal Aunt        43s    SOCIAL HISTORY: Social History   Socioeconomic History   Marital status: Widowed    Spouse name: Not on file   Number of children: Not on file   Years of education: Not on file   Highest education level: Not on file  Occupational History   Not on file  Tobacco Use   Smoking status: Former    Current packs/day: 0.00    Types: Cigarettes    Start  date: 08/14/1976    Quit date: 08/15/1979    Years since quitting: 44.1   Smokeless tobacco: Never   Tobacco comments:    1 pack would last a week.  Vaping Use   Vaping status: Never Used  Substance and Sexual Activity   Alcohol use: Never   Drug use: Never   Sexual activity: Never  Other Topics Concern   Not on file  Social History Narrative   Not on file   Social Drivers of Health   Financial Resource Strain: Not on file  Food Insecurity: Not on file  Transportation Needs: Not on file  Physical Activity: Not on file  Stress: Not on file  Social Connections: Not on file  Intimate Partner Violence: Not on file    Allergies  Allergen Reactions   Codeine      Hyper with vomiting   Onion Diarrhea, Nausea And Vomiting and Other (See Comments)    syncope   Reglan [Metoclopramide]     pulls muscle to side feel paralyzed    Current Outpatient Medications  Medication Sig Dispense Refill   acetaminophen  (TYLENOL ) 500 MG tablet Take 1,000 mg by mouth every 6 (six) hours as needed for moderate pain or headache.     amLODipine  (NORVASC ) 2.5 MG tablet TAKE 1 TABLET BY MOUTH EVERY DAY 90 tablet 3   amoxicillin  (AMOXIL ) 500 MG capsule Take 4 capsules by mouth 1 hour prior to dental appointments (Patient not taking: Reported on 07/30/2023) 4 capsule 3   aspirin  EC 81 MG tablet Take 81 mg by mouth at bedtime.     atorvastatin  (LIPITOR ) 80 MG tablet TAKE 1 TABLET BY MOUTH EVERYDAY AT BEDTIME 90 tablet 3   BIOTIN  PO Take by mouth daily.     Biotin  w/ Vitamins C & E (HAIR/SKIN/NAILS PO) Take 1 capsule by mouth daily.     bisacodyl  (DULCOLAX) 5 MG EC tablet Take 2 tablets (10 mg total) by mouth daily as needed for moderate constipation. 30 tablet 0   Calcium  Carb-Cholecalciferol  (CALCIUM  600 + D PO) Take 1 tablet by mouth daily.     cholecalciferol  (VITAMIN D ) 25 MCG (1000 UNIT) tablet Take 1,000 Units by mouth daily.     Continuous Blood Gluc Sensor (DEXCOM G7 SENSOR) MISC 1 Device  by Does not apply route every 30 (thirty) days.     docusate sodium  (COLACE) 100 MG capsule Take 2 capsules (200 mg total) by mouth daily. 10 capsule 0   erythromycin  ophthalmic ointment Place a 1/2 inch ribbon of ointment into the right lower eyelid 3 times daily for 7 days. 3.5 g 0   fexofenadine (ALLEGRA) 180 MG tablet Take 180 mg by mouth daily as needed for  allergies.     fluticasone  (FLONASE ) 50 MCG/ACT nasal spray Place 1 spray into both nostrils as needed for allergies.     furosemide  (LASIX ) 40 MG tablet Take 40 mg by mouth daily.     glucose blood (ACCU-CHEK AVIVA PLUS) test strip Check sugar 4x daily 400 each 2   glucose blood test strip 1 each by Other route 4 (four) times daily -  before meals and at bedtime. Use as instructed     insulin  aspart (NOVOLOG  FLEXPEN) 100 UNIT/ML FlexPen Max daily 45 units per scale 45 mL 3   insulin  degludec (TRESIBA  FLEXTOUCH) 100 UNIT/ML FlexTouch Pen Inject 22 Units into the skin daily. 30 mL 4   Insulin  Pen Needle 32G X 4 MM MISC 1 Device by Does not apply route in the morning, at noon, in the evening, and at bedtime. 400 each 3   loperamide  (IMODIUM  A-D) 2 MG tablet Take 1-2 mg by mouth 4 (four) times daily as needed for diarrhea or loose stools.     losartan  (COZAAR ) 25 MG tablet Take 1 tablet (25 mg total) by mouth daily. 90 tablet 3   meclizine  (ANTIVERT ) 25 MG tablet Take 1 tablet (25 mg total) by mouth 3 (three) times daily as needed for dizziness. 30 tablet 0   Melatonin 12 MG TABS Take 1 tablet by mouth daily. 1 tab at bedtime     metoprolol  succinate (TOPROL  XL) 25 MG 24 hr tablet Take 1 tablet (25 mg total) by mouth daily. 90 tablet 3   Naphazoline-Pheniramine (EYE ALLERGY RELIEF OP) Place 1 drop into both eyes daily as needed (allergies).     nitroGLYCERIN  (NITROSTAT ) 0.4 MG SL tablet Place 1 tablet (0.4 mg total) under the tongue every 5 (five) minutes as needed for chest pain. 25 tablet 3   vitamin B-12 (CYANOCOBALAMIN ) 1000 MCG tablet  Take 1,000 mcg by mouth daily.     vitamin C (ASCORBIC ACID) 500 MG tablet Take 500 mg by mouth daily.     Zinc 50 MG CAPS Take 50 mg by mouth daily.     No current facility-administered medications for this visit.    REVIEW OF SYSTEMS:  [X]  denotes positive finding, [ ]  denotes negative finding Cardiac  Comments:  Chest pain or chest pressure:    Shortness of breath upon exertion:    Short of breath when lying flat:    Irregular heart rhythm:        Vascular    Pain in calf, thigh, or hip brought on by ambulation:    Pain in feet at night that wakes you up from your sleep:     Blood clot in your veins:    Leg swelling:         Pulmonary    Oxygen at home:    Productive cough:     Wheezing:         Neurologic    Sudden weakness in arms or legs:     Sudden numbness in arms or legs:     Sudden onset of difficulty speaking or slurred speech:    Temporary loss of vision in one eye:     Problems with dizziness:         Gastrointestinal    Blood in stool:     Vomited blood:         Genitourinary    Burning when urinating:     Blood in urine:        Psychiatric  Major depression:         Hematologic    Bleeding problems:    Problems with blood clotting too easily:        Skin    Rashes or ulcers:        Constitutional    Fever or chills:      PHYSICAL EXAM: There were no vitals filed for this visit.  GENERAL: The patient is a well-nourished female, in no acute distress. The vital signs are documented above. CARDIAC: There is a regular rate and rhythm.  VASCULAR:  Palpable radial pulses bilaterally PULMONARY: No respiratory distress. ABDOMEN: Soft and non-tender.. MUSCULOSKELETAL: There are no major deformities or cyanosis. NEUROLOGIC: No focal weakness or paresthesias are detected.  Cranial nerves II through XII grossly intact. SKIN: There are no ulcers or rashes noted. PSYCHIATRIC: The patient has a normal affect.  DATA:   CTA head and neck  07/08/2023  IMPRESSION: 1. No acute intracranial process. 2. No intracranial large vessel occlusion. Severe stenosis in the proximal right supraclinoid ICA, moderate stenosis in the bilateral cavernous ICA and left supraclinoid ICA, and moderate stenosis in the proximal right PCA. 3. 70% stenosis in the distal left CCA and 50% stenosis in the proximal left ICA. 4. 60% stenosis in the proximal right ICA and 50% stenosis in the distal right CCA. 5. No hemodynamically significant stenosis in the vertebral arteries. 6. Aortic atherosclerosis.   Aortic Atherosclerosis (ICD10-I70.0).     Electronically Signed   By: Donald Campion M.D.   On: 07/08/2023 19:25  Assessment/Plan:  72 y.o. female, with history of CHF, type 1 diabetes, hypertension, hyperlipidemia, aortic stenosis status post TAVR that presents for evaluation of vertigo with abnormal CTA head and neck.  Discussed that most of her significant carotid artery disease is intracranial and we would recommend medical management for intra-cranial carotid disease.  Her extracranial carotid artery disease is less than 80% and there is no indication for surgical intervention given she has asymptomatic carotid disease.  Discussed for asymptomatic carotid disease we recommend surgery for greater than 80% carotid stenosis for surgical intervention.  Discussed I do not think this is a primary driver of her vertigo.  I reviewed her CTA neck myself and I do not think her carotid artery disease in the extracranial segment is more than 50%.  This correlates with her ultrasound that shows 1 to 39% stenosis bilaterally.  Both of her vertebral arteries are widely patent and there is no evidence of significant posterior circulation issues.  Discussed we will continue surveillance with repeat carotid duplex in 6 months.  Discussed she stay on aspirin  statin for risk reduction.   Lonni DOROTHA Gaskins, MD [facility] Office:  [phone]

## 2023-10-15 NOTE — ED Triage Notes (Signed)
Pt c/o "bleed" in R thigh/ knee area, states she hit in the shower. Advised EMTs evaluated & advised her to keep it dressed until it could be evaluated.   EDP in triage to assess

## 2023-10-15 NOTE — ED Provider Triage Note (Signed)
Emergency Medicine Provider Triage Evaluation Note  Molly Hill , a 72 y.o. female  was evaluated in triage.  Pt complains of bleeding above right knee.  History of varicose veins and "nicked 1" while shaving.  Was unable to control bleeding at home even with EMT wrapping.  Denies any anticoagulation use but does take a daily aspirin..  Review of Systems  Positive: See above Negative:   Physical Exam  BP (!) 197/66 (BP Location: Right Arm)   Pulse 77   Resp 16   SpO2 99%  Gen:   Awake, no distress   Resp:  Normal effort  MSK:   Moves extremities without difficulty  Other:  Bleeding appreciated from superficial vessel proximal right knee.  Quick clot and compressive bandage placed.  Medical Decision Making  Medically screening exam initiated at 6:57 PM.  Appropriate orders placed.  Molly Hill was informed that the remainder of the evaluation will be completed by another provider, this initial triage assessment does not replace that evaluation, and the importance of remaining in the ED until their evaluation is complete.     Peter Garter, [place] 10/15/23 [phone]

## 2023-10-16 ENCOUNTER — Emergency Department (HOSPITAL_BASED_OUTPATIENT_CLINIC_OR_DEPARTMENT_OTHER)
Admission: EM | Admit: 2023-10-16 | Discharge: 2023-10-16 | Disposition: A | Discharge status: Home / Self Care | Payer: Medicare HMO | Attending: Emergency Medicine | Admitting: Emergency Medicine

## 2023-10-16 ENCOUNTER — Ambulatory Visit: Payer: Medicare HMO | Admitting: Vascular Surgery

## 2023-10-16 ENCOUNTER — Encounter: Payer: Self-pay | Admitting: Vascular Surgery

## 2023-10-16 VITALS — BP 169/78 | HR 84 | Temp 97.7°F | Resp 18 | Ht 59.0 in | Wt 145.8 lb

## 2023-10-16 DIAGNOSIS — I779 Disorder of arteries and arterioles, unspecified: Secondary | ICD-10-CM | POA: Insufficient documentation

## 2023-10-16 DIAGNOSIS — I6523 Occlusion and stenosis of bilateral carotid arteries: Secondary | ICD-10-CM

## 2023-10-16 DIAGNOSIS — I83899 Varicose veins of unspecified lower extremities with other complications: Secondary | ICD-10-CM

## 2023-10-16 NOTE — ED Provider Notes (Signed)
 [place] EMERGENCY DEPARTMENT AT [facility] Provider Note  CSN: [id number] Arrival date & time: 10/15/23 1755  Chief Complaint(s) Leg Injury  HPI Molly Hill is a 72 y.o. female here for bleeding varicose vein.  Patient was taken a bath and shaving when he began to bleed.  Patient is not anticoagulated.  Unable to control the bleeding.  HPI  Past Medical History Past Medical History:  Diagnosis Date   Alopecia    Anemia    Arthritis    Bicuspid aortic valve    mild to moderate AS by echo 2022   Chronic diastolic (congestive) heart failure (HCC)    Coronary arteriosclerosis in native artery    s/p PCI of LAD in 2013 & NSTEMI with staged DES to pLAD (10/2021)   DM (diabetes mellitus), type 1 (HCC)    GERD (gastroesophageal reflux disease)    History of breast cancer    Estrogen receptor negative. s/p chemo   Hyperglycemia    Hyperlipidemia    Hypertension    Mitral regurgitation    mild to moderate by echo 2020   Myocardial infarction [facility]) 10/09/2021   S/P TAVR (transcatheter aortic valve replacement) 11/08/2021   s/p TAVR with a 23 mm Edwards S3UR via the TF approach by Dr. Wonda and Dr. Lucas   Tendinitis of left wrist    Patient Active Problem List   Diagnosis Date Noted   Heart failure (HCC) 09/20/2022   Type 1 diabetes mellitus with stage 3a chronic kidney disease (HCC) 02/15/2022   Type 1 diabetes mellitus with hyperglycemia (HCC) 02/15/2022   Dyslipidemia 02/15/2022   History of breast cancer 11/08/2021   GERD (gastroesophageal reflux disease) 11/08/2021   DM (diabetes mellitus), type 1 (HCC) 11/08/2021   Chronic diastolic (congestive) heart failure (HCC) 11/08/2021   S/P TAVR (transcatheter aortic valve replacement) 11/08/2021   Severe aortic stenosis 10/22/2021   Carcinoma of overlapping sites of right breast in female, estrogen receptor negative (HCC) 12/24/2019   Mitral regurgitation    Coronary arteriosclerosis in native artery 2013    Home Medication(s) Prior to Admission medications   Medication Sig Start Date End Date Taking? Authorizing Provider  acetaminophen  (TYLENOL ) 500 MG tablet Take 1,000 mg by mouth every 6 (six) hours as needed for moderate pain or headache.    [provider]  amLODipine  (NORVASC ) 2.5 MG tablet TAKE 1 TABLET BY MOUTH EVERY DAY 04/13/23   Pietro Redell RAMAN, MD  amoxicillin  (AMOXIL ) 500 MG capsule Take 4 capsules by mouth 1 hour prior to dental appointments Patient not taking: Reported on 07/30/2023 11/16/21   Sebastian Lamarr SAUNDERS, PA-C  aspirin  EC 81 MG tablet Take 81 mg by mouth at bedtime.    [provider]  atorvastatin  (LIPITOR ) 80 MG tablet TAKE 1 TABLET BY MOUTH EVERYDAY AT BEDTIME 09/22/22   Wonda Sharper, MD  BIOTIN  PO Take by mouth daily.    [provider]  Biotin  w/ Vitamins C & E (HAIR/SKIN/NAILS PO) Take 1 capsule by mouth daily.    [provider]  bisacodyl  (DULCOLAX) 5 MG EC tablet Take 2 tablets (10 mg total) by mouth daily as needed for moderate constipation. 10/14/21   Barrett, Shona MATSU, PA-C  Calcium  Carb-Cholecalciferol  (CALCIUM  600 + D PO) Take 1 tablet by mouth daily.    [provider]  cholecalciferol  (VITAMIN D ) 25 MCG (1000 UNIT) tablet Take 1,000 Units by mouth daily.    [provider]  Continuous Blood Gluc Sensor (DEXCOM G7 SENSOR)  MISC 1 Device by Does not apply route every 30 (thirty) days.    [provider]  docusate sodium  (COLACE) 100 MG capsule Take 2 capsules (200 mg total) by mouth daily. 10/15/21   Barrett, Rhonda G, PA-C  erythromycin  ophthalmic ointment Place a 1/2 inch ribbon of ointment into the right lower eyelid 3 times daily for 7 days. 02/03/23   Long, Joshua G, MD  fexofenadine (ALLEGRA) 180 MG tablet Take 180 mg by mouth daily as needed for allergies.    [provider]  fluticasone  (FLONASE ) 50 MCG/ACT nasal spray Place 1 spray into both nostrils as needed for allergies.     [provider]  furosemide  (LASIX ) 40 MG tablet Take 40 mg by mouth daily. 02/02/23   [provider]  glucose blood (ACCU-CHEK AVIVA PLUS) test strip Check sugar 4x daily 08/03/23   Shamleffer, Ibtehal Jaralla, MD  glucose blood test strip 1 each by Other route 4 (four) times daily -  before meals and at bedtime. Use as instructed    [provider]  insulin  aspart (NOVOLOG  FLEXPEN) 100 UNIT/ML FlexPen Max daily 45 units per scale 07/30/23   Shamleffer, Ibtehal Jaralla, MD  insulin  degludec (TRESIBA  FLEXTOUCH) 100 UNIT/ML FlexTouch Pen Inject 22 Units into the skin daily. 07/30/23   Shamleffer, Ibtehal Jaralla, MD  Insulin  Pen Needle 32G X 4 MM MISC 1 Device by Does not apply route in the morning, at noon, in the evening, and at bedtime. 07/30/23   Shamleffer, Ibtehal Jaralla, MD  loperamide  (IMODIUM  A-D) 2 MG tablet Take 1-2 mg by mouth 4 (four) times daily as needed for diarrhea or loose stools.    [provider]  losartan  (COZAAR ) 25 MG tablet Take 1 tablet (25 mg total) by mouth daily. 09/22/22   Sebastian Lamarr SAUNDERS, PA-C  meclizine  (ANTIVERT ) 25 MG tablet Take 1 tablet (25 mg total) by mouth 3 (three) times daily as needed for dizziness. 07/08/23   Curatolo, Adam, DO  Melatonin 12 MG TABS Take 1 tablet by mouth daily. 1 tab at bedtime    [provider]  metoprolol  succinate (TOPROL  XL) 25 MG 24 hr tablet Take 1 tablet (25 mg total) by mouth daily. 07/12/21   Shlomo Wilbert SAUNDERS, MD  Naphazoline-Pheniramine (EYE ALLERGY RELIEF OP) Place 1 drop into both eyes daily as needed (allergies).    [provider]  nitroGLYCERIN  (NITROSTAT ) 0.4 MG SL tablet Place 1 tablet (0.4 mg total) under the tongue every 5 (five) minutes as needed for chest pain. 10/14/21   Pietro Redell RAMAN, MD  vitamin B-12 (CYANOCOBALAMIN ) 1000 MCG tablet Take 1,000 mcg by mouth daily.    [provider]  vitamin C (ASCORBIC ACID) 500 MG tablet Take 500 mg by mouth daily.     [provider]  Zinc 50 MG CAPS Take 50 mg by mouth daily.    [provider]  Allergies Codeine , Onion, and Reglan [metoclopramide]  Review of Systems Review of Systems As noted in HPI  Physical Exam Vital Signs  I have reviewed the triage vital signs BP (!) 154/57   Pulse 79   Temp 98.3 F (36.8 C) (Oral)   Resp 18   Ht 4' 11 (1.499 m)   Wt 67.6 kg   SpO2 96%   BMI 30.10 kg/m   Physical Exam Vitals reviewed.  Constitutional:      General: She is not in acute distress.    Appearance: She is well-developed. She is not diaphoretic.  HENT:     Head: Normocephalic and atraumatic.     Right Ear: External ear normal.     Left Ear: External ear normal.     Nose: Nose normal.  Eyes:     General: No scleral icterus.    Conjunctiva/sclera: Conjunctivae normal.  Neck:     Trachea: Phonation normal.  Cardiovascular:     Rate and Rhythm: Normal rate and regular rhythm.  Pulmonary:     Effort: Pulmonary effort is normal. No respiratory distress.     Breath sounds: No stridor.  Abdominal:     General: There is no distension.  Musculoskeletal:        General: Normal range of motion.     Cervical back: Normal range of motion.       Legs:  Neurological:     Mental Status: She is alert and oriented to person, place, and time.  Psychiatric:        Behavior: Behavior normal.     ED Results and Treatments Labs (all labs ordered are listed, but only abnormal results are displayed) Labs Reviewed - No data to display                                                                                                                       EKG  EKG Interpretation Date/Time:    Ventricular Rate:    PR Interval:    QRS Duration:    QT Interval:    QTC Calculation:   R Axis:      Text Interpretation:         Radiology No  results found.  Medications Ordered in ED Medications - No data to display Procedures Procedures  (including critical care time) Medical Decision Making / ED Course   Medical Decision Making   Bleeding varicose vein.  Hemostasis achieved with topical bleeding powder and pressure.     Final Clinical Impression(s) / ED Diagnoses Final diagnoses:  Bleeding from varicose vein   The patient appears reasonably screened and/or stabilized for discharge and I doubt any other medical condition or other [facility] requiring further screening, evaluation, or treatment in the ED at this time. I have discussed the findings, Dx and Tx plan with the patient/family who expressed understanding and agree(s) with the plan. Discharge instructions discussed at length. The patient/family was given strict return precautions who verbalized understanding of the instructions. No  further questions at time of discharge.  Disposition: Discharge  Condition: Good  ED Discharge Orders     None        Follow Up: Dwight Trula SQUIBB, MD [address] [place] [address] [phone]  Call  to schedule an appointment for close follow up     This chart was dictated using voice recognition software.  Despite best efforts to proofread,  errors can occur which can change the documentation meaning.    Trine Raynell Moder, MD 10/16/23 [phone]

## 2023-10-16 NOTE — ED Notes (Signed)
Patient denies pain and is resting comfortably.  

## 2023-10-16 NOTE — ED Notes (Signed)
Pt bleeding is controlled at this time.

## 2023-10-16 NOTE — ED Notes (Signed)
 Pt called out stating her leg was bleeding again. Went in, she did have a small amount of oozing noted at the wound. Went to speak with EDP, when I returned to wrap up the leg, the bleeding had already subsided. Did rewrap in coban until provider can return for assessment.

## 2023-10-30 ENCOUNTER — Encounter: Payer: Medicare HMO | Admitting: Vascular Surgery

## 2023-10-30 ENCOUNTER — Other Ambulatory Visit: Payer: Self-pay

## 2023-10-30 DIAGNOSIS — I6523 Occlusion and stenosis of bilateral carotid arteries: Secondary | ICD-10-CM

## 2023-11-05 ENCOUNTER — Ambulatory Visit: Payer: Medicare HMO | Admitting: Internal Medicine

## 2023-11-28 ENCOUNTER — Other Ambulatory Visit: Payer: Self-pay | Admitting: Physician Assistant

## 2023-11-30 ENCOUNTER — Ambulatory Visit: Payer: Medicare HMO | Admitting: Internal Medicine

## 2023-12-04 ENCOUNTER — Other Ambulatory Visit: Payer: Self-pay | Admitting: Internal Medicine

## 2023-12-04 ENCOUNTER — Other Ambulatory Visit: Payer: Self-pay | Admitting: Cardiology

## 2023-12-28 ENCOUNTER — Ambulatory Visit: Payer: Medicare HMO | Admitting: Internal Medicine

## 2023-12-28 ENCOUNTER — Encounter: Payer: Self-pay | Admitting: Internal Medicine

## 2023-12-28 VITALS — BP 142/80 | HR 78 | Resp 20 | Ht 59.0 in | Wt 147.4 lb

## 2023-12-28 DIAGNOSIS — N1831 Chronic kidney disease, stage 3a: Secondary | ICD-10-CM

## 2023-12-28 DIAGNOSIS — E1022 Type 1 diabetes mellitus with diabetic chronic kidney disease: Secondary | ICD-10-CM | POA: Diagnosis not present

## 2023-12-28 LAB — POCT GLYCOSYLATED HEMOGLOBIN (HGB A1C): Hemoglobin A1C: 7.8 % — AB (ref 4.0–5.6)

## 2023-12-28 NOTE — Progress Notes (Signed)
 Name: Molly Hill  MRN/ DOB: [id number], Jan 22, 1951   Age/ Sex: 73 y.o., female    PCP: Thana Ates, MD   Reason for Endocrinology Evaluation: Type 2 Diabetes Mellitus     Date of Initial Endocrinology Visit: 02/15/2022    PATIENT IDENTIFIER: [name] is a 73 y.o. female with a past medical history of T2DM, S/P TAVR, CAD, Hx of Breast ca (Dx 2016) . The patient presented for initial endocrinology clinic visit on 02/15/2022 for consultative assistance with her diabetes management.    HPI: [name] was   Diagnosed with DM at age 41  Hemoglobin A1c 7.8%     On her initial visit to uor clinic, her A1c was 8.0 %, she was on regular insulin and NPH. We switched her to insulin analogues and adjusted the doses , dexcom was faxed to DME  SUBJECTIVE:   During the last visit (07/30/2023): A1c 6.8%    Today (12/28/23): [name] is here for a follow up on diabetes management. She  checks her blood sugars multiple times daily. The patient has had hypoglycemic episodes since the last clinic visit. The patient is  symptomatic with these episodes.  She continues to follow-up with cardiology s/p TAVR and CAD/CHF She underwent PT for vertigo by the end of 2024 She follows with vascular surgery for significant intracranial disease including severe stenosis in the right internal carotid artery as well as bilateral stenosis of bilateral cavernous ICAs, she is on medical management  Denies nausea, vomiting  Denies constipation or diarrhea     HOME DIABETES REGIMEN: Tresiba 22 units daily  Novolog 3/3/5 units TIDQAC CF: NOvolog (BG -130/35)     Statin: yes ACE-I/ARB: yes    CONTINUOUS GLUCOSE MONITORING RECORD INTERPRETATION    Dates of Recording: 3/1-3/14/2025  Sensor description:dexcom G7  Results statistics:   CGM use % of time 89  Average and SD 182/68  Time in range 49 %  % Time Above 180 35  % Time above 250 16  % Time Below target 2   Glycemic patterns  summary: BGs trend down to optimal overnight and increased throughout the day following meals  Hyperglycemic episodes postprandial  Hypoglycemic episodes occurred at variable times specially dinner and following a bolus  Overnight periods: Optimal    DIABETIC COMPLICATIONS: Microvascular complications:  CKD III  Denies: Retinopathy, neuropathy  Last eye exam: Completed 01/2022 ( Dr. Dione Booze)  Macrovascular complications:  CAD Denies:  PVD, CVA   PAST HISTORY: Past Medical History:  Past Medical History:  Diagnosis Date   Alopecia    Anemia    Arthritis    Bicuspid aortic valve    mild to moderate AS by echo 2022   Chronic diastolic (congestive) heart failure (HCC)    Coronary arteriosclerosis in native artery    s/p PCI of LAD in 2013 & NSTEMI with staged DES to pLAD (10/2021)   DM (diabetes mellitus), type 1 (HCC)    GERD (gastroesophageal reflux disease)    History of breast cancer    Estrogen receptor negative. s/p chemo   Hyperglycemia    Hyperlipidemia    Hypertension    Mitral regurgitation    mild to moderate by echo 2020   Myocardial infarction [facility]) 10/09/2021   S/P TAVR (transcatheter aortic valve replacement) 11/08/2021   s/p TAVR with a 23 mm Edwards S3UR via the TF approach by Dr. Excell Seltzer and Dr. Laneta Simmers   Tendinitis of left wrist    Past  Surgical History:  Past Surgical History:  Procedure Laterality Date   ANKLE SURGERY Bilateral    CATARACT EXTRACTION, BILATERAL     CHOLECYSTECTOMY     CORONARY PRESSURE/FFR STUDY N/A 10/21/2021   Procedure: INTRAVASCULAR PRESSURE WIRE/FFR STUDY;  Surgeon: Tonny Bollman, MD;  Location[facility] INVASIVE CV LAB;  Service: Cardiovascular;  Laterality: N/A;   CORONARY STENT INTERVENTION     CORONARY STENT INTERVENTION N/A 10/21/2021   Procedure: CORONARY STENT INTERVENTION;  Surgeon: Tonny Bollman, MD;  Location: [facility] INVASIVE CV LAB;  Service: Cardiovascular;  Laterality: N/A;   CORONARY/GRAFT ACUTE MI REVASCULARIZATION  N/A 10/10/2021   Procedure: CORONARY/GRAFT ACUTE MI REVASCULARIZATION;  Surgeon: Runell Gess, MD;  Location: [facility] INVASIVE CV LAB;  Service: Cardiovascular;  Laterality: N/A;   CTR     ELBOW SURGERY     HAND SURGERY     BOTH THUMBS AND MIDDLE FINGERS   INTRAOPERATIVE TRANSTHORACIC ECHOCARDIOGRAM N/A 11/08/2021   Procedure: INTRAOPERATIVE TRANSTHORACIC ECHOCARDIOGRAM;  Surgeon: Tonny Bollman, MD;  Location: [facility] INVASIVE CV LAB;  Service: Open Heart Surgery;  Laterality: N/A;   LEFT BREAST MASTECTOMY Bilateral    MASCULAR  REPAIR     PORT-A-CATH REMOVAL     PORTACATH PLACEMENT     RIGHT MODIFIED MASTECTOMY Right    TRANSCATHETER AORTIC VALVE REPLACEMENT, TRANSFEMORAL N/A 11/08/2021   Procedure: TRANSCATHETER AORTIC VALVE REPLACEMENT, TRANSFEMORAL;  Surgeon: Tonny Bollman, MD;  Location: [facility] INVASIVE CV LAB;  Service: Open Heart Surgery;  Laterality: N/A;   UPPER LIP SURGERY      Social History:  reports that she quit smoking about 44 years ago. Her smoking use included cigarettes. She started smoking about 47 years ago. She has never used smokeless tobacco. She reports that she does not drink alcohol and does not use drugs. Family History:  Family History  Problem Relation Age of Onset   Heart attack Mother    CAD Father        CABG   Diabetes Father    Stroke Father    Colon cancer Father        20s   Diabetes Brother    Diabetes Paternal Grandfather    Heart attack Paternal Grandfather    Breast cancer Maternal Aunt        64s     HOME MEDICATIONS: Allergies as of 12/28/2023       Reactions   Codeine    Hyper with vomiting   Onion Diarrhea, Nausea And Vomiting, Other (See Comments)   syncope   Reglan [metoclopramide]    "pulls muscle to side" "feel paralyzed"        Medication List        Accurate as of December 28, 2023  9:25 AM. If you have any questions, ask your nurse or doctor.          acetaminophen 500 MG tablet Commonly known as: TYLENOL Take  1,000 mg by mouth every 6 (six) hours as needed for moderate pain or headache.   amLODipine 2.5 MG tablet Commonly known as: NORVASC TAKE 1 TABLET BY MOUTH EVERY DAY   amoxicillin 500 MG capsule Commonly known as: AMOXIL Take 4 capsules by mouth 1 hour prior to dental appointments   ascorbic acid 500 MG tablet Commonly known as: VITAMIN C Take 500 mg by mouth daily.   aspirin EC 81 MG tablet Take 81 mg by mouth at bedtime.   atorvastatin 80 MG tablet Commonly known as: LIPITOR TAKE 1 TABLET BY MOUTH EVERYDAY AT  BEDTIME   BIOTIN PO Take by mouth daily.   bisacodyl 5 MG EC tablet Generic drug: bisacodyl Take 2 tablets (10 mg total) by mouth daily as needed for moderate constipation.   CALCIUM 600 + D PO Take 1 tablet by mouth daily.   cholecalciferol 25 MCG (1000 UNIT) tablet Commonly known as: VITAMIN D3 Take 1,000 Units by mouth daily.   cyanocobalamin 1000 MCG tablet Commonly known as: VITAMIN B12 Take 1,000 mcg by mouth daily.   Dexcom G7 Sensor Misc 1 Device by Does not apply route every 30 (thirty) days.   docusate sodium 100 MG capsule Commonly known as: COLACE Take 2 capsules (200 mg total) by mouth daily.   erythromycin ophthalmic ointment Place a 1/2 inch ribbon of ointment into the right lower eyelid 3 times daily for 7 days.   EYE ALLERGY RELIEF OP Place 1 drop into both eyes daily as needed (allergies).   fexofenadine 180 MG tablet Commonly known as: ALLEGRA Take 180 mg by mouth daily as needed for allergies.   fluticasone 50 MCG/ACT nasal spray Commonly known as: FLONASE Place 1 spray into both nostrils as needed for allergies.   furosemide 40 MG tablet Commonly known as: LASIX Take 40 mg by mouth daily.   glucose blood test strip 1 each by Other route 4 (four) times daily -  before meals and at bedtime. Use as instructed   Accu-Chek Aviva Plus test strip Generic drug: glucose blood Check sugar 4x daily   HAIR/SKIN/NAILS PO Take 1  capsule by mouth daily.   Insulin Pen Needle 32G X 4 MM Misc 1 Device by Does not apply route in the morning, at noon, in the evening, and at bedtime.   loperamide 2 MG tablet Commonly known as: IMODIUM A-D Take 1-2 mg by mouth 4 (four) times daily as needed for diarrhea or loose stools.   losartan 25 MG tablet Commonly known as: COZAAR TAKE 1 TABLET (25 MG TOTAL) BY MOUTH DAILY.   meclizine 25 MG tablet Commonly known as: ANTIVERT Take 1 tablet (25 mg total) by mouth 3 (three) times daily as needed for dizziness.   Melatonin 12 MG Tabs Take 1 tablet by mouth daily. 1 tab at bedtime   metoprolol succinate 25 MG 24 hr tablet Commonly known as: Toprol XL Take 1 tablet (25 mg total) by mouth daily.   nitroGLYCERIN 0.4 MG SL tablet Commonly known as: NITROSTAT Place 1 tablet (0.4 mg total) under the tongue every 5 (five) minutes as needed for chest pain.   NovoLOG FlexPen 100 UNIT/ML FlexPen Generic drug: insulin aspart Max daily 45 units per scale   OVER THE COUNTER MEDICATION Potassium supplement (unknown dosage per patient)   Evaristo Bury FlexTouch 100 UNIT/ML FlexTouch Pen Generic drug: insulin degludec Inject 22 Units into the skin daily.   Zinc 50 MG Caps Take 50 mg by mouth daily.         ALLERGIES: Allergies  Allergen Reactions   Codeine     Hyper with vomiting   Onion Diarrhea, Nausea And Vomiting and Other (See Comments)    syncope   Reglan [Metoclopramide]     "pulls muscle to side" "feel paralyzed"      OBJECTIVE:   VITAL SIGNS: BP (!) 142/80 (BP Location: Left Arm, Patient Position: Sitting, Cuff Size: Normal)   Pulse 78   Resp 20   Ht 4\' 11"  (1.499 m)   Wt 147 lb 6.4 oz (66.9 kg)   SpO2 98%   BMI  29.77 kg/m    PHYSICAL EXAM:  General: Pt appears well and is in NAD  Lungs: Clear with good BS bilat   Heart: RRR   Extremities:  Lower extremities - trace pretibial edema.  Patient has been noted with 0.5 cm left shin macular lesion with a  nodularity  Neuro: MS is good with appropriate affect, pt is alert and Ox3    DM foot exam: 12/28/2023  The skin of the feet is intact without sores or ulcerations. The pedal pulses are decreased . The sensation is intact to a screening 5.07, 10 gram monofilament bilaterally   DATA REVIEWED:  Lab Results  Component Value Date   HGBA1C 7.8 (A) 12/28/2023   HGBA1C 6.8 (A) 07/30/2023   HGBA1C 6.7 (A) 12/26/2022     Latest Reference Range & Units 07/08/23 17:12  Sodium 135 - 145 mmol/L 140  Potassium 3.5 - 5.1 mmol/L 4.0  Chloride 98 - 111 mmol/L 100  CO2 22 - 32 mmol/L 32  Glucose 70 - 99 mg/dL 478 (H)  BUN 8 - 23 mg/dL 24 (H)  Creatinine 2.95 - 1.00 mg/dL 6.21 (H)  Calcium 8.9 - 10.3 mg/dL 30.8 (H)  Anion gap 5 - 15  8  GFR, Estimated >60 mL/min 48 (L)     ASSESSMENT / PLAN / RECOMMENDATIONS:   1) Type 1 Diabetes Mellitus,Sub- Optimally controlled, With CKD III and Macrovascular  complications - Most recent A1c of 7.8 %. Goal A1c < 7.0 %.    - A1c has trended up, she did acquire RSV requiring glucocorticoids which resulted in hyperglycemia -Patient has been noted with insulin-carbohydrate mismatch, I we will adjust her NovoLog based on the size of the meal as below -I will decrease her suppertime insulin due to hypoglycemia that was noted after supper -I will also adjust her sensitivity factor from 35 to 40 MEDICATIONS:  Continue  Tresiba 22 units daily Change NovoLog 3-4 units before Breakfast, 3-4 units before Lunch and 4 units before supper  Change Correction factor: NovoLog (BG -130/40)   EDUCATION / INSTRUCTIONS: BG monitoring instructions: Patient is instructed to check her blood sugars 3 times a day, before meals. Call [facility] if: BG persistently < 70  I reviewed the Rule of 15 for the treatment of hypoglycemia in detail with the patient. Literature supplied.   2) Diabetic complications:  Eye: Does not have known diabetic retinopathy.   Neuro/ Feet: Does not have known diabetic peripheral neuropathy. Renal: Patient does  have known baseline CKD. She is  on an ACEI/ARB at present.   3. Skin growth:  Patient has a dermatologist, I have strongly encouraged her to follow-up as soon as possible to rule out any skin cancer  Follow-up in 4 months     Signed electronically by: Lyndle Herrlich, MD  [facility]  [facility] [address] [place] [place], [place] [address] Phone: [phone] FAX: [phone]   CC: Thana Ates, MD [address] [place] [address] Phone: [phone]  Fax: [phone]    Return to Endocrinology clinic as below: Future Appointments  Date Time Provider Department Center  01/02/2024 10:15 AM [facility] None  01/02/2024 10:30 AM Earna Coder, MD [facility] None  05/06/2024 10:30 AM [facility]  05/06/2024 11:40 AM Cephus Shelling, MD [facility]

## 2023-12-28 NOTE — Patient Instructions (Addendum)
-   Continue  Tresiba 22 units once daily  - Change Novolog 3-4 units with Breakfast, 3-4 units with Lunch and  4 units with Supper PLUS additional if needed based on the scale below  -Novolog correctional insulin: ADD extra units on insulin to your meal-time Novolog dose if your blood sugars are higher than 165. Use the scale below to help guide you:   Blood sugar before meal Number of units to inject  Less than 170 0 unit  171 - 210 1 units  211 - 250 2 units  251 - 290 3 units  291 - 330 4 units  331 - 370 5 units     HOW TO TREAT LOW BLOOD SUGARS (Blood sugar LESS THAN 70 MG/DL) Please follow the RULE OF 15 for the treatment of hypoglycemia treatment (when your (blood sugars are less than 70 mg/dL)   STEP 1: Take 15 grams of carbohydrates when your blood sugar is low, which includes:  3-4 GLUCOSE TABS  OR 3-4 OZ OF JUICE OR REGULAR SODA OR ONE TUBE OF GLUCOSE GEL    STEP 2: RECHECK blood sugar in 15 MINUTES STEP 3: If your blood sugar is still low at the 15 minute recheck --> then, go back to STEP 1 and treat AGAIN with another 15 grams of carbohydrates.

## 2023-12-31 ENCOUNTER — Encounter: Payer: Self-pay | Admitting: Internal Medicine

## 2024-01-01 ENCOUNTER — Other Ambulatory Visit: Payer: Self-pay

## 2024-01-01 DIAGNOSIS — C50811 Malignant neoplasm of overlapping sites of right female breast: Secondary | ICD-10-CM

## 2024-01-01 DIAGNOSIS — Z171 Estrogen receptor negative status [ER-]: Secondary | ICD-10-CM

## 2024-01-02 ENCOUNTER — Inpatient Hospital Stay: Payer: Medicare HMO

## 2024-01-02 ENCOUNTER — Encounter: Payer: Self-pay | Admitting: Internal Medicine

## 2024-01-02 ENCOUNTER — Inpatient Hospital Stay: Payer: Medicare HMO | Attending: Internal Medicine | Admitting: Internal Medicine

## 2024-01-02 VITALS — BP 165/63 | HR 72 | Temp 97.1°F | Resp 16 | Ht 59.0 in | Wt 150.2 lb

## 2024-01-02 DIAGNOSIS — Z853 Personal history of malignant neoplasm of breast: Secondary | ICD-10-CM | POA: Insufficient documentation

## 2024-01-02 DIAGNOSIS — Z803 Family history of malignant neoplasm of breast: Secondary | ICD-10-CM | POA: Insufficient documentation

## 2024-01-02 DIAGNOSIS — N183 Chronic kidney disease, stage 3 unspecified: Secondary | ICD-10-CM | POA: Diagnosis not present

## 2024-01-02 DIAGNOSIS — D631 Anemia in chronic kidney disease: Secondary | ICD-10-CM | POA: Diagnosis not present

## 2024-01-02 DIAGNOSIS — Z800 Family history of malignant neoplasm of digestive organs: Secondary | ICD-10-CM | POA: Insufficient documentation

## 2024-01-02 DIAGNOSIS — I2510 Atherosclerotic heart disease of native coronary artery without angina pectoris: Secondary | ICD-10-CM | POA: Insufficient documentation

## 2024-01-02 DIAGNOSIS — Z87891 Personal history of nicotine dependence: Secondary | ICD-10-CM | POA: Diagnosis not present

## 2024-01-02 DIAGNOSIS — D696 Thrombocytopenia, unspecified: Secondary | ICD-10-CM | POA: Diagnosis not present

## 2024-01-02 DIAGNOSIS — Z171 Estrogen receptor negative status [ER-]: Secondary | ICD-10-CM | POA: Diagnosis not present

## 2024-01-02 DIAGNOSIS — C50811 Malignant neoplasm of overlapping sites of right female breast: Secondary | ICD-10-CM | POA: Diagnosis not present

## 2024-01-02 DIAGNOSIS — I129 Hypertensive chronic kidney disease with stage 1 through stage 4 chronic kidney disease, or unspecified chronic kidney disease: Secondary | ICD-10-CM | POA: Diagnosis not present

## 2024-01-02 DIAGNOSIS — Z9013 Acquired absence of bilateral breasts and nipples: Secondary | ICD-10-CM | POA: Diagnosis not present

## 2024-01-02 LAB — CMP (CANCER CENTER ONLY)
ALT: 15 U/L (ref 0–44)
AST: 24 U/L (ref 15–41)
Albumin: 3.9 g/dL (ref 3.5–5.0)
Alkaline Phosphatase: 85 U/L (ref 38–126)
Anion gap: 6 (ref 5–15)
BUN: 28 mg/dL — ABNORMAL HIGH (ref 8–23)
CO2: 26 mmol/L (ref 22–32)
Calcium: 8.7 mg/dL — ABNORMAL LOW (ref 8.9–10.3)
Chloride: 104 mmol/L (ref 98–111)
Creatinine: 1.06 mg/dL — ABNORMAL HIGH (ref 0.44–1.00)
GFR, Estimated: 55 mL/min — ABNORMAL LOW (ref >60)
Glucose, Bld: 86 mg/dL (ref 70–99)
Potassium: 4.4 mmol/L (ref 3.5–5.1)
Sodium: 136 mmol/L (ref 135–145)
Total Bilirubin: 0.6 mg/dL (ref 0.0–1.2)
Total Protein: 7 g/dL (ref 6.5–8.1)

## 2024-01-02 LAB — CBC WITH DIFFERENTIAL (CANCER CENTER ONLY)
Abs Immature Granulocytes: 0.02 10*3/uL (ref 0.00–0.07)
Basophils Absolute: 0 10*3/uL (ref 0.0–0.1)
Basophils Relative: 1 %
Eosinophils Absolute: 0.1 10*3/uL (ref 0.0–0.5)
Eosinophils Relative: 2 %
HCT: 37 % (ref 36.0–46.0)
Hemoglobin: 11.9 g/dL — ABNORMAL LOW (ref 12.0–15.0)
Immature Granulocytes: 0 %
Lymphocytes Relative: 31 %
Lymphs Abs: 2.1 10*3/uL (ref 0.7–4.0)
MCH: 31.3 pg (ref 26.0–34.0)
MCHC: 32.2 g/dL (ref 30.0–36.0)
MCV: 97.4 fL (ref 80.0–100.0)
Monocytes Absolute: 0.7 10*3/uL (ref 0.1–1.0)
Monocytes Relative: 11 %
Neutro Abs: 3.8 10*3/uL (ref 1.7–7.7)
Neutrophils Relative %: 55 %
Platelet Count: 145 10*3/uL — ABNORMAL LOW (ref 150–400)
RBC: 3.8 MIL/uL — ABNORMAL LOW (ref 3.87–5.11)
RDW: 13 % (ref 11.5–15.5)
WBC Count: 6.8 10*3/uL (ref 4.0–10.5)
nRBC: 0 % (ref 0.0–0.2)

## 2024-01-02 NOTE — Progress Notes (Signed)
 [facility] CONSULT NOTE  Patient Care Team: Thana Ates, MD as PCP - General (Internal Medicine) Tonny Bollman, MD as PCP - Structural Heart (Cardiology) Jens Som Madolyn Frieze, MD as PCP - Cardiology (Cardiology) Earna Coder, MD as Consulting Physician (Hematology and Oncology)  CHIEF COMPLAINTS/PURPOSE OF CONSULTATION: breast cancer  #  Oncology History Overview Note  #September 2016-multifocal right breast cancer triple negative [ER/PR HER-2 negative; [facility] Dr.[facility]]-PET-no metastatic disease; dose dense Adriamycin followed by weekly Taxol.  November 05, 2013-right mastectomy- [id number]-complete pathologic response.  status post adjuvant right ax.radiation Left breast cancer- as pt/reocds s/p mastectomy-no malignancy noted.    # DM- on insulin [since 1972]  # SURVIVORSHIP:   # GENETICS: NEG as per pt.   DIAGNOSIS: Right breast cancer  STAGE: early stage        ;  GOALS: cure  CURRENT/MOST RECENT THERAPY : Surveillance      Carcinoma of overlapping sites of right breast in female, estrogen receptor negative (HCC)  12/24/2019 Initial Diagnosis   Carcinoma of overlapping sites of right breast in female, estrogen receptor negative (HCC)      HISTORY OF PRESENTING ILLNESS: Ambulating independently.  Alone.  Molly Hill 73 y.o.  female above history of bilateral triple negative breast cancer is here for follow-up.  Skin cancer removed from lt leg, SCC, Dr. Sebastian Ache.    Denies any new lumps or bumps.  Appetite is good.  No headaches.  No abdominal pain.  No constipation.   Review of Systems  Constitutional:  Positive for malaise/fatigue. Negative for chills, diaphoresis, fever and weight loss.  HENT:  Negative for nosebleeds and sore throat.   Eyes:  Negative for double vision.  Respiratory:  Negative for cough, hemoptysis, sputum production, shortness of breath and wheezing.   Cardiovascular:  Negative for chest pain,  palpitations, orthopnea and leg swelling.  Gastrointestinal:  Negative for abdominal pain, blood in stool, constipation, diarrhea, heartburn, melena, nausea and vomiting.  Genitourinary:  Negative for dysuria, frequency and urgency.  Musculoskeletal:  Positive for joint pain. Negative for back pain.  Skin: Negative.  Negative for itching and Stair.  Neurological:  Negative for dizziness, tingling, focal weakness, weakness and headaches.  Endo/Heme/Allergies:  Does not bruise/bleed easily.  Psychiatric/Behavioral:  Negative for depression. The patient is not nervous/anxious and does not have insomnia.      MEDICAL HISTORY:  Past Medical History:  Diagnosis Date   Alopecia    Anemia    Arthritis    Bicuspid aortic valve    mild to moderate AS by echo 2022   Chronic diastolic (congestive) heart failure (HCC)    Coronary arteriosclerosis in native artery    s/p PCI of LAD in 2013 & NSTEMI with staged DES to pLAD (10/2021)   DM (diabetes mellitus), type 1 (HCC)    GERD (gastroesophageal reflux disease)    History of breast cancer    Estrogen receptor negative. s/p chemo   Hyperglycemia    Hyperlipidemia    Hypertension    Mitral regurgitation    mild to moderate by echo 2020   Myocardial infarction [facility]) 10/09/2021   S/P TAVR (transcatheter aortic valve replacement) 11/08/2021   s/p TAVR with a 23 mm Edwards S3UR via the TF approach by Dr. Excell Seltzer and Dr. Laneta Simmers   Tendinitis of left wrist     SURGICAL HISTORY: Past Surgical History:  Procedure Laterality Date   ANKLE SURGERY Bilateral    CATARACT EXTRACTION, BILATERAL  CHOLECYSTECTOMY     CORONARY PRESSURE/FFR STUDY N/A 10/21/2021   Procedure: INTRAVASCULAR PRESSURE WIRE/FFR STUDY;  Surgeon: Tonny Bollman, MD;  Location[facility] INVASIVE CV LAB;  Service: Cardiovascular;  Laterality: N/A;   CORONARY STENT INTERVENTION     CORONARY STENT INTERVENTION N/A 10/21/2021   Procedure: CORONARY STENT INTERVENTION;  Surgeon: Tonny Bollman, MD;  Location: [facility] INVASIVE CV LAB;  Service: Cardiovascular;  Laterality: N/A;   CORONARY/GRAFT ACUTE MI REVASCULARIZATION N/A 10/10/2021   Procedure: CORONARY/GRAFT ACUTE MI REVASCULARIZATION;  Surgeon: Runell Gess, MD;  Location: [facility] INVASIVE CV LAB;  Service: Cardiovascular;  Laterality: N/A;   CTR     ELBOW SURGERY     HAND SURGERY     BOTH THUMBS AND MIDDLE FINGERS   INTRAOPERATIVE TRANSTHORACIC ECHOCARDIOGRAM N/A 11/08/2021   Procedure: INTRAOPERATIVE TRANSTHORACIC ECHOCARDIOGRAM;  Surgeon: Tonny Bollman, MD;  Location: [facility] INVASIVE CV LAB;  Service: Open Heart Surgery;  Laterality: N/A;   LEFT BREAST MASTECTOMY Bilateral    MASCULAR  REPAIR     PORT-A-CATH REMOVAL     PORTACATH PLACEMENT     RIGHT MODIFIED MASTECTOMY Right    TRANSCATHETER AORTIC VALVE REPLACEMENT, TRANSFEMORAL N/A 11/08/2021   Procedure: TRANSCATHETER AORTIC VALVE REPLACEMENT, TRANSFEMORAL;  Surgeon: Tonny Bollman, MD;  Location: [facility] INVASIVE CV LAB;  Service: Open Heart Surgery;  Laterality: N/A;   UPPER LIP SURGERY      SOCIAL HISTORY: Social History   Socioeconomic History   Marital status: Widowed    Spouse name: Not on file   Number of children: Not on file   Years of education: Not on file   Highest education level: Not on file  Occupational History   Not on file  Tobacco Use   Smoking status: Former    Current packs/day: 0.00    Types: Cigarettes    Start date: 08/14/1976    Quit date: 08/15/1979    Years since quitting: 44.4   Smokeless tobacco: Never   Tobacco comments:    1 pack would last a week.  Vaping Use   Vaping status: Never Used  Substance and Sexual Activity   Alcohol use: Never   Drug use: Never   Sexual activity: Never  Other Topics Concern   Not on file  Social History Narrative   Not on file   Social Drivers of Health   Financial Resource Strain: Not on file  Food Insecurity: Not on file  Transportation Needs: Not on file  Physical Activity: Not on  file  Stress: Not on file  Social Connections: Not on file  Intimate Partner Violence: Not on file    FAMILY HISTORY: Family History  Problem Relation Age of Onset   Heart attack Mother    CAD Father        CABG   Diabetes Father    Stroke Father    Colon cancer Father        74s   Diabetes Brother    Diabetes Paternal Grandfather    Heart attack Paternal Grandfather    Breast cancer Maternal Aunt        26s    ALLERGIES:  is allergic to codeine, onion, and reglan [metoclopramide].  MEDICATIONS:  Current Outpatient Medications  Medication Sig Dispense Refill   acetaminophen (TYLENOL) 500 MG tablet Take 1,000 mg by mouth every 6 (six) hours as needed for moderate pain or headache.     amLODipine (NORVASC) 2.5 MG tablet TAKE 1 TABLET BY MOUTH EVERY DAY 90 tablet 0  amoxicillin (AMOXIL) 500 MG capsule Take 4 capsules by mouth 1 hour prior to dental appointments 4 capsule 3   aspirin EC 81 MG tablet Take 81 mg by mouth at bedtime.     atorvastatin (LIPITOR) 80 MG tablet TAKE 1 TABLET BY MOUTH EVERYDAY AT BEDTIME 90 tablet 3   BIOTIN PO Take by mouth daily.     Biotin w/ Vitamins C & E (HAIR/SKIN/NAILS PO) Take 1 capsule by mouth daily.     bisacodyl (DULCOLAX) 5 MG EC tablet Take 2 tablets (10 mg total) by mouth daily as needed for moderate constipation. 30 tablet 0   Calcium Carb-Cholecalciferol (CALCIUM 600 + D PO) Take 1 tablet by mouth daily.     cholecalciferol (VITAMIN D) 25 MCG (1000 UNIT) tablet Take 1,000 Units by mouth daily.     Continuous Blood Gluc Sensor (DEXCOM G7 SENSOR) MISC 1 Device by Does not apply route every 30 (thirty) days.     docusate sodium (COLACE) 100 MG capsule Take 2 capsules (200 mg total) by mouth daily. 10 capsule 0   fexofenadine (ALLEGRA) 180 MG tablet Take 180 mg by mouth daily as needed for allergies.     fluticasone (FLONASE) 50 MCG/ACT nasal spray Place 1 spray into both nostrils as needed for allergies.     furosemide (LASIX) 40 MG  tablet Take 40 mg by mouth daily as needed.     glucose blood (ACCU-CHEK AVIVA PLUS) test strip Check sugar 4x daily 400 each 2   glucose blood test strip 1 each by Other route 4 (four) times daily -  before meals and at bedtime. Use as instructed     insulin aspart (NOVOLOG FLEXPEN) 100 UNIT/ML FlexPen Max daily 45 units per scale 45 mL 3   insulin degludec (TRESIBA FLEXTOUCH) 100 UNIT/ML FlexTouch Pen Inject 22 Units into the skin daily. 30 mL 0   Insulin Pen Needle 32G X 4 MM MISC 1 Device by Does not apply route in the morning, at noon, in the evening, and at bedtime. 400 each 3   loperamide (IMODIUM A-D) 2 MG tablet Take 1-2 mg by mouth 4 (four) times daily as needed for diarrhea or loose stools.     losartan (COZAAR) 25 MG tablet TAKE 1 TABLET (25 MG TOTAL) BY MOUTH DAILY. 90 tablet 0   meclizine (ANTIVERT) 25 MG tablet Take 1 tablet (25 mg total) by mouth 3 (three) times daily as needed for dizziness. 30 tablet 0   Melatonin 12 MG TABS Take 1 tablet by mouth daily. 1 tab at bedtime     metoprolol succinate (TOPROL XL) 25 MG 24 hr tablet Take 1 tablet (25 mg total) by mouth daily. 90 tablet 3   Naphazoline-Pheniramine (EYE ALLERGY RELIEF OP) Place 1 drop into both eyes daily as needed (allergies).     nitroGLYCERIN (NITROSTAT) 0.4 MG SL tablet Place 1 tablet (0.4 mg total) under the tongue every 5 (five) minutes as needed for chest pain. 25 tablet 3   OVER THE COUNTER MEDICATION Potassium supplement (unknown dosage per patient)     vitamin B-12 (CYANOCOBALAMIN) 1000 MCG tablet Take 1,000 mcg by mouth daily.     vitamin C (ASCORBIC ACID) 500 MG tablet Take 500 mg by mouth daily.     Zinc 50 MG CAPS Take 50 mg by mouth daily.     No current facility-administered medications for this visit.      Marland Kitchen  PHYSICAL EXAMINATION: ECOG PERFORMANCE STATUS: 0 - Asymptomatic  Vitals:  01/02/24 1009 01/02/24 1022  BP: (!) 176/68 (!) 165/63  Pulse: 72   Resp:  16  Temp: (!) 97.1 F (36.2 C)    SpO2: 99%     Filed Weights   01/02/24 1009  Weight: 150 lb 3.2 oz (68.1 kg)     Physical Exam Constitutional:      Comments: Ambulating independently.  Alone.  HENT:     Head: Normocephalic and atraumatic.     Mouth/Throat:     Pharynx: No oropharyngeal exudate.  Eyes:     Pupils: Pupils are equal, round, and reactive to light.  Cardiovascular:     Rate and Rhythm: Normal rate and regular rhythm.  Pulmonary:     Effort: No respiratory distress.     Breath sounds: No wheezing.  Abdominal:     General: Bowel sounds are normal. There is no distension.     Palpations: Abdomen is soft. There is no mass.     Tenderness: There is no abdominal tenderness. There is no guarding or rebound.  Musculoskeletal:        General: No tenderness. Normal range of motion.     Cervical back: Normal range of motion and neck supple.  Skin:    General: Skin is warm.     Comments: Bilateral mastectomies noted.  No lumps or bumps noted.  Positive chest wall telangiectasia.  Neurological:     Mental Status: She is alert and oriented to person, place, and time.  Psychiatric:        Mood and Affect: Affect normal.      LABORATORY DATA:  I have reviewed the data as listed Lab Results  Component Value Date   WBC 6.8 01/02/2024   HGB 11.9 (L) 01/02/2024   HCT 37.0 01/02/2024   MCV 97.4 01/02/2024   PLT 145 (L) 01/02/2024   Recent Labs    07/08/23 1712 01/02/24 1006  NA 140 136  K 4.0 4.4  CL 100 104  CO2 32 26  GLUCOSE 105* 86  BUN 24* 28*  CREATININE 1.20* 1.06*  CALCIUM 10.5* 8.7*  GFRNONAA 48* 55*  PROT  --  7.0  ALBUMIN  --  3.9  AST  --  24  ALT  --  15  ALKPHOS  --  85  BILITOT  --  0.6    RADIOGRAPHIC STUDIES: I have personally reviewed the radiological images as listed and agreed with the findings in the report. No results found.   ASSESSMENT & PLAN:   Carcinoma of overlapping sites of right breast in female, estrogen receptor negative (HCC) # Bilateral  breast cancer triple negative [September 2016]-s/p BIL Mastectomy. No evidence of recurrence.  Continue surveillance.  No mammograms.  Stable.   # Anemia mild hemoglobin 11-12/? Sec to CKD- III- Stable.   # Intermittent thrombocytopenia: -slightly low at 145 [normal is 150].  Question ITP versus others- Stable.   # Bone density: [endocrinologist]- Relcast q 12 months. Stable.   # CKD- stage III- GFR 50 [DM-Endo/HTN]-discussed with patient the importance of optimal control of blood pressure/blood glucose levels.   Stable.   # # CAD/MI [dec, 2022]; TAVR [[place]- Jan 2023]- Stable.   # DISPOSITION:  # Follow up in 12 months MD; labs- cbc/cmp; - Dr.B   All questions were answered. The patient knows to call the clinic with any problems, questions or concerns.    Earna Coder, MD 01/02/2024 10:51 AM

## 2024-01-02 NOTE — Progress Notes (Signed)
 Skin cancer removed from lt leg, SCC, Dr. Sebastian Ache.

## 2024-01-02 NOTE — Assessment & Plan Note (Addendum)
#   Bilateral breast cancer triple negative [September 2016]-s/p BIL Mastectomy. No evidence of recurrence.  Continue surveillance.  No mammograms.  Stable.   # Anemia mild hemoglobin 11-12/? Sec to CKD- III- Stable.   # Intermittent thrombocytopenia: -slightly low at 145 [normal is 150].  Question ITP versus others- Stable.   # Bone density: [endocrinologist]- Relcast q 12 months. Stable.   # CKD- stage III- GFR 50 [DM-Endo/HTN]-discussed with patient the importance of optimal control of blood pressure/blood glucose levels.   Stable.   # # CAD/MI [dec, 2022]; TAVR [[place]- Jan 2023]- Stable.   # DISPOSITION:  # Follow up in 12 months MD; labs- cbc/cmp; - Dr.B

## 2024-01-04 NOTE — Therapy (Signed)
 [place] [facility] [address], [address] [place], [place], [address] Phone: [phone]   Fax:  [phone]  Patient Details  Name: Molly Hill MRN: [id number] Date of Birth: [birth year] Referring Provider:  No ref. provider found  Encounter Date: 01/04/2024 PHYSICAL THERAPY DISCHARGE SUMMARY  Visits from Start of Care: 3  Current functional level related to goals / functional outcomes: Positional dizziness improved per last tx note.  Other outcomes unknown   Remaining deficits: unknown   Education / Equipment: HEP initiated   Patient agrees to discharge. Patient goals were not met. Patient is being discharged due to not returning since the last visit.   Dion Body, PT 01/04/2024, 8:19 AM  [place]  [facility] [address], [address] [place], [place], [address] Phone: [phone]   Fax:  [phone]

## 2024-01-22 ENCOUNTER — Telehealth: Payer: Self-pay | Admitting: Cardiology

## 2024-01-22 MED ORDER — AMOXICILLIN 500 MG PO CAPS: ORAL_CAPSULE | ORAL | 0 refills | Status: DC

## 2024-01-22 NOTE — Telephone Encounter (Signed)
 Patient identification verified by 2 forms. Marilynn Rail, RN    Called and spoke to patient  Patient states:   -was not sure if she needed ABX   -has dental procedure today at 3:00pm   -would like to know if she would also need ABX for dental cleaning Informed Patient:   -due to TAVR, will need to take ABX 1 hour prior to procedures   -this would also include routine dental cleaning   -she will need tot ake 2,000 mg Amoxicillin   -Rx for Amoxicillin sent to preferred pharmacy  Patient verbalized understanding, no questions at this time

## 2024-01-22 NOTE — Telephone Encounter (Signed)
 New Message;     Patient says she is having a root canal today. She wants to know if she needs to take antibiotic before having it? If so she needs something called in asap. She  says usually take Amoxicillin 500 mg 4 tablets 1 hour prior to procedure.

## 2024-01-30 ENCOUNTER — Other Ambulatory Visit: Payer: Self-pay | Admitting: Cardiology

## 2024-02-15 ENCOUNTER — Other Ambulatory Visit: Payer: Self-pay | Admitting: Internal Medicine

## 2024-02-18 ENCOUNTER — Telehealth: Payer: Self-pay | Admitting: Cardiology

## 2024-02-18 MED ORDER — NITROGLYCERIN 0.4 MG SL SUBL: 0.4000 mg | SUBLINGUAL_TABLET | SUBLINGUAL | 0 refills | Status: DC | PRN

## 2024-02-18 NOTE — Telephone Encounter (Signed)
 *  STAT* If patient is at the pharmacy, call can be transferred to refill team.   1. Which medications need to be refilled? (please list name of each medication and dose if known)   nitroGLYCERIN  (NITROSTAT ) 0.4 MG SL tablet     2. Would you like to learn more about the convenience, safety, & potential cost savings by using the [facility]? No      3. Are you open to using the [facility] (Type [facility]. ). No    4. Which pharmacy/location (including street and city if local pharmacy) is medication to be sent to? [id number] - [place], [place] - [address] AT [address] OF [address]    5. Do they need a 30 day or 90 day supply? 1 bottle

## 2024-02-18 NOTE — Telephone Encounter (Signed)
 RX sent to requested Pharmacy

## 2024-02-26 ENCOUNTER — Other Ambulatory Visit: Payer: Self-pay | Admitting: Cardiology

## 2024-03-11 ENCOUNTER — Ambulatory Visit (INDEPENDENT_AMBULATORY_CARE_PROVIDER_SITE_OTHER)

## 2024-03-11 ENCOUNTER — Ambulatory Visit: Admitting: Podiatry

## 2024-03-11 ENCOUNTER — Encounter: Payer: Self-pay | Admitting: Podiatry

## 2024-03-11 DIAGNOSIS — S82891S Other fracture of right lower leg, sequela: Secondary | ICD-10-CM

## 2024-03-11 DIAGNOSIS — M779 Enthesopathy, unspecified: Secondary | ICD-10-CM

## 2024-03-11 DIAGNOSIS — M7752 Other enthesopathy of left foot: Secondary | ICD-10-CM

## 2024-03-11 DIAGNOSIS — S82892S Other fracture of left lower leg, sequela: Secondary | ICD-10-CM

## 2024-03-11 DIAGNOSIS — M7751 Other enthesopathy of right foot: Secondary | ICD-10-CM

## 2024-03-11 DIAGNOSIS — M722 Plantar fascial fibromatosis: Secondary | ICD-10-CM | POA: Diagnosis not present

## 2024-03-11 MED ORDER — MELOXICAM 15 MG PO TABS: 15.0000 mg | ORAL_TABLET | Freq: Every day | ORAL | 3 refills | Status: DC

## 2024-03-11 NOTE — Patient Instructions (Signed)

## 2024-03-12 ENCOUNTER — Other Ambulatory Visit: Payer: Self-pay | Admitting: Cardiology

## 2024-03-12 ENCOUNTER — Encounter: Payer: Self-pay | Admitting: Podiatry

## 2024-03-15 NOTE — Progress Notes (Signed)
  Subjective:  Patient ID: Molly Hill, female    DOB: 01-03-1951,  MRN: [id number]  Chief Complaint  Patient presents with   Foot Pain    "I think I have Plantar Fasciitis.  It hurts on the bottom of my heel and on the side.  I would like him to check my ankles.  They start to stick out more.  I had surgery on them in the past."      73 y.o. female presents with the above complaint. History confirmed with patient.  Has a history of breaking both ankles and ORIF.  Objective:  Physical Exam: warm, good capillary refill, no trophic changes or ulcerative lesions, normal DP and PT pulses, and normal sensory exam. Left Foot: soft tissue swelling noted over the medial ankle Right Foot: soft tissue swelling noted over the medial ankle and point tenderness over the heel pad  No images are attached to the encounter.  Radiographs: Multiple views x-ray of bilateral ankle and right foot: Previous bilateral ankle ORIF, some prominence of the medial malleolar screws, small plantar heel spur right Assessment:   1. Plantar fasciitis   2. Ankle fracture, left, sequela   3. Ankle fracture, right, sequela      Plan:  Patient was evaluated and treated and all questions answered.  Overall her arthritis is limited from her previous ankle injuries.  She does have some tenderness over the areas of hardware.  Could consider removal if this becomes more symptomatic.  Return as needed for this  Discussed the etiology and treatment options for plantar fasciitis including stretching, formal physical therapy, supportive shoegears such as a running shoe or sneaker, pre fabricated orthoses, injection therapy, and oral medications. We also discussed the role of surgical treatment of this for patients who do not improve after exhausting non-surgical treatment options.   -XR reviewed with patient -Educated patient on stretching and icing of the affected limb -Injection delivered to the plantar fascia of the  right foot. -Rx for meloxicam . Educated on use, risks and benefits of the medication  After sterile prep with povidone-iodine solution and alcohol, the right heel was injected with 0.5cc 2% xylocaine  plain, 0.5cc 0.5% marcaine plain, 10mg  triamcinolone acetonide, and 4mg  dexamethasone  was injected along the medial plantar fascia at the insertion on the plantar calcaneus. The patient tolerated the procedure well without complication.   Return in about 4 weeks (around 04/08/2024) for recheck plantar fasciitis.

## 2024-04-02 ENCOUNTER — Other Ambulatory Visit: Payer: Self-pay | Admitting: Cardiology

## 2024-04-08 ENCOUNTER — Encounter (HOSPITAL_COMMUNITY): Payer: Medicare HMO

## 2024-04-08 ENCOUNTER — Ambulatory Visit: Payer: Medicare HMO | Admitting: Vascular Surgery

## 2024-04-08 ENCOUNTER — Ambulatory Visit: Admitting: Podiatry

## 2024-04-21 NOTE — Progress Notes (Signed)
 HPI: Follow-up coronary artery disease and previous TAVR. Patient has a history of PCI of her LAD.  Last catheterization January 2023 showed 90% RPAV 50% RCA, 75% ostial to proximal LAD, patent stent in the LAD; FFR of RCA negative but positive in the LAD.  Patient had PCI of the LAD at that time.  Patient also with history of severe aortic stenosis secondary to bicuspid aortic valve. Patient underwent TAVR with a 23 mm Edwards SAPIEN 3 ultra Resilia THV November 08, 2021.  Echocardiogram February 2024 showed normal LV function, mild left atrial enlargement, mild mitral regurgitation, status post TAVR with mean gradient 8 mmHg and trace aortic insufficiency.  Carotid Dopplers November 2024 showed 1 to 39% bilateral stenosis.  Since last seen the patient has dyspnea with more extreme activities but not with routine activities. It is relieved with rest. It is not associated with chest pain. There is no orthopnea, PND or pedal edema. There is no syncope or palpitations. There is no exertional chest pain.   Current Outpatient Medications  Medication Sig Dispense Refill   acetaminophen  (TYLENOL ) 500 MG tablet Take 1,000 mg by mouth every 6 (six) hours as needed for moderate pain or headache.     amLODipine  (NORVASC ) 2.5 MG tablet TAKE 1 TABLET BY MOUTH EVERY DAY 90 tablet 0   amoxicillin  (AMOXIL ) 500 MG capsule Take 4 capsules by mouth 1 hour prior to dental appointments 4 capsule 0   aspirin  EC 81 MG tablet Take 81 mg by mouth at bedtime.     atorvastatin  (LIPITOR ) 80 MG tablet TAKE 1 TABLET BY MOUTH EVERYDAY AT BEDTIME 90 tablet 3   Biotin  w/ Vitamins C & E (HAIR/SKIN/NAILS PO) Take 1 capsule by mouth daily.     bisacodyl  (DULCOLAX) 5 MG EC tablet Take 2 tablets (10 mg total) by mouth daily as needed for moderate constipation. 30 tablet 0   Calcium  Carb-Cholecalciferol  (CALCIUM  600 + D PO) Take 1 tablet by mouth daily.     cholecalciferol  (VITAMIN D ) 25 MCG (1000 UNIT) tablet Take 1,000 Units by  mouth daily.     Continuous Blood Gluc Sensor (DEXCOM G7 SENSOR) MISC 1 Device by Does not apply route every 30 (thirty) days.     docusate sodium  (COLACE) 100 MG capsule Take 2 capsules (200 mg total) by mouth daily. 10 capsule 0   fexofenadine (ALLEGRA) 180 MG tablet Take 180 mg by mouth daily as needed for allergies.     fluticasone  (FLONASE ) 50 MCG/ACT nasal spray Place 1 spray into both nostrils as needed for allergies.     furosemide  (LASIX ) 40 MG tablet Take 40 mg by mouth daily as needed.     glucose blood (ACCU-CHEK AVIVA PLUS) test strip Check sugar 4x daily 400 each 2   glucose blood test strip 1 each by Other route 4 (four) times daily -  before meals and at bedtime. Use as instructed     insulin  aspart (NOVOLOG  FLEXPEN) 100 UNIT/ML FlexPen Max daily 45 units per scale 45 mL 3   insulin  degludec (TRESIBA  FLEXTOUCH) 100 UNIT/ML FlexTouch Pen INJECT 22 UNITS INTO THE SKIN DAILY. 15 mL 1   Insulin  Pen Needle 32G X 4 MM MISC 1 Device by Does not apply route in the morning, at noon, in the evening, and at bedtime. 400 each 3   loperamide  (IMODIUM  A-D) 2 MG tablet Take 1-2 mg by mouth 4 (four) times daily as needed for diarrhea or loose stools.  losartan  (COZAAR ) 25 MG tablet TAKE 1 TABLET (25 MG TOTAL) BY MOUTH DAILY. 90 tablet 0   meclizine  (ANTIVERT ) 25 MG tablet Take 1 tablet (25 mg total) by mouth 3 (three) times daily as needed for dizziness. 30 tablet 0   meloxicam  (MOBIC ) 15 MG tablet Take 1 tablet (15 mg total) by mouth daily. 30 tablet 3   metoprolol  succinate (TOPROL  XL) 25 MG 24 hr tablet Take 1 tablet (25 mg total) by mouth daily. 90 tablet 3   Naphazoline-Pheniramine (EYE ALLERGY RELIEF OP) Place 1 drop into both eyes daily as needed (allergies).     nitroGLYCERIN  (NITROSTAT ) 0.4 MG SL tablet PLACE 1 TABLET UNDER THE TONGUE EVERY 5 MINUTES AS NEEDED FOR CHEST PAIN. 25 tablet 2   OVER THE COUNTER MEDICATION Potassium supplement (unknown dosage per patient)     vitamin B-12  (CYANOCOBALAMIN ) 1000 MCG tablet Take 1,000 mcg by mouth daily.     vitamin C (ASCORBIC ACID) 500 MG tablet Take 500 mg by mouth daily.     Zinc 50 MG CAPS Take 50 mg by mouth daily.     Melatonin 12 MG TABS Take 1 tablet by mouth daily. 1 tab at bedtime     No current facility-administered medications for this visit.     Past Medical History:  Diagnosis Date   Alopecia    Anemia    Arthritis    Bicuspid aortic valve    mild to moderate AS by echo 2022   Chronic diastolic (congestive) heart failure (HCC)    Coronary arteriosclerosis in native artery    s/p PCI of LAD in 2013 & NSTEMI with staged DES to pLAD (10/2021)   DM (diabetes mellitus), type 1 (HCC)    GERD (gastroesophageal reflux disease)    History of breast cancer    Estrogen receptor negative. s/p chemo   Hyperglycemia    Hyperlipidemia    Hypertension    Mitral regurgitation    mild to moderate by echo 2020   Myocardial infarction [facility]) 10/09/2021   S/P TAVR (transcatheter aortic valve replacement) 11/08/2021   s/p TAVR with a 23 mm Edwards S3UR via the TF approach by Dr. Wonda and Dr. Lucas   Tendinitis of left wrist     Past Surgical History:  Procedure Laterality Date   ANKLE SURGERY Bilateral    CATARACT EXTRACTION, BILATERAL     CHOLECYSTECTOMY     CORONARY PRESSURE/FFR STUDY N/A 10/21/2021   Procedure: INTRAVASCULAR PRESSURE WIRE/FFR STUDY;  Surgeon: Wonda Sharper, MD;  Location: [facility] INVASIVE CV LAB;  Service: Cardiovascular;  Laterality: N/A;   CORONARY STENT INTERVENTION     CORONARY STENT INTERVENTION N/A 10/21/2021   Procedure: CORONARY STENT INTERVENTION;  Surgeon: Wonda Sharper, MD;  Location: [facility] INVASIVE CV LAB;  Service: Cardiovascular;  Laterality: N/A;   CORONARY/GRAFT ACUTE MI REVASCULARIZATION N/A 10/10/2021   Procedure: CORONARY/GRAFT ACUTE MI REVASCULARIZATION;  Surgeon: Court Dorn PARAS, MD;  Location: [facility] INVASIVE CV LAB;  Service: Cardiovascular;  Laterality: N/A;   CTR     ELBOW  SURGERY     HAND SURGERY     BOTH THUMBS AND MIDDLE FINGERS   INTRAOPERATIVE TRANSTHORACIC ECHOCARDIOGRAM N/A 11/08/2021   Procedure: INTRAOPERATIVE TRANSTHORACIC ECHOCARDIOGRAM;  Surgeon: Wonda Sharper, MD;  Location: [facility] INVASIVE CV LAB;  Service: Open Heart Surgery;  Laterality: N/A;   LEFT BREAST MASTECTOMY Bilateral    MASCULAR  REPAIR     PORT-A-CATH REMOVAL     PORTACATH PLACEMENT     RIGHT  MODIFIED MASTECTOMY Right    TRANSCATHETER AORTIC VALVE REPLACEMENT, TRANSFEMORAL N/A 11/08/2021   Procedure: TRANSCATHETER AORTIC VALVE REPLACEMENT, TRANSFEMORAL;  Surgeon: Wonda Sharper, MD;  Location: [facility] INVASIVE CV LAB;  Service: Open Heart Surgery;  Laterality: N/A;   UPPER LIP SURGERY      Social History   Socioeconomic History   Marital status: Widowed    Spouse name: Not on file   Number of children: Not on file   Years of education: Not on file   Highest education level: Not on file  Occupational History   Not on file  Tobacco Use   Smoking status: Former    Current packs/day: 0.00    Types: Cigarettes    Start date: 08/14/1976    Quit date: 08/15/1979    Years since quitting: 44.7   Smokeless tobacco: Never   Tobacco comments:    1 pack would last a week.  Vaping Use   Vaping status: Never Used  Substance and Sexual Activity   Alcohol use: Never   Drug use: Never   Sexual activity: Never  Other Topics Concern   Not on file  Social History Narrative   Not on file   Social Drivers of Health   Financial Resource Strain: Not on file  Food Insecurity: Not on file  Transportation Needs: Not on file  Physical Activity: Not on file  Stress: Not on file  Social Connections: Not on file  Intimate Partner Violence: Not on file    Family History  Problem Relation Age of Onset   Heart attack Mother    CAD Father        CABG   Diabetes Father    Stroke Father    Colon cancer Father        72s   Diabetes Brother    Diabetes Paternal Grandfather    Heart  attack Paternal Grandfather    Breast cancer Maternal Aunt        40s    ROS: no fevers or chills, productive cough, hemoptysis, dysphasia, odynophagia, melena, hematochezia, dysuria, hematuria, Bostrom, seizure activity, orthopnea, PND, pedal edema, claudication. Remaining systems are negative.  Physical Exam: Well-developed well-nourished in no acute distress.  Skin is warm and dry.  HEENT is normal.  Neck is supple.  Chest is clear to auscultation with normal expansion.  Cardiovascular exam is regular rate and rhythm.  1/6 systolic murmur left sternal border.  No diastolic murmur noted. Abdominal exam nontender or distended. No masses palpated. Extremities show no edema. neuro grossly intact  EKG Interpretation Date/Time:  Monday May 05 2024 09:38:17 EDT Ventricular Rate:  65 PR Interval:  170 QRS Duration:  84 QT Interval:  424 QTC Calculation: 440 R Axis:   67  Text Interpretation: Normal sinus rhythm Cannot rule out Septal infarct Confirmed by Pietro Rogue ([id number]) on 05/05/2024 9:46:36 AM    A/P  1 history of TAVR-continue SBE prophylaxis.  Repeat echocardiogram.  2 hypertension-patient's blood pressure is controlled.  Continue present medications.    3 hyperlipidemia-continue statin.    4 coronary artery disease-continue aspirin  and statin.  5 history of mitral stenosis-no evidence on most recent echocardiogram.  Will repeat study.  6 chronic diastolic congestive heart failure-patient is euvolemic on examination.  Continue to follow.  Rogue Pietro, MD

## 2024-05-05 ENCOUNTER — Ambulatory Visit: Attending: Cardiology | Admitting: Cardiology

## 2024-05-05 ENCOUNTER — Ambulatory Visit: Admitting: Internal Medicine

## 2024-05-05 ENCOUNTER — Encounter: Payer: Self-pay | Admitting: Cardiology

## 2024-05-05 VITALS — BP 120/66 | HR 65 | Ht 59.0 in | Wt 146.0 lb

## 2024-05-05 DIAGNOSIS — Z9861 Coronary angioplasty status: Secondary | ICD-10-CM

## 2024-05-05 DIAGNOSIS — E7800 Pure hypercholesterolemia, unspecified: Secondary | ICD-10-CM

## 2024-05-05 DIAGNOSIS — I10 Essential (primary) hypertension: Secondary | ICD-10-CM

## 2024-05-05 DIAGNOSIS — Z952 Presence of prosthetic heart valve: Secondary | ICD-10-CM | POA: Diagnosis not present

## 2024-05-05 DIAGNOSIS — I2510 Atherosclerotic heart disease of native coronary artery without angina pectoris: Secondary | ICD-10-CM | POA: Diagnosis not present

## 2024-05-05 NOTE — Patient Instructions (Signed)

## 2024-05-06 ENCOUNTER — Ambulatory Visit (HOSPITAL_COMMUNITY)
Admission: RE | Admit: 2024-05-06 | Discharge: 2024-05-06 | Disposition: A | Discharge status: Home / Self Care | Payer: Medicare HMO | Source: Ambulatory Visit | Attending: Vascular Surgery | Admitting: Vascular Surgery

## 2024-05-06 ENCOUNTER — Encounter: Payer: Self-pay | Admitting: Vascular Surgery

## 2024-05-06 ENCOUNTER — Ambulatory Visit: Payer: Medicare HMO | Attending: Vascular Surgery | Admitting: Vascular Surgery

## 2024-05-06 VITALS — BP 180/73 | HR 64 | Temp 98.0°F | Resp 18 | Ht 59.0 in | Wt 147.4 lb

## 2024-05-06 DIAGNOSIS — I6523 Occlusion and stenosis of bilateral carotid arteries: Secondary | ICD-10-CM

## 2024-05-06 NOTE — Progress Notes (Signed)
 Patient name: Molly Hill MRN: [id number] DOB: 1951-05-18 Sex: female  REASON FOR CONSULT: Vertigo, abnormal CTA head/neck, f/u carotid surveillance   HPI: Molly Hill is a 73 y.o. female, with history of CHF, type 1 diabetes, hypertension, hyperlipidemia, aortic stenosis status post TAVR that presents for 6 month follow-up for ongoing carotid artery surveillance.    Previously had a CTA head and neck for evaluation of vertigo.  She is doing inner ear exercises after seeing ENT at [facility] and also taking meclizine  with significant improvement.  Patient had a CTA head and neck on 07/08/2023.  This showed significant intracranial disease including severe stenosis in the right supraclinoid ICA as well as moderate stenosis of bilateral cavernous ICAs.  There was concern for 60% right and 70% left carotid stenosis extracranial.  Subsequent carotid duplex on 08/30/2023 showed 1 to 39% stenosis bilaterally.  Past Medical History:  Diagnosis Date   Alopecia    Anemia    Arthritis    Bicuspid aortic valve    mild to moderate AS by echo 2022   Chronic diastolic (congestive) heart failure (HCC)    Coronary arteriosclerosis in native artery    s/p PCI of LAD in 2013 & NSTEMI with staged DES to pLAD (10/2021)   DM (diabetes mellitus), type 1 (HCC)    GERD (gastroesophageal reflux disease)    History of breast cancer    Estrogen receptor negative. s/p chemo   Hyperglycemia    Hyperlipidemia    Hypertension    Mitral regurgitation    mild to moderate by echo 2020   Myocardial infarction [facility]) 10/09/2021   S/P TAVR (transcatheter aortic valve replacement) 11/08/2021   s/p TAVR with a 23 mm Edwards S3UR via the TF approach by Dr. Wonda and Dr. Lucas   Tendinitis of left wrist     Past Surgical History:  Procedure Laterality Date   ANKLE SURGERY Bilateral    CATARACT EXTRACTION, BILATERAL     CHOLECYSTECTOMY     CORONARY PRESSURE/FFR STUDY N/A 10/21/2021   Procedure:  INTRAVASCULAR PRESSURE WIRE/FFR STUDY;  Surgeon: Wonda Sharper, MD;  Location: [facility] INVASIVE CV LAB;  Service: Cardiovascular;  Laterality: N/A;   CORONARY STENT INTERVENTION     CORONARY STENT INTERVENTION N/A 10/21/2021   Procedure: CORONARY STENT INTERVENTION;  Surgeon: Wonda Sharper, MD;  Location: [facility] INVASIVE CV LAB;  Service: Cardiovascular;  Laterality: N/A;   CORONARY/GRAFT ACUTE MI REVASCULARIZATION N/A 10/10/2021   Procedure: CORONARY/GRAFT ACUTE MI REVASCULARIZATION;  Surgeon: Court Dorn PARAS, MD;  Location: [facility] INVASIVE CV LAB;  Service: Cardiovascular;  Laterality: N/A;   CTR     ELBOW SURGERY     HAND SURGERY     BOTH THUMBS AND MIDDLE FINGERS   INTRAOPERATIVE TRANSTHORACIC ECHOCARDIOGRAM N/A 11/08/2021   Procedure: INTRAOPERATIVE TRANSTHORACIC ECHOCARDIOGRAM;  Surgeon: Wonda Sharper, MD;  Location: [facility] INVASIVE CV LAB;  Service: Open Heart Surgery;  Laterality: N/A;   LEFT BREAST MASTECTOMY Bilateral    MASCULAR  REPAIR     PORT-A-CATH REMOVAL     PORTACATH PLACEMENT     RIGHT MODIFIED MASTECTOMY Right    TRANSCATHETER AORTIC VALVE REPLACEMENT, TRANSFEMORAL N/A 11/08/2021   Procedure: TRANSCATHETER AORTIC VALVE REPLACEMENT, TRANSFEMORAL;  Surgeon: Wonda Sharper, MD;  Location: [facility] INVASIVE CV LAB;  Service: Open Heart Surgery;  Laterality: N/A;   UPPER LIP SURGERY      Family History  Problem Relation Age of Onset   Heart attack Mother    CAD Father  CABG   Diabetes Father    Stroke Father    Colon cancer Father        21s   Diabetes Brother    Diabetes Paternal Grandfather    Heart attack Paternal Grandfather    Breast cancer Maternal Aunt        48s    SOCIAL HISTORY: Social History   Socioeconomic History   Marital status: Widowed    Spouse name: Not on file   Number of children: Not on file   Years of education: Not on file   Highest education level: Not on file  Occupational History   Not on file  Tobacco Use   Smoking status: Former     Current packs/day: 0.00    Types: Cigarettes    Start date: 08/14/1976    Quit date: 08/15/1979    Years since quitting: 44.7   Smokeless tobacco: Never   Tobacco comments:    1 pack would last a week.  Vaping Use   Vaping status: Never Used  Substance and Sexual Activity   Alcohol use: Never   Drug use: Never   Sexual activity: Never  Other Topics Concern   Not on file  Social History Narrative   Not on file   Social Drivers of Health   Financial Resource Strain: Not on file  Food Insecurity: Not on file  Transportation Needs: Not on file  Physical Activity: Not on file  Stress: Not on file  Social Connections: Not on file  Intimate Partner Violence: Not on file    Allergies  Allergen Reactions   Codeine      Hyper with vomiting   Onion Diarrhea, Nausea And Vomiting and Other (See Comments)    syncope   Reglan [Metoclopramide]     pulls muscle to side feel paralyzed    Current Outpatient Medications  Medication Sig Dispense Refill   acetaminophen  (TYLENOL ) 500 MG tablet Take 1,000 mg by mouth every 6 (six) hours as needed for moderate pain or headache.     amLODipine  (NORVASC ) 2.5 MG tablet TAKE 1 TABLET BY MOUTH EVERY DAY 90 tablet 0   amoxicillin  (AMOXIL ) 500 MG capsule Take 4 capsules by mouth 1 hour prior to dental appointments 4 capsule 0   aspirin  EC 81 MG tablet Take 81 mg by mouth at bedtime.     atorvastatin  (LIPITOR ) 80 MG tablet TAKE 1 TABLET BY MOUTH EVERYDAY AT BEDTIME 90 tablet 3   Biotin  w/ Vitamins C & E (HAIR/SKIN/NAILS PO) Take 1 capsule by mouth daily.     bisacodyl  (DULCOLAX) 5 MG EC tablet Take 2 tablets (10 mg total) by mouth daily as needed for moderate constipation. 30 tablet 0   Calcium  Carb-Cholecalciferol  (CALCIUM  600 + D PO) Take 1 tablet by mouth daily.     cholecalciferol  (VITAMIN D ) 25 MCG (1000 UNIT) tablet Take 1,000 Units by mouth daily.     Continuous Blood Gluc Sensor (DEXCOM G7 SENSOR) MISC 1 Device by Does not apply route  every 30 (thirty) days.     docusate sodium  (COLACE) 100 MG capsule Take 2 capsules (200 mg total) by mouth daily. 10 capsule 0   fexofenadine (ALLEGRA) 180 MG tablet Take 180 mg by mouth daily as needed for allergies.     fluticasone  (FLONASE ) 50 MCG/ACT nasal spray Place 1 spray into both nostrils as needed for allergies.     furosemide  (LASIX ) 40 MG tablet Take 40 mg by mouth daily as needed.  glucose blood (ACCU-CHEK AVIVA PLUS) test strip Check sugar 4x daily 400 each 2   glucose blood test strip 1 each by Other route 4 (four) times daily -  before meals and at bedtime. Use as instructed     insulin  aspart (NOVOLOG  FLEXPEN) 100 UNIT/ML FlexPen Max daily 45 units per scale 45 mL 3   insulin  degludec (TRESIBA  FLEXTOUCH) 100 UNIT/ML FlexTouch Pen INJECT 22 UNITS INTO THE SKIN DAILY. 15 mL 1   Insulin  Pen Needle 32G X 4 MM MISC 1 Device by Does not apply route in the morning, at noon, in the evening, and at bedtime. 400 each 3   loperamide  (IMODIUM  A-D) 2 MG tablet Take 1-2 mg by mouth 4 (four) times daily as needed for diarrhea or loose stools.     losartan  (COZAAR ) 25 MG tablet TAKE 1 TABLET (25 MG TOTAL) BY MOUTH DAILY. 90 tablet 0   meclizine  (ANTIVERT ) 25 MG tablet Take 1 tablet (25 mg total) by mouth 3 (three) times daily as needed for dizziness. 30 tablet 0   meloxicam  (MOBIC ) 15 MG tablet Take 1 tablet (15 mg total) by mouth daily. 30 tablet 3   metoprolol  succinate (TOPROL  XL) 25 MG 24 hr tablet Take 1 tablet (25 mg total) by mouth daily. 90 tablet 3   Naphazoline-Pheniramine (EYE ALLERGY RELIEF OP) Place 1 drop into both eyes daily as needed (allergies).     nitroGLYCERIN  (NITROSTAT ) 0.4 MG SL tablet PLACE 1 TABLET UNDER THE TONGUE EVERY 5 MINUTES AS NEEDED FOR CHEST PAIN. 25 tablet 2   OVER THE COUNTER MEDICATION Potassium supplement (unknown dosage per patient)     vitamin B-12 (CYANOCOBALAMIN ) 1000 MCG tablet Take 1,000 mcg by mouth daily.     vitamin C (ASCORBIC ACID) 500 MG  tablet Take 500 mg by mouth daily.     Zinc 50 MG CAPS Take 50 mg by mouth daily.     No current facility-administered medications for this visit.    REVIEW OF SYSTEMS:  [X]  denotes positive finding, [ ]  denotes negative finding Cardiac  Comments:  Chest pain or chest pressure:    Shortness of breath upon exertion:    Short of breath when lying flat:    Irregular heart rhythm:        Vascular    Pain in calf, thigh, or hip brought on by ambulation:    Pain in feet at night that wakes you up from your sleep:     Blood clot in your veins:    Leg swelling:         Pulmonary    Oxygen at home:    Productive cough:     Wheezing:         Neurologic    Sudden weakness in arms or legs:     Sudden numbness in arms or legs:     Sudden onset of difficulty speaking or slurred speech:    Temporary loss of vision in one eye:     Problems with dizziness:         Gastrointestinal    Blood in stool:     Vomited blood:         Genitourinary    Burning when urinating:     Blood in urine:        Psychiatric    Major depression:         Hematologic    Bleeding problems:    Problems with blood clotting too easily:  Skin    Rashes or ulcers:        Constitutional    Fever or chills:      PHYSICAL EXAM: Vitals:   05/06/24 1122 05/06/24 1125  BP: (!) 180/77 (!) 180/73  Pulse: 64   Resp: 18   Temp: 98 F (36.7 C)   TempSrc: Temporal   SpO2: 99%   Weight: 147 lb 6.4 oz (66.9 kg)   Height: 4' 11 (1.499 m)     GENERAL: The patient is a well-nourished female, in no acute distress. The vital signs are documented above. CARDIAC: There is a regular rate and rhythm.  PULMONARY: No respiratory distress. ABDOMEN: Soft and non-tender.. MUSCULOSKELETAL: There are no major deformities or cyanosis. NEUROLOGIC: No focal weakness or paresthesias are detected.  Cranial nerves II through XII grossly intact. SKIN: There are no ulcers or rashes noted. PSYCHIATRIC: The patient has  a normal affect.  DATA:   Carotid duplex today shows 1 to 39% stenosis bilaterally by velocity criteria (this is consistent with her prior imaging 6 months ago)  CTA head and neck 07/08/2023  IMPRESSION: 1. No acute intracranial process. 2. No intracranial large vessel occlusion. Severe stenosis in the proximal right supraclinoid ICA, moderate stenosis in the bilateral cavernous ICA and left supraclinoid ICA, and moderate stenosis in the proximal right PCA. 3. 70% stenosis in the distal left CCA and 50% stenosis in the proximal left ICA. 4. 60% stenosis in the proximal right ICA and 50% stenosis in the distal right CCA. 5. No hemodynamically significant stenosis in the vertebral arteries. 6. Aortic atherosclerosis.   Aortic Atherosclerosis (ICD10-I70.0).     Electronically Signed   By: Donald Campion M.D.   On: 07/08/2023 19:25  Assessment/Plan:  73 y.o. female, with history of CHF, type 1 diabetes, hypertension, hyperlipidemia, aortic stenosis status post TAVR that presents for 6 month follow-up of her carotid artery disease.   Previously had CTA head and neck for evaluation of vertigo.  Fortunately her vertigo is much improved after evaluation by ENT with inner ear exercises and meclizine .  Again discussed that her CT findings in the past mostly showed intracranial disease that would be best managed with medical therapy and she is on aspirin  and statin.  Her extra cranial carotid disease is less than 80% and there is no indication for surgical intervention for asymptomatic carotid disease unless this is greater than 80%.  Duplex today again shows a 1 to 39% stenosis bilaterally and I personally think her prior CTA neck shows a less than 50% stenosis in the extra-cranial carotids by my review.  Will follow-up in 1 year with repeat carotid ultrasound.    Lonni DOROTHA Gaskins, MD [facility] Office: [phone]

## 2024-05-07 ENCOUNTER — Ambulatory Visit: Admitting: Internal Medicine

## 2024-05-08 ENCOUNTER — Encounter: Payer: Self-pay | Admitting: Internal Medicine

## 2024-05-08 ENCOUNTER — Ambulatory Visit: Admitting: Internal Medicine

## 2024-05-08 VITALS — BP 124/80 | HR 71 | Ht 59.0 in | Wt 148.0 lb

## 2024-05-08 DIAGNOSIS — N1831 Chronic kidney disease, stage 3a: Secondary | ICD-10-CM

## 2024-05-08 DIAGNOSIS — E1022 Type 1 diabetes mellitus with diabetic chronic kidney disease: Secondary | ICD-10-CM

## 2024-05-08 DIAGNOSIS — E1065 Type 1 diabetes mellitus with hyperglycemia: Secondary | ICD-10-CM | POA: Diagnosis not present

## 2024-05-08 DIAGNOSIS — E1059 Type 1 diabetes mellitus with other circulatory complications: Secondary | ICD-10-CM | POA: Diagnosis not present

## 2024-05-08 LAB — POCT GLYCOSYLATED HEMOGLOBIN (HGB A1C): Hemoglobin A1C: 7.6 % — AB (ref 4.0–5.6)

## 2024-05-08 MED ORDER — NOVOLOG FLEXPEN 100 UNIT/ML ~~LOC~~ SOPN: PEN_INJECTOR | SUBCUTANEOUS | 3 refills | Status: AC

## 2024-05-08 MED ORDER — INSULIN PEN NEEDLE 32G X 4 MM MISC: 1.0000 | Freq: Four times a day (QID) | 3 refills | Status: AC

## 2024-05-08 MED ORDER — TRESIBA FLEXTOUCH 100 UNIT/ML ~~LOC~~ SOPN: 20.0000 [IU] | PEN_INJECTOR | Freq: Every day | SUBCUTANEOUS | 3 refills | Status: AC

## 2024-05-08 NOTE — Patient Instructions (Signed)
-   Decrease Tresiba  20 units once daily  - Change Novolog  3-4 units with Breakfast, 5 units with Lunch and  6 units with Supper PLUS additional if needed based on the scale below  -Novolog  correctional insulin : ADD extra units on insulin  to your meal-time Novolog  dose if your blood sugars are higher than 165. Use the scale below to help guide you:   Blood sugar before meal Number of units to inject  Less than 170 0 unit  171 - 210 1 units  211 - 250 2 units  251 - 290 3 units  291 - 330 4 units  331 - 370 5 units     HOW TO TREAT LOW BLOOD SUGARS (Blood sugar LESS THAN 70 MG/DL) Please follow the RULE OF 15 for the treatment of hypoglycemia treatment (when your (blood sugars are less than 70 mg/dL)   STEP 1: Take 15 grams of carbohydrates when your blood sugar is low, which includes:  3-4 GLUCOSE TABS  OR 3-4 OZ OF JUICE OR REGULAR SODA OR ONE TUBE OF GLUCOSE GEL    STEP 2: RECHECK blood sugar in 15 MINUTES STEP 3: If your blood sugar is still low at the 15 minute recheck --> then, go back to STEP 1 and treat AGAIN with another 15 grams of carbohydrates.

## 2024-05-08 NOTE — Progress Notes (Signed)
 Name: Molly Hill  MRN/ DOB: [id number], 10-19-1950   Age/ Sex: 73 y.o., female    PCP: Dwight Trula SQUIBB, MD   Reason for Endocrinology Evaluation: Type 2 Diabetes Mellitus     Date of Initial Endocrinology Visit: 02/15/2022    PATIENT IDENTIFIER: Molly Hill is a 73 y.o. female with a past medical history of T2DM, S/P TAVR, CAD, Hx of Breast ca (Dx 2016) . The patient presented for initial endocrinology clinic visit on 02/15/2022 for consultative assistance with her diabetes management.    HPI: Molly Hill was   Diagnosed with DM at age 1  Hemoglobin A1c 7.8%     On her initial visit to uor clinic, her A1c was 8.0 %, she was on regular insulin  and NPH. We switched her to insulin  analogues and adjusted the doses , dexcom was faxed to DME  SUBJECTIVE:   During the last visit (12/28/2023): A1c 7.8%    Today (05/08/24): Molly Hill is here for a follow up on diabetes management. She  checks her blood sugars multiple times daily. The patient has had hypoglycemic episodes since the last clinic visit. The patient is  symptomatic with these episodes.   She had a follow-up with oncology due to history of right breast cancer that was diagnosed in 2021. She continues to follow-up with cardiology s/p TAVR and CAD/CHF She was seen by podiatry 02/2024  She follows with vascular surgery for significant intracranial disease including severe stenosis in the right internal carotid artery as well as bilateral stenosis of bilateral cavernous ICAs, she is on medical management  Denies nausea, vomiting  Denies constipation or diarrhea     HOME DIABETES REGIMEN: Tresiba  22 units daily  Novolog  3-4 units before Breakfast, 3-4 units before Lunch and 4 units before supper CF: NOvolog  (BG -130/40)     Statin: yes ACE-I/ARB: yes    CONTINUOUS GLUCOSE MONITORING RECORD INTERPRETATION    Dates of Recording:7/11-7/24/2024  Sensor description:dexcom G7  Results statistics:   CGM use % of  time 94  Average and SD 201/76  Time in range 43 %  % Time Above 180 30  % Time above 250 26  % Time Below target 1   Glycemic patterns summary: BGs are high throughout the day and night  Hyperglycemic episodes postprandial  Hypoglycemic episodes occurred with a bolus  Overnight periods: Variable without a pattern    DIABETIC COMPLICATIONS: Microvascular complications:  CKD III  Denies: Retinopathy, neuropathy  Last eye exam: Completed 01/2022 ( Dr. Octavia)  Macrovascular complications:  CAD Denies:  PVD, CVA   PAST HISTORY: Past Medical History:  Past Medical History:  Diagnosis Date   Alopecia    Anemia    Arthritis    Bicuspid aortic valve    mild to moderate AS by echo 2022   Chronic diastolic (congestive) heart failure (HCC)    Coronary arteriosclerosis in native artery    s/p PCI of LAD in 2013 & NSTEMI with staged DES to pLAD (10/2021)   DM (diabetes mellitus), type 1 (HCC)    GERD (gastroesophageal reflux disease)    History of breast cancer    Estrogen receptor negative. s/p chemo   Hyperglycemia    Hyperlipidemia    Hypertension    Mitral regurgitation    mild to moderate by echo 2020   Myocardial infarction [facility]) 10/09/2021   S/P TAVR (transcatheter aortic valve replacement) 11/08/2021   s/p TAVR with a 23 mm Edwards S3UR via the TF  approach by Dr. Wonda and Dr. Lucas   Tendinitis of left wrist    Past Surgical History:  Past Surgical History:  Procedure Laterality Date   ANKLE SURGERY Bilateral    CATARACT EXTRACTION, BILATERAL     CHOLECYSTECTOMY     CORONARY PRESSURE/FFR STUDY N/A 10/21/2021   Procedure: INTRAVASCULAR PRESSURE WIRE/FFR STUDY;  Surgeon: Wonda Sharper, MD;  Location: [facility] INVASIVE CV LAB;  Service: Cardiovascular;  Laterality: N/A;   CORONARY STENT INTERVENTION     CORONARY STENT INTERVENTION N/A 10/21/2021   Procedure: CORONARY STENT INTERVENTION;  Surgeon: Wonda Sharper, MD;  Location: [facility] INVASIVE CV LAB;  Service:  Cardiovascular;  Laterality: N/A;   CORONARY/GRAFT ACUTE MI REVASCULARIZATION N/A 10/10/2021   Procedure: CORONARY/GRAFT ACUTE MI REVASCULARIZATION;  Surgeon: Court Dorn PARAS, MD;  Location: [facility] INVASIVE CV LAB;  Service: Cardiovascular;  Laterality: N/A;   CTR     ELBOW SURGERY     HAND SURGERY     BOTH THUMBS AND MIDDLE FINGERS   INTRAOPERATIVE TRANSTHORACIC ECHOCARDIOGRAM N/A 11/08/2021   Procedure: INTRAOPERATIVE TRANSTHORACIC ECHOCARDIOGRAM;  Surgeon: Wonda Sharper, MD;  Location: [facility] INVASIVE CV LAB;  Service: Open Heart Surgery;  Laterality: N/A;   LEFT BREAST MASTECTOMY Bilateral    MASCULAR  REPAIR     PORT-A-CATH REMOVAL     PORTACATH PLACEMENT     RIGHT MODIFIED MASTECTOMY Right    TRANSCATHETER AORTIC VALVE REPLACEMENT, TRANSFEMORAL N/A 11/08/2021   Procedure: TRANSCATHETER AORTIC VALVE REPLACEMENT, TRANSFEMORAL;  Surgeon: Wonda Sharper, MD;  Location: [facility] INVASIVE CV LAB;  Service: Open Heart Surgery;  Laterality: N/A;   UPPER LIP SURGERY      Social History:  reports that she quit smoking about 44 years ago. Her smoking use included cigarettes. She started smoking about 47 years ago. She has never used smokeless tobacco. She reports that she does not drink alcohol and does not use drugs. Family History:  Family History  Problem Relation Age of Onset   Heart attack Mother    CAD Father        CABG   Diabetes Father    Stroke Father    Colon cancer Father        55s   Diabetes Brother    Diabetes Paternal Grandfather    Heart attack Paternal Grandfather    Breast cancer Maternal Aunt        70s     HOME MEDICATIONS: Allergies as of 05/08/2024       Reactions   Codeine     Hyper with vomiting   Onion Diarrhea, Nausea And Vomiting, Other (See Comments)   syncope   Reglan [metoclopramide]    pulls muscle to side feel paralyzed        Medication List        Accurate as of May 08, 2024  8:58 AM. If you have any questions, ask your nurse or doctor.           acetaminophen  500 MG tablet Commonly known as: TYLENOL  Take 1,000 mg by mouth every 6 (six) hours as needed for moderate pain or headache.   amLODipine  2.5 MG tablet Commonly known as: NORVASC  TAKE 1 TABLET BY MOUTH EVERY DAY   amoxicillin  500 MG capsule Commonly known as: AMOXIL  Take 4 capsules by mouth 1 hour prior to dental appointments   ascorbic acid 500 MG tablet Commonly known as: VITAMIN C Take 500 mg by mouth daily.   aspirin  EC 81 MG tablet Take 81 mg by mouth at bedtime.  atorvastatin  80 MG tablet Commonly known as: LIPITOR  TAKE 1 TABLET BY MOUTH EVERYDAY AT BEDTIME   bisacodyl  5 MG EC tablet Generic drug: bisacodyl  Take 2 tablets (10 mg total) by mouth daily as needed for moderate constipation.   CALCIUM  600 + D PO Take 1 tablet by mouth daily.   cholecalciferol  25 MCG (1000 UNIT) tablet Commonly known as: VITAMIN D3 Take 1,000 Units by mouth daily.   cyanocobalamin  1000 MCG tablet Commonly known as: VITAMIN B12 Take 1,000 mcg by mouth daily.   Dexcom G7 Sensor Misc 1 Device by Does not apply route every 30 (thirty) days.   docusate sodium  100 MG capsule Commonly known as: COLACE Take 2 capsules (200 mg total) by mouth daily.   EYE ALLERGY RELIEF OP Place 1 drop into both eyes daily as needed (allergies).   fexofenadine 180 MG tablet Commonly known as: ALLEGRA Take 180 mg by mouth daily as needed for allergies.   fluticasone  50 MCG/ACT nasal spray Commonly known as: FLONASE  Place 1 spray into both nostrils as needed for allergies.   furosemide  40 MG tablet Commonly known as: LASIX  Take 40 mg by mouth daily as needed.   glucose blood test strip 1 each by Other route 4 (four) times daily -  before meals and at bedtime. Use as instructed   Accu-Chek Aviva Plus test strip Generic drug: glucose blood Check sugar 4x daily   HAIR/SKIN/NAILS PO Take 1 capsule by mouth daily.   Insulin  Pen Needle 32G X 4 MM Misc 1 Device by  Does not apply route in the morning, at noon, in the evening, and at bedtime.   loperamide  2 MG tablet Commonly known as: IMODIUM  A-D Take 1-2 mg by mouth 4 (four) times daily as needed for diarrhea or loose stools.   losartan  25 MG tablet Commonly known as: COZAAR  TAKE 1 TABLET (25 MG TOTAL) BY MOUTH DAILY.   meclizine  25 MG tablet Commonly known as: ANTIVERT  Take 1 tablet (25 mg total) by mouth 3 (three) times daily as needed for dizziness.   meloxicam  15 MG tablet Commonly known as: Mobic  Take 1 tablet (15 mg total) by mouth daily.   metoprolol  succinate 25 MG 24 hr tablet Commonly known as: Toprol  XL Take 1 tablet (25 mg total) by mouth daily.   nitroGLYCERIN  0.4 MG SL tablet Commonly known as: NITROSTAT  PLACE 1 TABLET UNDER THE TONGUE EVERY 5 MINUTES AS NEEDED FOR CHEST PAIN.   NovoLOG  FlexPen 100 UNIT/ML FlexPen Generic drug: insulin  aspart Max daily 45 units per scale   OVER THE COUNTER MEDICATION Potassium supplement (unknown dosage per patient)   Tresiba  FlexTouch 100 UNIT/ML FlexTouch Pen Generic drug: insulin  degludec INJECT 22 UNITS INTO THE SKIN DAILY.   Zinc 50 MG Caps Take 50 mg by mouth daily.         ALLERGIES: Allergies  Allergen Reactions   Codeine      Hyper with vomiting   Onion Diarrhea, Nausea And Vomiting and Other (See Comments)    syncope   Reglan [Metoclopramide]     pulls muscle to side feel paralyzed      OBJECTIVE:   VITAL SIGNS: BP 124/80 (BP Location: Left Arm, Patient Position: Sitting, Cuff Size: Normal)   Pulse 71   Ht 4' 11 (1.499 m)   Wt 148 lb (67.1 kg)   SpO2 97%   BMI 29.89 kg/m    PHYSICAL EXAM:  General: Pt appears well and is in NAD  Lungs: Clear with good BS  bilat   Heart: RRR   Extremities:  Lower extremities - trace pretibial edema.  Patient has been noted with 0.5 cm left shin macular lesion with a nodularity  Neuro: MS is good with appropriate affect, pt is alert and Ox3    DM foot exam:  03/11/2024 per podiatry    DATA REVIEWED:  Lab Results  Component Value Date   HGBA1C 7.8 (A) 12/28/2023   HGBA1C 6.8 (A) 07/30/2023   HGBA1C 6.7 (A) 12/26/2022    Latest Reference Range & Units 01/02/24 10:06  Sodium 135 - 145 mmol/L 136  Potassium 3.5 - 5.1 mmol/L 4.4  Chloride 98 - 111 mmol/L 104  CO2 22 - 32 mmol/L 26  Glucose 70 - 99 mg/dL 86  BUN 8 - 23 mg/dL 28 (H)  Creatinine 9.55 - 1.00 mg/dL 8.93 (H)  Calcium  8.9 - 10.3 mg/dL 8.7 (L)  Anion gap 5 - 15  6  Alkaline Phosphatase 38 - 126 U/L 85  Albumin 3.5 - 5.0 g/dL 3.9  AST 15 - 41 U/L 24  ALT 0 - 44 U/L 15  Total Protein 6.5 - 8.1 g/dL 7.0  Total Bilirubin 0.0 - 1.2 mg/dL 0.6  GFR, Est Non African American >60 mL/min 55 (L)   ASSESSMENT / PLAN / RECOMMENDATIONS:   1) Type 1 Diabetes Mellitus,Sub- Optimally controlled, With CKD III and Macrovascular  complications - Most recent A1c of 7.6 %. Goal A1c < 7.0 %.    - A1c is trending down but continues to be above goal - She has been noted with hypoglycemia after breakfast, patient attributes this to exercise, patient advised to take NovoLog  3 units with breakfast on days she will exercise, and take 4 units with breakfast on the days that she is not going to exercise - I will also increase her lunch and dinnertime as below, but the patient was advised that if she is going to skip carbohydrates for a meal or reduce the amount of carbohydrates drastically for a meal she should reduce the amount of NovoLog  by 50%, for example last night she only had vegetables for dinner and took a regular NovoLog  4 units, luckily she did not have any hypoglycemia but we used this example regarding education and to prevent insulin -carbohydrate mismatch - Patient has been noted with variable hyperglycemia overnight, patient tends to eat crackers and milk at bedtime to prevent hypoglycemia?,  No hypoglycemia overnight, patient was advised to discontinue snacks at bedtime - Will decrease  Tresiba  - Patient declines insulin  pump technology at this time  MEDICATIONS:  Decrease Tresiba  20 units daily Change NovoLog  3-4 units before Breakfast, 5 units before Lunch and 6 units before supper  Continue Correction factor: NovoLog  (BG -130/40)   EDUCATION / INSTRUCTIONS: BG monitoring instructions: Patient is instructed to check her blood sugars 3 times a day, before meals. Call [facility] if: BG persistently < 70  I reviewed the Rule of 15 for the treatment of hypoglycemia in detail with the patient. Literature supplied.   2) Diabetic complications:  Eye: Does not have known diabetic retinopathy.  Neuro/ Feet: Does not have known diabetic peripheral neuropathy. Renal: Patient does  have known baseline CKD. She is  on an ACEI/ARB at present.   Follow-up in 6 months   I spent 25 minutes preparing to see the patient by review of recent labs, imaging and procedures, obtaining and reviewing separately obtained history, communicating with the patient, ordering medications, tests or procedures, and documenting clinical information  in the EHR including the differential Dx, treatment, and any further evaluation and other management    Signed electronically by: Stefano Redgie Butts, MD  [facility] End[facility]ology  [facility] [address] [place] [place], [place] [address] Phone: [phone] FAX: [phone]   CC: Dwight Trula SQUIBB, MD [address] [place] [address] Phone: [phone]  Fax: [phone]    Return to Endocrinology clinic as below: Future Appointments  Date Time Provider Department Center  06/11/2024  9:15 AM [facility] 1 [facility]  12/31/2024 10:00 AM [facility] None  12/31/2024 10:15 AM Rennie Cindy SAUNDERS, MD [facility] None

## 2024-05-09 ENCOUNTER — Encounter: Payer: Self-pay | Admitting: Internal Medicine

## 2024-06-11 ENCOUNTER — Ambulatory Visit: Payer: Self-pay | Admitting: Cardiology

## 2024-06-11 ENCOUNTER — Ambulatory Visit (HOSPITAL_COMMUNITY)
Admission: RE | Admit: 2024-06-11 | Discharge: 2024-06-11 | Disposition: A | Discharge status: Home / Self Care | Source: Ambulatory Visit | Attending: Cardiology | Admitting: Cardiology

## 2024-06-11 DIAGNOSIS — I359 Nonrheumatic aortic valve disorder, unspecified: Secondary | ICD-10-CM

## 2024-06-11 DIAGNOSIS — Z952 Presence of prosthetic heart valve: Secondary | ICD-10-CM | POA: Diagnosis present

## 2024-06-11 LAB — ECHOCARDIOGRAM COMPLETE
AV Mean grad: 6 mmHg
AV Peak grad: 11 mmHg
Ao pk vel: 1.66 m/s
Area-P 1/2: 2.64 cm2
S' Lateral: 2.3 cm

## 2024-06-13 ENCOUNTER — Ambulatory Visit: Admitting: Podiatry

## 2024-06-13 DIAGNOSIS — M25572 Pain in left ankle and joints of left foot: Secondary | ICD-10-CM | POA: Diagnosis not present

## 2024-06-13 MED ORDER — TRIAMCINOLONE ACETONIDE 10 MG/ML IJ SUSP
10.0000 mg | Freq: Once | INTRAMUSCULAR | Status: AC
  Administered 2024-06-13: 10 mg

## 2024-06-13 NOTE — Progress Notes (Signed)
 Patient with planes today of pain mostly in the first metatarsal phalangeal joint.  She also notes some pain in the mid medial foot.  Says at worst is at the first metatarsal phalangeal joint.  Says the ankle pain has resolved.  Has not noticed any redness in the foot or ecchymosis.  Does not recall any injury.   Physical exam:  General appearance: Pleasant, and in no acute distress. AOx3.  Vascular: Pedal pulses: DP 2/4 bilaterally, PT 2/4 bilaterally.  Mild edema lower legs bilaterally. Capillary fill time immediate.  Neurological: Grossly intact bilaterally  Dermatologic:   Skin normal temperature bilaterally.  Skin normal color, tone, and texture bilaterally.   Musculoskeletal: Tenderness palpation and range of motion around first metatarsal phalangeal joint especially dorsally the left.  Some tenderness at the first TMT.  No pain in ankle joints or subtalar joints bilaterally.   Diagnosis: 1.  Arthralgia first metatarsal phalangeal joint left  Plan: -Recommend icing 20 minutes an hour several times a day.  Wear good supportive shoes. -injected 3cc 2:1 mixture 0.5 cc Marcaine:Kenolog 10mg /67ml at first metatarsal phalangeal joint left.    Return 2 weeks follow-up injection first MTP left

## 2024-06-20 ENCOUNTER — Telehealth: Payer: Self-pay | Admitting: Cardiology

## 2024-06-20 NOTE — Telephone Encounter (Signed)
   Pt c/o of Chest Pain: STAT if active CP, including tightness, pressure, jaw pain, radiating pain to shoulder/upper arm/back, CP unrelieved by Nitro. Symptoms reported of SOB, nausea, vomiting, sweating.  1. Are you having CP right now? No    2. Are you experiencing any other symptoms (ex. SOB, nausea, vomiting, sweating)? Sweating, nauseous    3. Is your CP continuous or coming and going? Come and go - chest tightening   4. Have you taken Nitroglycerin ? Yes - 2 last night   5. How long have you been experiencing CP? Last  night    6. If NO CP at time of call then end call with telling Pt to call back or call 911 if Chest pain returns prior to return call from triage team.

## 2024-06-20 NOTE — Telephone Encounter (Signed)
 Called pt reports an episode of CP that occurred last night around 10:40 PM.  Pt reports was driving home when CP started.      Denies pain that radiates down left arm.  Had nausea and diaphoresis at time of CP.  Rated CP 8:10 took NTG X2 CP decreased to 5:10.  Advised pt could have taken a 3rd dose if pain continued then call 911.  Pt expresses understanding.  Pt reports CP lasted around a total of 1 hour.  Pt reports she then sat around afterwards was able to sleep without difficulty.  Asked pt why she didn't call 911?  Pt reports felt like she would be okay.  Again advised pt if this occurs again to immediately dial 911.   Currently CP free feels like muscle spasm under left breast area.  Scheduled OV for Monday 06/23/24 at 11 am with DOD Dr. Wonda.  Advised pt and daughter who was sitting next to pt if CP returns to immediately call 911  do not wait until Monday office visit.  Pt and daughter both verbalize understanding.

## 2024-06-23 ENCOUNTER — Encounter (HOSPITAL_COMMUNITY): Payer: Self-pay | Admitting: *Deleted

## 2024-06-23 ENCOUNTER — Ambulatory Visit: Attending: Cardiovascular Disease | Admitting: Cardiovascular Disease

## 2024-06-23 ENCOUNTER — Ambulatory Visit: Admitting: Podiatry

## 2024-06-23 ENCOUNTER — Ambulatory Visit (INDEPENDENT_AMBULATORY_CARE_PROVIDER_SITE_OTHER)

## 2024-06-23 ENCOUNTER — Encounter: Payer: Self-pay | Admitting: Cardiovascular Disease

## 2024-06-23 VITALS — Ht 59.0 in | Wt 145.0 lb

## 2024-06-23 VITALS — BP 134/70 | HR 69 | Ht 59.0 in | Wt 145.0 lb

## 2024-06-23 DIAGNOSIS — M25572 Pain in left ankle and joints of left foot: Secondary | ICD-10-CM | POA: Diagnosis not present

## 2024-06-23 DIAGNOSIS — R079 Chest pain, unspecified: Secondary | ICD-10-CM

## 2024-06-23 DIAGNOSIS — I25119 Atherosclerotic heart disease of native coronary artery with unspecified angina pectoris: Secondary | ICD-10-CM

## 2024-06-23 DIAGNOSIS — Z952 Presence of prosthetic heart valve: Secondary | ICD-10-CM

## 2024-06-23 DIAGNOSIS — E559 Vitamin D deficiency, unspecified: Secondary | ICD-10-CM

## 2024-06-23 DIAGNOSIS — M84375A Stress fracture, left foot, initial encounter for fracture: Secondary | ICD-10-CM | POA: Diagnosis not present

## 2024-06-23 NOTE — Patient Instructions (Signed)
 Medication Instructions:  No medication changes were made at this visit. Continue current regimen.   *If you need a refill on your cardiac medications before your next appointment, please call your pharmacy*  Lab Work: None ordered today. If you have labs (blood work) drawn today and your tests are completely normal, you will receive your results only by: MyChart Message (if you have MyChart) OR A paper copy in the mail If you have any lab test that is abnormal or we need to change your treatment, we will call you to review the results.  Testing/Procedures: Your physician has requested that you have a lexiscan  myoview . For further information please visit [email or web address] Please follow instruction sheet, as given.   Follow-Up: At [facility], you and your health needs are our priority.  As part of our continuing mission to provide you with exceptional heart care, our providers are all part of one team.  This team includes your primary Cardiologist (physician) and Advanced Practice Providers or APPs (Physician Assistants and Nurse Practitioners) who all work together to provide you with the care you need, when you need it.   Other Instructions Lexiscan  Myoview  (Stress Test) Instructions  Please arrive 15 minutes prior to your appointment time for registration and insurance purposes.   The test will take approximately 3 to 4 hours to complete; you may bring reading material.  If someone comes with you to your appointment, they will need to remain in the main lobby due to limited space in the testing area. **If you are pregnant or breastfeeding, please notify the nuclear lab prior to your appointment**   How to prepare for your Myocardial Perfusion Test: Do not eat or drink 3 hours prior to your test, except you may have water. Do not consume products containing caffeine (regular or decaffeinated) 12 hours prior to your test. (ex: coffee, chocolate, sodas, tea). Do bring a  list of your current medications with you.  If not listed below, you may take your medications as normal. Do wear comfortable clothes (no dresses or overalls) and walking shoes, tennis shoes preferred (No heels or open toe shoes are allowed). Do NOT wear cologne, perfume, aftershave, or lotions (deodorant is allowed). If these instructions are not followed, your test will have to be rescheduled.   Please report to [address] [place], [place] [address] for your test.  If you have questions or concerns about your appointment, you can call the Nuclear Lab at [phone].   If you cannot keep your appointment, please provide 24 hours notification to the Nuclear Lab, to avoid a possible $50 charge to your account.

## 2024-06-23 NOTE — Progress Notes (Signed)
  Subjective:  Patient ID: Molly Hill, female    DOB: 10-07-1951,  MRN: [id number]  Chief Complaint  Patient presents with   Foot Pain    Pt is here due to left foot pain, states she was seen here 2 weeks ago and received injection into left great toe due to pain, states since then the pain has gotten worse and in a different area the before, having pai to the top and bottom of the foot, also has some swelling.    Discussed the use of AI scribe software for clinical note transcription with the patient, who gave verbal consent to proceed.  History of Present Illness Molly Hill is a 73 year old female who presents with left foot pain and inability to bend her toe.  She has been experiencing pain in her left foot, specifically in the toe area, for approximately two weeks. The pain makes it difficult to walk and feels 'stowed up'. She is unable to bend her toe. No recent falls or injuries have occurred that could have caused the pain.  Two weeks ago, she received an injection, but the pain has persisted and she 'couldn't stand it more'. She has never experienced pain in her toe prior to this incident.  She has not had any previous stress fractures to her knowledge. She takes a vitamin D  supplement, which is relevant given the potential impact on bone health.  No recent falls or injuries.      Objective:    Physical Exam VASCULAR: DP and PT pulse palpable. Foot is warm and well-perfused. Capillary fill time is brisk. DERMATOLOGIC: Normal skin turgor, texture, and temperature. No open lesions, rashes, or ulcerations. NEUROLOGIC: Normal sensation to light touch and pressure. No paresthesias. ORTHOPEDIC: Sharp pain on palpation to mid diaphysis of left first metatarsal. No pain in left first TMT or MTP. Smooth pain-free range of motion of all examined joints. No ecchymosis or bruising. No gross deformity.   No images are attached to the encounter.     Results RADIOLOGY Left foot radiograph: Nondisplaced fracture and stress fracture of the first metatarsal diaphysis. (06/23/2024)   Assessment:   1. Stress fracture of metatarsal bone of left foot, initial encounter   2. Vitamin D  deficiency      Plan:  Patient was evaluated and treated and all questions answered.  Assessment and Plan Assessment & Plan Left first metatarsal stress fracture Nondisplaced stress fracture of the left first metatarsal diaphysis. No previous stress fractures. The fracture is stable, requiring rest and immobilization to heal. Surgical intervention is not needed unless displacement occurs. - Provide a walking boot for immobilization. - Instruct to wear the boot during all ambulation and avoid walking barefoot. - Advise against activities such as dancing and bowling until healed. - Order follow-up radiographs in one month to assess healing. - Check vitamin D  and calcium  levels to evaluate bone health. - Notify of lab results and supplement as needed.  Osteoarthritis of left toe joints Osteoarthritis present in the left big toe joint and other toe joints. No acute intervention required.      Return in about 1 month (around 07/23/2024) for follow up on left 1st metatarsal stress fracture (new xrays).

## 2024-06-23 NOTE — Assessment & Plan Note (Signed)
 Patient reports an episode last week concerning for exertional angina with her known history of obstructive coronary artery disease and previous stenting.  I have recommended a Lexiscan  Myoview  for further evaluation and risk stratification.  She is currently treated with amlodipine , aspirin , atorvastatin .  No other med changes made today.

## 2024-06-23 NOTE — Progress Notes (Signed)
 Cardiology Office Note:    Date:  06/23/2024   ID:  Molly Hill, DOB Nov 12, 1950, MRN [id number]  PCP:  Dwight Trula SQUIBB, MD   [facility] Providers Cardiologist:  Redell Shallow, MD Structural Heart:  Ozell Fell, MD    Referring MD: Dwight Trula SQUIBB, MD   Chief Complaint  Patient presents with   Chest Pain    History of Present Illness:    Molly Hill is a 73 y.o. female with a hx of coronary artery disease and aortic valve disease.  She has a history of LAD PCI in 2022 and TAVR in 2023.  She has had normal function of her TAVR prosthesis on follow-up echo imaging.  The patient is followed regularly by Dr. Shallow and saw him last on May 05, 2024 for scheduled follow-up.  The patient is here with her daughter today. She is added on for evaluation of chest/abdominal discomfort. Last week she was walking into her house when she developed a 'tightness' across the epigastrium, associated with nausea and diaphoresis. She has had allergic reactions to food but this felt different to her. The pain radiated up to the chest so she took 2 nitroglycerin  with partial relief after the second nitroglycerin .  She has had low energy since her event last week, but otherwise has not had any recurrent chest or upper abdominal discomfort.  No orthopnea, PND, or heart palpitations.  Notes that her exercise capacity has been markedly improved since undergoing TAVR a few years ago.  She has never had typical symptoms of exertional angina in the past, even predating her PCI procedures.  Current Medications: Current Meds  Medication Sig   acetaminophen  (TYLENOL ) 500 MG tablet Take 1,000 mg by mouth every 6 (six) hours as needed for moderate pain or headache.   amLODipine  (NORVASC ) 2.5 MG tablet TAKE 1 TABLET BY MOUTH EVERY DAY   amoxicillin  (AMOXIL ) 500 MG capsule Take 4 capsules by mouth 1 hour prior to dental appointments   aspirin  EC 81 MG tablet Take 81 mg by mouth at bedtime.    atorvastatin  (LIPITOR ) 80 MG tablet TAKE 1 TABLET BY MOUTH EVERYDAY AT BEDTIME   Biotin  w/ Vitamins C & E (HAIR/SKIN/NAILS PO) Take 1 capsule by mouth daily.   bisacodyl  (DULCOLAX) 5 MG EC tablet Take 2 tablets (10 mg total) by mouth daily as needed for moderate constipation.   Calcium  Carb-Cholecalciferol  (CALCIUM  600 + D PO) Take 1 tablet by mouth daily.   cholecalciferol  (VITAMIN D ) 25 MCG (1000 UNIT) tablet Take 1,000 Units by mouth daily.   Continuous Blood Gluc Sensor (DEXCOM G7 SENSOR) MISC 1 Device by Does not apply route every 30 (thirty) days.   docusate sodium  (COLACE) 100 MG capsule Take 2 capsules (200 mg total) by mouth daily.   fexofenadine (ALLEGRA) 180 MG tablet Take 180 mg by mouth daily as needed for allergies.   fluticasone  (FLONASE ) 50 MCG/ACT nasal spray Place 1 spray into both nostrils as needed for allergies.   furosemide  (LASIX ) 40 MG tablet Take 40 mg by mouth daily as needed.   glucose blood (ACCU-CHEK AVIVA PLUS) test strip Check sugar 4x daily   glucose blood test strip 1 each by Other route 4 (four) times daily -  before meals and at bedtime. Use as instructed   insulin  aspart (NOVOLOG  FLEXPEN) 100 UNIT/ML FlexPen Max daily 45 units per scale   insulin  degludec (TRESIBA  FLEXTOUCH) 100 UNIT/ML FlexTouch Pen Inject 20 Units into the skin daily.  Insulin  Pen Needle 32G X 4 MM MISC 1 Device by Does not apply route in the morning, at noon, in the evening, and at bedtime.   loperamide  (IMODIUM  A-D) 2 MG tablet Take 1-2 mg by mouth 4 (four) times daily as needed for diarrhea or loose stools.   losartan  (COZAAR ) 25 MG tablet TAKE 1 TABLET (25 MG TOTAL) BY MOUTH DAILY.   meclizine  (ANTIVERT ) 25 MG tablet Take 1 tablet (25 mg total) by mouth 3 (three) times daily as needed for dizziness.   metoprolol  succinate (TOPROL  XL) 25 MG 24 hr tablet Take 1 tablet (25 mg total) by mouth daily.   nitroGLYCERIN  (NITROSTAT ) 0.4 MG SL tablet PLACE 1 TABLET UNDER THE TONGUE EVERY 5 MINUTES  AS NEEDED FOR CHEST PAIN.   omeprazole (PRILOSEC) 20 MG capsule Take 20 mg by mouth daily.   OVER THE COUNTER MEDICATION Potassium supplement (unknown dosage per patient)   vitamin B-12 (CYANOCOBALAMIN ) 1000 MCG tablet Take 1,000 mcg by mouth daily.   vitamin C (ASCORBIC ACID) 500 MG tablet Take 500 mg by mouth daily.   Zinc 50 MG CAPS Take 50 mg by mouth daily.   [DISCONTINUED] meloxicam  (MOBIC ) 15 MG tablet Take 1 tablet (15 mg total) by mouth daily.   [DISCONTINUED] Naphazoline-Pheniramine (EYE ALLERGY RELIEF OP) Place 1 drop into both eyes daily as needed (allergies).     Allergies:   Codeine , Onion, and Reglan [metoclopramide]   ROS:   Please see the history of present illness.    All other systems reviewed and are negative.  EKGs/Labs/Other Studies Reviewed:    The following studies were reviewed today: Cardiac Studies & Procedures   ______________________________________________________________________________________________ CARDIAC CATHETERIZATION  CARDIAC CATHETERIZATION 10/21/2021  Conclusion   RPAV lesion is 90% stenosed.   Ost RCA lesion is 50% stenosed.   Ost LAD to Prox LAD lesion is 75% stenosed.   Non-stenotic Prox LAD to Mid LAD lesion was previously treated.   A drug-eluting stent was successfully placed using a STENT ONYX FRONTIER 2.5X26.   Post intervention, there is a 10% residual stenosis.  1.  Negative FFR evaluation of the RCA, moderate nonobstructive lesion 2.  Severe flow-limiting diffuse calcific proximal LAD stenosis of 75%, strongly positive RFR of 0.75, treated successfully with high-pressure noncompliant balloon angioplasty and stenting using a 2.75 x 26 mm Onyx frontier DES optimized with IVUS imaging.  Overnight observation in the setting of severe aortic stenosis and proximal LAD intervention. Home tomorrow am if no complications arise. DAPT with ASA and plavix  at least 12 months. Staged TAVR after recovery from PCI.  Findings Coronary  Findings Diagnostic  Dominance: Right  Left Anterior Descending Ost LAD to Prox LAD lesion is 75% stenosed. The lesion is type C. The lesion is severely calcified. Diffuse, calcific lesion Non-stenotic Prox LAD to Mid LAD lesion was previously treated.  Right Coronary Artery Ost RCA lesion is 50% stenosed. The lesion is calcified. Ostial lesion  Right Posterior Atrioventricular Artery RPAV lesion is 90% stenosed.  Intervention  Ost LAD to Prox LAD lesion Stent CATH VISTA GUIDE 6FR XBLAD3.0 guide catheter was inserted. Lesion crossed with guidewire using a WIRE COUGAR XT STRL 190CM. Pre-stent angioplasty was performed using a BALLN SAPPHIRE Titus 2.5X15. Maximum pressure:  16 atm. A drug-eluting stent was successfully placed using a STENT ONYX FRONTIER 2.5X26. Maximum pressure: 20 atm. Stent overlaps previously placed stent. Post-Intervention Lesion Assessment The intervention was successful. Pre-interventional TIMI flow is 3. Post-intervention TIMI flow is 3. No complications occurred  at this lesion. Pressure gradient/FFR was measured on the lesion using a GUIDEWIRE PRESSURE X 175.FFR measurement: 0.75. Ultrasound (IVUS) was performed on the lesion post PCI using a CATHETER OPTICROSS HD. Stent well expanded. Severe plaque burden detected. Lesion characteristics:  calcified. There is a 10% residual stenosis post intervention.   CARDIAC CATHETERIZATION  CARDIAC CATHETERIZATION 10/10/2021  Conclusion Images from the original result were not included.    Ost RCA lesion is 75% stenosed.   RPAV lesion is 90% stenosed.   Ost LAD to Prox LAD lesion is 40% stenosed.   Previously placed Prox LAD to Mid LAD stent (unknown type) is  widely patent.  Jakera Beaupre Rather is a 73 y.o. female   [id number] LOCATION:  FACILITY: [facility] PHYSICIAN: Dorn Lesches, M.D. 11-09-1950   DATE OF PROCEDURE:  10/10/2021  DATE OF DISCHARGE:     CARDIAC CATHETERIZATION    History obtained from chart  review.  73 year old female with a history of hypertension, hyperlipidemia, diabetes and prior history of LAD stenting in 2013.  She does have mild to moderate aortic stenosis.  She has had several weeks of chest pain when lying down.  She had worse pain tonight and came to the emergency room where she had a millimeter of ST segment elevation in lead V1 and ST segment depression in the inferior lateral leads.  She was brought to the Cath Lab for urgent cath and potential intervention.  Impression Ms. Campion  has a widely patent LAD stent and moderate segmental disease in the proximal LAD.  There is no other disease in the left system.  She does have a 75% true ostial RCA with mild damping of a 5 [place] catheter.  I reviewed her angiograms with Dr. Geralene who agrees that there is no culprit lesion in the LAD territory.  In addition, her LVEDP was only 8.  We will treat her medically at this time, cycle her enzymes and heparinize her for the time being.  The radial sheath was removed and a TR band was placed on the right wrist to achieve patent hemostasis.  The patient left lab in stable condition.  Dorn Lesches. MD, [facility] 10/10/2021 1:19 AM  Findings Coronary Findings Diagnostic  Dominance: Right  Left Anterior Descending Ost LAD to Prox LAD lesion is 40% stenosed. Previously placed Prox LAD to Mid LAD stent (unknown type) is  widely patent.  Right Coronary Artery Ost RCA lesion is 75% stenosed.  Right Posterior Atrioventricular Artery RPAV lesion is 90% stenosed.  Intervention  No interventions have been documented.   STRESS TESTS  MYOCARDIAL PERFUSION IMAGING 05/03/2020  Interpretation Summary  <27mm Horizontal ST segment depression was noted during stress.  Nuclear stress EF: 67%.  The left ventricular ejection fraction is hyperdynamic (>65%).  The study is normal.  This is a low risk study.   ECHOCARDIOGRAM  ECHOCARDIOGRAM COMPLETE 06/11/2024  Narrative ECHOCARDIOGRAM  REPORT    Patient Name:   NARALY FRITCHER Date of Exam: 06/11/2024 Medical Rec #:  [id number]      Height:       59.0 in Accession #:    [id number]     Weight:       148.0 lb Date of Birth:  [birth year]     BSA:          1.623 m Patient Age:    72 years       BP:           124/80 mmHg Patient Gender: F  HR:           64 bpm. Exam Location:  [address]  Procedure: 2D Echo, Cardiac Doppler and Color Doppler (Both Spectral and Color Flow Doppler were utilized during procedure).  Indications:    I35.9 Aortic Valve Disorder  History:        Patient has prior history of Echocardiogram examinations, most recent 12/08/2022. Previous Myocardial Infarction, TAVR-23 mm Celestia MOLDER.; Risk Factors:Dyslipidemia and Diabetes. Aortic Valve: 23 mm Sapien prosthetic, stented (TAVR) valve is present in the aortic position.  Sonographer:    Carl Rodgers-Jones RDCS Referring Phys: [id number] BRIAN S CRENSHAW  IMPRESSIONS   1. Left ventricular ejection fraction, by estimation, is 60 to 65%. The left ventricle has normal function. The left ventricle has no regional wall motion abnormalities. There is mild left ventricular hypertrophy. Left ventricular diastolic function could not be evaluated. Elevated left atrial pressure. 2. Right ventricular systolic function is normal. The right ventricular size is normal. 3. Left atrial size was moderately dilated. 4. The mitral valve is normal in structure. Mild mitral valve regurgitation. Mild mitral stenosis. Severe mitral annular calcification. 5. The aortic valve has been repaired/replaced. Aortic valve regurgitation is not visualized. No aortic stenosis is present. There is a 23 mm Sapien prosthetic (TAVR) valve present in the aortic position. 6. The inferior vena cava is normal in size with greater than 50% respiratory variability, suggesting right atrial pressure of 3 mmHg.  FINDINGS Left Ventricle: Left ventricular ejection fraction, by  estimation, is 60 to 65%. The left ventricle has normal function. The left ventricle has no regional wall motion abnormalities. The left ventricular internal cavity size was normal in size. There is mild left ventricular hypertrophy. Left ventricular diastolic function could not be evaluated due to mitral annular calcification (moderate or greater). Left ventricular diastolic function could not be evaluated. Elevated left atrial pressure. Right Ventricle: The right ventricular size is normal. Right ventricular systolic function is normal.  Left Atrium: Left atrial size was moderately dilated.  Right Atrium: Right atrial size was normal in size.  Pericardium: There is no evidence of pericardial effusion.  The mitral valve is normal in structure. Severe mitral annular calcification. Mild mitral valve regurgitation. Mild mitral valve stenosis. Tricuspid Valve: The tricuspid valve is normal in structure. Tricuspid valve regurgitation is trivial. No evidence of tricuspid stenosis.  The aortic valve has been repaired/replaced. Aortic valve regurgitation is not visualized. No aortic stenosis is present. There is a 23 mm Sapien prosthetic, stented (TAVR) valve present in the aortic position. Pulmonic Valve: The pulmonic valve was normal in structure. Pulmonic valve regurgitation is mild. No evidence of pulmonic stenosis.  Aorta: The aortic root is normal in size and structure.  Venous: The inferior vena cava is normal in size with greater than 50% respiratory variability, suggesting right atrial pressure of 3 mmHg.  IAS/Shunts: No atrial level shunt detected by color flow Doppler.   LEFT VENTRICLE PLAX 2D LVIDd:         3.70 cm Diastology LVIDs:         2.30 cm LV e' medial:    6.04 cm/s LV PW:         1.10 cm LV E/e' medial:  26.1 LV IVS:        1.10 cm LV e' lateral:   4.38 cm/s LV E/e' lateral: 36.0   RIGHT VENTRICLE             IVC RV Basal diam:  3.00 cm  IVC diam: 1.50 cm RV S  prime:     10.90 cm/s TAPSE (M-mode): 2.2 cm  LEFT ATRIUM             Index        RIGHT ATRIUM          Index LA diam:        4.65 cm 2.87 cm/m   RA Area:     9.18 cm LA Vol (A2C):   64.6 ml 39.81 ml/m  RA Volume:   19.60 ml 12.08 ml/m LA Vol (A4C):   60.5 ml 37.28 ml/m LA Biplane Vol: 62.3 ml 38.39 ml/m AORTIC VALVE AV Vmax:           165.80 cm/s AV Vmean:          112.400 cm/s AV VTI:            0.409 m AV Peak Grad:      11.0 mmHg AV Mean Grad:      6.0 mmHg LVOT Vmax:         88.00 cm/s LVOT Vmean:        60.400 cm/s LVOT VTI:          0.231 m LVOT/AV VTI ratio: 0.57  AORTA Ao Root diam: 2.50 cm Ao Asc diam:  2.30 cm  MITRAL VALVE MV Area (PHT): 2.64 cm     SHUNTS MV Peak grad:  12.1 mmHg    Systemic VTI: 0.23 m MV Mean grad:  6.0 mmHg MV Vmax:       1.74 m/s MV Vmean:      115.0 cm/s MV Decel Time: 287 msec MV E velocity: 157.50 cm/s MV A velocity: 125.50 cm/s MV E/A ratio:  1.25  Redell Shallow MD Electronically signed by Redell Shallow MD Signature Date/Time: 06/11/2024/11:48:18 AM    Final    MONITORS  CARDIAC EVENT MONITOR 05/05/2020  Narrative  Sinus bradycardia, normal sinus rhythm and sinus tachycardia. The average heart rate was 73bpm and ranged from 54-153bpm.  Wide complex tachycardia consistent with nonsustained ventricular tachycardia for 4 beats.   CT SCANS  CT CORONARY MORPH W/CTA COR W/SCORE 10/12/2021  Addendum 10/12/2021  9:47 PM ADDENDUM REPORT: 10/12/2021 21:44  CLINICAL DATA:  Severe Aortic Stenosis.  EXAM: Cardiac TAVR CT  TECHNIQUE: A non-contrast, gated CT scan was obtained with axial slices of 3 mm through the heart for aortic valve calcium  scoring. A 90 kV retrospective, gated, contrast cardiac scan was obtained. Gantry rotation speed was 250 msecs and collimation was 0.6 mm. Nitroglycerin  was not given. The 3D data set was reconstructed in 5% intervals of the 0-95% of the R-R cycle. Systolic and  diastolic phases were analyzed on a dedicated workstation using MPR, MIP, and VRT modes. The patient received 100 cc of contrast.  FINDINGS: Image quality: Excellent.  Noise artifact is: Limited.  Valve Morphology: The aortic valve is tricuspid with severe diffuse calcifications. There is acquired fusion of the RCC/LCC.  Aortic Valve Calcium  score: 798  Aortic annular dimension:  Phase assessed: 30%  Annular area: 329 mm2  Annular perimeter: 66.0 mm  Max diameter: 23.3 mm  Min diameter: 18.8 mm  Membranous septum length: 8.4 mm  Annular and subannular calcification: Trace, layered single calcification under the NCC.  Optimal coplanar projection: LAO 21 CRA 6  Coronary Artery Height above Annulus:  Left Main: 13.5 mm  Right Coronary: 16.7 mm  Sinus of Valsalva Measurements:  Non-coronary: 28.3 mm  Right-coronary: 27.0 mm  Left-coronary:  28.6 mm  Sinus of Valsalva Height:  Non-coronary: 17.6 mm  Right-coronary: 21.2 mm  Left-coronary: 19.9 mm  Sinotubular Junction: 24.1 mm.  Mild calcifications.  Ascending Thoracic Aorta: 25.9 mm.  Mild calcifications.  Coronary Arteries: Normal coronary origin. Right dominance. The study was performed without use of NTG and is insufficient for plaque evaluation. Please refer to recent cardiac catheterization for coronary assessment. Severe 3-vessel coronary calcifications noted.  Cardiac Morphology:  Right Atrium: Right atrial size is within normal limits.  Right Ventricle: The right ventricular cavity is within normal limits.  Left Atrium: Left atrial size is dilated with no left atrial appendage filling defect. Small PFO present.  Left Ventricle: The ventricular cavity size is within normal limits. There are no stigmata of prior infarction. There is no abnormal filling defect. Normal left ventricular function, EF=71%. No regional wall motion abnormalities.  Pulmonary arteries: Normal in size without  proximal filling defect.  Pulmonary veins: Normal pulmonary venous drainage.  Pericardium: Normal thickness with no significant effusion or calcium  present.  Mitral Valve: The mitral valve is degenerative with moderate to severe mitral annular calcification.  Extra-cardiac findings: See attached radiology report for non-cardiac structures.  IMPRESSION: 1. Tricuspid aortic valve with severe diffuse calcifications. There is acquired fusion of the RCC/LCC.  2. Small aortic valve annulus (329 mm2). Would consider a 26 mm Evolut Pro as sinus measurements are appropriate and good sinus heights noted. No significant annular or subannular calcifications.  3. Sufficient coronary to annulus distance.  4. Optimal Fluoroscopic Angle for Delivery: LAO 21 CRA 6  [place] T. Barbaraann, MD   Electronically Signed By: Darryle Barbaraann M.D. On: 10/12/2021 21:44  Narrative EXAM: OVER-READ INTERPRETATION  CT CHEST  The following report is an over-read performed by radiologist Dr. Toribio Aye of [facility] on 10/12/2021. This over-read does not include interpretation of cardiac or coronary anatomy or pathology. The coronary calcium  score/coronary CTA interpretation by the cardiologist is attached.  COMPARISON:  Chest CT 01/15/2021.  FINDINGS: Extracardiac findings will be described separately under dictation for contemporaneously obtained CTA chest, abdomen and pelvis.  IMPRESSION: Please see separate dictation for contemporaneously obtained CTA chest, abdomen and pelvis dated 10/12/2021 for full description of relevant extracardiac findings.  Electronically Signed: By: Toribio Aye M.D. On: 10/12/2021 09:21     ______________________________________________________________________________________________      EKG:   EKG Interpretation Date/Time:  Monday June 23 2024 11:19:08 EDT Ventricular Rate:  69 PR Interval:  164 QRS Duration:  86 QT  Interval:  412 QTC Calculation: 441 R Axis:   61  Text Interpretation: Normal sinus rhythm Septal infarct (cited on or before 05-May-2024) When compared with ECG of 05-May-2024 09:38, No significant change was found Confirmed by Wonda Sharper [id number]) on 06/23/2024 11:30:23 AM    Recent Labs: 01/02/2024: ALT 15; BUN 28; Creatinine 1.06; Hemoglobin 11.9; Platelet Count 145; Potassium 4.4; Sodium 136  Recent Lipid Panel    Component Value Date/Time   CHOL 111 12/18/2022 0923   TRIG 77 12/18/2022 0923   HDL 56 12/18/2022 0923   CHOLHDL 2.0 12/18/2022 0923   CHOLHDL 2.5 10/10/2021 0605   VLDL 24 10/10/2021 0605   LDLCALC 39 12/18/2022 0923     Risk Assessment/Calculations:                Physical Exam:    VS:  BP 134/70   Pulse 69   Ht 4' 11 (1.499 m)   Wt 145 lb (65.8 kg)   SpO2  95%   BMI 29.29 kg/m     Wt Readings from Last 3 Encounters:  06/23/24 145 lb (65.8 kg)  05/08/24 148 lb (67.1 kg)  05/06/24 147 lb 6.4 oz (66.9 kg)     GEN:  Well nourished, well developed in no acute distress HEENT: Normal NECK: No JVD; No carotid bruits LYMPHATICS: No lymphadenopathy CARDIAC: RRR, 2/6 SEM at the RUSB RESPIRATORY:  Clear to auscultation without rales, wheezing or rhonchi  ABDOMEN: Soft, non-tender, non-distended MUSCULOSKELETAL:  No edema; No deformity  SKIN: Warm and dry NEUROLOGIC:  Alert and oriented x 3 PSYCHIATRIC:  Normal affect   Assessment & Plan Coronary artery disease involving native coronary artery of native heart with angina pectoris [facility]) Patient reports an episode last week concerning for exertional angina with her known history of obstructive coronary artery disease and previous stenting.  I have recommended a Lexiscan  Myoview  for further evaluation and risk stratification.  She is currently treated with amlodipine , aspirin , atorvastatin .  No other med changes made today. S/P TAVR (transcatheter aortic valve replacement) Reviewed recent echo which  demonstrates normal function of her TAVR prosthesis and normal LVEF. Chest pain of uncertain etiology As above, check Lexiscan  Myoview  stress test.  If significant ischemia demonstrated, she understands that she will be referred for cardiac catheterization and possible PCI.  Will contact her once the results of her stress test are available.       Informed Consent   Shared Decision Making/Informed Consent The risks [chest pain, shortness of breath, cardiac arrhythmias, dizziness, blood pressure fluctuations, myocardial infarction, stroke/transient ischemic attack, nausea, vomiting, allergic reaction, radiation exposure, metallic taste sensation and life-threatening complications (estimated to be 1 in 10,000)], benefits (risk stratification, diagnosing coronary artery disease, treatment guidance) and alternatives of a nuclear stress test were discussed in detail with Ms. Ebright and she agrees to proceed.       Medication Adjustments/Labs and Tests Ordered: Current medicines are reviewed at length with the patient today.  Concerns regarding medicines are outlined above.  Orders Placed This Encounter  Procedures   MYOCARDIAL PERFUSION IMAGING   EKG 12-Lead   No orders of the defined types were placed in this encounter.   Patient Instructions  Medication Instructions:  No medication changes were made at this visit. Continue current regimen.   *If you need a refill on your cardiac medications before your next appointment, please call your pharmacy*  Lab Work: None ordered today. If you have labs (blood work) drawn today and your tests are completely normal, you will receive your results only by: MyChart Message (if you have MyChart) OR A paper copy in the mail If you have any lab test that is abnormal or we need to change your treatment, we will call you to review the results.  Testing/Procedures: Your physician has requested that you have a lexiscan  myoview . For further information  please visit [email or web address] Please follow instruction sheet, as given.   Follow-Up: At [facility], you and your health needs are our priority.  As part of our continuing mission to provide you with exceptional heart care, our providers are all part of one team.  This team includes your primary Cardiologist (physician) and Advanced Practice Providers or APPs (Physician Assistants and Nurse Practitioners) who all work together to provide you with the care you need, when you need it.   Other Instructions Lexiscan  Myoview  (Stress Test) Instructions  Please arrive 15 minutes prior to your appointment time for registration and insurance purposes.   The  test will take approximately 3 to 4 hours to complete; you may bring reading material.  If someone comes with you to your appointment, they will need to remain in the main lobby due to limited space in the testing area. **If you are pregnant or breastfeeding, please notify the nuclear lab prior to your appointment**   How to prepare for your Myocardial Perfusion Test: Do not eat or drink 3 hours prior to your test, except you may have water. Do not consume products containing caffeine (regular or decaffeinated) 12 hours prior to your test. (ex: coffee, chocolate, sodas, tea). Do bring a list of your current medications with you.  If not listed below, you may take your medications as normal. Do wear comfortable clothes (no dresses or overalls) and walking shoes, tennis shoes preferred (No heels or open toe shoes are allowed). Do NOT wear cologne, perfume, aftershave, or lotions (deodorant is allowed). If these instructions are not followed, your test will have to be rescheduled.   Please report to [address], [place], [place] [address] for your test.  If you have questions or concerns about your appointment, you can call the Nuclear Lab at [phone].   If you cannot keep your appointment, please provide 24 hours notification to the  N[facility], to avoid a possible $50 charge to your account.     Signed, Ozell Fell, MD  06/23/2024 11:51 AM    [facility]

## 2024-06-23 NOTE — Assessment & Plan Note (Signed)
 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

## 2024-06-26 ENCOUNTER — Ambulatory Visit: Payer: Self-pay | Admitting: Podiatry

## 2024-06-26 LAB — VITAMIN D 25 HYDROXY (VIT D DEFICIENCY, FRACTURES): Vit D, 25-Hydroxy: 97.2 ng/mL (ref 30.0–100.0)

## 2024-06-26 LAB — CALCIUM: Calcium: 9.2 mg/dL (ref 8.7–10.3)

## 2024-06-27 ENCOUNTER — Other Ambulatory Visit: Payer: Self-pay | Admitting: Cardiovascular Disease

## 2024-06-27 ENCOUNTER — Ambulatory Visit: Admitting: Podiatry

## 2024-06-27 DIAGNOSIS — R079 Chest pain, unspecified: Secondary | ICD-10-CM

## 2024-07-02 ENCOUNTER — Ambulatory Visit (HOSPITAL_COMMUNITY)
Admission: RE | Admit: 2024-07-02 | Discharge: 2024-07-02 | Disposition: A | Discharge status: Home / Self Care | Source: Ambulatory Visit | Attending: Cardiovascular Disease | Admitting: Cardiovascular Disease

## 2024-07-02 DIAGNOSIS — R079 Chest pain, unspecified: Secondary | ICD-10-CM | POA: Insufficient documentation

## 2024-07-02 LAB — MYOCARDIAL PERFUSION IMAGING
LV dias vol: 65 mL (ref 46–106)
LV sys vol: 17 mL (ref 3.8–5.2)
Nuc Stress EF: 74 %
Peak HR: 95 {beats}/min
Rest HR: 72 {beats}/min
Rest Nuclear Isotope Dose: 9.9 mCi
SDS: 1
SRS: 0
SSS: 1
ST Depression (mm): 0 mm
Stress Nuclear Isotope Dose: 32.4 mCi
TID: 1

## 2024-07-02 MED ORDER — REGADENOSON 0.4 MG/5ML IV SOLN
INTRAVENOUS | Status: AC
  Filled 2024-07-02: qty 5

## 2024-07-02 MED ORDER — REGADENOSON 0.4 MG/5ML IV SOLN
0.4000 mg | Freq: Once | INTRAVENOUS | Status: AC
  Administered 2024-07-02: 0.4 mg via INTRAVENOUS

## 2024-07-02 MED ORDER — TECHNETIUM TC 99M TETROFOSMIN IV KIT
32.4000 | PACK | Freq: Once | INTRAVENOUS | Status: AC | PRN
  Administered 2024-07-02: 32.4 via INTRAVENOUS

## 2024-07-02 MED ORDER — TECHNETIUM TC 99M TETROFOSMIN IV KIT
9.9000 | PACK | Freq: Once | INTRAVENOUS | Status: AC | PRN
  Administered 2024-07-02: 9.9 via INTRAVENOUS

## 2024-07-04 ENCOUNTER — Ambulatory Visit: Payer: Self-pay | Admitting: Cardiovascular Disease

## 2024-07-08 ENCOUNTER — Other Ambulatory Visit: Payer: Self-pay | Admitting: Podiatry

## 2024-07-14 ENCOUNTER — Telehealth: Payer: Self-pay | Admitting: Cardiology

## 2024-07-14 MED ORDER — LOSARTAN POTASSIUM 25 MG PO TABS: 25.0000 mg | ORAL_TABLET | Freq: Every day | ORAL | 3 refills | Status: AC

## 2024-07-14 NOTE — Telephone Encounter (Signed)
*  STAT* If patient is at the pharmacy, call can be transferred to refill team.   1. Which medications need to be refilled? (please list name of each medication and dose if known) losartan  (COZAAR ) 25 MG tablet    2. Would you like to learn more about the convenience, safety, & potential cost savings by using the [facility]?   3. Are you open to using the [facility] (Type [facility]. ).   4. Which pharmacy/location (including street and city if local pharmacy) is medication to be sent to? [id number] - [place], [place] - [address] AT [address] OF [facility] [address]    5. Do they need a 30 day or 90 day supply? 90 day

## 2024-07-14 NOTE — Telephone Encounter (Signed)
 RX sent in

## 2024-07-21 ENCOUNTER — Ambulatory Visit (INDEPENDENT_AMBULATORY_CARE_PROVIDER_SITE_OTHER)

## 2024-07-21 ENCOUNTER — Ambulatory Visit: Admitting: Podiatry

## 2024-07-21 VITALS — Ht 59.0 in | Wt 145.0 lb

## 2024-07-21 DIAGNOSIS — M84375A Stress fracture, left foot, initial encounter for fracture: Secondary | ICD-10-CM | POA: Diagnosis not present

## 2024-07-21 NOTE — Progress Notes (Addendum)
  Subjective:  Patient ID: Molly Hill, female    DOB: August 15, 1951,  MRN: [id number]  Chief Complaint  Patient presents with   Fracture    RM 10 Patient is here to f/u left 1st metatarsal stress fracture. Patient request nail trim.    Discussed the use of AI scribe software for clinical note transcription with the patient, who gave verbal consent to proceed.  History of Present Illness Molly Hill is a 72 year old female who presents with left foot pain and inability to bend her toe.  She returns for follow-up has had some swelling and soreness around the left foot but has been using the boot.  Toe is changing position some      Objective:    Physical Exam VASCULAR: DP and PT pulse palpable. Foot is warm and well-perfused. Capillary fill time is brisk. DERMATOLOGIC: Normal skin turgor, texture, and temperature. No open lesions, rashes, or ulcerations. NEUROLOGIC: Normal sensation to light touch and pressure. No paresthesias. ORTHOPEDIC: Pain is improved with still has edema and tenderness over the first metatarsal   No images are attached to the encounter.    Results RADIOLOGY Left foot radiograph: Slight dorsal displacement and angulation of the first metatarsal, has a less than 2 mm dorsal displacement   Assessment:   1. Stress fracture of metatarsal bone of left foot, initial encounter      Plan:  Patient was evaluated and treated and all questions answered.  Assessment and Plan Assessment & Plan Left first metatarsal stress fracture Slight displacement in fracture alignment but is minimal.  Recommend continuing nonoperative treatment utilizing cam boot.  Focus weight on heel only at this point.  Does not need supplementation for vitamin D , level was adequate.  Follow-up in 1 month for new x-ray.  Courtesy nail trim performed today as she has not had a chance to have a pedicure with her fracture.       Return in about 1 month (around 08/21/2024)  for fracture follow up left foot (new xrays).

## 2024-08-07 ENCOUNTER — Emergency Department (HOSPITAL_BASED_OUTPATIENT_CLINIC_OR_DEPARTMENT_OTHER)
Admission: EM | Admit: 2024-08-07 | Discharge: 2024-08-07 | Disposition: A | Discharge status: Home / Self Care | Attending: Emergency Medicine | Admitting: Emergency Medicine

## 2024-08-07 ENCOUNTER — Other Ambulatory Visit: Payer: Self-pay

## 2024-08-07 ENCOUNTER — Encounter (HOSPITAL_BASED_OUTPATIENT_CLINIC_OR_DEPARTMENT_OTHER): Payer: Self-pay

## 2024-08-07 DIAGNOSIS — N3091 Cystitis, unspecified with hematuria: Secondary | ICD-10-CM | POA: Diagnosis not present

## 2024-08-07 DIAGNOSIS — I10 Essential (primary) hypertension: Secondary | ICD-10-CM | POA: Insufficient documentation

## 2024-08-07 DIAGNOSIS — R319 Hematuria, unspecified: Secondary | ICD-10-CM | POA: Diagnosis present

## 2024-08-07 DIAGNOSIS — E109 Type 1 diabetes mellitus without complications: Secondary | ICD-10-CM | POA: Diagnosis not present

## 2024-08-07 DIAGNOSIS — Z794 Long term (current) use of insulin: Secondary | ICD-10-CM | POA: Diagnosis not present

## 2024-08-07 DIAGNOSIS — Z7982 Long term (current) use of aspirin: Secondary | ICD-10-CM | POA: Insufficient documentation

## 2024-08-07 DIAGNOSIS — Z853 Personal history of malignant neoplasm of breast: Secondary | ICD-10-CM | POA: Diagnosis not present

## 2024-08-07 DIAGNOSIS — I2510 Atherosclerotic heart disease of native coronary artery without angina pectoris: Secondary | ICD-10-CM | POA: Insufficient documentation

## 2024-08-07 DIAGNOSIS — Z79899 Other long term (current) drug therapy: Secondary | ICD-10-CM | POA: Diagnosis not present

## 2024-08-07 LAB — URINALYSIS, W/ REFLEX TO CULTURE (INFECTION SUSPECTED)
Bilirubin Urine: NEGATIVE
Glucose, UA: NEGATIVE mg/dL
Ketones, ur: NEGATIVE mg/dL
Nitrite: NEGATIVE
Protein, ur: 100 mg/dL — AB
RBC / HPF: >50 RBC/hpf (ref 0–5)
Specific Gravity, Urine: 1.023 (ref 1.005–1.030)
WBC, UA: >50 WBC/hpf (ref 0–5)
pH: 5.5 (ref 5.0–8.0)

## 2024-08-07 LAB — CBC WITH DIFFERENTIAL/PLATELET
Abs Immature Granulocytes: 0.01 K/uL (ref 0.00–0.07)
Basophils Absolute: 0 K/uL (ref 0.0–0.1)
Basophils Relative: 1 %
Eosinophils Absolute: 0.3 K/uL (ref 0.0–0.5)
Eosinophils Relative: 5 %
HCT: 33 % — ABNORMAL LOW (ref 36.0–46.0)
Hemoglobin: 10.8 g/dL — ABNORMAL LOW (ref 12.0–15.0)
Immature Granulocytes: 0 %
Lymphocytes Relative: 27 %
Lymphs Abs: 1.6 K/uL (ref 0.7–4.0)
MCH: 30.9 pg (ref 26.0–34.0)
MCHC: 32.7 g/dL (ref 30.0–36.0)
MCV: 94.3 fL (ref 80.0–100.0)
Monocytes Absolute: 0.6 K/uL (ref 0.1–1.0)
Monocytes Relative: 11 %
Neutro Abs: 3.2 K/uL (ref 1.7–7.7)
Neutrophils Relative %: 56 %
Platelets: 149 K/uL — ABNORMAL LOW (ref 150–400)
RBC: 3.5 MIL/uL — ABNORMAL LOW (ref 3.87–5.11)
RDW: 13.5 % (ref 11.5–15.5)
WBC: 5.8 K/uL (ref 4.0–10.5)
nRBC: 0 % (ref 0.0–0.2)

## 2024-08-07 LAB — BASIC METABOLIC PANEL WITH GFR
Anion gap: 9 (ref 5–15)
BUN: 29 mg/dL — ABNORMAL HIGH (ref 8–23)
CO2: 27 mmol/L (ref 22–32)
Calcium: 9.7 mg/dL (ref 8.9–10.3)
Chloride: 102 mmol/L (ref 98–111)
Creatinine, Ser: 1.56 mg/dL — ABNORMAL HIGH (ref 0.44–1.00)
GFR, Estimated: 35 mL/min — ABNORMAL LOW (ref >60)
Glucose, Bld: 153 mg/dL — ABNORMAL HIGH (ref 70–99)
Potassium: 4.6 mmol/L (ref 3.5–5.1)
Sodium: 138 mmol/L (ref 135–145)

## 2024-08-07 MED ORDER — SODIUM CHLORIDE 0.9 % IV BOLUS
500.0000 mL | Freq: Once | INTRAVENOUS | Status: AC
  Administered 2024-08-07: 500 mL via INTRAVENOUS

## 2024-08-07 MED ORDER — CEPHALEXIN 500 MG PO CAPS: 500.0000 mg | ORAL_CAPSULE | Freq: Three times a day (TID) | ORAL | 0 refills | Status: AC

## 2024-08-07 MED ORDER — SODIUM CHLORIDE 0.9 % IV SOLN
1.0000 g | Freq: Once | INTRAVENOUS | Status: AC
  Administered 2024-08-07: 1 g via INTRAVENOUS
  Filled 2024-08-07: qty 10

## 2024-08-07 MED ORDER — PHENAZOPYRIDINE HCL 200 MG PO TABS: 200.0000 mg | ORAL_TABLET | Freq: Three times a day (TID) | ORAL | 0 refills | Status: AC

## 2024-08-07 NOTE — ED Notes (Signed)
 Chaperone for EDP [place] for pelvic exam. Pt tolerated well.

## 2024-08-07 NOTE — ED Triage Notes (Signed)
 Patient reports noting some discomfort and blood in her urine two days ago. She said she is going on a cruise to the [place] so she wants to be all healthy for it.

## 2024-08-07 NOTE — ED Provider Notes (Signed)
 [facility] AT [facility] Provider Note   CSN: [id number] Arrival date & time: 08/07/24  9090     Patient presents with: Recurrent UTI   Molly Hill is a 73 y.o. female.   Pt is a 73 yo with pmhx significant for htn, dm1, cad, hld, breast cancer (now remission), gerd, and arthritis.  Pt said she noticed blood in her urine with clots a few days ago.  It would go away, but come back.  She is not 100% sure it is from her urine, but does not think it's from her rectum.  She is not on blood thinners.  She does not take estrogen.  She denies any back pain.  No dysuria.       Prior to Admission medications   Medication Sig Start Date End Date Taking? Authorizing Provider  cephALEXin  (KEFLEX ) 500 MG capsule Take 1 capsule (500 mg total) by mouth 3 (three) times daily. 08/07/24  Yes Dean Clarity, MD  phenazopyridine (PYRIDIUM) 200 MG tablet Take 1 tablet (200 mg total) by mouth 3 (three) times daily. 08/07/24  Yes Dean Clarity, MD  acetaminophen  (TYLENOL ) 500 MG tablet Take 1,000 mg by mouth every 6 (six) hours as needed for moderate pain or headache.    [provider]  amLODipine  (NORVASC ) 2.5 MG tablet TAKE 1 TABLET BY MOUTH EVERY DAY 03/13/24   Pietro Redell RAMAN, MD  amoxicillin  (AMOXIL ) 500 MG capsule Take 4 capsules by mouth 1 hour prior to dental appointments Patient not taking: Reported on 07/21/2024 01/22/24   Pietro Redell RAMAN, MD  aspirin  EC 81 MG tablet Take 81 mg by mouth at bedtime.    [provider]  atorvastatin  (LIPITOR ) 80 MG tablet TAKE 1 TABLET BY MOUTH EVERYDAY AT BEDTIME 09/22/22   Wonda Sharper, MD  Biotin  w/ Vitamins C & E (HAIR/SKIN/NAILS PO) Take 1 capsule by mouth daily.    [provider]  bisacodyl  (DULCOLAX) 5 MG EC tablet Take 2 tablets (10 mg total) by mouth daily as needed for moderate constipation. 10/14/21   Barrett, Shona MATSU, PA-C  Calcium  Carb-Cholecalciferol  (CALCIUM  600 + D PO) Take 1 tablet  by mouth daily.    [provider]  cholecalciferol  (VITAMIN D ) 25 MCG (1000 UNIT) tablet Take 1,000 Units by mouth daily.    [provider]  Continuous Blood Gluc Sensor (DEXCOM G7 SENSOR) MISC 1 Device by Does not apply route every 30 (thirty) days.    [provider]  docusate sodium  (COLACE) 100 MG capsule Take 2 capsules (200 mg total) by mouth daily. 10/15/21   Barrett, Rhonda G, PA-C  fexofenadine (ALLEGRA) 180 MG tablet Take 180 mg by mouth daily as needed for allergies.    [provider]  fluticasone  (FLONASE ) 50 MCG/ACT nasal spray Place 1 spray into both nostrils as needed for allergies.    [provider]  furosemide  (LASIX ) 40 MG tablet Take 40 mg by mouth daily as needed. 02/02/23   [provider]  glucose blood (ACCU-CHEK AVIVA PLUS) test strip Check sugar 4x daily 08/03/23   Shamleffer, Ibtehal Jaralla, MD  glucose blood test strip 1 each by Other route 4 (four) times daily -  before meals and at bedtime. Use as instructed    [provider]  insulin  aspart (NOVOLOG  FLEXPEN) 100 UNIT/ML FlexPen Max daily 45 units per scale 05/08/24   Shamleffer, Ibtehal Jaralla, MD  insulin  degludec (TRESIBA  FLEXTOUCH) 100 UNIT/ML FlexTouch Pen Inject 20 Units into the  skin daily. 05/08/24   Shamleffer, Ibtehal Jaralla, MD  Insulin  Pen Needle 32G X 4 MM MISC 1 Device by Does not apply route in the morning, at noon, in the evening, and at bedtime. 05/08/24   Shamleffer, Ibtehal Jaralla, MD  loperamide  (IMODIUM  A-D) 2 MG tablet Take 1-2 mg by mouth 4 (four) times daily as needed for diarrhea or loose stools.    [provider]  losartan  (COZAAR ) 25 MG tablet Take 1 tablet (25 mg total) by mouth daily. 07/14/24   Pietro Redell RAMAN, MD  meclizine  (ANTIVERT ) 25 MG tablet Take 1 tablet (25 mg total) by mouth 3 (three) times daily as needed for dizziness. 07/08/23   Curatolo, Adam, DO  meloxicam  (MOBIC ) 15 MG tablet TAKE 1 TABLET (15 MG  TOTAL) BY MOUTH DAILY. 07/08/24   McDonald, Juliene SAUNDERS, DPM  metoprolol  succinate (TOPROL  XL) 25 MG 24 hr tablet Take 1 tablet (25 mg total) by mouth daily. 07/12/21   Shlomo Wilbert SAUNDERS, MD  nitroGLYCERIN  (NITROSTAT ) 0.4 MG SL tablet PLACE 1 TABLET UNDER THE TONGUE EVERY 5 MINUTES AS NEEDED FOR CHEST PAIN. 02/26/24   Pietro Redell RAMAN, MD  omeprazole (PRILOSEC) 20 MG capsule Take 20 mg by mouth daily.    [provider]  OVER THE COUNTER MEDICATION Potassium supplement (unknown dosage per patient)    [provider]  vitamin B-12 (CYANOCOBALAMIN ) 1000 MCG tablet Take 1,000 mcg by mouth daily.    [provider]  vitamin C (ASCORBIC ACID) 500 MG tablet Take 500 mg by mouth daily.    [provider]  Zinc 50 MG CAPS Take 50 mg by mouth daily.    [provider]    Allergies: Codeine , Onion, and Reglan [metoclopramide]    Review of Systems  Genitourinary:  Positive for hematuria.  All other systems reviewed and are negative.   Updated Vital Signs BP (!) 169/71   Pulse 91   Temp 98 F (36.7 C) (Oral)   Resp 18   SpO2 99%   Physical Exam Vitals and nursing note reviewed. Exam conducted with a chaperone present.  Constitutional:      Appearance: Normal appearance.  HENT:     Head: Normocephalic and atraumatic.     Right Ear: External ear normal.     Left Ear: External ear normal.     Nose: Nose normal.     Mouth/Throat:     Mouth: Mucous membranes are moist.     Pharynx: Oropharynx is clear.  Eyes:     Extraocular Movements: Extraocular movements intact.     Conjunctiva/sclera: Conjunctivae normal.     Pupils: Pupils are equal, round, and reactive to light.  Cardiovascular:     Rate and Rhythm: Normal rate and regular rhythm.     Pulses: Normal pulses.     Heart sounds: Normal heart sounds.  Pulmonary:     Effort: Pulmonary effort is normal.     Breath sounds: Normal breath sounds.  Abdominal:     General: Abdomen is flat. Bowel sounds  are normal.     Palpations: Abdomen is soft.  Genitourinary:    Vagina: Normal.     Comments: No vaginal bleeding Musculoskeletal:        General: Normal range of motion.     Cervical back: Normal range of motion and neck supple.  Skin:    General: Skin is warm.     Capillary Refill: Capillary refill takes less than 2 seconds.  Neurological:  General: No focal deficit present.     Mental Status: She is alert and oriented to person, place, and time.  Psychiatric:        Mood and Affect: Mood normal.        Behavior: Behavior normal.     (all labs ordered are listed, but only abnormal results are displayed) Labs Reviewed  BASIC METABOLIC PANEL WITH GFR - Abnormal; Notable for the following components:      Result Value   Glucose, Bld 153 (*)    BUN 29 (*)    Creatinine, Ser 1.56 (*)    GFR, Estimated 35 (*)    All other components within normal limits  CBC WITH DIFFERENTIAL/PLATELET - Abnormal; Notable for the following components:   RBC 3.50 (*)    Hemoglobin 10.8 (*)    HCT 33.0 (*)    Platelets 149 (*)    All other components within normal limits  URINALYSIS, W/ REFLEX TO CULTURE (INFECTION SUSPECTED) - Abnormal; Notable for the following components:   Color, Urine ORANGE (*)    APPearance CLOUDY (*)    Hgb urine dipstick LARGE (*)    Protein, ur 100 (*)    Leukocytes,Ua LARGE (*)    Bacteria, UA MANY (*)    All other components within normal limits  URINE CULTURE    EKG: None  Radiology: No results found.   Procedures   Medications Ordered in the ED  cefTRIAXone  (ROCEPHIN ) 1 g in sodium chloride  0.9 % 100 mL IVPB (has no administration in time range)  sodium chloride  0.9 % bolus 500 mL (has no administration in time range)                                    Medical Decision Making Amount and/or Complexity of Data Reviewed Labs: ordered.  Risk Prescription drug management.   This patient presents to the ED for concern of hematuria, this  involves an extensive number of treatment options, and is a complaint that carries with it a high risk of complications and morbidity.  The differential diagnosis includes uti, kidney stone, vaginal bleeding   Co morbidities that complicate the patient evaluation  htn, dm1, cad, hld, breast cancer (now remission), gerd, and arthritis.   Additional history obtained:  Additional history obtained from epic chart review  Lab Tests:  I Ordered, and personally interpreted labs.  The pertinent results include:  cbc with hgb sl low at 10.8 (11.9 in March, but it's been in this range multiple times in the past); ua + hgb, lg LE, >50 RBCs and >50 WBCs; many bacteria; bmp with cr sl elevated at 1.56 (1.06 on 3/19)   Medicines ordered and prescription drug management:  I ordered medication including rocephin   for uti  Reevaluation of the patient after these medicines showed that the patient improved I have reviewed the patients home medicines and have made adjustments as needed   Test Considered:  Ct, but no pain   Critical Interventions:  abx  Problem List / ED Course:  Hemorrhagic cystitis:  Pt has no pain, so doubt kidney stone.  Pt started on abx.  She is stable for d/c.  Return if worse.   Reevaluation:  After the interventions noted above, I reevaluated the patient and found that they have :improved   Social Determinants of Health:  Lives at home   Dispostion:  After consideration of the diagnostic results  and the patients response to treatment, I feel that the patent would benefit from discharge with outpatient f/u.       Final diagnoses:  Hemorrhagic cystitis    ED Discharge Orders          Ordered    cephALEXin  (KEFLEX ) 500 MG capsule  3 times daily        08/07/24 1037    phenazopyridine (PYRIDIUM) 200 MG tablet  3 times daily        08/07/24 1037               Dean Clarity, MD 08/07/24 1039

## 2024-08-09 LAB — URINE CULTURE: Culture: >=100000 — AB

## 2024-08-10 ENCOUNTER — Telehealth (HOSPITAL_BASED_OUTPATIENT_CLINIC_OR_DEPARTMENT_OTHER): Payer: Self-pay | Admitting: *Deleted

## 2024-08-10 NOTE — Telephone Encounter (Signed)
 Post ED Visit - Positive Culture Follow-up  Culture report reviewed by antimicrobial stewardship pharmacist: [facility] []  Rankin Dee, Pharm.D. []  Venetia Gully, Pharm.D., BCPS AQ-ID []  Garrel Crews, Pharm.D., BCPS []  Almarie Lunger, Pharm.D., BCPS []  [place], [address].D., BCPS, AAHIVP []  Rosaline Bihari, Pharm.D., BCPS, AAHIVP []  Vernell Meier, PharmD, BCPS []  Latanya Hint, PharmD, BCPS []  Donald Medley, PharmD, BCPS []  Rocky Bold, PharmD []  Dorothyann Alert, PharmD, BCPS [x]  Dorn Buttner, PharmD  [facility] []  Rosaline Edison, PharmD []  Romona Bliss, PharmD []  Dolphus Roller, PharmD []  Veva Seip, Rph []  Vernell Daunt) Leonce, PharmD []  Eva Allis, PharmD []  Rosaline Millet, PharmD []  Iantha Batch, PharmD []  Arvin Gauss, PharmD []  Wanda Hasting, PharmD []  Ronal Rav, PharmD []  Rocky Slade, PharmD []  Bard Jeans, PharmD   Positive urine culture Treated with Cephalexin , organism sensitive to the same and no further patient follow-up is required at this time.  Molly Hill 08/10/2024, 3:34 PM

## 2024-08-17 ENCOUNTER — Other Ambulatory Visit: Payer: Self-pay | Admitting: Cardiology

## 2024-08-19 ENCOUNTER — Ambulatory Visit: Admitting: Podiatry

## 2024-08-19 ENCOUNTER — Encounter: Payer: Self-pay | Admitting: Podiatry

## 2024-08-19 ENCOUNTER — Ambulatory Visit (INDEPENDENT_AMBULATORY_CARE_PROVIDER_SITE_OTHER)

## 2024-08-19 VITALS — Ht 59.0 in | Wt 145.0 lb

## 2024-08-19 DIAGNOSIS — M84375G Stress fracture, left foot, subsequent encounter for fracture with delayed healing: Secondary | ICD-10-CM

## 2024-08-19 DIAGNOSIS — M84375A Stress fracture, left foot, initial encounter for fracture: Secondary | ICD-10-CM

## 2024-08-19 NOTE — Progress Notes (Signed)
  Subjective:  Patient ID: Molly Hill, female    DOB: 1950/11/05,  MRN: [id number]  Chief Complaint  Patient presents with   Fracture    RM 2 Patient is here fo f/u on fracture of metatarsal bone of the left foot. Pt states some stiffness in left hallux, unable to flex toe completely.    Discussed the use of AI scribe software for clinical note transcription with the patient, who gave verbal consent to proceed.  History of Present Illness Molly Hill is a 73 year old female who presents with left foot pain and inability to bend her toe.  She returns for follow-up notes no pain in the toe, it sits up off the ground, she has been using her boot      Objective:    Physical Exam VASCULAR: DP and PT pulse palpable. Foot is warm and well-perfused. Capillary fill time is brisk. DERMATOLOGIC: Normal skin turgor, texture, and temperature. No open lesions, rashes, or ulcerations. NEUROLOGIC: Normal sensation to light touch and pressure. No paresthesias. ORTHOPEDIC: Mild edema over first metatarsal no pain to palpation today   No images are attached to the encounter.    Results RADIOLOGY Left foot radiograph: Increasing bone callus formation there is shortening of the metatarsal from her immediate fracture films 2 months ago   Assessment:   1. Stress fracture of metatarsal bone of left foot with delayed healing, subsequent encounter      Plan:  Patient was evaluated and treated and all questions answered.  Assessment and Plan Assessment & Plan Left first metatarsal stress fracture We discussed the fracture has impacted some and caused shortening of the first ray.  This is likely contributing to the digital deformity.  Long-term if this is symptomatic may need reconstruction with bone graft.  Will allow bone to continue to heal she does have some delayed healing I will see her back in 1 month for new x-rays advised her to rest as much as possible no weightbearing  outside of the boot.  Minimize dancing or impact.  If not improved by next visit plan for bone marrow growth stimulator.       Return in about 1 month (around 09/18/2024) for fracture follow up (new left foot xrays).

## 2024-09-23 ENCOUNTER — Ambulatory Visit: Admitting: Podiatry

## 2024-09-25 ENCOUNTER — Ambulatory Visit: Admitting: Podiatry

## 2024-09-25 ENCOUNTER — Ambulatory Visit (INDEPENDENT_AMBULATORY_CARE_PROVIDER_SITE_OTHER)

## 2024-09-25 ENCOUNTER — Encounter

## 2024-09-25 VITALS — Ht 59.0 in | Wt 145.0 lb

## 2024-09-25 DIAGNOSIS — S92315K Nondisplaced fracture of first metatarsal bone, left foot, subsequent encounter for fracture with nonunion: Secondary | ICD-10-CM | POA: Diagnosis not present

## 2024-09-25 DIAGNOSIS — M84375G Stress fracture, left foot, subsequent encounter for fracture with delayed healing: Secondary | ICD-10-CM

## 2024-09-25 NOTE — Patient Instructions (Signed)
 Once you receive your bone stimulator, the position for it will be 6 cm behind the base of the toenail   Call [facility] to schedule your CT at the below locations.  Please allow at least 1 business day after your visit to process the referral.  It may take longer depending on approval from insurance.  Please let me know if you have issues or problems scheduling the CT   [facility] [place] [phone] [address] [place] [address] [place], [place] [address]  [facility] [phone] [address] [place], [place] [address]

## 2024-10-01 ENCOUNTER — Encounter: Payer: Self-pay | Admitting: Podiatry

## 2024-10-01 NOTE — Progress Notes (Signed)
°  Subjective:  Patient ID: Molly Hill, female    DOB: 1951/07/15,  MRN: [id number]  Chief Complaint  Patient presents with   Fracture    RM 8 Patient is here to f/u on left foot fracture. Pt states no pain or discomfort and she is ready to transition to a regular shoe.    Discussed the use of AI scribe software for clinical note transcription with the patient, who gave verbal consent to proceed.  History of Present Illness Molly Hill is a 73 year old female who presents with left foot pain and inability to bend her toe.  She returns for follow-up notes no pain in the toe, it sits up off the ground, she has been using her boot      Objective:    Physical Exam VASCULAR: DP and PT pulse palpable. Foot is warm and well-perfused. Capillary fill time is brisk. DERMATOLOGIC: Normal skin turgor, texture, and temperature. No open lesions, rashes, or ulcerations. NEUROLOGIC: Normal sensation to light touch and pressure. No paresthesias. ORTHOPEDIC: Mild edema over first metatarsal no pain to palpation today   No images are attached to the encounter.    Results RADIOLOGY Left foot radiograph: Persistent fracture nonunion with unchanged alignment, fracture gap first metatarsal less than 1 cm   Assessment:   1. Closed nondisplaced fracture of first metatarsal bone of left foot with nonunion, subsequent encounter      Plan:  Patient was evaluated and treated and all questions answered.  Assessment and Plan Assessment & Plan Left first metatarsal stress fracture Still relatively unchanged alignment and no major increase in fracture healing.  I recommend a CT scan to evaluate the percentage of fracture healing.  I also recommended noninvasive bone marrow stimulation and referral for an ultrasound bone growth stimulator will be placed.  CT scan ordered from [facility] imaging.  Follow-up in 6 weeks for new radiographs.  Continue boot use for now.       Return in  about 6 weeks (around 11/06/2024) for fracture follow up (new left foot xrays).

## 2024-10-03 ENCOUNTER — Other Ambulatory Visit

## 2024-10-21 ENCOUNTER — Telehealth: Payer: Self-pay | Admitting: *Deleted

## 2024-10-21 LAB — LAB REPORT - SCANNED
Albumin, Urine POC: 7.79
Creatinine, POC: 247 mg/dL
Microalb Creat Ratio: 31.5

## 2024-10-21 NOTE — Telephone Encounter (Signed)
 Spoke with patient. She stated that she would appreciate a sooner apt with Dr. Rennie to further discuss. She does not want a mychart. She prefers a face to face visit.  Heron, could you reach out to the patient and schedule a sooner apt. Thanks.

## 2024-10-21 NOTE — Telephone Encounter (Signed)
 Caller verified using pt's full name and dob prior to discussing PHI    Patient requires a bone growth stimulator procedure with an hgh injection. She wants to make sure this does not increase her risk factors for her breast cancer reoccurring. She sees Dr. Silva in podiatry and needs to set up this procedure if ok with Dr. Rennie. H/o of closed nondisplaced fracture of her left foot.

## 2024-10-23 ENCOUNTER — Inpatient Hospital Stay: Attending: Internal Medicine

## 2024-10-23 ENCOUNTER — Inpatient Hospital Stay (HOSPITAL_BASED_OUTPATIENT_CLINIC_OR_DEPARTMENT_OTHER): Admitting: Internal Medicine

## 2024-10-23 ENCOUNTER — Encounter: Payer: Self-pay | Admitting: Internal Medicine

## 2024-10-23 VITALS — BP 148/62 | HR 74 | Temp 97.8°F | Resp 19 | Ht 59.0 in | Wt 151.5 lb

## 2024-10-23 DIAGNOSIS — Z87891 Personal history of nicotine dependence: Secondary | ICD-10-CM | POA: Insufficient documentation

## 2024-10-23 DIAGNOSIS — I129 Hypertensive chronic kidney disease with stage 1 through stage 4 chronic kidney disease, or unspecified chronic kidney disease: Secondary | ICD-10-CM | POA: Diagnosis not present

## 2024-10-23 DIAGNOSIS — C50811 Malignant neoplasm of overlapping sites of right female breast: Secondary | ICD-10-CM | POA: Diagnosis not present

## 2024-10-23 DIAGNOSIS — D696 Thrombocytopenia, unspecified: Secondary | ICD-10-CM | POA: Diagnosis not present

## 2024-10-23 DIAGNOSIS — Z803 Family history of malignant neoplasm of breast: Secondary | ICD-10-CM | POA: Insufficient documentation

## 2024-10-23 DIAGNOSIS — Z800 Family history of malignant neoplasm of digestive organs: Secondary | ICD-10-CM | POA: Diagnosis not present

## 2024-10-23 DIAGNOSIS — E1022 Type 1 diabetes mellitus with diabetic chronic kidney disease: Secondary | ICD-10-CM | POA: Insufficient documentation

## 2024-10-23 DIAGNOSIS — Z853 Personal history of malignant neoplasm of breast: Secondary | ICD-10-CM | POA: Insufficient documentation

## 2024-10-23 DIAGNOSIS — Z171 Estrogen receptor negative status [ER-]: Secondary | ICD-10-CM | POA: Diagnosis not present

## 2024-10-23 DIAGNOSIS — N183 Chronic kidney disease, stage 3 unspecified: Secondary | ICD-10-CM | POA: Diagnosis not present

## 2024-10-23 DIAGNOSIS — D649 Anemia, unspecified: Secondary | ICD-10-CM | POA: Insufficient documentation

## 2024-10-23 LAB — CBC WITH DIFFERENTIAL (CANCER CENTER ONLY)
Abs Immature Granulocytes: 0.03 K/uL (ref 0.00–0.07)
Basophils Absolute: 0 K/uL (ref 0.0–0.1)
Basophils Relative: 0 %
Eosinophils Absolute: 0.4 K/uL (ref 0.0–0.5)
Eosinophils Relative: 5 %
HCT: 35.2 % — ABNORMAL LOW (ref 36.0–46.0)
Hemoglobin: 11.5 g/dL — ABNORMAL LOW (ref 12.0–15.0)
Immature Granulocytes: 0 %
Lymphocytes Relative: 22 %
Lymphs Abs: 1.9 K/uL (ref 0.7–4.0)
MCH: 31.2 pg (ref 26.0–34.0)
MCHC: 32.7 g/dL (ref 30.0–36.0)
MCV: 95.4 fL (ref 80.0–100.0)
Monocytes Absolute: 1.1 K/uL — ABNORMAL HIGH (ref 0.1–1.0)
Monocytes Relative: 12 %
Neutro Abs: 5.1 K/uL (ref 1.7–7.7)
Neutrophils Relative %: 61 %
Platelet Count: 156 K/uL (ref 150–400)
RBC: 3.69 MIL/uL — ABNORMAL LOW (ref 3.87–5.11)
RDW: 12.8 % (ref 11.5–15.5)
WBC Count: 8.5 K/uL (ref 4.0–10.5)
nRBC: 0 % (ref 0.0–0.2)

## 2024-10-23 LAB — CMP (CANCER CENTER ONLY)
ALT: 12 U/L (ref 0–44)
AST: 25 U/L (ref 15–41)
Albumin: 4.2 g/dL (ref 3.5–5.0)
Alkaline Phosphatase: 101 U/L (ref 38–126)
Anion gap: 11 (ref 5–15)
BUN: 32 mg/dL — ABNORMAL HIGH (ref 8–23)
CO2: 29 mmol/L (ref 22–32)
Calcium: 10.8 mg/dL — ABNORMAL HIGH (ref 8.9–10.3)
Chloride: 98 mmol/L (ref 98–111)
Creatinine: 1.96 mg/dL — ABNORMAL HIGH (ref 0.44–1.00)
GFR, Estimated: 26 mL/min — ABNORMAL LOW
Glucose, Bld: 138 mg/dL — ABNORMAL HIGH (ref 70–99)
Potassium: 4.5 mmol/L (ref 3.5–5.1)
Sodium: 138 mmol/L (ref 135–145)
Total Bilirubin: 0.4 mg/dL (ref 0.0–1.2)
Total Protein: 7.3 g/dL (ref 6.5–8.1)

## 2024-10-23 NOTE — Progress Notes (Signed)
 [facility] CONSULT NOTE  Patient Care Team: Dwight Trula SQUIBB, MD as PCP - General (Internal Medicine) Wonda Sharper, MD as PCP - Structural Heart (Cardiology) Pietro Redell RAMAN, MD as PCP - Cardiology (Cardiology) Rennie Cindy SAUNDERS, MD as Consulting Physician (Hematology and Oncology)  CHIEF COMPLAINTS/PURPOSE OF CONSULTATION: breast cancer  #  Oncology History Overview Note  #September 2016-multifocal right breast cancer triple negative [ER/PR HER-2 negative; [facility]]-PET-no metastatic disease; dose dense Adriamycin followed by weekly Taxol.  November 05, 2013-right mastectomy- [id number]-complete pathologic response.  status post adjuvant right ax.radiation Left breast cancer- as pt/reocds s/p mastectomy-no malignancy noted.    # DM- on insulin  [since 1972]  # SURVIVORSHIP:   # GENETICS: NEG as per pt.   DIAGNOSIS: Right breast cancer  STAGE: early stage        ;  GOALS: cure  CURRENT/MOST RECENT THERAPY : Surveillance      Carcinoma of overlapping sites of right breast in female, estrogen receptor negative (HCC)  12/24/2019 Initial Diagnosis   Carcinoma of overlapping sites of right breast in female, estrogen receptor negative (HCC)      HISTORY OF PRESENTING ILLNESS: Ambulating independently.  Alone.  Molly Hill 74 y.o.  female above history of bilateral triple negative breast cancer is here for follow-up.  Discussed the use of AI scribe software for clinical note transcription with the patient, who gave verbal consent to proceed.  History of Present Illness   Molly Hill is a 74 year old female with a history of right breast cancer in remission who presents for follow-up regarding a non-healing foot stress fracture and recent laboratory abnormalities.  She developed a stress fracture in her foot, initially misattributed to arthritis and treated with a cortisone injection prior to imaging confirmation. She has  been managed with a walking boot for approximately 24 weeks, remains ambulatory, and is able to walk at home without pain. Her foot specialist has discussed possible human growth hormone injections to promote bone healing, pending further imaging. She expresses concern regarding the cost and potential oncologic risk of hormone therapy, and seeks input regarding its safety given her cancer history.  Recent laboratory studies reveal mild hypercalcemia (calcium  10.8 mg/dL) and worsening renal function (creatinine elevated, eGFR 26). She is currently taking calcium  supplements and questions their necessity in the context of hypercalcemia and chronic kidney disease.       Review of Systems  Constitutional:  Positive for malaise/fatigue. Negative for chills, diaphoresis, fever and weight loss.  HENT:  Negative for nosebleeds and sore throat.   Eyes:  Negative for double vision.  Respiratory:  Negative for cough, hemoptysis, sputum production, shortness of breath and wheezing.   Cardiovascular:  Negative for chest pain, palpitations, orthopnea and leg swelling.  Gastrointestinal:  Negative for abdominal pain, blood in stool, constipation, diarrhea, heartburn, melena, nausea and vomiting.  Genitourinary:  Negative for dysuria, frequency and urgency.  Musculoskeletal:  Positive for joint pain. Negative for back pain.  Skin: Negative.  Negative for itching and [place].  Neurological:  Negative for dizziness, tingling, focal weakness, weakness and headaches.  Endo/Heme/Allergies:  Does not bruise/bleed easily.  Psychiatric/Behavioral:  Negative for depression. The patient is not nervous/anxious and does not have insomnia.      MEDICAL HISTORY:  Past Medical History:  Diagnosis Date   Alopecia    Anemia    Arthritis    Bicuspid aortic valve    mild to moderate AS by echo 2022  Chronic diastolic (congestive) heart failure (HCC)    Coronary arteriosclerosis in native artery    s/p PCI of LAD in 2013 &  NSTEMI with staged DES to pLAD (10/2021)   DM (diabetes mellitus), type 1 (HCC)    GERD (gastroesophageal reflux disease)    History of breast cancer    Estrogen receptor negative. s/p chemo   Hyperglycemia    Hyperlipidemia    Hypertension    Mitral regurgitation    mild to moderate by echo 2020   Myocardial infarction [facility]) 10/09/2021   S/P TAVR (transcatheter aortic valve replacement) 11/08/2021   s/p TAVR with a 23 mm Edwards S3UR via the TF approach by Dr. Wonda and Dr. Lucas   Tendinitis of left wrist     SURGICAL HISTORY: Past Surgical History:  Procedure Laterality Date   ANKLE SURGERY Bilateral    CATARACT EXTRACTION, BILATERAL     CHOLECYSTECTOMY     CORONARY PRESSURE/FFR STUDY N/A 10/21/2021   Procedure: INTRAVASCULAR PRESSURE WIRE/FFR STUDY;  Surgeon: Wonda Sharper, MD;  Location: [facility] INVASIVE CV LAB;  Service: Cardiovascular;  Laterality: N/A;   CORONARY STENT INTERVENTION     CORONARY STENT INTERVENTION N/A 10/21/2021   Procedure: CORONARY STENT INTERVENTION;  Surgeon: Wonda Sharper, MD;  Location: [facility] INVASIVE CV LAB;  Service: Cardiovascular;  Laterality: N/A;   CORONARY/GRAFT ACUTE MI REVASCULARIZATION N/A 10/10/2021   Procedure: CORONARY/GRAFT ACUTE MI REVASCULARIZATION;  Surgeon: Court Dorn PARAS, MD;  Location: [facility] INVASIVE CV LAB;  Service: Cardiovascular;  Laterality: N/A;   CTR     ELBOW SURGERY     endovenuous laser ablation     HAND SURGERY     BOTH THUMBS AND MIDDLE FINGERS   INTRAOPERATIVE TRANSTHORACIC ECHOCARDIOGRAM N/A 11/08/2021   Procedure: INTRAOPERATIVE TRANSTHORACIC ECHOCARDIOGRAM;  Surgeon: Wonda Sharper, MD;  Location: [facility] INVASIVE CV LAB;  Service: Open Heart Surgery;  Laterality: N/A;   LEFT BREAST MASTECTOMY Bilateral    MASCULAR  REPAIR     PORT-A-CATH REMOVAL     PORTACATH PLACEMENT     RIGHT MODIFIED MASTECTOMY Right    saline intravenous legs     sclero therapy     TRANSCATHETER AORTIC VALVE REPLACEMENT, TRANSFEMORAL N/A  11/08/2021   Procedure: TRANSCATHETER AORTIC VALVE REPLACEMENT, TRANSFEMORAL;  Surgeon: Wonda Sharper, MD;  Location: [facility] INVASIVE CV LAB;  Service: Open Heart Surgery;  Laterality: N/A;   UPPER LIP SURGERY      SOCIAL HISTORY: Social History   Socioeconomic History   Marital status: Widowed    Spouse name: Not on file   Number of children: Not on file   Years of education: Not on file   Highest education level: Not on file  Occupational History   Not on file  Tobacco Use   Smoking status: Former    Current packs/day: 0.00    Types: Cigarettes    Start date: 08/14/1976    Quit date: 08/15/1979    Years since quitting: 45.2   Smokeless tobacco: Never   Tobacco comments:    1 pack would last a week.  Vaping Use   Vaping status: Never Used  Substance and Sexual Activity   Alcohol use: Never   Drug use: Never   Sexual activity: Never  Other Topics Concern   Not on file  Social History Narrative   Not on file   Social Drivers of Health   Tobacco Use: Medium Risk (10/23/2024)   Patient History    Smoking Tobacco Use: Former    Smokeless  Tobacco Use: Never    Passive Exposure: Not on file  Financial Resource Strain: Not on file  Food Insecurity: No Food Insecurity (10/23/2024)   Epic    Worried About Programme Researcher, Broadcasting/film/video in the Last Year: Never true    Ran Out of Food in the Last Year: Never true  Transportation Needs: No Transportation Needs (10/23/2024)   Epic    Lack of Transportation (Medical): No    Lack of Transportation (Non-Medical): No  Physical Activity: Not on file  Stress: Not on file  Social Connections: Not on file  Intimate Partner Violence: Not At Risk (10/23/2024)   Epic    Fear of Current or Ex-Partner: No    Emotionally Abused: No    Physically Abused: No    Sexually Abused: No  Depression (PHQ2-9): Low Risk (10/23/2024)   Depression (PHQ2-9)    PHQ-2 Score: 0  Alcohol Screen: Not on file  Housing: Low Risk (10/23/2024)   Epic    Unable to Pay for  Housing in the Last Year: No    Number of Times Moved in the Last Year: 0    Homeless in the Last Year: No  Utilities: Not At Risk (10/23/2024)   Epic    Threatened with loss of utilities: No  Health Literacy: Not on file    FAMILY HISTORY: Family History  Problem Relation Age of Onset   Heart attack Mother    CAD Father        CABG   Diabetes Father    Stroke Father    Colon cancer Father        18s   Diabetes Brother    Diabetes Paternal Grandfather    Heart attack Paternal Grandfather    Breast cancer Maternal Aunt        56s    ALLERGIES:  is allergic to codeine , onion, and reglan [metoclopramide].  MEDICATIONS:  Current Outpatient Medications  Medication Sig Dispense Refill   acetaminophen  (TYLENOL ) 500 MG tablet Take 1,000 mg by mouth every 6 (six) hours as needed for moderate pain or headache.     amLODipine  (NORVASC ) 2.5 MG tablet TAKE 1 TABLET BY MOUTH EVERY DAY 90 tablet 2   aspirin  EC 81 MG tablet Take 81 mg by mouth at bedtime.     atorvastatin  (LIPITOR ) 80 MG tablet TAKE 1 TABLET BY MOUTH EVERYDAY AT BEDTIME 90 tablet 3   Biotin  w/ Vitamins C & E (HAIR/SKIN/NAILS PO) Take 1 capsule by mouth daily.     bisacodyl  (DULCOLAX) 5 MG EC tablet Take 2 tablets (10 mg total) by mouth daily as needed for moderate constipation. 30 tablet 0   Calcium  Carb-Cholecalciferol  (CALCIUM  600 + D PO) Take 1 tablet by mouth daily.     cholecalciferol  (VITAMIN D ) 25 MCG (1000 UNIT) tablet Take 1,000 Units by mouth daily.     Continuous Blood Gluc Sensor (DEXCOM G7 SENSOR) MISC 1 Device by Does not apply route every 30 (thirty) days.     docusate sodium  (COLACE) 100 MG capsule Take 2 capsules (200 mg total) by mouth daily. 10 capsule 0   fexofenadine (ALLEGRA) 180 MG tablet Take 180 mg by mouth daily as needed for allergies.     fluticasone  (FLONASE ) 50 MCG/ACT nasal spray Place 1 spray into both nostrils as needed for allergies.     furosemide  (LASIX ) 40 MG tablet Take 40 mg by mouth  daily as needed.     insulin  aspart (NOVOLOG  FLEXPEN) 100 UNIT/ML FlexPen Max  daily 45 units per scale 45 mL 3   insulin  degludec (TRESIBA  FLEXTOUCH) 100 UNIT/ML FlexTouch Pen Inject 20 Units into the skin daily. 30 mL 3   loperamide  (IMODIUM  A-D) 2 MG tablet Take 1-2 mg by mouth 4 (four) times daily as needed for diarrhea or loose stools.     losartan  (COZAAR ) 25 MG tablet Take 1 tablet (25 mg total) by mouth daily. 90 tablet 3   meclizine  (ANTIVERT ) 25 MG tablet Take 1 tablet (25 mg total) by mouth 3 (three) times daily as needed for dizziness. 30 tablet 0   meloxicam  (MOBIC ) 15 MG tablet TAKE 1 TABLET (15 MG TOTAL) BY MOUTH DAILY. 30 tablet 3   metoprolol  succinate (TOPROL  XL) 25 MG 24 hr tablet Take 1 tablet (25 mg total) by mouth daily. 90 tablet 3   nitroGLYCERIN  (NITROSTAT ) 0.4 MG SL tablet PLACE 1 TABLET UNDER THE TONGUE EVERY 5 MINUTES AS NEEDED FOR CHEST PAIN. 25 tablet 2   omeprazole (PRILOSEC) 20 MG capsule Take 20 mg by mouth daily.     vitamin B-12 (CYANOCOBALAMIN ) 1000 MCG tablet Take 1,000 mcg by mouth daily.     vitamin C (ASCORBIC ACID) 500 MG tablet Take 500 mg by mouth daily.     Zinc 50 MG CAPS Take 50 mg by mouth daily.     cephALEXin  (KEFLEX ) 500 MG capsule Take 1 capsule (500 mg total) by mouth 3 (three) times daily. (Patient not taking: Reported on 10/23/2024) 28 capsule 0   glucose blood (ACCU-CHEK AVIVA PLUS) test strip Check sugar 4x daily 400 each 2   glucose blood test strip 1 each by Other route 4 (four) times daily -  before meals and at bedtime. Use as instructed     Insulin  Pen Needle 32G X 4 MM MISC 1 Device by Does not apply route in the morning, at noon, in the evening, and at bedtime. 400 each 3   OVER THE COUNTER MEDICATION Potassium supplement (unknown dosage per patient)     phenazopyridine  (PYRIDIUM ) 200 MG tablet Take 1 tablet (200 mg total) by mouth 3 (three) times daily. 6 tablet 0   No current facility-administered medications for this visit.       SABRA  PHYSICAL EXAMINATION: ECOG PERFORMANCE STATUS: 0 - Asymptomatic  Vitals:   10/23/24 1101 10/23/24 1103  BP: (!) 147/68 (!) 148/62  Pulse: 74   Resp: 19   Temp: 97.8 F (36.6 C)   SpO2: 98%     Filed Weights   10/23/24 1101  Weight: 151 lb 8 oz (68.7 kg)     Physical Exam Constitutional:      Comments: Ambulating independently.  Alone.  HENT:     Head: Normocephalic and atraumatic.     Mouth/Throat:     Pharynx: No oropharyngeal exudate.  Eyes:     Pupils: Pupils are equal, round, and reactive to light.  Cardiovascular:     Rate and Rhythm: Normal rate and regular rhythm.  Pulmonary:     Effort: No respiratory distress.     Breath sounds: No wheezing.  Abdominal:     General: Bowel sounds are normal. There is no distension.     Palpations: Abdomen is soft. There is no mass.     Tenderness: There is no abdominal tenderness. There is no guarding or rebound.  Musculoskeletal:        General: No tenderness. Normal range of motion.     Cervical back: Normal range of motion and neck supple.  Skin:    General: Skin is warm.     Comments: Bilateral mastectomies noted.  No lumps or bumps noted.  Positive chest wall telangiectasia.  Neurological:     Mental Status: She is alert and oriented to person, place, and time.  Psychiatric:        Mood and Affect: Affect normal.      LABORATORY DATA:  I have reviewed the data as listed Lab Results  Component Value Date   WBC 8.5 10/23/2024   HGB 11.5 (L) 10/23/2024   HCT 35.2 (L) 10/23/2024   MCV 95.4 10/23/2024   PLT 156 10/23/2024   Recent Labs    01/02/24 1006 06/25/24 1521 08/07/24 1002 10/23/24 1038  NA 136  --  138 138  K 4.4  --  4.6 4.5  CL 104  --  102 98  CO2 26  --  27 29  GLUCOSE 86  --  153* 138*  BUN 28*  --  29* 32*  CREATININE 1.06*  --  1.56* 1.96*  CALCIUM  8.7* 9.2 9.7 10.8*  GFRNONAA 55*  --  35* 26*  PROT 7.0  --   --  7.3  ALBUMIN 3.9  --   --  4.2  AST 24  --   --  25  ALT 15  --    --  12  ALKPHOS 85  --   --  101  BILITOT 0.6  --   --  0.4    RADIOGRAPHIC STUDIES: I have personally reviewed the radiological images as listed and agreed with the findings in the report. DG Foot Complete Left Result Date: 09/25/2024 Please see detailed radiograph report in office note.    ASSESSMENT & PLAN:   Carcinoma of overlapping sites of right breast in female, estrogen receptor negative (HCC) # Bilateral breast cancer triple negative [September 2016]-s/p BIL Mastectomy. No evidence of recurrence.  Continue surveillance.  No mammograms.  Stable.   # Anemia mild hemoglobin 11-12/? Sec to CKD- III- Stable.   # Intermittent thrombocytopenia: -slightly low at 145 [normal is 150].  Question ITP versus others- Stable.   # Bone density: [endocrinologist]- Relcast q 12 months.  Stable.   # CKD- stage III- GFR 50 [DM-Endo/HTN]-discussed with patient the importance of optimal control of blood pressure/blood glucose levels; calcium  10.8- ? Etiology- STOP+ vitD- defer PCP for re-starting the Ca+vit D  # Foot fracture needing-growth factor as per orthopedic/podiatry-okay to proceed with oncology standpoint.  # # CAD/MI [dec, 2022]; TAVR [[place]- Jan 2023]-  Stable.   # DISPOSITION:  # Follow up in 12 months MD; labs- cbc/cmp; - Dr.B   All questions were answered. The patient knows to call the clinic with any problems, questions or concerns.    Molly Samons R Jaquay Posthumus, MD 10/24/2024 9:00 AM

## 2024-10-23 NOTE — Assessment & Plan Note (Addendum)
#   Bilateral breast cancer triple negative [September 2016]-s/p BIL Mastectomy. No evidence of recurrence.  Continue surveillance.  No mammograms.  Stable.   # Anemia mild hemoglobin 11-12/? Sec to CKD- III- Stable.   # Intermittent thrombocytopenia: -slightly low at 145 [normal is 150].  Question ITP versus others- Stable.   # Bone density: [endocrinologist]- Relcast q 12 months.  Stable.   # CKD- stage III- GFR 50 [DM-Endo/HTN]-discussed with patient the importance of optimal control of blood pressure/blood glucose levels; calcium  10.8- ? Etiology- STOP+ vitD- defer PCP for re-starting the Ca+vit D  # Foot fracture needing-growth factor as per orthopedic/podiatry-okay to proceed with oncology standpoint.  # # CAD/MI [dec, 2022]; TAVR [[place]- Jan 2023]-  Stable.   # DISPOSITION:  # Follow up in 12 months MD; labs- cbc/cmp; - Dr.B

## 2024-10-23 NOTE — Progress Notes (Signed)
 Patient has a few new & acute concerns to address during today's visit.

## 2024-10-29 ENCOUNTER — Inpatient Hospital Stay: Admission: RE | Admit: 2024-10-29 | Discharge: 2024-10-29 | Attending: Podiatry

## 2024-10-29 DIAGNOSIS — S92315K Nondisplaced fracture of first metatarsal bone, left foot, subsequent encounter for fracture with nonunion: Secondary | ICD-10-CM

## 2024-11-01 ENCOUNTER — Other Ambulatory Visit: Payer: Self-pay | Admitting: Podiatry

## 2024-11-10 ENCOUNTER — Ambulatory Visit: Admitting: Internal Medicine

## 2024-11-12 ENCOUNTER — Ambulatory Visit: Payer: Self-pay | Admitting: Podiatry

## 2024-11-13 ENCOUNTER — Ambulatory Visit: Admitting: Podiatry

## 2024-12-02 ENCOUNTER — Ambulatory Visit: Admitting: Podiatry

## 2024-12-31 ENCOUNTER — Ambulatory Visit: Admitting: Internal Medicine

## 2024-12-31 ENCOUNTER — Other Ambulatory Visit

## 2025-10-23 ENCOUNTER — Inpatient Hospital Stay: Admitting: Internal Medicine

## 2025-10-23 ENCOUNTER — Inpatient Hospital Stay
